# Patient Record
Sex: Male | Born: 1960 | Race: White | Hispanic: No | Marital: Single | Smoking: Former smoker
Health system: Southern US, Community
[De-identification: ages and names within clinical notes are randomized; demographics above are authoritative.]

## PROBLEM LIST (undated history)

## (undated) DIAGNOSIS — K635 Polyp of colon: Secondary | ICD-10-CM

## (undated) DIAGNOSIS — A630 Anogenital (venereal) warts: Secondary | ICD-10-CM

## (undated) DIAGNOSIS — R002 Palpitations: Secondary | ICD-10-CM

## (undated) DIAGNOSIS — J189 Pneumonia, unspecified organism: Secondary | ICD-10-CM

## (undated) DIAGNOSIS — M1990 Unspecified osteoarthritis, unspecified site: Secondary | ICD-10-CM

## (undated) DIAGNOSIS — F329 Major depressive disorder, single episode, unspecified: Secondary | ICD-10-CM

## (undated) DIAGNOSIS — I10 Essential (primary) hypertension: Secondary | ICD-10-CM

## (undated) DIAGNOSIS — G629 Polyneuropathy, unspecified: Secondary | ICD-10-CM

## (undated) DIAGNOSIS — E079 Disorder of thyroid, unspecified: Secondary | ICD-10-CM

## (undated) DIAGNOSIS — T7840XA Allergy, unspecified, initial encounter: Secondary | ICD-10-CM

## (undated) DIAGNOSIS — F32A Depression, unspecified: Secondary | ICD-10-CM

## (undated) DIAGNOSIS — M25569 Pain in unspecified knee: Secondary | ICD-10-CM

## (undated) DIAGNOSIS — F419 Anxiety disorder, unspecified: Secondary | ICD-10-CM

## (undated) DIAGNOSIS — S8990XA Unspecified injury of unspecified lower leg, initial encounter: Secondary | ICD-10-CM

## (undated) DIAGNOSIS — Z9289 Personal history of other medical treatment: Secondary | ICD-10-CM

## (undated) DIAGNOSIS — K219 Gastro-esophageal reflux disease without esophagitis: Secondary | ICD-10-CM

## (undated) HISTORY — PX: HERNIA REPAIR: SHX51

## (undated) HISTORY — DX: Disorder of thyroid, unspecified: E07.9

## (undated) HISTORY — PX: THYROIDECTOMY: SHX17

## (undated) HISTORY — PX: TOTAL HIP ARTHROPLASTY: SHX124

## (undated) HISTORY — PX: WRIST SURGERY: SHX841

## (undated) HISTORY — DX: Allergy, unspecified, initial encounter: T78.40XA

## (undated) HISTORY — PX: OTHER SURGICAL HISTORY: SHX169

## (undated) HISTORY — PX: KNEE ARTHROSCOPY: SHX127

## (undated) HISTORY — PX: COLONOSCOPY: SHX174

## (undated) HISTORY — PX: SHOULDER ARTHROSCOPY W/ ROTATOR CUFF REPAIR: SHX2400

## (undated) HISTORY — PX: ROTATOR CUFF REPAIR: SHX139

## (undated) HISTORY — DX: Essential (primary) hypertension: I10

---

## 2004-11-28 ENCOUNTER — Ambulatory Visit (HOSPITAL_COMMUNITY): Admission: RE | Admit: 2004-11-28 | Discharge: 2004-11-28 | Payer: Self-pay | Admitting: General Surgery

## 2011-08-28 ENCOUNTER — Other Ambulatory Visit: Payer: Self-pay | Admitting: Family Medicine

## 2011-08-28 DIAGNOSIS — Z801 Family history of malignant neoplasm of trachea, bronchus and lung: Secondary | ICD-10-CM

## 2011-08-28 DIAGNOSIS — J984 Other disorders of lung: Secondary | ICD-10-CM

## 2011-08-28 DIAGNOSIS — F17200 Nicotine dependence, unspecified, uncomplicated: Secondary | ICD-10-CM

## 2011-09-01 ENCOUNTER — Ambulatory Visit
Admission: RE | Admit: 2011-09-01 | Discharge: 2011-09-01 | Disposition: A | Discharge status: Home / Self Care | Payer: 59 | Source: Ambulatory Visit | Attending: Family Medicine | Admitting: Family Medicine

## 2011-09-01 DIAGNOSIS — J984 Other disorders of lung: Secondary | ICD-10-CM

## 2011-09-01 DIAGNOSIS — F17200 Nicotine dependence, unspecified, uncomplicated: Secondary | ICD-10-CM

## 2011-09-01 DIAGNOSIS — Z801 Family history of malignant neoplasm of trachea, bronchus and lung: Secondary | ICD-10-CM

## 2011-09-01 MED ORDER — IOHEXOL 300 MG/ML  SOLN
75.0000 mL | Freq: Once | INTRAMUSCULAR | Status: AC | PRN
  Administered 2011-09-01: 75 mL via INTRAVENOUS

## 2012-01-20 ENCOUNTER — Other Ambulatory Visit: Payer: Self-pay | Admitting: Physician Assistant

## 2012-02-21 ENCOUNTER — Other Ambulatory Visit: Payer: Self-pay | Admitting: Physician Assistant

## 2012-02-22 ENCOUNTER — Other Ambulatory Visit: Payer: Self-pay | Admitting: Family Medicine

## 2012-02-22 MED ORDER — LISINOPRIL 10 MG PO TABS: 10.0000 mg | ORAL_TABLET | Freq: Every day | ORAL | Status: DC

## 2012-03-31 ENCOUNTER — Other Ambulatory Visit: Payer: Self-pay | Admitting: Physician Assistant

## 2012-04-08 ENCOUNTER — Encounter: Payer: Self-pay | Admitting: Family Medicine

## 2012-04-08 ENCOUNTER — Ambulatory Visit (INDEPENDENT_AMBULATORY_CARE_PROVIDER_SITE_OTHER): Payer: 59 | Admitting: Family Medicine

## 2012-04-08 DIAGNOSIS — T148 Other injury of unspecified body region: Secondary | ICD-10-CM

## 2012-04-08 DIAGNOSIS — J301 Allergic rhinitis due to pollen: Secondary | ICD-10-CM

## 2012-04-08 DIAGNOSIS — E039 Hypothyroidism, unspecified: Secondary | ICD-10-CM

## 2012-04-08 DIAGNOSIS — W57XXXA Bitten or stung by nonvenomous insect and other nonvenomous arthropods, initial encounter: Secondary | ICD-10-CM

## 2012-04-08 DIAGNOSIS — I10 Essential (primary) hypertension: Secondary | ICD-10-CM

## 2012-04-08 MED ORDER — SYNTHROID 175 MCG PO TABS: 175.0000 ug | ORAL_TABLET | Freq: Every day | ORAL | Status: DC

## 2012-04-08 MED ORDER — LISINOPRIL 10 MG PO TABS: 10.0000 mg | ORAL_TABLET | Freq: Every day | ORAL | Status: DC

## 2012-04-08 MED ORDER — DOXYCYCLINE HYCLATE 100 MG PO TABS: 100.0000 mg | ORAL_TABLET | Freq: Two times a day (BID) | ORAL | Status: AC

## 2012-04-08 NOTE — Patient Instructions (Signed)
Wood Tick Bite Ticks are insects that attach themselves to the skin. Most tick bites are harmless, but sometimes ticks carry diseases that can make a person quite ill. The chance of getting ill depends on:  The kind of tick that bites you.   Time of year.   How long the tick is attached.   Geographic location.  Wood ticks are also called dog ticks. They are generally black. They can have white markings. They live in shrubs and grassy areas. They are larger than deer ticks. Wood ticks are about the size of a watermelon seed. They have a hard body. The most common places for ticks to attach themselves are the scalp, neck, armpits, waist, and groin. Wood ticks may stay attached for up to 2 weeks. TICKS MUST BE REMOVED AS SOON AS POSSIBLE TO HELP PREVENT DISEASES CAUSED BY TICK BITES.  TO REMOVE A TICK: 1. If available, put on latex gloves before trying to remove a tick.  2. Grasp the tick as close to the skin as possible, with curved forceps, fine tweezers or a special tick removal tool.  3. Pull gently with steady pressure until the tick lets go. Do not twist the tick or jerk it suddenly. This may break off the tick's head or mouth parts.  4. Do not crush the tick's body. This could force disease-carrying fluids from the tick into your body.  5. After the tick is removed, wash the bite area and your hands with soap and water or other disinfectant.  6. Apply a small amount of antiseptic cream or ointment to the bite site.  7. Wash and disinfect any instruments that were used.  8. Save the tick in a jar or plastic bag for later identification. Preserve the tick with a bit of alcohol or put it in the freezer.  9. Do not apply a hot match, petroleum jelly, or fingernail polish to the tick. This does not work and may increase the chances of disease from the tick bite.  YOU MAY NEED TO SEE YOUR CAREGIVER FOR A TETANUS SHOT NOW IF:  You have no idea when you had the last one.   You have never had a  tetanus shot before.  If you need a tetanus shot, and you decide not to get one, there is a rare chance of getting tetanus. Sickness from tetanus can be serious. If you get a tetanus shot, your arm may swell, get red and warm to the touch at the shot site. This is common and not a problem. PREVENTION  Wear protective clothing. Long sleeves and pants are best.   Wear white clothes to see ticks more easily   Tuck your pant legs into your socks.   If walking on trail, stay in the middle of the trail to avoid brushing against bushes.   Put insect repellent on all exposed skin and along boot tops, pant legs and sleeve cuffs   Check clothing, hair and skin repeatedly and before coming inside.   Brush off any ticks that are not attached.  SEEK MEDICAL CARE IF:   You cannot remove a tick or part of the tick that is left in the skin.   Unexplained fever.   Redness and swelling in the area of the tick bite.   Tender, swollen lymph glands.   Diarrhea.   Weight loss.   Cough.   Fatigue.   Muscle, joint or bone pain.   Belly pain.   Headache.   Rash.    SEEK IMMEDIATE MEDICAL CARE IF:   You develop an oral temperature above 102 F (38.9 C).   You are having trouble walking or moving your legs.   Numbness in the legs.   Shortness of breath.   Confusion.   Repeated vomiting.  Document Released: 12/04/2000 Document Revised: 11/26/2011 Document Reviewed: 11/12/2008 Exit[employer] Patient Information 2012 [employer]. 

## 2012-04-09 LAB — COMPREHENSIVE METABOLIC PANEL
Albumin: 4.3 g/dL (ref 3.5–5.2)
Alkaline Phosphatase: 49 U/L (ref 39–117)
BUN: 12 mg/dL (ref 6–23)
CO2: 27 mEq/L (ref 19–32)
Calcium: 9.3 mg/dL (ref 8.4–10.5)
Chloride: 104 mEq/L (ref 96–112)
Creat: 0.98 mg/dL (ref 0.50–1.35)
Glucose, Bld: 82 mg/dL (ref 70–99)
Potassium: 4 mEq/L (ref 3.5–5.3)
Sodium: 138 mEq/L (ref 135–145)
Total Bilirubin: 0.4 mg/dL (ref 0.3–1.2)

## 2012-04-09 LAB — TSH: TSH: 0.731 u[IU]/mL (ref 0.350–4.500)

## 2012-04-12 ENCOUNTER — Encounter: Payer: Self-pay | Admitting: Family Medicine

## 2012-04-12 DIAGNOSIS — E663 Overweight: Secondary | ICD-10-CM | POA: Insufficient documentation

## 2012-04-12 DIAGNOSIS — Z87891 Personal history of nicotine dependence: Secondary | ICD-10-CM | POA: Insufficient documentation

## 2012-04-12 DIAGNOSIS — Z8719 Personal history of other diseases of the digestive system: Secondary | ICD-10-CM | POA: Insufficient documentation

## 2012-04-12 DIAGNOSIS — E039 Hypothyroidism, unspecified: Secondary | ICD-10-CM | POA: Insufficient documentation

## 2012-04-12 DIAGNOSIS — I10 Essential (primary) hypertension: Secondary | ICD-10-CM | POA: Insufficient documentation

## 2012-04-12 NOTE — Progress Notes (Signed)
  Subjective:    Patient ID: Philip Humphrey, male    DOB: Nov 09, 1961, 51 y.o.   MRN: [id number]  HPI   This 51 y.o Cauc male returns for HTN f/u and medication RFs. He is compliant with meds without  side effects. He was last seen here in Sept 2012 for acute illness. BP at that time was 116/80, HR=101.  He has a hx of palpitations with multiple evaluations in the past. He is Hypothyroid since age 51 (underwent  an inadvertent Thyroidectomy at age 21). He is under some stress because his wife has Lupus and has  recently been taken off all medications due to elevated LFTs. He also c/o insect (spider) bite that occurred  while at work; he works for [employer] and is outdoors all the time. The lesion is in the left groin area  which stays moist with sweat and will not heal completely. He has some allergic symptoms related to his work outdoors.  Review of Systems  Constitutional: Negative.   HENT: Negative.   Eyes: Negative.   Respiratory: Negative for cough, chest tightness and shortness of breath.   Cardiovascular: Negative for chest pain, palpitations and leg swelling.  Musculoskeletal: Negative for myalgias, joint swelling and arthralgias.  Skin:       Insect bite in left groin  Hematological: Negative.        Objective:   Physical Exam  Nursing note and vitals reviewed. Constitutional: He is oriented to person, place, and time. He appears well-developed and well-nourished. No distress.  HENT:  Head: Normocephalic and atraumatic.  Right Ear: External ear normal.  Left Ear: External ear normal.  Mouth/Throat: Oropharynx is clear and moist.  Eyes: Conjunctivae and EOM are normal. No scleral icterus.  Neck: Neck supple. No thyromegaly present.  Cardiovascular: Normal rate, regular rhythm and normal heart sounds.  Exam reveals no gallop.   No murmur heard. Pulmonary/Chest: Effort normal and breath sounds normal. No respiratory distress.  Musculoskeletal: Normal range of motion.    Neurological: He is alert and oriented to person, place, and time. No cranial nerve deficit.  Skin: Rash noted.       Left groin: 1-2 mm red shallow ulcerated lesion with no swelling , drainage or significant surrounding erythema  Psychiatric: He has a normal mood and affect. His behavior is normal. Thought content normal.          Assessment & Plan:   1. Allergic rhinitis  Get OTC antihistamine and take it daily  2. HTN (hypertension)  Comprehensive metabolic panel RF: Lisinopril 10 mg; healthy nutrition, stress reduction  3. Unspecified hypothyroidism  TSH RF: Synthroid 175 mcg   4. Tick bites  RX: Doxycycline 100 mg  1 tab bid x 10 days; keep area dry

## 2012-04-12 NOTE — Progress Notes (Signed)
Quick Note:  Please notify pt that results are normal.   Provide pt with copy of labs. ______ 

## 2012-04-20 ENCOUNTER — Encounter: Payer: Self-pay | Admitting: Internal Medicine

## 2012-07-08 ENCOUNTER — Ambulatory Visit: Payer: 59 | Admitting: Family Medicine

## 2012-07-08 VITALS — BP 138/94 | HR 108 | Temp 98.6°F | Resp 18 | Ht 69.0 in | Wt 252.0 lb

## 2012-07-08 DIAGNOSIS — F341 Dysthymic disorder: Secondary | ICD-10-CM

## 2012-07-08 DIAGNOSIS — F329 Major depressive disorder, single episode, unspecified: Secondary | ICD-10-CM

## 2012-07-08 DIAGNOSIS — F419 Anxiety disorder, unspecified: Secondary | ICD-10-CM

## 2012-07-08 DIAGNOSIS — F32A Depression, unspecified: Secondary | ICD-10-CM

## 2012-07-08 MED ORDER — LORAZEPAM 1 MG PO TABS: ORAL_TABLET | ORAL | Status: DC

## 2012-07-08 MED ORDER — SERTRALINE HCL 50 MG PO TABS: ORAL_TABLET | ORAL | Status: DC

## 2012-07-08 NOTE — Patient Instructions (Signed)
Return if worse Recheck 4-6 weeks Exercise
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
## 2013-06-26 ENCOUNTER — Other Ambulatory Visit: Payer: Self-pay | Admitting: Family Medicine

## 2013-06-29 ENCOUNTER — Telehealth: Payer: Self-pay

## 2013-06-29 MED ORDER — LISINOPRIL 10 MG PO TABS: 10.0000 mg | ORAL_TABLET | Freq: Every day | ORAL | Status: DC

## 2013-06-29 NOTE — Telephone Encounter (Signed)
Sent in 15 d supply until he can come in.

## 2013-06-29 NOTE — Telephone Encounter (Signed)
Patient needs lisinopril refill. [facility] [address] [phone]

## 2013-07-13 ENCOUNTER — Ambulatory Visit: Payer: 59 | Admitting: Family Medicine

## 2013-07-13 VITALS — BP 120/82 | HR 98 | Temp 98.2°F | Resp 18 | Ht 70.0 in | Wt 229.0 lb

## 2013-07-13 DIAGNOSIS — M7712 Lateral epicondylitis, left elbow: Secondary | ICD-10-CM

## 2013-07-13 DIAGNOSIS — E039 Hypothyroidism, unspecified: Secondary | ICD-10-CM

## 2013-07-13 DIAGNOSIS — I10 Essential (primary) hypertension: Secondary | ICD-10-CM

## 2013-07-13 DIAGNOSIS — M7710 Lateral epicondylitis, unspecified elbow: Secondary | ICD-10-CM

## 2013-07-13 DIAGNOSIS — E538 Deficiency of other specified B group vitamins: Secondary | ICD-10-CM

## 2013-07-13 DIAGNOSIS — Z79899 Other long term (current) drug therapy: Secondary | ICD-10-CM

## 2013-07-13 DIAGNOSIS — N3943 Post-void dribbling: Secondary | ICD-10-CM

## 2013-07-13 LAB — COMPREHENSIVE METABOLIC PANEL
ALT: 28 U/L (ref 0–53)
AST: 29 U/L (ref 0–37)
Albumin: 4.7 g/dL (ref 3.5–5.2)
Alkaline Phosphatase: 44 U/L (ref 39–117)
BUN: 17 mg/dL (ref 6–23)
CO2: 26 mEq/L (ref 19–32)
Calcium: 9.6 mg/dL (ref 8.4–10.5)
Chloride: 103 mEq/L (ref 96–112)
Creat: 1.22 mg/dL (ref 0.50–1.35)
Glucose, Bld: 86 mg/dL (ref 70–99)
Potassium: 4.6 mEq/L (ref 3.5–5.3)
Sodium: 138 mEq/L (ref 135–145)
Total Bilirubin: 0.5 mg/dL (ref 0.3–1.2)
Total Protein: 7.3 g/dL (ref 6.0–8.3)

## 2013-07-13 LAB — PSA: PSA: 0.47 ng/mL (ref <=4.00)

## 2013-07-13 LAB — POCT CBC
Granulocyte percent: 69 %G (ref 37–80)
HCT, POC: 47.7 % (ref 43.5–53.7)
Hemoglobin: 15.4 g/dL (ref 14.1–18.1)
Lymph, poc: 1.8 (ref 0.6–3.4)
MCH, POC: 32.6 pg — AB (ref 27–31.2)
MCHC: 32.3 g/dL (ref 31.8–35.4)
MCV: 100.9 fL — AB (ref 80–97)
MID (cbc): 0.6 (ref 0–0.9)
MPV: 9 fL (ref 0–99.8)
POC Granulocyte: 5.4 (ref 2–6.9)
POC LYMPH PERCENT: 23.3 %L (ref 10–50)
POC MID %: 7.7 %M (ref 0–12)
Platelet Count, POC: 211 10*3/uL (ref 142–424)
RBC: 4.73 M/uL (ref 4.69–6.13)
RDW, POC: 13.2 %
WBC: 7.8 10*3/uL (ref 4.6–10.2)

## 2013-07-13 LAB — VITAMIN B12: Vitamin B-12: 271 pg/mL (ref 211–911)

## 2013-07-13 LAB — TSH: TSH: 7.429 u[IU]/mL — ABNORMAL HIGH (ref 0.350–4.500)

## 2013-07-13 MED ORDER — LISINOPRIL 10 MG PO TABS: 10.0000 mg | ORAL_TABLET | Freq: Every day | ORAL | Status: DC

## 2013-07-13 MED ORDER — DICLOFENAC SODIUM 1 % TD GEL: 4.0000 g | Freq: Four times a day (QID) | TRANSDERMAL | Status: DC

## 2013-07-13 MED ORDER — SYNTHROID 175 MCG PO TABS: 175.0000 ug | ORAL_TABLET | Freq: Every day | ORAL | Status: DC

## 2013-07-13 MED ORDER — OMEPRAZOLE 40 MG PO CPDR: 40.0000 mg | DELAYED_RELEASE_CAPSULE | Freq: Every day | ORAL | Status: DC

## 2013-07-13 NOTE — Progress Notes (Signed)
Subjective:    Patient ID: Philip Humphrey, male    DOB: 1961/10/23, 52 y.o.   MRN: [id number] Chief Complaint  Patient presents with  . rx refills   HPI  Prev saw Dr. Timothy Lasso and was getting B12 shots - didn't see any benefit from getting the shots, no changes in energy, joint pains, etc.  Now occ palpitations but not near as frequent as prev.  Often get really bad when his thyroid med dose needs adjusting.  Has left lateral epicondylitis for sev months. 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 Bought a great strap from pharmacy but simply absolutely cannot wear it at work under the rubber - esp if he is up working on the wires.  Wears it at home and if he on the ground working. Lateral 2 fingers keep going to sleep at night - sees Dr. Orlan Leavens for left wrist surgery in 2008 ulnar aspect and then last yr tore a tendon on ulner side of hand. Wife had voltarn gel which helped a lot but ran out.  Sees Dr. Kinnie Scales for colonoscopy but no other drs. Has never had prostate checked. Not fasting today.  Wife had breast cancer this yr - s/p double mastectomy.  Tried zoloft last yr but just made him feel sleepy. Doesn't feel like he needs any mood meds currently - doing well.   Past Medical History  Diagnosis Date  . Allergy   . Hypertension   . Thyroid disease     Thyroid removed unintentionally at age 60   No current outpatient prescriptions on file prior to visit.   No current facility-administered medications on file prior to visit.   Allergies  Allergen Reactions  . Oxycodone Itching  . Sulfa Antibiotics Swelling    MOUTH AND THROAT     Review of Systems  Constitutional: Negative for fever and chills.  Eyes: Negative for visual disturbance.  Respiratory: Negative for shortness of breath.   Cardiovascular: Positive for palpitations. Negative for chest pain and leg swelling.  Musculoskeletal: Positive for  myalgias and arthralgias.  Skin: Negative for rash.  Neurological: Positive for numbness. Negative for dizziness, syncope, facial asymmetry, weakness, light-headedness and headaches.  Psychiatric/Behavioral: Negative for sleep disturbance and dysphoric mood. The patient is not nervous/anxious.       BP 120/82  Pulse 98  Temp(Src) 98.2 F (36.8 C) (Oral)  Resp 18  Ht 5\' 10"  (1.778 m)  Wt 229 lb (103.874 kg)  BMI 32.86 kg/m2  SpO2 97% Objective:   Physical Exam  Constitutional: He is oriented to person, place, and time. He appears well-developed and well-nourished. No distress.  HENT:  Head: Normocephalic and atraumatic.  Eyes: Conjunctivae are normal. Pupils are equal, round, and reactive to light. No scleral icterus.  Neck: Normal range of motion. Neck supple. No thyromegaly present.  Cardiovascular: Normal rate, regular rhythm, normal heart sounds and intact distal pulses.   Pulmonary/Chest: Effort normal and breath sounds normal. No respiratory distress.  Musculoskeletal: He exhibits no edema.  Lymphadenopathy:    He has no cervical adenopathy.  Neurological: He is alert and oriented to person, place, and time.  Skin: Skin is warm and dry. He is not diaphoretic.  Psychiatric: He has a normal mood and affect. His behavior is normal.      Results for orders placed in visit on 07/13/13  POCT CBC      Result Value Range  WBC 7.8  4.6 - 10.2 K/uL   Lymph, poc 1.8  0.6 - 3.4   POC LYMPH PERCENT 23.3  10 - 50 %L   MID (cbc) 0.6  0 - 0.9   POC MID % 7.7  0 - 12 %M   POC Granulocyte 5.4  2 - 6.9   Granulocyte percent 69.0  37 - 80 %G   RBC 4.73  4.69 - 6.13 M/uL   Hemoglobin 15.4  14.1 - 18.1 g/dL   HCT, POC 11.9  14.7 - 53.7 %   MCV 100.9 (*) 80 - 97 fL   MCH, POC 32.6 (*) 27 - 31.2 pg   MCHC 32.3  31.8 - 35.4 g/dL   RDW, POC 82.9     Platelet Count, POC 211  142 - 424 K/uL   MPV 9.0  0 - 99.8 fL    Assessment & Plan:  Hypothyroidism - Plan: TSH  HTN (hypertension)  - Plan: Comprehensive metabolic panel, POCT CBC  Vitamin B12 deficiency - Plan: Vitamin B12  Tennis elbow syndrome, left - cont voltaren gel. Wear band as often as possible. If worsens, f/u w/ Dr. Orlan Leavens to cons injection or other.  Urinary dribbling - Plan: PSA  Encounter for long-term (current) use of other medications - Plan: POCT CBC  Rec FASTING at next visit for lipid panel. Would benefit from CPE at next OV.  Meds ordered this encounter  Medications  . Cetirizine HCl (ZYRTEC ALLERGY PO)    Sig: Take by mouth.  Marland Kitchen lisinopril (PRINIVIL,ZESTRIL) 10 MG tablet    Sig: Take 1 tablet (10 mg total) by mouth daily.    Dispense:  90 tablet    Refill:  3  . omeprazole (PRILOSEC) 40 MG capsule    Sig: Take 1 capsule (40 mg total) by mouth daily.    Dispense:  90 capsule    Refill:  3  . SYNTHROID 175 MCG tablet    Sig: Take 1 tablet (175 mcg total) by mouth daily.    Dispense:  90 tablet    Refill:  3  . diclofenac sodium (VOLTAREN) 1 % GEL    Sig: Apply 4 g topically 4 (four) times daily.    Dispense:  100 g    Refill:  5

## 2013-07-17 NOTE — Progress Notes (Signed)
PA approved for Voltaren Gel through 07/15/15. Notified pharm.

## 2013-08-03 ENCOUNTER — Ambulatory Visit: Payer: 59 | Admitting: Family Medicine

## 2013-08-03 VITALS — BP 122/70 | HR 93 | Temp 98.3°F | Resp 18 | Ht 70.0 in | Wt 231.0 lb

## 2013-08-03 DIAGNOSIS — J209 Acute bronchitis, unspecified: Secondary | ICD-10-CM

## 2013-08-03 DIAGNOSIS — J00 Acute nasopharyngitis [common cold]: Secondary | ICD-10-CM

## 2013-08-03 MED ORDER — BENZONATATE 100 MG PO CAPS: 100.0000 mg | ORAL_CAPSULE | Freq: Three times a day (TID) | ORAL | Status: DC | PRN

## 2013-08-03 MED ORDER — ALBUTEROL SULFATE HFA 108 (90 BASE) MCG/ACT IN AERS: 2.0000 | INHALATION_SPRAY | Freq: Four times a day (QID) | RESPIRATORY_TRACT | Status: DC | PRN

## 2013-08-03 MED ORDER — AZITHROMYCIN 250 MG PO TABS: ORAL_TABLET | ORAL | Status: DC

## 2013-08-03 NOTE — Patient Instructions (Addendum)
Let us know if you are not better in the next few days- Sooner if worse.   Keep an eye on your temperature as well- let me know if any fever.

## 2013-08-03 NOTE — Progress Notes (Signed)
[facility] [address], [place] [address] [phone]  Date:  08/03/2013   Name:  Philip Humphrey   DOB:  Apr 10, 1961   MRN:  [id number]  PCP:  Tonye Pearson, MD    Chief Complaint: cough, congestion, pressure in head   History of Present Illness:  Philip Humphrey is a 52 y.o. very pleasant male patient who presents with the following:  About 5 days ago he noted onset of sneezing, ST, sinus congestion, and productive cough.  Severel of his coworkers have been diagnosed with "walking pneumonia."  He has not measured a fever, but has felt hot and cold.  His cough can be productive of white colored phlegm.   He has coughed so much that his trunk hurts, he has had post- tussive emesis.   He had a "little bit of diarrhea" a few days ago.   He tried to RTW today but felt too bad to work.    He has a history of HTN and hypothyroidism.  Takes a low dose of lisinopril.   States he had a similar illness several months ago which responded well to "a z-pack"   Patient Active Problem List   Diagnosis Date Noted  . Hypothyroidism 04/12/2012  . HTN (hypertension) 04/12/2012  . History of gastritis 04/12/2012  . History of tobacco use 04/12/2012  . Overweight 04/12/2012    Past Medical History  Diagnosis Date  . Allergy   . Hypertension   . Thyroid disease     Thyroid removed unintentionally at age 62    History reviewed. No pertinent past surgical history.  History  Substance Use Topics  . Smoking status: Former Smoker    Types: Cigars    Quit date: 08/22/2011  . Smokeless tobacco: Not on file     Comment: RECREATIONAL  . Alcohol Use: Yes     Comment: OCCASIONALLY BEER    Family History  Problem Relation Age of Onset  . Cancer Mother     Lung  . Heart disease Mother   . Cancer Brother     Lung  . Lung disease Brother   . Stroke Father     Allergies  Allergen Reactions  . Oxycodone Itching  . Sulfa Antibiotics Swelling   MOUTH AND THROAT    Medication list has been reviewed and updated.  Current Outpatient Prescriptions on File Prior to Visit  Medication Sig Dispense Refill  . Cetirizine HCl (ZYRTEC ALLERGY PO) Take by mouth.      . diclofenac sodium (VOLTAREN) 1 % GEL Apply 4 g topically 4 (four) times daily.  100 g  5  . lisinopril (PRINIVIL,ZESTRIL) 10 MG tablet Take 1 tablet (10 mg total) by mouth daily.  90 tablet  3  . omeprazole (PRILOSEC) 40 MG capsule Take 1 capsule (40 mg total) by mouth daily.  90 capsule  3  . SYNTHROID 175 MCG tablet Take 1 tablet (175 mcg total) by mouth daily.  90 tablet  3   No current facility-administered medications on file prior to visit.    Review of Systems:  As per HPI- otherwise negative.   Physical Examination: Filed Vitals:   08/03/13 1558  BP: 122/70  Pulse: 93  Temp: 98.3 F (36.8 C)  Resp: 18   Filed Vitals:   08/03/13 1558  Height: 5\' 10"  (1.778 m)  Weight: 231 lb (104.781 kg)   Body mass index is 33.15 kg/(m^2). Ideal Body Weight: Weight in (lb) to  have BMI = 25: 173.9  GEN: WDWN, NAD, Non-toxic, A & O x 3, overweight HEENT: Atraumatic, Normocephalic. Neck supple. No masses, No LAD.  Bilateral TM wnl, oropharynx normal.  PEERL,EOMI.  Nasal cavity erythematous but not congested.  No sinus tenderness.   Ears and Nose: No external deformity. CV: RRR, No M/G/R. No JVD. No thrill. No extra heart sounds. PULM: mild wheezes and rhonchi bilaterally.  No retractions. No resp. distress. No accessory muscle use. ABD: S, NT, ND, +BS. No rebound. No HSM. EXTR: No c/c/e NEURO Normal gait.  PSYCH: Normally interactive. Conversant. Not depressed or anxious appearing.  Calm demeanor.   Assessment and Plan: Acute bronchitis - Plan: azithromycin (ZITHROMAX) 250 MG tablet, benzonatate (TESSALON) 100 MG capsule, albuterol (PROVENTIL HFA;VENTOLIN HFA) 108 (90 BASE) MCG/ACT inhaler  Treat for bronchitis as above.  Recent K level normal.  Let me know if not  better soon- Sooner if worse.     Signed Abbe Amsterdam, MD

## 2013-08-07 ENCOUNTER — Telehealth: Payer: Self-pay

## 2013-08-07 NOTE — Telephone Encounter (Signed)
PT STATES HE WAS SEEN FOR BRONCHITIS AND GIVEN A Z-PAK AND INHALER. HE ISN'T ANY BETTER AND WAS TOLD BY CO-WORKERS HE MAY HAVE PNEUMONIA NEED TO HAVE SOMETHING ELSE CALLED IN. PLEASE CALL [phone], STATES HE HAVE COUGHED SO MUCH UNTIL HIS RIBS HURT    [facility]

## 2013-08-08 ENCOUNTER — Encounter: Payer: Self-pay | Admitting: Family Medicine

## 2013-08-08 NOTE — Telephone Encounter (Signed)
RTC

## 2013-08-08 NOTE — Telephone Encounter (Signed)
Called him back- seen on 8/14 and treated with azithromycin, tessalon and albuterol.  He coughed up a bunch of material over the last couple of days and now does feel better.  He still feels some congestion in his head and chest, and wonders if he needs another antibiotic No fever that he has noted.  He still feels tired.  Asked him to please come in for a recheck.  He will do so if he feels it is necessary.

## 2013-12-20 ENCOUNTER — Ambulatory Visit (INDEPENDENT_AMBULATORY_CARE_PROVIDER_SITE_OTHER): Payer: 59 | Admitting: Physician Assistant

## 2013-12-20 VITALS — BP 128/74 | HR 83 | Temp 98.0°F | Resp 18 | Ht 69.0 in | Wt 241.0 lb

## 2013-12-20 DIAGNOSIS — J40 Bronchitis, not specified as acute or chronic: Secondary | ICD-10-CM

## 2013-12-20 DIAGNOSIS — R059 Cough, unspecified: Secondary | ICD-10-CM

## 2013-12-20 DIAGNOSIS — R05 Cough: Secondary | ICD-10-CM

## 2013-12-20 DIAGNOSIS — J9801 Acute bronchospasm: Secondary | ICD-10-CM

## 2013-12-20 MED ORDER — AZITHROMYCIN 250 MG PO TABS: ORAL_TABLET | ORAL | Status: DC

## 2013-12-20 MED ORDER — PREDNISONE 20 MG PO TABS: ORAL_TABLET | ORAL | Status: DC

## 2013-12-20 MED ORDER — HYDROCODONE-HOMATROPINE 5-1.5 MG/5ML PO SYRP: ORAL_SOLUTION | ORAL | Status: DC

## 2013-12-20 NOTE — Progress Notes (Signed)
Subjective:    Patient ID: Philip Humphrey, male    DOB: Oct 14, 1961, 52 y.o.   MRN: [id number]  HPI 52 year old male presents with 6 day history of cough, congestion, rhinorrhea, sinus pressure, post nasal drip, fever, chills, and fatigue. Subjective fever and chills. Cough is productive of green sputum and seems to be worse at night keeping him awake at nighttime. He has been sleeping in a recliner over the past week, but is now able to go back to sleeping in his own bed the past couple of nights in a supine position. Some SOB and wheezing. He feels like he is on the up swing currently but is cautious secondary to his wife's immunocompromised state. He did have some diarrhea earlier in the course but that seems to be clearing up as well.    PMH: Past Medical History  Diagnosis Date  . Allergy   . Hypertension   . Thyroid disease     Thyroid removed unintentionally at age 24   Allergies: Allergies  Allergen Reactions  . Oxycodone Itching  . Sulfa Antibiotics Swelling    MOUTH AND THROAT   History   Social History  . Marital Status: Married    Spouse Name: N/A    Number of Children: N/A  . Years of Education: N/A   Occupational History  . Not on file.   Social History Main Topics  . Smoking status: Former Smoker    Types: Cigars    Quit date: 08/22/2011  . Smokeless tobacco: Not on file     Comment: RECREATIONAL  . Alcohol Use: Yes     Comment: OCCASIONALLY BEER  . Drug Use: Not on file  . Sexual Activity: Not on file   Other Topics Concern  . Not on file   Social History Narrative  . No narrative on file   Family History  Problem Relation Age of Onset  . Cancer Mother     Lung  . Heart disease Mother   . Cancer Brother     Lung  . Lung disease Brother   . Stroke Father      Review of Systems  Constitutional: Positive for fever, chills and fatigue. Negative for appetite change.  HENT: Positive for congestion, postnasal drip, rhinorrhea, sinus  pressure, sore throat and tinnitus. Negative for ear pain, hearing loss, sneezing, trouble swallowing and voice change.   Respiratory: Positive for cough, shortness of breath and wheezing.   Gastrointestinal: Positive for diarrhea. Negative for nausea and vomiting.  Neurological: Positive for headaches.       Objective:   Physical Exam  Physical Exam: Blood pressure 128/74, pulse 83, temperature 98 F (36.7 C), temperature source Oral, resp. rate 18, height 5\' 9"  (1.753 m), weight 241 lb (109.317 kg), SpO2 96.00%., Body mass index is 35.57 kg/(m^2). General: Well developed, well nourished, in no acute distress. Head: Normocephalic, atraumatic, eyes without discharge, sclera non-icteric, nares are congested. Bilateral auditory canals clear, TM's are without perforation, pearly grey with reflective cone of light bilaterally. No sinus TTP. Oral cavity moist, dentition normal. Posterior pharynx with post nasal drip and mild erythema. No peritonsillar abscess or tonsillar exudate. Uvula midline.  Neck: Supple. No thyromegaly. Full ROM. Lymph nodes: less than 2 cm AC bilaterally. Lungs: Coarse breath sounds bilaterally without wheezes, rales, or rhonchi. Breathing is unlabored.  Heart: RRR with S1 S2. No murmurs, rubs, or gallops appreciated. Msk:  Strength and tone normal for age. Extremities: No clubbing or cyanosis. No  edema. Neuro: Alert and oriented X 3. Moves all extremities spontaneously. CNII-XII grossly in tact. Psych:  Responds to questions appropriately with a normal affect.    Labs: Declines influenza swab      Assessment & Plan:  52 year male with bronchitis, RAD, and cough -Azithromycin 250 MG #6 2 po first day then 1 po next 4 days no RF -Prednisone 20 mg #12 3x2, 2x2, 1x2 no RF -Hycodan #4oz 1 tsp po q 4-6 hours prn cough no RF SED, states he tolerates this. He just took some recently for his recent wrist surgery. Advised patient to start with 1/2 tsp instead of 1 tsp.    -Has Proventil at home to use -Mucinex -Rest/fluids -RTC precautions   Eula Listen, PA-C [facility] [address] [place], [place] [address] [phone] 12/20/2013 1:36 PM

## 2014-01-03 ENCOUNTER — Telehealth: Payer: Self-pay

## 2014-01-03 NOTE — Telephone Encounter (Signed)
Patient wants to know if he can have a prescription written for Aldara cream .25mg . States that it has been several years since he used it but the same problem has come back. [place]  [phone]

## 2014-01-04 NOTE — Telephone Encounter (Signed)
Pt states he has used the cream 20 years ago and would like to try it again. Advised pt he needs to come in to be seen. Pt understands.

## 2014-05-28 ENCOUNTER — Encounter (HOSPITAL_COMMUNITY): Payer: Self-pay | Admitting: Emergency Medicine

## 2014-05-28 ENCOUNTER — Ambulatory Visit: Payer: 59

## 2014-05-28 ENCOUNTER — Emergency Department (HOSPITAL_COMMUNITY)
Admission: EM | Admit: 2014-05-28 | Discharge: 2014-05-28 | Disposition: A | Discharge status: Home / Self Care | Payer: Worker's Compensation | Attending: Emergency Medicine | Admitting: Emergency Medicine

## 2014-05-28 DIAGNOSIS — R519 Headache, unspecified: Secondary | ICD-10-CM

## 2014-05-28 DIAGNOSIS — Z792 Long term (current) use of antibiotics: Secondary | ICD-10-CM | POA: Diagnosis not present

## 2014-05-28 DIAGNOSIS — IMO0002 Reserved for concepts with insufficient information to code with codable children: Secondary | ICD-10-CM | POA: Insufficient documentation

## 2014-05-28 DIAGNOSIS — N429 Disorder of prostate, unspecified: Secondary | ICD-10-CM | POA: Diagnosis not present

## 2014-05-28 DIAGNOSIS — E079 Disorder of thyroid, unspecified: Secondary | ICD-10-CM | POA: Insufficient documentation

## 2014-05-28 DIAGNOSIS — Z87891 Personal history of nicotine dependence: Secondary | ICD-10-CM | POA: Insufficient documentation

## 2014-05-28 DIAGNOSIS — R300 Dysuria: Secondary | ICD-10-CM | POA: Insufficient documentation

## 2014-05-28 DIAGNOSIS — Z79899 Other long term (current) drug therapy: Secondary | ICD-10-CM | POA: Diagnosis not present

## 2014-05-28 DIAGNOSIS — R51 Headache: Secondary | ICD-10-CM | POA: Diagnosis present

## 2014-05-28 DIAGNOSIS — I10 Essential (primary) hypertension: Secondary | ICD-10-CM | POA: Insufficient documentation

## 2014-05-28 NOTE — ED Provider Notes (Signed)
CSN: [id number]     Arrival date & time 05/28/14  1609 History   First MD Initiated Contact with Patient 05/28/14 1837     Chief Complaint  Patient presents with  . [place] spotted fever      (Consider location/radiation/quality/duration/timing/severity/associated sxs/prior Treatment) HPI 53 year old male presents with a lab test positive for [place] spotted fever. His paperwork shows IgM at 1.13, which is positive for this lab test. One month ago, on April 30, the patient noticed several ticks on his body. He estimates over 15 and they were all removed. This was shortly after he was working in a grassy area working on Investment banker, operational of a bridge. He went it checked out the next day or 2 and was tested for [facility] spotted fever. These tests are coming back negative. He took at least one week course of doxycycline for him. He is then had one other test and a week later was negative. They planned to have one more lab test done last week. It was drawn and this morning he was told that his IgM was positive. He was told to come to the ER for evaluation. The patient relates she's had headaches nearly every day since he found the tics. That is her not to worsening and are moderate in pain. Patient denies any vision changes or vomiting. No weakness. Denies any rash. He is also currently being treated for prostatitis by Dr. Jasmine December of urology. He noticed pain in between his testicles and anus last week. He is on antibiotic but does not know the name. He also endorses some intermittent tingling and paresthesias in his lower tremors over last couple weeks. Is not sure if this related to the [facility] [place] spotted fever.  Past Medical History  Diagnosis Date  . Allergy   . Hypertension   . Thyroid disease     Thyroid removed unintentionally at age 66   History reviewed. No pertinent past surgical history. Family History  Problem Relation Age of Onset  . Cancer Mother     Lung   . Heart disease Mother   . Cancer Brother     Lung  . Lung disease Brother   . Stroke Father    History  Substance Use Topics  . Smoking status: Former Smoker    Types: Cigars    Quit date: 08/22/2011  . Smokeless tobacco: Not on file     Comment: RECREATIONAL  . Alcohol Use: Yes     Comment: OCCASIONALLY BEER    Review of Systems  Constitutional: Negative for fever.  Gastrointestinal: Negative for vomiting and abdominal pain.  Genitourinary: Positive for dysuria.       "prostate pain"  Musculoskeletal: Negative for back pain.  Skin: Negative for rash.  Neurological: Positive for headaches.  All other systems reviewed and are negative.     Allergies  Oxycodone and Sulfa antibiotics  Home Medications   Prior to Admission medications   Medication Sig Start Date End Date Taking? Authorizing Provider  azithromycin (ZITHROMAX Z-PAK) 250 MG tablet 2 tabs po first day, then 1 tab po next 4 days 12/20/13   Areta Haber Dunn, PA-C  HYDROcodone-homatropine [facility]) 5-1.5 MG/5ML syrup 1 TSP PO Q 4-6 HOURS PRN COUGH 12/20/13   Areta Haber Dunn, PA-C  lisinopril (PRINIVIL,ZESTRIL) 10 MG tablet Take 1 tablet (10 mg total) by mouth daily. 07/13/13   Shawnee Knapp, MD  omeprazole (PRILOSEC) 40 MG capsule Take 1 capsule (40 mg  total) by mouth daily. 07/13/13   Shawnee Knapp, MD  predniSONE (DELTASONE) 20 MG tablet 3 PO FOR 2 DAYS, 2 PO FOR 2 DAYS, 1 PO FOR 2 DAYS 12/20/13   Areta Haber Dunn, PA-C  SYNTHROID 175 MCG tablet Take 1 tablet (175 mcg total) by mouth daily. 07/13/13   Shawnee Knapp, MD   BP 141/100  Pulse 100  Temp(Src) 98.2 F (36.8 C)  Resp 20  SpO2 98% Physical Exam  Nursing note and vitals reviewed. Constitutional: He is oriented to person, place, and time. He appears well-developed and well-nourished.  HENT:  Head: Normocephalic and atraumatic.  Right Ear: External ear normal.  Left Ear: External ear normal.  Nose: Nose normal.  Eyes: EOM are normal. Pupils are equal, round, and  reactive to light. Right eye exhibits no discharge. Left eye exhibits no discharge.  Neck: Neck supple.  Cardiovascular: Normal rate, regular rhythm, normal heart sounds and intact distal pulses.   Pulmonary/Chest: Effort normal and breath sounds normal.  Abdominal: Soft. There is no tenderness.  Genitourinary: Right testis shows no mass and no tenderness. Left testis shows no mass and no tenderness.  Musculoskeletal: He exhibits no edema.  Neurological: He is alert and oriented to person, place, and time.  CN 2-12 grossly intact. 5/5 strength in all 4 extremities  Skin: Skin is warm and dry.    ED Course  Procedures (including critical care time) Labs Review Labs Reviewed - No data to display  Imaging Review No results found.   EKG Interpretation None      MDM   Final diagnoses:  Headache    Patient has benign exam here. Normal neuro testing. His IGG is negative, IGM is positive. Discussed with Dr. Johnnye Sima (ID on call), patient's IGM being elevated is not unexpected, and can be elevated for up to 6 months. Per him, does not need testing for clearance given he has been treated with doxy. As for his "prostate issues" it sounds like he is being followed with a urologist and is currently on antibiotics, but he does not know which. I offered him lab testing for his paresthesias, but given these are bilateral I have low suspicion for acute neurologic emergency. He declines as he has somewhere to be and will follow up with his PCP as an outpatient.     Ephraim Hamburger, MD 05/29/14 [phone]

## 2014-05-28 NOTE — ED Notes (Signed)
Pt was bite by 15 ticks back in April, went to urgent care on June 1 gave meds, doxycycline, for prophylaxis of [place] spotted fever. Found out today that the test was positive. Pt c/o weakness, headache and prostate pain.

## 2014-05-28 NOTE — ED Notes (Signed)
Patient ambulated to the bathroom with no problems.  

## 2014-05-28 NOTE — ED Notes (Signed)
Pt does not want to stay, MD made aware and will come to talk to pt.

## 2014-07-07 ENCOUNTER — Other Ambulatory Visit: Payer: Self-pay | Admitting: Family Medicine

## 2014-07-14 ENCOUNTER — Other Ambulatory Visit: Payer: Self-pay | Admitting: Family Medicine

## 2014-11-14 ENCOUNTER — Other Ambulatory Visit: Payer: Self-pay

## 2014-11-14 MED ORDER — SYNTHROID 175 MCG PO TABS: 175.0000 ug | ORAL_TABLET | Freq: Every day | ORAL | Status: DC

## 2014-11-14 MED ORDER — LISINOPRIL 10 MG PO TABS: 10.0000 mg | ORAL_TABLET | Freq: Every day | ORAL | Status: DC

## 2014-11-19 ENCOUNTER — Ambulatory Visit: Payer: 59

## 2014-12-11 ENCOUNTER — Telehealth: Payer: Self-pay

## 2014-12-11 NOTE — Telephone Encounter (Signed)
Pt called and left a message on MR voicemail regarding records that were to be sent to [facility]. States they advised that they have not received records yet. Called pt back and left a message to inform that the request was processed and mailed out.

## 2015-02-04 NOTE — Progress Notes (Signed)
Please put orders in Epic surgery 02-15-15 pre op 02-12-15 Thanks

## 2015-02-11 NOTE — Patient Instructions (Addendum)
Philip Humphrey  02/11/2015   Your procedure is scheduled on: 02/15/15   Report to [facility] Main  Entrance and follow signs to               [name] at 7:30 AM.   Call this number if you have problems the morning of surgery [phone]   Remember:  Do not eat food or drink liquids :After Midnight.     Take these medicines the morning of surgery with A SIP OF WATER: GABAPENTIN / OMEPRAZOLE / SYNTHROID                              You may not have any metal on your body including hair pins and              piercings  Do not wear jewelry, make-up, lotions, powders or perfumes.             Do not wear nail polish.  Do not shave  48 hours prior to surgery.              Men may shave face and neck.   Do not bring valuables to the hospital. [place].  Contacts, dentures or bridgework may not be worn into surgery.  Leave suitcase in the car. After surgery it may be brought to your room.     Patients discharged the day of surgery will not be allowed to drive home.  Name and phone number of your driver:  Special Instructions: N/A              Please read over the following fact sheets you were given: _____________________________________________________________________                                                     [place]  Before surgery, you can play an important role.  Because skin is not sterile, your skin needs to be as free of germs as possible.  You can reduce the number of germs on your skin by washing with CHG (chlorahexidine gluconate) soap before surgery.  CHG is an antiseptic cleaner which kills germs and bonds with the skin to continue killing germs even after washing. Please DO NOT use if you have an allergy to CHG or antibacterial soaps.  If your skin becomes reddened/irritated stop using the CHG and inform your nurse when you arrive at Short Stay. Do  not shave (including legs and underarms) for at least 48 hours prior to the first CHG shower.  You may shave your face. Please follow these instructions carefully:   1.  Shower with CHG Soap the night before surgery and the  morning of Surgery.   2.  If you choose to wash your hair, wash your hair first as usual with your  normal  Shampoo.   3.  After you shampoo, rinse your hair and body thoroughly to remove the  shampoo.  4.  Use CHG as you would any other liquid soap.  You can apply chg directly  to the skin and wash . Gently wash with scrungie or clean wascloth    5.  Apply the CHG Soap to your body ONLY FROM THE NECK DOWN.   Do not use on open                           Wound or open sores. Avoid contact with eyes, ears mouth and genitals (private parts).                        Genitals (private parts) with your normal soap.              6.  Wash thoroughly, paying special attention to the area where your surgery  will be performed.   7.  Thoroughly rinse your body with warm water from the neck down.   8.  DO NOT shower/wash with your normal soap after using and rinsing off  the CHG Soap .                9.  Pat yourself dry with a clean towel.             10.  Wear clean pajamas.             11.  Place clean sheets on your bed the night of your first shower and do not  sleep with pets.  Day of Surgery : Do not apply any lotions/deodorants the morning of surgery.  Please wear clean clothes to the hospital/surgery center.  FAILURE TO FOLLOW THESE INSTRUCTIONS MAY RESULT IN THE CANCELLATION OF YOUR SURGERY    PATIENT SIGNATURE_________________________________  ______________________________________________________________________

## 2015-02-12 ENCOUNTER — Encounter (HOSPITAL_COMMUNITY)
Admission: RE | Admit: 2015-02-12 | Discharge: 2015-02-12 | Disposition: A | Discharge status: Home / Self Care | Payer: 59 | Source: Ambulatory Visit | Attending: Surgery | Admitting: Surgery

## 2015-02-12 ENCOUNTER — Encounter (HOSPITAL_COMMUNITY): Payer: Self-pay

## 2015-02-12 DIAGNOSIS — F419 Anxiety disorder, unspecified: Secondary | ICD-10-CM | POA: Diagnosis not present

## 2015-02-12 DIAGNOSIS — Z86010 Personal history of colonic polyps: Secondary | ICD-10-CM | POA: Diagnosis present

## 2015-02-12 DIAGNOSIS — E079 Disorder of thyroid, unspecified: Secondary | ICD-10-CM | POA: Diagnosis not present

## 2015-02-12 DIAGNOSIS — A630 Anogenital (venereal) warts: Secondary | ICD-10-CM | POA: Diagnosis not present

## 2015-02-12 DIAGNOSIS — I10 Essential (primary) hypertension: Secondary | ICD-10-CM | POA: Diagnosis not present

## 2015-02-12 DIAGNOSIS — Z882 Allergy status to sulfonamides status: Secondary | ICD-10-CM | POA: Diagnosis not present

## 2015-02-12 DIAGNOSIS — G629 Polyneuropathy, unspecified: Secondary | ICD-10-CM | POA: Diagnosis not present

## 2015-02-12 DIAGNOSIS — K6389 Other specified diseases of intestine: Secondary | ICD-10-CM | POA: Diagnosis not present

## 2015-02-12 DIAGNOSIS — Z87891 Personal history of nicotine dependence: Secondary | ICD-10-CM | POA: Diagnosis not present

## 2015-02-12 DIAGNOSIS — Z888 Allergy status to other drugs, medicaments and biological substances status: Secondary | ICD-10-CM | POA: Diagnosis not present

## 2015-02-12 DIAGNOSIS — K219 Gastro-esophageal reflux disease without esophagitis: Secondary | ICD-10-CM | POA: Diagnosis not present

## 2015-02-12 HISTORY — DX: Anxiety disorder, unspecified: F41.9

## 2015-02-12 HISTORY — DX: Personal history of other medical treatment: Z92.89

## 2015-02-12 HISTORY — DX: Gastro-esophageal reflux disease without esophagitis: K21.9

## 2015-02-12 HISTORY — DX: Anogenital (venereal) warts: A63.0

## 2015-02-12 HISTORY — DX: Pain in unspecified knee: M25.569

## 2015-02-12 HISTORY — DX: Polyp of colon: K63.5

## 2015-02-12 HISTORY — DX: Polyneuropathy, unspecified: G62.9

## 2015-02-12 HISTORY — DX: Palpitations: R00.2

## 2015-02-12 LAB — BASIC METABOLIC PANEL
ANION GAP: 7 (ref 5–15)
BUN: 12 mg/dL (ref 6–23)
CHLORIDE: 105 mmol/L (ref 96–112)
CO2: 27 mmol/L (ref 19–32)
CREATININE: 0.97 mg/dL (ref 0.50–1.35)
Calcium: 9.1 mg/dL (ref 8.4–10.5)
GFR calc Af Amer: >90 mL/min (ref >90)
GFR calc non Af Amer: >90 mL/min (ref >90)
Glucose, Bld: 89 mg/dL (ref 70–99)
Potassium: 3.8 mmol/L (ref 3.5–5.1)
Sodium: 139 mmol/L (ref 135–145)

## 2015-02-12 LAB — CBC
HCT: 40.8 % (ref 39.0–52.0)
HEMOGLOBIN: 14 g/dL (ref 13.0–17.0)
MCH: 32.7 pg (ref 26.0–34.0)
MCHC: 34.3 g/dL (ref 30.0–36.0)
MCV: 95.3 fL (ref 78.0–100.0)
PLATELETS: 219 10*3/uL (ref 150–400)
RBC: 4.28 MIL/uL (ref 4.22–5.81)
RDW: 12.5 % (ref 11.5–15.5)
WBC: 6.3 10*3/uL (ref 4.0–10.5)

## 2015-02-12 NOTE — Progress Notes (Signed)
   02/12/15 1436  OBSTRUCTIVE SLEEP APNEA  Have you ever been diagnosed with sleep apnea through a sleep study? No  Do you snore loudly (loud enough to be heard through closed doors)?  1  Do you often feel tired, fatigued, or sleepy during the daytime? 0  Has anyone observed you stop breathing during your sleep? 0  Do you have, or are you being treated for high blood pressure? 1  BMI more than 35 kg/m2? 0  Age over 55 years old? 1  Neck circumference greater than 40 cm/16 inches? 1  Gender: 1

## 2015-02-12 NOTE — Progress Notes (Signed)
Contacted Kristi at Dr. Carlye Grippe office concerning any bowel prep - instructed pt to pick up bowel prep instructions at office.

## 2015-02-13 NOTE — Progress Notes (Signed)
Need orders in EPIC.  Surgery on 02/15/2015.  Thank You.

## 2015-02-13 NOTE — Progress Notes (Signed)
Final EKG in EPIC done 02/12/2015.

## 2015-02-14 ENCOUNTER — Ambulatory Visit (INDEPENDENT_AMBULATORY_CARE_PROVIDER_SITE_OTHER): Payer: Self-pay | Admitting: Surgery

## 2015-02-15 ENCOUNTER — Ambulatory Visit (HOSPITAL_COMMUNITY): Payer: 59 | Admitting: Certified Registered Nurse Anesthetist

## 2015-02-15 ENCOUNTER — Ambulatory Visit (HOSPITAL_COMMUNITY)
Admission: RE | Admit: 2015-02-15 | Discharge: 2015-02-15 | Disposition: A | Discharge status: Home / Self Care | Payer: 59 | Source: Ambulatory Visit | Attending: Surgery | Admitting: Surgery

## 2015-02-15 ENCOUNTER — Encounter (HOSPITAL_COMMUNITY)
Admission: RE | Disposition: A | Discharge status: Home / Self Care | Payer: Self-pay | Source: Ambulatory Visit | Attending: Surgery

## 2015-02-15 ENCOUNTER — Encounter (HOSPITAL_COMMUNITY): Payer: Self-pay

## 2015-02-15 DIAGNOSIS — K6389 Other specified diseases of intestine: Secondary | ICD-10-CM | POA: Diagnosis not present

## 2015-02-15 DIAGNOSIS — Z882 Allergy status to sulfonamides status: Secondary | ICD-10-CM | POA: Insufficient documentation

## 2015-02-15 DIAGNOSIS — A630 Anogenital (venereal) warts: Secondary | ICD-10-CM | POA: Insufficient documentation

## 2015-02-15 DIAGNOSIS — K219 Gastro-esophageal reflux disease without esophagitis: Secondary | ICD-10-CM | POA: Insufficient documentation

## 2015-02-15 DIAGNOSIS — Z87891 Personal history of nicotine dependence: Secondary | ICD-10-CM | POA: Insufficient documentation

## 2015-02-15 DIAGNOSIS — Z86010 Personal history of colonic polyps: Secondary | ICD-10-CM | POA: Insufficient documentation

## 2015-02-15 DIAGNOSIS — I10 Essential (primary) hypertension: Secondary | ICD-10-CM | POA: Insufficient documentation

## 2015-02-15 DIAGNOSIS — F419 Anxiety disorder, unspecified: Secondary | ICD-10-CM | POA: Insufficient documentation

## 2015-02-15 DIAGNOSIS — E079 Disorder of thyroid, unspecified: Secondary | ICD-10-CM | POA: Insufficient documentation

## 2015-02-15 DIAGNOSIS — G629 Polyneuropathy, unspecified: Secondary | ICD-10-CM | POA: Insufficient documentation

## 2015-02-15 DIAGNOSIS — Z888 Allergy status to other drugs, medicaments and biological substances status: Secondary | ICD-10-CM | POA: Insufficient documentation

## 2015-02-15 HISTORY — PX: WART FULGURATION: SHX5245

## 2015-02-15 HISTORY — PX: EXAMINATION UNDER ANESTHESIA: SHX1540

## 2015-02-15 HISTORY — PX: COLONOSCOPY: SHX5424

## 2015-02-15 HISTORY — DX: Unspecified injury of unspecified lower leg, initial encounter: S89.90XA

## 2015-02-15 SURGERY — FULGURATION, WART, ANUS
Anesthesia: General | Site: Rectum

## 2015-02-15 MED ORDER — CEFAZOLIN SODIUM-DEXTROSE 2-3 GM-% IV SOLR
INTRAVENOUS | Status: AC
  Filled 2015-02-15: qty 50

## 2015-02-15 MED ORDER — MIDAZOLAM HCL 5 MG/5ML IJ SOLN
INTRAMUSCULAR | Status: DC | PRN
  Administered 2015-02-15: 2 mg via INTRAVENOUS

## 2015-02-15 MED ORDER — ACETAMINOPHEN 325 MG PO TABS: 650.0000 mg | ORAL_TABLET | ORAL | Status: DC | PRN

## 2015-02-15 MED ORDER — ONDANSETRON HCL 4 MG/2ML IJ SOLN
INTRAMUSCULAR | Status: DC | PRN
  Administered 2015-02-15: 4 mg via INTRAVENOUS

## 2015-02-15 MED ORDER — ONDANSETRON HCL 4 MG/2ML IJ SOLN
INTRAMUSCULAR | Status: AC
  Filled 2015-02-15: qty 2

## 2015-02-15 MED ORDER — SODIUM CHLORIDE 0.9 % IJ SOLN: 3.0000 mL | Freq: Two times a day (BID) | INTRAMUSCULAR | Status: DC

## 2015-02-15 MED ORDER — HEPARIN SODIUM (PORCINE) 5000 UNIT/ML IJ SOLN
5000.0000 [IU] | Freq: Once | INTRAMUSCULAR | Status: AC
  Administered 2015-02-15: 5000 [IU] via SUBCUTANEOUS
  Filled 2015-02-15: qty 1

## 2015-02-15 MED ORDER — FENTANYL CITRATE 0.05 MG/ML IJ SOLN
INTRAMUSCULAR | Status: DC | PRN
  Administered 2015-02-15 (×2): 50 ug via INTRAVENOUS
  Administered 2015-02-15 (×2): 25 ug via INTRAVENOUS

## 2015-02-15 MED ORDER — FENTANYL CITRATE 0.05 MG/ML IJ SOLN: 25.0000 ug | INTRAMUSCULAR | Status: DC | PRN

## 2015-02-15 MED ORDER — 0.9 % SODIUM CHLORIDE (POUR BTL) OPTIME
TOPICAL | Status: DC | PRN
  Administered 2015-02-15: 1000 mL

## 2015-02-15 MED ORDER — CEFAZOLIN SODIUM-DEXTROSE 2-3 GM-% IV SOLR
2.0000 g | INTRAVENOUS | Status: AC
Start: 2015-02-15 — End: 2015-02-15
  Administered 2015-02-15: 2 g via INTRAVENOUS

## 2015-02-15 MED ORDER — MEPERIDINE HCL 50 MG/ML IJ SOLN
6.2500 mg | INTRAMUSCULAR | Status: DC | PRN
  Administered 2015-02-15 (×2): 12.5 mg via INTRAVENOUS

## 2015-02-15 MED ORDER — LACTATED RINGERS IV SOLN: INTRAVENOUS | Status: DC

## 2015-02-15 MED ORDER — HYDROCODONE-ACETAMINOPHEN 5-325 MG PO TABS: 1.0000 | ORAL_TABLET | ORAL | Status: DC | PRN

## 2015-02-15 MED ORDER — BUPIVACAINE-EPINEPHRINE (PF) 0.5% -1:200000 IJ SOLN
INTRAMUSCULAR | Status: AC
  Filled 2015-02-15: qty 30

## 2015-02-15 MED ORDER — LIDOCAINE HCL (CARDIAC) 20 MG/ML IV SOLN
INTRAVENOUS | Status: AC
  Filled 2015-02-15: qty 5

## 2015-02-15 MED ORDER — LACTATED RINGERS IV SOLN
INTRAVENOUS | Status: DC
  Administered 2015-02-15: 11:00:00 via INTRAVENOUS

## 2015-02-15 MED ORDER — BUPIVACAINE LIPOSOME 1.3 % IJ SUSP
20.0000 mL | Freq: Once | INTRAMUSCULAR | Status: DC
  Filled 2015-02-15: qty 20

## 2015-02-15 MED ORDER — SODIUM CHLORIDE 0.9 % IV SOLN: 250.0000 mL | INTRAVENOUS | Status: DC | PRN

## 2015-02-15 MED ORDER — BUPIVACAINE-EPINEPHRINE 0.5% -1:200000 IJ SOLN
INTRAMUSCULAR | Status: DC | PRN
  Administered 2015-02-15: 10 mL

## 2015-02-15 MED ORDER — PROPOFOL 10 MG/ML IV BOLUS
INTRAVENOUS | Status: AC
  Filled 2015-02-15: qty 20

## 2015-02-15 MED ORDER — PROPOFOL 10 MG/ML IV BOLUS
INTRAVENOUS | Status: DC | PRN
  Administered 2015-02-15: 200 mg via INTRAVENOUS

## 2015-02-15 MED ORDER — ACETAMINOPHEN 650 MG RE SUPP
650.0000 mg | RECTAL | Status: DC | PRN
  Filled 2015-02-15: qty 1

## 2015-02-15 MED ORDER — BACITRACIN-NEOMYCIN-POLYMYXIN 400-5-5000 EX OINT
TOPICAL_OINTMENT | CUTANEOUS | Status: AC
  Filled 2015-02-15: qty 1

## 2015-02-15 MED ORDER — FENTANYL CITRATE 0.05 MG/ML IJ SOLN
INTRAMUSCULAR | Status: AC
  Filled 2015-02-15: qty 5

## 2015-02-15 MED ORDER — SODIUM CHLORIDE 0.9 % IJ SOLN: 3.0000 mL | INTRAMUSCULAR | Status: DC | PRN

## 2015-02-15 MED ORDER — MIDAZOLAM HCL 2 MG/2ML IJ SOLN
INTRAMUSCULAR | Status: AC
  Filled 2015-02-15: qty 2

## 2015-02-15 MED ORDER — LIDOCAINE HCL (CARDIAC) 20 MG/ML IV SOLN
INTRAVENOUS | Status: DC | PRN
Start: 2015-02-15 — End: 2015-02-15
  Administered 2015-02-15: 100 mg via INTRAVENOUS

## 2015-02-15 MED ORDER — MEPERIDINE HCL 50 MG/ML IJ SOLN
INTRAMUSCULAR | Status: AC
  Filled 2015-02-15: qty 1

## 2015-02-15 MED ORDER — LACTATED RINGERS IV SOLN
INTRAVENOUS | Status: DC | PRN
  Administered 2015-02-15: 09:00:00 via INTRAVENOUS

## 2015-02-15 SURGICAL SUPPLY — 34 items
BLADE HEX COATED 2.75 (ELECTRODE) ×3 IMPLANT
BLADE SURG 15 STRL LF DISP TIS (BLADE) ×2 IMPLANT
BLADE SURG 15 STRL SS (BLADE) ×1
BRIEF STRETCH FOR OB PAD LRG (UNDERPADS AND DIAPERS) ×3 IMPLANT
DECANTER SPIKE VIAL GLASS SM (MISCELLANEOUS) IMPLANT
DRAIN PENROSE 18X1/2 LTX STRL (DRAIN) IMPLANT
DRAPE SURG IRRIG POUCH 19X23 (DRAPES) ×3 IMPLANT
DRSG PAD ABDOMINAL 8X10 ST (GAUZE/BANDAGES/DRESSINGS) IMPLANT
ELECT REM PT RETURN 9FT ADLT (ELECTROSURGICAL) ×3
ELECTRODE REM PT RTRN 9FT ADLT (ELECTROSURGICAL) ×2 IMPLANT
GAUZE SPONGE 4X4 12PLY STRL (GAUZE/BANDAGES/DRESSINGS) ×3 IMPLANT
GAUZE SPONGE 4X4 16PLY XRAY LF (GAUZE/BANDAGES/DRESSINGS) ×3 IMPLANT
GLOVE BIOGEL M 8.0 STRL (GLOVE) ×3 IMPLANT
GLOVE BIOGEL PI IND STRL 7.0 (GLOVE) ×2 IMPLANT
GLOVE BIOGEL PI INDICATOR 7.0 (GLOVE) ×1
GOWN SPEC L4 XLG W/TWL (GOWN DISPOSABLE) IMPLANT
GOWN STRL REUS W/ TWL XL LVL3 (GOWN DISPOSABLE) ×8 IMPLANT
GOWN STRL REUS W/TWL LRG LVL3 (GOWN DISPOSABLE) ×12 IMPLANT
GOWN STRL REUS W/TWL XL LVL3 (GOWN DISPOSABLE) ×4 IMPLANT
KIT BASIN OR (CUSTOM PROCEDURE TRAY) ×3 IMPLANT
LUBRICANT JELLY K Y 4OZ (MISCELLANEOUS) ×3 IMPLANT
NDL SAFETY ECLIPSE 18X1.5 (NEEDLE) ×2 IMPLANT
NEEDLE HYPO 18GX1.5 SHARP (NEEDLE) ×1
NEEDLE HYPO 25X1 1.5 SAFETY (NEEDLE) ×3 IMPLANT
NS IRRIG 1000ML POUR BTL (IV SOLUTION) ×3 IMPLANT
PACK LITHOTOMY IV (CUSTOM PROCEDURE TRAY) ×3 IMPLANT
PAD ABD 8X10 STRL (GAUZE/BANDAGES/DRESSINGS) ×3 IMPLANT
PENCIL BUTTON HOLSTER BLD 10FT (ELECTRODE) ×3 IMPLANT
SPONGE SURGIFOAM ABS GEL 12-7 (HEMOSTASIS) IMPLANT
SUT CHROMIC 2 0 SH (SUTURE) IMPLANT
SUT CHROMIC 3 0 SH 27 (SUTURE) IMPLANT
SYR CONTROL 10ML LL (SYRINGE) ×3 IMPLANT
UNDERPAD 30X30 INCONTINENT (UNDERPADS AND DIAPERS) IMPLANT
YANKAUER SUCT BULB TIP 10FT TU (MISCELLANEOUS) ×3 IMPLANT

## 2015-02-15 NOTE — Op Note (Signed)
02/15/2015  10:11 AM  PATIENT:  Philip Humphrey, 54 y.o., male, MRN: [id number]  PREOP DIAGNOSIS:  HX OF COLON POLYPS AND CONDYLOMA ACCUMINATA  POSTOP DIAGNOSIS:   Normal colon, some hypertrophy of sigmoid colon wall  PROCEDURE:   Procedure(s):   COLONOSCOPY  SURGEON:   Alphonsa Overall, M.D.  ANESTHESIA:   general  Anesthesiologist: Peyton Najjar, MD CRNA: Montel Clock, CRNA  General  SPECIMEN:   none  INDICATIONS FOR PROCEDURE:  Philip Humphrey is a 54 y.o. (DOB: November 28, 1961) white  male whose primary care physician is Default, Provider, MD and comes for colonoscopy.   He has a history of a colonic polyp that was partially obstructing removed about 2001 by Dr. Lenna Sciara. Earlean Shawl.  He was getting colonoscopies about every 3 to 5 years, but his wife has had breast cancer.  This has interrupted his follow up.  He thinks that his last colonoscopy was about 6 to 7 years ago.  Dr. Hassell Done is putting him under general anesthesia for some anal disease and asked if I could do the colonoscopy at the same time.  He completed a Miralax bowel prep at home.   The indications and risks of colonoscopy were explained to the patient.  The risks include, but are not limited to, perforation of the bowel and bleeding.  OPERATIVE NOTE:  The patient was taken to room # 1 in the [facility] OR.  This procedure was done in conjunction with an anal case that Dr. Hassell Done is doing.  Anesthesia was managed by Dr. Landry Dyke.   A digital rectal exam was done at the beginning of the procedure..  The anus and rectum were unremarkable.     The flexible Pentax colonoscope was passed up the rectum without difficulty.  The scope was advanced to the cecum and the ileocecal valve was identified.  The colonic prep was good.  He has some stool stuck on the wall of the distal 15 cm/rectum, but this washed away easily.   The right colon, transverse colon, left colon, and sigmoid colon were unremarkable.  There was some hypertrophy of the  sigmoid colon wall, but nothing else remarkable.   The scope was withdrawn into the rectum and retroflexed.  The rectum was unremarkable.  The software for taking pictures was not working.  So I could not take photos.   Dr. Hassell Done is doing his procedure after I am finished.  He will dictate that portion of the operation.   The patient's next colonoscopy should be in 5 years.Alphonsa Overall, MD, [facility] Pager: [phone] Office phone:  [phone]

## 2015-02-15 NOTE — Op Note (Signed)
Surgeon: Kaylyn Lim, MD, FACS  Asst:  none  Anes:  general  Procedure: EUA and excision of large posterior skin tag (complex condylomata?)  Diagnosis: Skin tab  Complications: none  EBL:   minimal cc  Drains: none  Description of Procedure:  The patient was taken to OR 1 at [facility].  After anesthesia was administered and the patient was prepped a timeout was performed.  The patient had a colonscopy per Dr. Lucia Gaskins first.  With him in the dorsal lithotomy position I performed the exam.  There was a 1.5 cm excrescent mass that was elevated and excised with the Bovie.  There were two other areas on the right that were cauterized with the Bovie.  The bullet retractor was used to examine the anal canal.  No other masses or tags were noted.  0.5% marcaine was injected into the areas.  Neosporin was massaged into the perianal region.    The patient tolerated the procedure well and was taken to the PACU in stable condition.     Matt B. Hassell Done, [place], [facility], [place]

## 2015-02-15 NOTE — Interval H&P Note (Signed)
History and Physical Interval Note:  02/15/2015 9:31 AM  Philip Humphrey  has presented today for surgery, with the diagnosis of HX OF COLON POLYPS AND CONDYLOMA ACCUMINATA  The various methods of treatment have been discussed with the patient and family. After consideration of risks, benefits and other options for treatment, the patient has consented to  Procedure(s): REMOVAL OF ANAL TAGS,CONDYLOMA (N/A) EXAM UNDER ANESTHESIA (N/A) COLONOSCOPY (N/A) as a surgical intervention .  The patient's history has been reviewed, patient examined, no change in status, stable for surgery.  I have reviewed the patient's chart and labs.  Questions were answered to the patient's satisfaction.     Kambra Beachem B

## 2015-02-15 NOTE — Transfer of Care (Signed)
Immediate Anesthesia Transfer of Care Note  Patient: Philip Humphrey  Procedure(s) Performed: Procedure(s) (LRB): REMOVAL OF ANAL TAGS,CONDYLOMA (N/A) EXAM UNDER ANESTHESIA (N/A) COLONOSCOPY (N/A)  Patient Location: PACU  Anesthesia Type: General  Level of Consciousness: sedated, patient cooperative and responds to stimulation  Airway & Oxygen Therapy: Patient Spontanous Breathing and Patient connected to face mask oxgen  Post-op Assessment: Report given to PACU RN and Post -op Vital signs reviewed and stable  Post vital signs: Reviewed and stable  Complications: No apparent anesthesia complications

## 2015-02-15 NOTE — Anesthesia Postprocedure Evaluation (Signed)
  Anesthesia Post-op Note  Patient: Philip Humphrey  Procedure(s) Performed: Procedure(s) (LRB): REMOVAL OF ANAL TAGS,CONDYLOMA (N/A) EXAM UNDER ANESTHESIA (N/A) COLONOSCOPY (N/A)  Patient Location: PACU  Anesthesia Type: General  Level of Consciousness: awake and alert   Airway and Oxygen Therapy: Patient Spontanous Breathing  Post-op Pain: mild  Post-op Assessment: Post-op Vital signs reviewed, Patient's Cardiovascular Status Stable, Respiratory Function Stable, Patent Airway and No signs of Nausea or vomiting  Last Vitals:  Filed Vitals:   02/15/15 1144  BP: 138/89  Pulse: 95  Temp: 36.4 C  Resp: 12    Post-op Vital Signs: stable   Complications: No apparent anesthesia complications

## 2015-02-15 NOTE — Discharge Instructions (Signed)
Sitz baths after BM  Use gauze and stretch mesh briefs to dress surgical site.   General Anesthesia, Care After Refer to this sheet in the next few weeks. These instructions provide you with information on caring for yourself after your procedure. Your health care provider may also give you more specific instructions. Your treatment has been planned according to current medical practices, but problems sometimes occur. Call your health care provider if you have any problems or questions after your procedure. WHAT TO EXPECT AFTER THE PROCEDURE After the procedure, it is typical to experience:  Sleepiness.  Nausea and vomiting. HOME CARE INSTRUCTIONS  For the first 24 hours after general anesthesia:  Have a responsible person with you.  Do not drive a car. If you are alone, do not take public transportation.  Do not drink alcohol.  Do not take medicine that has not been prescribed by your health care provider.  Do not sign important papers or make important decisions.  You may resume a normal diet and activities as directed by your health care provider.  Change bandages (dressings) as directed.  If you have questions or problems that seem related to general anesthesia, call the hospital and ask for the anesthetist or anesthesiologist on call. SEEK MEDICAL CARE IF:  You have nausea and vomiting that continue the day after anesthesia.  You develop a rash. SEEK IMMEDIATE MEDICAL CARE IF:   You have difficulty breathing.  You have chest pain.  You have any allergic problems. Document Released: 03/15/2001 Document Revised: 12/12/2013 Document Reviewed: 06/22/2013 [facility] Patient Information 2015 [place], [place]. This information is not intended to replace advice given to you by your health care provider. Make sure you discuss any questions you have with your health care provider.

## 2015-02-15 NOTE — Anesthesia Procedure Notes (Signed)
Procedure Name: LMA Insertion Date/Time: 02/15/2015 9:46 AM Performed by: Montel Clock Pre-anesthesia Checklist: Patient identified, Emergency Drugs available, Suction available, Patient being monitored and Timeout performed Patient Re-evaluated:Patient Re-evaluated prior to inductionOxygen Delivery Method: Circle system utilized Preoxygenation: Pre-oxygenation with 100% oxygen Intubation Type: IV induction Ventilation: Mask ventilation without difficulty LMA: LMA with gastric port inserted LMA Size: 4.0 Number of attempts: 1 Dental Injury: Teeth and Oropharynx as per pre-operative assessment

## 2015-02-15 NOTE — Anesthesia Preprocedure Evaluation (Signed)
Anesthesia Evaluation  Patient identified by MRN, date of birth, ID band Patient awake    Reviewed: Allergy & Precautions, H&P , NPO status , Patient's Chart, lab work & pertinent test results  Airway Mallampati: II  TM Distance: >3 FB Neck ROM: full    Dental no notable dental hx.    Pulmonary neg pulmonary ROS, former smoker,  breath sounds clear to auscultation  Pulmonary exam normal       Cardiovascular Exercise Tolerance: Good hypertension, Pt. on medications negative cardio ROS  Rhythm:regular Rate:Normal  palpitations   Neuro/Psych Anxiety negative neurological ROS  negative psych ROS   GI/Hepatic negative GI ROS, Neg liver ROS,   Endo/Other  negative endocrine ROS  Renal/GU negative Renal ROS  negative genitourinary   Musculoskeletal   Abdominal   Peds  Hematology negative hematology ROS (+)   Anesthesia Other Findings   Reproductive/Obstetrics negative OB ROS                             Anesthesia Physical Anesthesia Plan  ASA: II  Anesthesia Plan: General   Post-op Pain Management:    Induction: Intravenous  Airway Management Planned: LMA  Additional Equipment:   Intra-op Plan:   Post-operative Plan:   Informed Consent: I have reviewed the patients History and Physical, chart, labs and discussed the procedure including the risks, benefits and alternatives for the proposed anesthesia with the patient or authorized representative who has indicated his/her understanding and acceptance.   Dental Advisory Given  Plan Discussed with: CRNA and Surgeon  Anesthesia Plan Comments:         Anesthesia Quick Evaluation

## 2015-02-15 NOTE — H&P (Signed)
Chief Complaint:  Anal condylomata and history of polyps  History of Present Illness:  Philip Humphrey is an 54 y.o. male who presents for colonoscopy during anesthesia for EUA and removal of anal condylomata.    Past Medical History  Diagnosis Date  . Allergy   . Hypertension   . Thyroid disease     Thyroid removed unintentionally at age 49  . Palpitations     normal stress test 10 yrs ago  . Knee pain   . Neuropathy   . GERD (gastroesophageal reflux disease)   . Condyloma     anal  . History of transfusion     as child  . Anxiety   . Colon polyp   . Knee injury     right knee cap w piece broken off- MRI done 02-06-15-pending surgery    Past Surgical History  Procedure Laterality Date  . Colonoscopy    . Hernia repair      10 yrs ago  . Wrist surgery      x2  . Rotator cuff repair    . Thyroidectomy  age 18   . Cyst removed      from neck    Current Facility-Administered Medications  Medication Dose Route Frequency Provider Last Rate Last Dose  . bupivacaine liposome (EXPAREL) 1.3 % injection 266 mg  20 mL Infiltration Once Pedro Earls, MD      . ceFAZolin (ANCEF) IVPB 2 g/50 mL premix  2 g Intravenous On Call to OR Pedro Earls, MD       Facility-Administered Medications Ordered in Other Encounters  Medication Dose Route Frequency Provider Last Rate Last Dose  . lactated ringers infusion    Continuous PRN Maxwell Caul, CRNA       Oxycodone and Sulfa antibiotics Family History  Problem Relation Age of Onset  . Cancer Mother     Lung  . Heart disease Mother   . Cancer Brother     Lung  . Lung disease Brother   . Stroke Father    Social History:   reports that he quit smoking about 3 years ago. His smoking use included Cigars. He does not have any smokeless tobacco history on file. He reports that he drinks alcohol. His drug history is not on file.   REVIEW OF SYSTEMS : Negative except for see HPI  Physical Exam:   Blood pressure 141/92,  pulse 91, temperature 97.9 F (36.6 C), temperature source Oral, resp. rate 16, height 5\' 10"  (1.778 m), weight 241 lb (109.317 kg), SpO2 100 %. Body mass index is 34.58 kg/(m^2).  Gen:  WDWN WM NAD  Neurological: Alert and oriented to person, place, and time. Motor and sensory function is grossly intact  Head: Normocephalic and atraumatic.  Eyes: Conjunctivae are normal. Pupils are equal, round, and reactive to light. No scleral icterus.  Neck: Normal range of motion. Neck supple. No tracheal deviation or thyromegaly present.  Cardiovascular:  SR without murmurs or gallops.  No carotid bruits Breast:  Not examined Respiratory: Effort normal.  No respiratory distress. No chest wall tenderness. Breath sounds normal.  No wheezes, rales or rhonchi.  Abdomen:  nontender GU:  Anal warts /skin excrescences Musculoskeletal: Normal range of motion. Extremities are nontender. No cyanosis, edema or clubbing noted Lymphadenopathy: No cervical, preauricular, postauricular or axillary adenopathy is present Skin: Skin is warm and dry. No rash noted. No diaphoresis. No erythema. No pallor. Pscyh: Normal mood and affect. Behavior is normal.  Judgment and thought content normal.   LABORATORY RESULTS: No results found for this or any previous visit (from the past 48 hour(s)).   RADIOLOGY RESULTS: No results found.  Problem List: Patient Active Problem List   Diagnosis Date Noted  . Hypothyroidism 04/12/2012  . HTN (hypertension) 04/12/2012  . History of gastritis 04/12/2012  . History of tobacco use 04/12/2012  . Overweight 04/12/2012    Assessment & Plan: Need for colonoscopy and EUA and removal of anal condylomata    Matt B. Hassell Done, MD, [facility] [phone] beeper [phone]  02/15/2015 9:29 AM

## 2015-02-18 ENCOUNTER — Encounter (HOSPITAL_COMMUNITY): Payer: Self-pay | Admitting: Surgery

## 2016-10-07 ENCOUNTER — Other Ambulatory Visit: Payer: Self-pay | Admitting: Orthopedic Surgery

## 2016-10-07 DIAGNOSIS — M25552 Pain in left hip: Secondary | ICD-10-CM

## 2016-10-08 ENCOUNTER — Ambulatory Visit
Admission: RE | Admit: 2016-10-08 | Discharge: 2016-10-08 | Disposition: A | Discharge status: Home / Self Care | Payer: 59 | Source: Ambulatory Visit | Attending: Orthopedic Surgery | Admitting: Orthopedic Surgery

## 2016-10-08 DIAGNOSIS — M25552 Pain in left hip: Secondary | ICD-10-CM

## 2016-11-06 NOTE — H&P (Signed)
TOTAL HIP ADMISSION H&P  Patient is admitted for left total hip arthroplasty, anterior approach.  Subjective:  Chief Complaint:    Left hip AVN / pain  HPI: Philip Humphrey, 55 y.o. male, has a history of pain and functional disability in the left hip(s) due to arthritis and patient has failed non-surgical conservative treatments for greater than 12 weeks to include NSAID's and/or analgesics, corticosteriod injections, use of assistive devices and activity modification.  Onset of symptoms was gradual starting  years ago with gradually worsening course since that time.The patient noted no past surgery on the left hip(s).  Patient currently rates pain in the left hip at 10 out of 10 with activity. Patient has night pain, worsening of pain with activity and weight bearing, trendelenberg gait, pain that interfers with activities of daily living and pain with passive range of motion. Patient has evidence of periarticular osteophytes and joint space narrowing by imaging studies. This condition presents safety issues increasing the risk of falls. There is no current active infection.   Risks, benefits and expectations were discussed with the patient.  Risks including but not limited to the risk of anesthesia, blood clots, nerve damage, blood vessel damage, failure of the prosthesis, infection and up to and including death.  Patient understand the risks, benefits and expectations and wishes to proceed with surgery.   PCP: Default, Provider, MD  D/C Plans:      Home  Post-op Meds:       No Rx given  Tranexamic Acid:      To be given - IV   Decadron:      Is to be given  FYI:     ASA  Norco    Patient Active Problem List   Diagnosis Date Noted  . Hypothyroidism 04/12/2012  . HTN (hypertension) 04/12/2012  . History of gastritis 04/12/2012  . History of tobacco use 04/12/2012  . Overweight(278.02) 04/12/2012   Past Medical History:  Diagnosis Date  . Allergy   . Anxiety   . Colon polyp    . Condyloma    anal  . GERD (gastroesophageal reflux disease)   . History of transfusion    as child  . Hypertension   . Knee injury    right knee cap w piece broken off- MRI done 02-06-15-pending surgery  . Knee pain   . Neuropathy   . Palpitations    normal stress test 10 yrs ago  . Thyroid disease    Thyroid removed unintentionally at age 46    Past Surgical History:  Procedure Laterality Date  . COLONOSCOPY    . COLONOSCOPY N/A 02/15/2015   Procedure: COLONOSCOPY;  Surgeon: Alphonsa Overall, MD;  Location: [facility];  Service: General;  Laterality: N/A;  . cyst removed     from neck  . EXAMINATION UNDER ANESTHESIA N/A 02/15/2015   Procedure: EXAM UNDER ANESTHESIA;  Surgeon: Pedro Earls, MD;  Location: [facility];  Service: General;  Laterality: N/A;  . HERNIA REPAIR     10 yrs ago  . ROTATOR CUFF REPAIR    . THYROIDECTOMY  age 6   . WART FULGURATION N/A 02/15/2015   Procedure: REMOVAL OF ANAL TAGS,CONDYLOMA;  Surgeon: Pedro Earls, MD;  Location: [facility];  Service: General;  Laterality: N/A;  . WRIST SURGERY     x2    No prescriptions prior to admission.   Allergies  Allergen Reactions  . Oxycodone Itching  . Sulfa Antibiotics Swelling    MOUTH  AND THROAT    Social History  Substance Use Topics  . Smoking status: Former Smoker    Types: Cigars    Quit date: 08/22/2011  . Smokeless tobacco: Not on file  . Alcohol use Yes     Comment: OCCASIONALLY BEER    Family History  Problem Relation Age of Onset  . Cancer Mother     Lung  . Heart disease Mother   . Cancer Brother     Lung  . Lung disease Brother   . Stroke Father      Review of Systems  Constitutional: Positive for malaise/fatigue.  HENT: Negative.   Eyes: Negative.   Respiratory: Negative.   Cardiovascular: Negative.   Gastrointestinal: Positive for heartburn.  Genitourinary: Negative.   Musculoskeletal: Positive for joint pain.  Skin: Negative.   Neurological: Negative.   Endo/Heme/Allergies:  Positive for environmental allergies.  Psychiatric/Behavioral: The patient is nervous/anxious.     Objective:  Physical Exam  Constitutional: He is oriented to person, place, and time. He appears well-developed.  HENT:  Head: Normocephalic.  Eyes: Pupils are equal, round, and reactive to light.  Neck: Neck supple. No JVD present. No tracheal deviation present. No thyromegaly present.  Cardiovascular: Normal rate, regular rhythm, normal heart sounds and intact distal pulses.   Respiratory: Effort normal and breath sounds normal. No respiratory distress. He has no wheezes.  GI: Soft. There is no tenderness. There is no guarding.  Musculoskeletal:       Left hip: He exhibits decreased range of motion, decreased strength, tenderness and bony tenderness. He exhibits no swelling, no deformity and no laceration.  Lymphadenopathy:    He has no cervical adenopathy.  Neurological: He is alert and oriented to person, place, and time. A sensory deficit (bilateral LE neuropathy) is present.  Skin: Skin is warm and dry.  Psychiatric: He has a normal mood and affect.      Labs:  Estimated body mass index is 33 kg/m as calculated from the following:   Height as of 02/15/15: 5\' 10"  (1.778 m).   Weight as of 10/08/16: 104.3 kg (230 lb).   Imaging Review Plain radiographs demonstrate severe degenerative joint disease of the left hip(s). The bone quality appears to be good for age and reported activity level.  Assessment/Plan:  End stage arthritis / AVN, left hip(s)  The patient history, physical examination, clinical judgement of the provider and imaging studies are consistent with end stage degenerative joint disease / AVN of the left hip(s) and total hip arthroplasty is deemed medically necessary. The treatment options including medical management, injection therapy, arthroscopy and arthroplasty were discussed at length. The risks and benefits of total hip arthroplasty were presented and  reviewed. The risks due to aseptic loosening, infection, stiffness, dislocation/subluxation,  thromboembolic complications and other imponderables were discussed.  The patient acknowledged the explanation, agreed to proceed with the plan and consent was signed. Patient is being admitted for inpatient treatment for surgery, pain control, PT, OT, prophylactic antibiotics, VTE prophylaxis, progressive ambulation and ADL's and discharge planning.The patient is planning to be discharged home.     West Pugh Monchel Pollitt   PA-C  11/06/2016, 9:45 PM

## 2016-11-18 ENCOUNTER — Encounter (INDEPENDENT_AMBULATORY_CARE_PROVIDER_SITE_OTHER): Payer: Self-pay

## 2016-11-18 ENCOUNTER — Encounter (HOSPITAL_COMMUNITY): Payer: Self-pay

## 2016-11-18 ENCOUNTER — Encounter (HOSPITAL_COMMUNITY)
Admission: RE | Admit: 2016-11-18 | Discharge: 2016-11-18 | Disposition: A | Discharge status: Home / Self Care | Payer: 59 | Source: Ambulatory Visit | Attending: Orthopedic Surgery | Admitting: Orthopedic Surgery

## 2016-11-18 DIAGNOSIS — Z01818 Encounter for other preprocedural examination: Secondary | ICD-10-CM | POA: Insufficient documentation

## 2016-11-18 DIAGNOSIS — M87059 Idiopathic aseptic necrosis of unspecified femur: Secondary | ICD-10-CM | POA: Diagnosis not present

## 2016-11-18 HISTORY — DX: Unspecified osteoarthritis, unspecified site: M19.90

## 2016-11-18 LAB — BASIC METABOLIC PANEL
ANION GAP: 6 (ref 5–15)
BUN: 10 mg/dL (ref 6–20)
CHLORIDE: 105 mmol/L (ref 101–111)
CO2: 27 mmol/L (ref 22–32)
Calcium: 9.2 mg/dL (ref 8.9–10.3)
Creatinine, Ser: 1.03 mg/dL (ref 0.61–1.24)
GFR calc Af Amer: >60 mL/min (ref >60)
GFR calc non Af Amer: >60 mL/min (ref >60)
GLUCOSE: 97 mg/dL (ref 65–99)
POTASSIUM: 4.3 mmol/L (ref 3.5–5.1)
Sodium: 138 mmol/L (ref 135–145)

## 2016-11-18 LAB — CBC
HEMATOCRIT: 44.5 % (ref 39.0–52.0)
HEMOGLOBIN: 14.9 g/dL (ref 13.0–17.0)
MCH: 33.1 pg (ref 26.0–34.0)
MCHC: 33.5 g/dL (ref 30.0–36.0)
MCV: 98.9 fL (ref 78.0–100.0)
Platelets: 200 10*3/uL (ref 150–400)
RBC: 4.5 MIL/uL (ref 4.22–5.81)
RDW: 12.6 % (ref 11.5–15.5)
WBC: 7.6 10*3/uL (ref 4.0–10.5)

## 2016-11-18 LAB — SURGICAL PCR SCREEN
MRSA, PCR: NEGATIVE
STAPHYLOCOCCUS AUREUS: NEGATIVE

## 2016-11-18 LAB — ABO/RH: ABO/RH(D): A POS

## 2016-11-18 NOTE — Pre-Procedure Instructions (Signed)
EKG 11'17 , Clearance note 10-22-16 Dr. Ronnald Ramp with chart.

## 2016-11-18 NOTE — Patient Instructions (Signed)
Philip Humphrey  11/18/2016   Your procedure is scheduled on: 11-24-16   Report to [facility] Main  Entrance take [facility]  elevators to 3rd floor to  [place] at  0600  AM.  Call this number if you have problems the morning of surgery [phone]   Remember: ONLY 1 PERSON MAY GO WITH YOU TO SHORT STAY TO GET  READY MORNING OF [place].  Do not eat food or drink liquids :After Midnight.     Take these medicines the morning of surgery with A SIP OF WATER: Omeprazole. Synthroid. Lipitor. Tylenol-if need. DO NOT TAKE ANY DIABETIC MEDICATIONS DAY OF YOUR SURGERY                               You may not have any metal on your body including hair pins and              piercings  Do not wear jewelry, make-up, lotions, powders or perfumes, deodorant             Do not wear nail polish.  Do not shave  48 hours prior to surgery.              Men may shave face and neck.   Do not bring valuables to the hospital. [place].  Contacts, dentures or bridgework may not be worn into surgery.  Leave suitcase in the car. After surgery it may be brought to your room.     Patients discharged the day of surgery will not be allowed to drive home.  Name and phone number of your driver: Philip Humphrey- spouse [phone] cell  Special Instructions: N/A              Please read over the following fact sheets you were given: _____________________________________________________________________             [facility] - Preparing for Surgery Before surgery, you can play an important role.  Because skin is not sterile, your skin needs to be as free of germs as possible.  You can reduce the number of germs on your skin by washing with CHG (chlorahexidine gluconate) soap before surgery.  CHG is an antiseptic cleaner which kills germs and bonds with the skin to continue killing germs even after washing. Please DO NOT use if you  have an allergy to CHG or antibacterial soaps.  If your skin becomes reddened/irritated stop using the CHG and inform your nurse when you arrive at Short Stay. Do not shave (including legs and underarms) for at least 48 hours prior to the first CHG shower.  You may shave your face/neck. Please follow these instructions carefully:  1.  Shower with CHG Soap the night before surgery and the  morning of Surgery.  2.  If you choose to wash your hair, wash your hair first as usual with your  normal  shampoo.  3.  After you shampoo, rinse your hair and body thoroughly to remove the  shampoo.                           4.  Use CHG as you would any other liquid soap.  You can  apply chg directly  to the skin and wash                       Gently with a scrungie or clean washcloth.  5.  Apply the CHG Soap to your body ONLY FROM THE NECK DOWN.   Do not use on face/ open                           Wound or open sores. Avoid contact with eyes, ears mouth and genitals (private parts).                       Wash face,  Genitals (private parts) with your normal soap.             6.  Wash thoroughly, paying special attention to the area where your surgery  will be performed.  7.  Thoroughly rinse your body with warm water from the neck down.  8.  DO NOT shower/wash with your normal soap after using and rinsing off  the CHG Soap.                9.  Pat yourself dry with a clean towel.            10.  Wear clean pajamas.            11.  Place clean sheets on your bed the night of your first shower and do not  sleep with pets. Day of Surgery : Do not apply any lotions/deodorants the morning of surgery.  Please wear clean clothes to the hospital/surgery center.  FAILURE TO FOLLOW THESE INSTRUCTIONS MAY RESULT IN THE CANCELLATION OF YOUR SURGERY PATIENT SIGNATURE_________________________________  NURSE  SIGNATURE__________________________________  ________________________________________________________________________   [name]  An incentive spirometer is a tool that can help keep your lungs clear and active. This tool measures how well you are filling your lungs with each breath. Taking long deep breaths may help reverse or decrease the chance of developing breathing (pulmonary) problems (especially infection) following:  A long period of time when you are unable to move or be active. BEFORE THE PROCEDURE   If the spirometer includes an indicator to show your best effort, your nurse or respiratory therapist will set it to a desired goal.  If possible, sit up straight or lean slightly forward. Try not to slouch.  Hold the incentive spirometer in an upright position. INSTRUCTIONS FOR USE  1. Sit on the edge of your bed if possible, or sit up as far as you can in bed or on a chair. 2. Hold the incentive spirometer in an upright position. 3. Breathe out normally. 4. Place the mouthpiece in your mouth and seal your lips tightly around it. 5. Breathe in slowly and as deeply as possible, raising the piston or the ball toward the top of the column. 6. Hold your breath for 3-5 seconds or for as long as possible. Allow the piston or ball to fall to the bottom of the column. 7. Remove the mouthpiece from your mouth and breathe out normally. 8. Rest for a few seconds and repeat Steps 1 through 7 at least 10 times every 1-2 hours when you are awake. Take your time and take a few normal breaths between deep breaths. 9. The spirometer may include an indicator to show your best effort. Use the indicator as a goal to work toward during each  repetition. 10. After each set of 10 deep breaths, practice coughing to be sure your lungs are clear. If you have an incision (the cut made at the time of surgery), support your incision when coughing by placing a pillow or rolled up towels firmly  against it. Once you are able to get out of bed, walk around indoors and cough well. You may stop using the incentive spirometer when instructed by your caregiver.  RISKS AND COMPLICATIONS  Take your time so you do not get dizzy or light-headed.  If you are in pain, you may need to take or ask for pain medication before doing incentive spirometry. It is harder to take a deep breath if you are having pain. AFTER USE  Rest and breathe slowly and easily.  It can be helpful to keep track of a log of your progress. Your caregiver can provide you with a simple table to help with this. If you are using the spirometer at home, follow these instructions: [place] IF:   You are having difficultly using the spirometer.  You have trouble using the spirometer as often as instructed.  Your pain medication is not giving enough relief while using the spirometer.  You develop fever of 100.5 F (38.1 C) or higher. SEEK IMMEDIATE MEDICAL CARE IF:   You cough up bloody sputum that had not been present before.  You develop fever of 102 F (38.9 C) or greater.  You develop worsening pain at or near the incision site. MAKE SURE YOU:   Understand these instructions.  Will watch your condition.  Will get help right away if you are not doing well or get worse. Document Released: 04/19/2007 Document Revised: 02/29/2012 Document Reviewed: 06/20/2007 ExitCare Patient Information 2014 [employer], [place].   ________________________________________________________________________  WHAT IS A BLOOD TRANSFUSION? Blood Transfusion Information  A transfusion is the replacement of blood or some of its parts. Blood is made up of multiple cells which provide different functions.  Red blood cells carry oxygen and are used for blood loss replacement.  White blood cells fight against infection.  Platelets control bleeding.  Plasma helps clot blood.  Other blood products are available for  specialized needs, such as hemophilia or other clotting disorders. BEFORE THE TRANSFUSION  Who gives blood for transfusions?   Healthy volunteers who are fully evaluated to make sure their blood is safe. This is blood bank blood. Transfusion therapy is the safest it has ever been in the practice of medicine. Before blood is taken from a donor, a complete history is taken to make sure that person has no history of diseases nor engages in risky social behavior (examples are intravenous drug use or sexual activity with multiple partners). The donor's travel history is screened to minimize risk of transmitting infections, such as malaria. The donated blood is tested for signs of infectious diseases, such as HIV and hepatitis. The blood is then tested to be sure it is compatible with you in order to minimize the chance of a transfusion reaction. If you or a relative donates blood, this is often done in anticipation of surgery and is not appropriate for emergency situations. It takes many days to process the donated blood. RISKS AND COMPLICATIONS Although transfusion therapy is very safe and saves many lives, the main dangers of transfusion include:   Getting an infectious disease.  Developing a transfusion reaction. This is an allergic reaction to something in the blood you were given. Every precaution is taken to prevent this.  The decision to have a blood transfusion has been considered carefully by your caregiver before blood is given. Blood is not given unless the benefits outweigh the risks. AFTER THE TRANSFUSION  Right after receiving a blood transfusion, you will usually feel much better and more energetic. This is especially true if your red blood cells have gotten low (anemic). The transfusion raises the level of the red blood cells which carry oxygen, and this usually causes an energy increase.  The nurse administering the transfusion will monitor you carefully for complications. HOME CARE  INSTRUCTIONS  No special instructions are needed after a transfusion. You may find your energy is better. Speak with your caregiver about any limitations on activity for underlying diseases you may have. SEEK MEDICAL CARE IF:   Your condition is not improving after your transfusion.  You develop redness or irritation at the intravenous (IV) site. SEEK IMMEDIATE MEDICAL CARE IF:  Any of the following symptoms occur over the next 12 hours:  Shaking chills.  You have a temperature by mouth above 102 F (38.9 C), not controlled by medicine.  Chest, back, or muscle pain.  People around you feel you are not acting correctly or are confused.  Shortness of breath or difficulty breathing.  Dizziness and fainting.  You get a rash or develop hives.  You have a decrease in urine output.  Your urine turns a dark color or changes to pink, red, or brown. Any of the following symptoms occur over the next 10 days:  You have a temperature by mouth above 102 F (38.9 C), not controlled by medicine.  Shortness of breath.  Weakness after normal activity.  The white part of the eye turns yellow (jaundice).  You have a decrease in the amount of urine or are urinating less often.  Your urine turns a dark color or changes to pink, red, or brown. Document Released: 12/04/2000 Document Revised: 02/29/2012 Document Reviewed: 07/23/2008 [facility] Patient Information 2014 [place], [place].  _______________________________________________________________________

## 2016-11-24 ENCOUNTER — Inpatient Hospital Stay (HOSPITAL_COMMUNITY): Payer: 59

## 2016-11-24 ENCOUNTER — Inpatient Hospital Stay (HOSPITAL_COMMUNITY)
Admission: RE | Admit: 2016-11-24 | Discharge: 2016-11-25 | DRG: 470 | Disposition: A | Discharge status: Home Health | Payer: 59 | Source: Ambulatory Visit | Attending: Orthopedic Surgery | Admitting: Orthopedic Surgery

## 2016-11-24 ENCOUNTER — Encounter (HOSPITAL_COMMUNITY): Payer: Self-pay | Admitting: *Deleted

## 2016-11-24 ENCOUNTER — Encounter (HOSPITAL_COMMUNITY)
Admission: RE | Disposition: A | Discharge status: Home Health | Payer: Self-pay | Source: Ambulatory Visit | Attending: Orthopedic Surgery

## 2016-11-24 ENCOUNTER — Inpatient Hospital Stay (HOSPITAL_COMMUNITY): Payer: 59 | Admitting: Registered Nurse

## 2016-11-24 DIAGNOSIS — E669 Obesity, unspecified: Secondary | ICD-10-CM | POA: Diagnosis present

## 2016-11-24 DIAGNOSIS — K219 Gastro-esophageal reflux disease without esophagitis: Secondary | ICD-10-CM | POA: Diagnosis present

## 2016-11-24 DIAGNOSIS — Z823 Family history of stroke: Secondary | ICD-10-CM | POA: Diagnosis not present

## 2016-11-24 DIAGNOSIS — M1612 Unilateral primary osteoarthritis, left hip: Principal | ICD-10-CM | POA: Diagnosis present

## 2016-11-24 DIAGNOSIS — Z8249 Family history of ischemic heart disease and other diseases of the circulatory system: Secondary | ICD-10-CM | POA: Diagnosis not present

## 2016-11-24 DIAGNOSIS — E039 Hypothyroidism, unspecified: Secondary | ICD-10-CM | POA: Diagnosis present

## 2016-11-24 DIAGNOSIS — E663 Overweight: Secondary | ICD-10-CM | POA: Diagnosis present

## 2016-11-24 DIAGNOSIS — Z801 Family history of malignant neoplasm of trachea, bronchus and lung: Secondary | ICD-10-CM | POA: Diagnosis not present

## 2016-11-24 DIAGNOSIS — Z87891 Personal history of nicotine dependence: Secondary | ICD-10-CM

## 2016-11-24 DIAGNOSIS — M25552 Pain in left hip: Secondary | ICD-10-CM | POA: Diagnosis present

## 2016-11-24 DIAGNOSIS — Z96649 Presence of unspecified artificial hip joint: Secondary | ICD-10-CM

## 2016-11-24 DIAGNOSIS — I10 Essential (primary) hypertension: Secondary | ICD-10-CM | POA: Diagnosis present

## 2016-11-24 HISTORY — PX: TOTAL HIP ARTHROPLASTY: SHX124

## 2016-11-24 LAB — TYPE AND SCREEN
ABO/RH(D): A POS
Antibody Screen: NEGATIVE

## 2016-11-24 SURGERY — ARTHROPLASTY, HIP, TOTAL, ANTERIOR APPROACH
Anesthesia: Spinal | Site: Hip | Laterality: Left

## 2016-11-24 MED ORDER — 0.9 % SODIUM CHLORIDE (POUR BTL) OPTIME
TOPICAL | Status: DC | PRN
  Administered 2016-11-24: 1000 mL

## 2016-11-24 MED ORDER — ONDANSETRON HCL 4 MG/2ML IJ SOLN
INTRAMUSCULAR | Status: DC | PRN
  Administered 2016-11-24: 4 mg via INTRAVENOUS

## 2016-11-24 MED ORDER — MAGNESIUM CITRATE PO SOLN: 1.0000 | Freq: Once | ORAL | Status: DC | PRN

## 2016-11-24 MED ORDER — MEPERIDINE HCL 50 MG/ML IJ SOLN: 6.2500 mg | INTRAMUSCULAR | Status: DC | PRN

## 2016-11-24 MED ORDER — DOCUSATE SODIUM 100 MG PO CAPS
100.0000 mg | ORAL_CAPSULE | Freq: Two times a day (BID) | ORAL | Status: DC
  Administered 2016-11-24 – 2016-11-25 (×2): 100 mg via ORAL
  Filled 2016-11-24 (×2): qty 1

## 2016-11-24 MED ORDER — BISACODYL 10 MG RE SUPP: 10.0000 mg | Freq: Every day | RECTAL | Status: DC | PRN

## 2016-11-24 MED ORDER — PROPOFOL 10 MG/ML IV BOLUS
INTRAVENOUS | Status: DC | PRN
  Administered 2016-11-24: 40 mg via INTRAVENOUS

## 2016-11-24 MED ORDER — ONDANSETRON HCL 4 MG/2ML IJ SOLN
INTRAMUSCULAR | Status: AC
  Filled 2016-11-24: qty 2

## 2016-11-24 MED ORDER — MENTHOL 3 MG MT LOZG: 1.0000 | LOZENGE | OROMUCOSAL | Status: DC | PRN

## 2016-11-24 MED ORDER — FENTANYL CITRATE (PF) 100 MCG/2ML IJ SOLN
INTRAMUSCULAR | Status: AC
  Filled 2016-11-24: qty 2

## 2016-11-24 MED ORDER — CEFAZOLIN SODIUM-DEXTROSE 2-4 GM/100ML-% IV SOLN
INTRAVENOUS | Status: AC
  Filled 2016-11-24: qty 100

## 2016-11-24 MED ORDER — FENTANYL CITRATE (PF) 100 MCG/2ML IJ SOLN
INTRAMUSCULAR | Status: DC | PRN
  Administered 2016-11-24: 100 ug via INTRAVENOUS

## 2016-11-24 MED ORDER — BUPIVACAINE IN DEXTROSE 0.75-8.25 % IT SOLN
INTRATHECAL | Status: DC | PRN
  Administered 2016-11-24: 2 mL via INTRATHECAL

## 2016-11-24 MED ORDER — CEFAZOLIN SODIUM-DEXTROSE 2-4 GM/100ML-% IV SOLN
2.0000 g | Freq: Four times a day (QID) | INTRAVENOUS | Status: AC
  Administered 2016-11-24 (×2): 2 g via INTRAVENOUS
  Filled 2016-11-24 (×2): qty 100

## 2016-11-24 MED ORDER — ASPIRIN 81 MG PO CHEW
81.0000 mg | CHEWABLE_TABLET | Freq: Two times a day (BID) | ORAL | Status: DC
  Administered 2016-11-24 – 2016-11-25 (×2): 81 mg via ORAL
  Filled 2016-11-24 (×2): qty 1

## 2016-11-24 MED ORDER — PROPOFOL 10 MG/ML IV BOLUS
INTRAVENOUS | Status: AC
  Filled 2016-11-24: qty 20

## 2016-11-24 MED ORDER — TRANEXAMIC ACID 1000 MG/10ML IV SOLN
1000.0000 mg | INTRAVENOUS | Status: AC
  Administered 2016-11-24: 1000 mg via INTRAVENOUS
  Filled 2016-11-24: qty 1100

## 2016-11-24 MED ORDER — PHENYLEPHRINE HCL 10 MG/ML IJ SOLN
INTRAMUSCULAR | Status: DC | PRN
  Administered 2016-11-24: 120 ug via INTRAVENOUS
  Administered 2016-11-24: 80 ug via INTRAVENOUS

## 2016-11-24 MED ORDER — SODIUM CHLORIDE 0.9 % IV SOLN
100.0000 mL/h | INTRAVENOUS | Status: DC
  Administered 2016-11-24: 100 mL/h via INTRAVENOUS
  Filled 2016-11-24 (×3): qty 1000

## 2016-11-24 MED ORDER — DEXAMETHASONE SODIUM PHOSPHATE 10 MG/ML IJ SOLN
10.0000 mg | Freq: Once | INTRAMUSCULAR | Status: AC
  Administered 2016-11-25: 10 mg via INTRAVENOUS
  Filled 2016-11-24: qty 1

## 2016-11-24 MED ORDER — DEXTROSE 5 % IV SOLN
INTRAVENOUS | Status: DC | PRN
  Administered 2016-11-24: 30 ug/min via INTRAVENOUS

## 2016-11-24 MED ORDER — CEFAZOLIN SODIUM-DEXTROSE 2-4 GM/100ML-% IV SOLN
2.0000 g | INTRAVENOUS | Status: AC
  Administered 2016-11-24: 2 g via INTRAVENOUS
  Filled 2016-11-24: qty 100

## 2016-11-24 MED ORDER — LACTATED RINGERS IV SOLN: INTRAVENOUS | Status: DC

## 2016-11-24 MED ORDER — METOCLOPRAMIDE HCL 5 MG/ML IJ SOLN: 5.0000 mg | Freq: Three times a day (TID) | INTRAMUSCULAR | Status: DC | PRN

## 2016-11-24 MED ORDER — PROPOFOL 500 MG/50ML IV EMUL
INTRAVENOUS | Status: DC | PRN
  Administered 2016-11-24: 125 ug/kg/min via INTRAVENOUS

## 2016-11-24 MED ORDER — LEVOTHYROXINE SODIUM 175 MCG PO TABS
175.0000 ug | ORAL_TABLET | Freq: Every day | ORAL | Status: DC
  Administered 2016-11-25: 175 ug via ORAL
  Filled 2016-11-24: qty 1

## 2016-11-24 MED ORDER — DEXAMETHASONE SODIUM PHOSPHATE 10 MG/ML IJ SOLN
INTRAMUSCULAR | Status: AC
  Filled 2016-11-24: qty 1

## 2016-11-24 MED ORDER — PHENYLEPHRINE HCL 10 MG/ML IJ SOLN
INTRAMUSCULAR | Status: AC
  Filled 2016-11-24: qty 1

## 2016-11-24 MED ORDER — OMEPRAZOLE 20 MG PO CPDR
40.0000 mg | DELAYED_RELEASE_CAPSULE | Freq: Every day | ORAL | Status: DC
  Administered 2016-11-25: 40 mg via ORAL
  Filled 2016-11-24 (×2): qty 2

## 2016-11-24 MED ORDER — ALUM & MAG HYDROXIDE-SIMETH 200-200-20 MG/5ML PO SUSP: 30.0000 mL | ORAL | Status: DC | PRN

## 2016-11-24 MED ORDER — METHOCARBAMOL 500 MG PO TABS
500.0000 mg | ORAL_TABLET | Freq: Four times a day (QID) | ORAL | Status: DC | PRN
  Administered 2016-11-24 – 2016-11-25 (×3): 500 mg via ORAL
  Filled 2016-11-24 (×3): qty 1

## 2016-11-24 MED ORDER — MIDAZOLAM HCL 2 MG/2ML IJ SOLN
INTRAMUSCULAR | Status: AC
  Filled 2016-11-24: qty 2

## 2016-11-24 MED ORDER — MIDAZOLAM HCL 5 MG/5ML IJ SOLN
INTRAMUSCULAR | Status: DC | PRN
  Administered 2016-11-24: 2 mg via INTRAVENOUS

## 2016-11-24 MED ORDER — LACTATED RINGERS IV SOLN
INTRAVENOUS | Status: DC | PRN
  Administered 2016-11-24 (×3): via INTRAVENOUS

## 2016-11-24 MED ORDER — ATORVASTATIN CALCIUM 80 MG PO TABS
80.0000 mg | ORAL_TABLET | Freq: Every day | ORAL | Status: DC
  Administered 2016-11-25: 80 mg via ORAL
  Filled 2016-11-24: qty 4
  Filled 2016-11-24: qty 1

## 2016-11-24 MED ORDER — CALCIUM CARBONATE ANTACID 500 MG PO CHEW: 2.0000 | CHEWABLE_TABLET | Freq: Every day | ORAL | Status: DC | PRN

## 2016-11-24 MED ORDER — METOCLOPRAMIDE HCL 5 MG PO TABS: 5.0000 mg | ORAL_TABLET | Freq: Three times a day (TID) | ORAL | Status: DC | PRN

## 2016-11-24 MED ORDER — HYDROCODONE-ACETAMINOPHEN 7.5-325 MG PO TABS
1.0000 | ORAL_TABLET | ORAL | Status: DC
  Administered 2016-11-24 (×2): 2 via ORAL
  Administered 2016-11-24: 1 via ORAL
  Administered 2016-11-24 – 2016-11-25 (×3): 2 via ORAL
  Filled 2016-11-24 (×4): qty 2
  Filled 2016-11-24: qty 1
  Filled 2016-11-24: qty 2

## 2016-11-24 MED ORDER — DIPHENHYDRAMINE HCL 25 MG PO CAPS
25.0000 mg | ORAL_CAPSULE | Freq: Four times a day (QID) | ORAL | Status: DC | PRN
  Administered 2016-11-24: 25 mg via ORAL
  Filled 2016-11-24: qty 1

## 2016-11-24 MED ORDER — STERILE WATER FOR IRRIGATION IR SOLN
Status: DC | PRN
  Administered 2016-11-24: 2000 mL

## 2016-11-24 MED ORDER — ONDANSETRON HCL 4 MG PO TABS: 4.0000 mg | ORAL_TABLET | Freq: Four times a day (QID) | ORAL | Status: DC | PRN

## 2016-11-24 MED ORDER — CHLORHEXIDINE GLUCONATE 4 % EX LIQD: 60.0000 mL | Freq: Once | CUTANEOUS | Status: DC

## 2016-11-24 MED ORDER — METOCLOPRAMIDE HCL 5 MG/ML IJ SOLN: 10.0000 mg | Freq: Once | INTRAMUSCULAR | Status: DC | PRN

## 2016-11-24 MED ORDER — DEXAMETHASONE SODIUM PHOSPHATE 10 MG/ML IJ SOLN
10.0000 mg | Freq: Once | INTRAMUSCULAR | Status: AC
  Administered 2016-11-24: 10 mg via INTRAVENOUS

## 2016-11-24 MED ORDER — PROPOFOL 500 MG/50ML IV EMUL: INTRAVENOUS | Status: DC | PRN

## 2016-11-24 MED ORDER — HYDROMORPHONE HCL 1 MG/ML IJ SOLN
0.5000 mg | INTRAMUSCULAR | Status: DC | PRN
  Administered 2016-11-24 (×3): 1 mg via INTRAVENOUS
  Filled 2016-11-24 (×3): qty 1

## 2016-11-24 MED ORDER — ONDANSETRON HCL 4 MG/2ML IJ SOLN: 4.0000 mg | Freq: Four times a day (QID) | INTRAMUSCULAR | Status: DC | PRN

## 2016-11-24 MED ORDER — PROPOFOL 10 MG/ML IV BOLUS
INTRAVENOUS | Status: AC
  Filled 2016-11-24: qty 60

## 2016-11-24 MED ORDER — FERROUS SULFATE 325 (65 FE) MG PO TABS
325.0000 mg | ORAL_TABLET | Freq: Three times a day (TID) | ORAL | Status: DC
Start: 2016-11-24 — End: 2016-11-25
  Administered 2016-11-24 – 2016-11-25 (×2): 325 mg via ORAL
  Filled 2016-11-24: qty 1

## 2016-11-24 MED ORDER — PHENOL 1.4 % MT LIQD: 1.0000 | OROMUCOSAL | Status: DC | PRN

## 2016-11-24 MED ORDER — FENTANYL CITRATE (PF) 100 MCG/2ML IJ SOLN: 25.0000 ug | INTRAMUSCULAR | Status: DC | PRN

## 2016-11-24 MED ORDER — DEXTROSE 5 % IV SOLN
500.0000 mg | Freq: Four times a day (QID) | INTRAVENOUS | Status: DC | PRN
  Filled 2016-11-24: qty 5

## 2016-11-24 MED ORDER — BISMUTH SUBSALICYLATE 262 MG/15ML PO SUSP: 30.0000 mL | Freq: Four times a day (QID) | ORAL | Status: DC | PRN

## 2016-11-24 MED ORDER — PHENYLEPHRINE 40 MCG/ML (10ML) SYRINGE FOR IV PUSH (FOR BLOOD PRESSURE SUPPORT)
PREFILLED_SYRINGE | INTRAVENOUS | Status: AC
  Filled 2016-11-24: qty 10

## 2016-11-24 MED ORDER — POLYETHYLENE GLYCOL 3350 17 G PO PACK
17.0000 g | PACK | Freq: Two times a day (BID) | ORAL | Status: DC
  Filled 2016-11-24: qty 1

## 2016-11-24 SURGICAL SUPPLY — 39 items
BAG ZIPLOCK 12X15 (MISCELLANEOUS) ×2 IMPLANT
BLADE SAW SGTL 18X1.27X75 (BLADE) ×2 IMPLANT
CAPT HIP TOTAL 2 ×2 IMPLANT
CLOTH BEACON ORANGE TIMEOUT ST (SAFETY) ×2 IMPLANT
COVER MAYO STAND STRL (DRAPES) ×2 IMPLANT
COVER PERINEAL POST (MISCELLANEOUS) ×2 IMPLANT
DERMABOND ADVANCED (GAUZE/BANDAGES/DRESSINGS) ×1
DERMABOND ADVANCED .7 DNX12 (GAUZE/BANDAGES/DRESSINGS) ×1 IMPLANT
DRAPE STERI IOBAN 125X83 (DRAPES) ×2 IMPLANT
DRAPE U-SHAPE 47X51 STRL (DRAPES) ×4 IMPLANT
DRESSING AQUACEL AG SP 3.5X10 (GAUZE/BANDAGES/DRESSINGS) ×1 IMPLANT
DRSG AQUACEL AG ADV 3.5X10 (GAUZE/BANDAGES/DRESSINGS) ×2 IMPLANT
DRSG AQUACEL AG SP 3.5X10 (GAUZE/BANDAGES/DRESSINGS) ×2
DURAPREP 26ML APPLICATOR (WOUND CARE) ×2 IMPLANT
ELECT REM PT RETURN 9FT ADLT (ELECTROSURGICAL) ×2
ELECTRODE REM PT RTRN 9FT ADLT (ELECTROSURGICAL) ×1 IMPLANT
GLOVE BIOGEL PI IND STRL 7.0 (GLOVE) ×2 IMPLANT
GLOVE BIOGEL PI IND STRL 7.5 (GLOVE) ×4 IMPLANT
GLOVE BIOGEL PI IND STRL 8.5 (GLOVE) ×1 IMPLANT
GLOVE BIOGEL PI INDICATOR 7.0 (GLOVE) ×2
GLOVE BIOGEL PI INDICATOR 7.5 (GLOVE) ×4
GLOVE BIOGEL PI INDICATOR 8.5 (GLOVE) ×1
GLOVE ECLIPSE 8.0 STRL XLNG CF (GLOVE) ×6 IMPLANT
GLOVE ORTHO TXT STRL SZ7.5 (GLOVE) ×2 IMPLANT
GLOVE SURG SS PI 7.0 STRL IVOR (GLOVE) ×4 IMPLANT
GLOVE SURG SS PI 7.5 STRL IVOR (GLOVE) ×4 IMPLANT
GOWN SPEC L3 XXLG W/TWL (GOWN DISPOSABLE) ×2 IMPLANT
GOWN STRL REUS W/ TWL XL LVL3 (GOWN DISPOSABLE) ×1 IMPLANT
GOWN STRL REUS W/TWL LRG LVL3 (GOWN DISPOSABLE) ×2 IMPLANT
GOWN STRL REUS W/TWL XL LVL3 (GOWN DISPOSABLE) ×3 IMPLANT
HOLDER FOLEY CATH W/STRAP (MISCELLANEOUS) ×2 IMPLANT
PACK ANTERIOR HIP CUSTOM (KITS) ×2 IMPLANT
SUT MNCRL AB 4-0 PS2 18 (SUTURE) ×2 IMPLANT
SUT VIC AB 1 CT1 36 (SUTURE) ×6 IMPLANT
SUT VIC AB 2-0 CT1 27 (SUTURE) ×2
SUT VIC AB 2-0 CT1 TAPERPNT 27 (SUTURE) ×2 IMPLANT
SUT VLOC 180 0 24IN GS25 (SUTURE) ×2 IMPLANT
TRAY FOLEY W/METER SILVER 16FR (SET/KITS/TRAYS/PACK) ×2 IMPLANT
YANKAUER SUCT BULB TIP 10FT TU (MISCELLANEOUS) ×2 IMPLANT

## 2016-11-24 NOTE — Interval H&P Note (Signed)
History and Physical Interval Note:  11/24/2016 7:03 AM  Philip Humphrey  has presented today for surgery, with the diagnosis of LEFT HIP AVN  The various methods of treatment have been discussed with the patient and family. After consideration of risks, benefits and other options for treatment, the patient has consented to  Procedure(s): LEFT TOTAL HIP ARTHROPLASTY ANTERIOR APPROACH (Left) as a surgical intervention .  The patient's history has been reviewed, patient examined, no change in status, stable for surgery.  I have reviewed the patient's chart and labs.  Questions were answered to the patient's satisfaction.     Mauri Pole

## 2016-11-24 NOTE — Anesthesia Procedure Notes (Addendum)
Spinal  Patient location during procedure: OR End time: 11/24/2016 8:58 AM Staffing Resident/CRNA: Enrigue Catena E Performed: anesthesiologist  Preanesthetic Checklist Completed: patient identified, site marked, surgical consent, pre-op evaluation, timeout performed, IV checked, risks and benefits discussed and monitors and equipment checked Spinal Block Patient position: sitting Prep: DuraPrep Patient monitoring: heart rate, continuous pulse ox and blood pressure Approach: right paramedian Location: L2-3 Injection technique: single-shot Needle Needle type: Pencan  Needle gauge: 25 G Needle length: 9 cm Assessment Sensory level: T4 Additional Notes Expiration date of kit checked and confirmed. Patient tolerated procedure well, without complications.

## 2016-11-24 NOTE — Anesthesia Postprocedure Evaluation (Signed)
Anesthesia Post Note  Patient: Raydan Laudermilch Sebo  Procedure(s) Performed: Procedure(s) (LRB): LEFT TOTAL HIP ARTHROPLASTY ANTERIOR APPROACH (Left)  Patient location during evaluation: PACU Anesthesia Type: Spinal Level of consciousness: awake and alert Pain management: pain level controlled Vital Signs Assessment: post-procedure vital signs reviewed and stable Respiratory status: spontaneous breathing and respiratory function stable Cardiovascular status: blood pressure returned to baseline and stable Postop Assessment: no headache, no backache and spinal receding Anesthetic complications: no    Last Vitals:  Vitals:   11/24/16 1200 11/24/16 1209  BP: 114/78 120/83  Pulse: 84 85  Resp: 14 15  Temp: 36.4 C     Last Pain:  Vitals:   11/24/16 1217  TempSrc:   PainSc: 2                  Montez Hageman

## 2016-11-24 NOTE — Evaluation (Signed)
Physical Therapy Evaluation Patient Details Name: Philip Humphrey MRN: [id number] DOB: 12/05/1961 Today's Date: 11/24/2016   History of Present Illness  s/p L DA THA  Clinical Impression  Pt is s/p THA resulting in the deficits listed below (see PT Problem List). * Pt will benefit from skilled PT to increase their independence and safety with mobility to allow discharge to the venue listed below.      Follow Up Recommendations Home health PT;No PT follow up (f/u per MD)    Equipment Recommendations  Rolling walker with 5" wheels    Recommendations for Other Services       Precautions / Restrictions Precautions Precautions: Fall Restrictions Weight Bearing Restrictions: No LLE Weight Bearing: Weight bearing as tolerated      Mobility  Bed Mobility Overal bed mobility: Needs Assistance Bed Mobility: Supine to Sit     Supine to sit: Min assist     General bed mobility comments: with LLE  Transfers Overall transfer level: Needs assistance Equipment used: Rolling walker (2 wheeled) Transfers: Sit to/from Stand Sit to Stand: Min assist         General transfer comment: cues for wt shift, hand placment and LLE position  Ambulation/Gait Ambulation/Gait assistance: Min assist Ambulation Distance (Feet): 120 Feet Assistive device: Rolling walker (2 wheeled) Gait Pattern/deviations: Step-to pattern;Step-through pattern;Decreased stride length;Decreased weight shift to left     General Gait Details: cues for sequence and RW safety  Stairs            Wheelchair Mobility    Modified Rankin (Stroke Patients Only)       Balance Overall balance assessment: History of Falls                                           Pertinent Vitals/Pain Pain Assessment: 0-10 Pain Score: 3  Pain Location: L hip Pain Descriptors / Indicators: Sore;Tightness Pain Intervention(s): Limited activity within patient's tolerance;Monitored during  session;Premedicated before session;Repositioned    Home Living Family/patient expects to be discharged to:: Private residence Living Arrangements: Spouse/significant other   Type of Home: House Home Access: Stairs to enter   Technical brewer of Steps: 4 Home Layout: One level Home Equipment: Environmental consultant - 2 wheels;Cane - single point;Bedside commode      Prior Function Level of Independence: Independent               Hand Dominance        Extremity/Trunk Assessment   Upper Extremity Assessment: Overall WFL for tasks assessed           Lower Extremity Assessment: LLE deficits/detail   LLE Deficits / Details: ankle WFL, hip grossly 3/5, limited by post op pain/weakness     Communication   Communication: No difficulties  Cognition Arousal/Alertness: Awake/alert Behavior During Therapy: WFL for tasks assessed/performed Overall Cognitive Status: Within Functional Limits for tasks assessed                      General Comments      Exercises Total Joint Exercises Ankle Circles/Pumps: AROM;Both;10 reps Quad Sets: AROM;Both;10 reps   Assessment/Plan    PT Assessment Patient needs continued PT services  PT Problem List Decreased strength;Decreased range of motion;Decreased activity tolerance;Decreased mobility;Decreased knowledge of use of DME;Pain          PT Treatment Interventions DME instruction;Gait training;Functional mobility training;Stair training;Therapeutic  activities;Therapeutic exercise;Patient/family education    PT Goals (Current goals can be found in the Care Plan section)  Acute Rehab PT Goals Patient Stated Goal: have less pain PT Goal Formulation: With patient Time For Goal Achievement: 11/27/16 Potential to Achieve Goals: Good    Frequency 7X/week   Barriers to discharge        Co-evaluation               End of Session Equipment Utilized During Treatment: Gait belt Activity Tolerance: Patient tolerated  treatment well Patient left: in chair;with call bell/phone within reach;with chair alarm set           Time: [id number] PT Time Calculation (min) (ACUTE ONLY): 17 min   Charges:   PT Evaluation $PT Eval Low Complexity: 1 Procedure     PT G CodesKenyon Ana 2016/12/19, 4:37 PM

## 2016-11-24 NOTE — Transfer of Care (Signed)
Immediate Anesthesia Transfer of Care Note  Patient: Philip Humphrey  Procedure(s) Performed: Procedure(s): LEFT TOTAL HIP ARTHROPLASTY ANTERIOR APPROACH (Left)  Patient Location: PACU  Anesthesia Type:Spinal  Level of Consciousness: awake, oriented and patient cooperative  Airway & Oxygen Therapy: Patient Spontanous Breathing and Patient connected to face mask oxygen  Post-op Assessment: Report given to RN and Post -op Vital signs reviewed and stable  Post vital signs: Reviewed and stable  Last Vitals:  Vitals:   11/24/16 0610  BP: 130/84  Pulse: 92  Resp: 18  Temp: 36.7 C    Last Pain:  Vitals:   11/24/16 0634  TempSrc:   PainSc: 1       Patients Stated Pain Goal: 3 ([id number])  Complications: No apparent anesthesia complications

## 2016-11-24 NOTE — Op Note (Signed)
NAME:  Philip Humphrey                ACCOUNT NO.: [id number]      MEDICAL RECORD NO.: [id number]      FACILITY:  [facility]      PHYSICIAN:  Paralee Cancel D  DATE OF BIRTH:  10-27-1961     DATE OF PROCEDURE:  11/24/2016                                 OPERATIVE REPORT         PREOPERATIVE DIAGNOSIS: Left  hip osteoarthritis.      POSTOPERATIVE DIAGNOSIS:  Left hip osteoarthritis.      PROCEDURE:  Left total hip replacement through an anterior approach   utilizing DePuy THR system, component size 72mm pinnacle cup, a size 36+4 neutral   Altrex liner, a size 6 hi Tri Lock stem with a 36+5 delta ceramic   ball.      SURGEON:  Pietro Cassis. Alvan Dame, M.D.      ASSISTANT:  Danae Orleans, PA-C     ANESTHESIA:  Spinal.      SPECIMENS:  None.      COMPLICATIONS:  None.      BLOOD LOSS:  450 cc     DRAINS:  None.      INDICATION OF THE PROCEDURE:  Philip Humphrey is a 55 y.o. male who had   presented to office for evaluation of left hip pain.  Radiographs revealed   progressive degenerative changes with bone-on-bone   articulation to the  hip joint.  The patient had painful limited range of   motion significantly affecting their overall quality of life.  The patient was failing to    respond to conservative measures, and at this point was ready   to proceed with more definitive measures.  The patient has noted progressive   degenerative changes in his hip, progressive problems and dysfunction   with regarding the hip prior to surgery.  Consent was obtained for   benefit of pain relief.  Specific risk of infection, DVT, component   failure, dislocation, need for revision surgery, as well discussion of   the anterior versus posterior approach were reviewed.  Consent was   obtained for benefit of anterior pain relief through an anterior   approach.      PROCEDURE IN DETAIL:  The patient was brought to operative theater.   Once adequate anesthesia,  preoperative antibiotics, 2gm of Ancef, 1 gm of Tranexamic Acis, and 10 mg of Decadron administered.   The patient was positioned supine on the OSI Hanna table.  Once adequate   padding of boney process was carried out, we had predraped out the hip, and  used fluoroscopy to confirm orientation of the pelvis and position.      The left hip was then prepped and draped from proximal iliac crest to   mid thigh with shower curtain technique.      Time-out was performed identifying the patient, planned procedure, and   extremity.     An incision was then made 2 cm distal and lateral to the   anterior superior iliac spine extending over the orientation of the   tensor fascia lata muscle and sharp dissection was carried down to the   fascia of the muscle and protractor placed in the soft tissues.      The fascia was  then incised.  The muscle belly was identified and swept   laterally and retractor placed along the superior neck.  Following   cauterization of the circumflex vessels and removing some pericapsular   fat, a second cobra retractor was placed on the inferior neck.  A third   retractor was placed on the anterior acetabulum after elevating the   anterior rectus.  A L-capsulotomy was along the line of the   superior neck to the trochanteric fossa, then extended proximally and   distally.  Tag sutures were placed and the retractors were then placed   intracapsular.  We then identified the trochanteric fossa and   orientation of my neck cut, confirmed this radiographically   and then made a neck osteotomy with the femur on traction.  The femoral   head was removed without difficulty or complication.  Traction was let   off and retractors were placed posterior and anterior around the   acetabulum.      The labrum and foveal tissue were debrided.  I began reaming with a 22mm   reamer and reamed up to 37mm reamer with good bony bed preparation and a 30mm   cup was chosen.  The final 41mm  Pinnacle cup was then impacted under fluoroscopy  to confirm the depth of penetration and orientation with respect to   abduction.  A screw was placed followed by the hole eliminator.  The final   36+4 neutral Altrex liner was impacted with good visualized rim fit.  The cup was positioned anatomically within the acetabular portion of the pelvis.      At this point, the femur was rolled at 80 degrees.  Further capsule was   released off the inferior aspect of the femoral neck.  I then   released the superior capsule proximally.  The hook was placed laterally   along the femur and elevated manually and held in position with the bed   hook.  The leg was then extended and adducted with the leg rolled to 100   degrees of external rotation.  Once the proximal femur was fully   exposed, I used a box osteotome to set orientation.  I then began   broaching with the starting chili pepper broach and passed this by hand and then broached up to 6.  With the 6 broach in place I chose a high offset neck and did several trial reductions.  The offset was appropriate, leg lengths   appeared to be equal, confirmed radiographically.   Given these findings, I went ahead and dislocated the hip, repositioned all   retractors and positioned the right hip in the extended and abducted position.  The final 6 Hi Tri Lock stem was   chosen and it was impacted down to the level of neck cut.  Based on this   and the trial reduction, a 36+5 delta ceramic ball was chosen and   impacted onto a clean and dry trunnion, and the hip was reduced.  The   hip had been irrigated throughout the case again at this point.  I did   reapproximate the superior capsular leaflet to the anterior leaflet   using #1 Vicryl.  The fascia of the   tensor fascia lata muscle was then reapproximated using #1 Vicryl and #0 V-lock sutures.  The   remaining wound was closed with 2-0 Vicryl and running 4-0 Monocryl.   The hip was cleaned, dried, and  dressed sterilely using Dermabond and  Aquacel dressing.  He was then brought   to recovery room in stable condition tolerating the procedure well.    Danae Orleans, PA-C was present for the entirety of the case involved from   preoperative positioning, perioperative retractor management, general   facilitation of the case, as well as primary wound closure as assistant.            Pietro Cassis Alvan Dame, M.D.        11/24/2016 10:32 AM

## 2016-11-24 NOTE — Anesthesia Preprocedure Evaluation (Signed)
Anesthesia Evaluation  Patient identified by MRN, date of birth, ID band Patient awake    Reviewed: Allergy & Precautions, NPO status , Patient's Chart, lab work & pertinent test results  Airway Mallampati: II  TM Distance: >3 FB Neck ROM: Full    Dental no notable dental hx. (+) Caps   Pulmonary neg pulmonary ROS, former smoker,    Pulmonary exam normal breath sounds clear to auscultation       Cardiovascular hypertension, Pt. on medications negative cardio ROS Normal cardiovascular exam Rhythm:Regular Rate:Normal     Neuro/Psych negative neurological ROS  negative psych ROS   GI/Hepatic Neg liver ROS, GERD  Medicated and Controlled,  Endo/Other  Hypothyroidism   Renal/GU negative Renal ROS  negative genitourinary   Musculoskeletal negative musculoskeletal ROS (+)   Abdominal   Peds negative pediatric ROS (+)  Hematology negative hematology ROS (+)   Anesthesia Other Findings   Reproductive/Obstetrics negative OB ROS                             Anesthesia Physical Anesthesia Plan  ASA: II  Anesthesia Plan: Spinal   Post-op Pain Management:    Induction:   Airway Management Planned: Simple Face Mask  Additional Equipment:   Intra-op Plan:   Post-operative Plan:   Informed Consent: I have reviewed the patients History and Physical, chart, labs and discussed the procedure including the risks, benefits and alternatives for the proposed anesthesia with the patient or authorized representative who has indicated his/her understanding and acceptance.   Dental advisory given  Plan Discussed with: CRNA  Anesthesia Plan Comments:         Anesthesia Quick Evaluation

## 2016-11-25 DIAGNOSIS — E669 Obesity, unspecified: Secondary | ICD-10-CM | POA: Diagnosis present

## 2016-11-25 LAB — BASIC METABOLIC PANEL
ANION GAP: 6 (ref 5–15)
BUN: 14 mg/dL (ref 6–20)
CALCIUM: 8.6 mg/dL — AB (ref 8.9–10.3)
CHLORIDE: 106 mmol/L (ref 101–111)
CO2: 26 mmol/L (ref 22–32)
Creatinine, Ser: 0.89 mg/dL (ref 0.61–1.24)
GFR calc Af Amer: >60 mL/min (ref >60)
GFR calc non Af Amer: >60 mL/min (ref >60)
Glucose, Bld: 188 mg/dL — ABNORMAL HIGH (ref 65–99)
POTASSIUM: 4.4 mmol/L (ref 3.5–5.1)
Sodium: 138 mmol/L (ref 135–145)

## 2016-11-25 LAB — CBC
HEMATOCRIT: 37.3 % — AB (ref 39.0–52.0)
HEMOGLOBIN: 12.7 g/dL — AB (ref 13.0–17.0)
MCH: 33.6 pg (ref 26.0–34.0)
MCHC: 34 g/dL (ref 30.0–36.0)
MCV: 98.7 fL (ref 78.0–100.0)
Platelets: 188 10*3/uL (ref 150–400)
RBC: 3.78 MIL/uL — AB (ref 4.22–5.81)
RDW: 12.6 % (ref 11.5–15.5)
WBC: 18.8 10*3/uL — AB (ref 4.0–10.5)

## 2016-11-25 MED ORDER — ASPIRIN 81 MG PO CHEW: 81.0000 mg | CHEWABLE_TABLET | Freq: Two times a day (BID) | ORAL | 0 refills | Status: DC

## 2016-11-25 MED ORDER — POLYETHYLENE GLYCOL 3350 17 G PO PACK: 17.0000 g | PACK | Freq: Two times a day (BID) | ORAL | 0 refills | Status: DC

## 2016-11-25 MED ORDER — DOCUSATE SODIUM 100 MG PO CAPS: 100.0000 mg | ORAL_CAPSULE | Freq: Two times a day (BID) | ORAL | 0 refills | Status: DC

## 2016-11-25 MED ORDER — METHOCARBAMOL 500 MG PO TABS: 500.0000 mg | ORAL_TABLET | Freq: Four times a day (QID) | ORAL | 0 refills | Status: DC | PRN

## 2016-11-25 MED ORDER — FERROUS SULFATE 325 (65 FE) MG PO TABS: 325.0000 mg | ORAL_TABLET | Freq: Three times a day (TID) | ORAL | 3 refills | Status: DC

## 2016-11-25 MED ORDER — HYDROCODONE-ACETAMINOPHEN 7.5-325 MG PO TABS: 1.0000 | ORAL_TABLET | ORAL | 0 refills | Status: DC | PRN

## 2016-11-25 NOTE — Care Management Note (Signed)
Case Management Note  Patient Details  Name: Philip Humphrey MRN: [id number] Date of Birth: [birth year]  Subjective/Objective:                  LEFT TOTAL HIP ARTHROPLASTY ANTERIOR APPROACH (Left) Action/Plan: Discharge planning Expected Discharge Date:  11/25/16               Expected Discharge Plan:  [place]  In-House Referral:     Discharge planning Services  CM Consult  Post Acute Care Choice:  Home Health Choice offered to:  Patient  DME Arranged:  Walker rolling DME Agency:  [place]:  PT [place] Agency:  [facility]  Status of Service:  Completed, signed off  If discussed at [employer] of Stay Meetings, dates discussed:    Additional Comments: CMmet with pt in room to o ffer choice of home health agency.  Pt chooses [facility] to render HHPT.  Referral given to [facility] rep, Tim.  CM notified [facility] rep, Reggie to please deliver the rolling walker to room prior to discharge.  No other CM needs were communicated. Dellie Catholic, RN 11/25/2016, 10:11 AM

## 2016-11-25 NOTE — Progress Notes (Signed)
Rolling walker delivered, PT Hunter seeing patient for 2nd visit today. Patient reports hospice being called in for patients father. Reviewed discharge information with patient and wife and both state understanding. No questions at this time.

## 2016-11-25 NOTE — Progress Notes (Signed)
Physical Therapy Treatment Patient Details Name: Philip Humphrey MRN: [id number] DOB: 1961/07/20 Today's Date: 11/25/2016    History of Present Illness s/p L DA THA    PT Comments    Pt progressing well with mobility and eager for dc home.  Follow Up Recommendations  Home health PT     Equipment Recommendations  Rolling walker with 5" wheels    Recommendations for Other Services       Precautions / Restrictions Precautions Precautions: Fall Restrictions Weight Bearing Restrictions: No LLE Weight Bearing: Weight bearing as tolerated    Mobility  Bed Mobility Overal bed mobility: Needs Assistance Bed Mobility: Supine to Sit;Sit to Supine     Supine to sit: Min guard Sit to supine: Supervision   General bed mobility comments: pt self assisting with R LE  Transfers Overall transfer level: Needs assistance Equipment used: Rolling walker (2 wheeled) Transfers: Sit to/from Stand Sit to Stand: Min guard;Supervision         General transfer comment: for safety  Ambulation/Gait Ambulation/Gait assistance: Min guard;Supervision Ambulation Distance (Feet): 222 Feet Assistive device: Rolling walker (2 wheeled) Gait Pattern/deviations: Step-to pattern;Decreased step length - right;Decreased step length - left;Shuffle;Trunk flexed Gait velocity: decr Gait velocity interpretation: Below normal speed for age/gender General Gait Details: cues for posture, position from RW and initial sequence   Stairs Stairs: Yes Stairs assistance: Min guard Stair Management: Two rails;Step to pattern;Forwards Number of Stairs: 2 General stair comments: Pt self cued for sequence and foot placement  Wheelchair Mobility    Modified Rankin (Stroke Patients Only)       Balance                                    Cognition Arousal/Alertness: Awake/alert Behavior During Therapy: WFL for tasks assessed/performed Overall Cognitive Status: Within Functional  Limits for tasks assessed                      Exercises Total Joint Exercises Ankle Circles/Pumps: AROM;Both;20 reps;Supine Quad Sets: AROM;Both;10 reps Heel Slides: AAROM;20 reps;Supine;Left Hip ABduction/ADduction: AAROM;Left;15 reps;Supine    General Comments        Pertinent Vitals/Pain Pain Assessment: 0-10 Pain Score: 3  Pain Location: L hip/thigh Pain Descriptors / Indicators: Aching;Sore Pain Intervention(s): Limited activity within patient's tolerance;Monitored during session;Premedicated before session;Ice applied    Home Living Family/patient expects to be discharged to:: Private residence Living Arrangements: Spouse/significant other Available Help at Discharge: Family         Home Equipment: Gilford Rile - 2 wheels;Cane - single point;Bedside commode;Tub bench Additional Comments: wife has back trouble, but pt feels she can assist with adls as needed    Prior Function Level of Independence: Independent          PT Goals (current goals can now be found in the care plan section) Acute Rehab PT Goals Patient Stated Goal: have less pain PT Goal Formulation: With patient Time For Goal Achievement: 11/27/16 Potential to Achieve Goals: Good Progress towards PT goals: Progressing toward goals    Frequency    7X/week      PT Plan Current plan remains appropriate    Co-evaluation             End of Session Equipment Utilized During Treatment: Gait belt Activity Tolerance: Patient tolerated treatment well Patient left: in chair;with call bell/phone within reach;with chair alarm set     Time:  [id number] PT Time Calculation (min) (ACUTE ONLY): 32 min  Charges:  $Gait Training: 8-22 mins $Therapeutic Exercise: 8-22 mins                    G Codes:      Rowyn Spilde 11/27/2016, 12:22 PM

## 2016-11-25 NOTE — Progress Notes (Signed)
     Subjective: 1 Day Post-Op Procedure(s) (LRB): LEFT TOTAL HIP ARTHROPLASTY ANTERIOR APPROACH (Left)   Patient reports pain as mild, pain controlled.  Feels better than prior to surgery.  No events throughout the night.   Objective:   VITALS:   Vitals:   11/25/16 0135 11/25/16 0518  BP: 130/75 138/82  Pulse: (!) 108 (!) 102  Resp: 18 16  Temp: 98.2 F (36.8 C) 98.4 F (36.9 C)    Dorsiflexion/Plantar flexion intact Incision: dressing C/D/I No cellulitis present Compartment soft  LABS  Recent Labs  11/25/16 0409  HGB 12.7*  HCT 37.3*  WBC 18.8*  PLT 188     Recent Labs  11/25/16 0409  NA 138  K 4.4  BUN 14  CREATININE 0.89  GLUCOSE 188*     Assessment/Plan: 1 Day Post-Op Procedure(s) (LRB): LEFT TOTAL HIP ARTHROPLASTY ANTERIOR APPROACH (Left) Foley cath d/c'ed Advance diet Up with therapy D/C IV fluids Discharge home with home health  Follow up in 2 weeks at [facility]. Follow up with OLIN,Torrez Renfroe D in 2 weeks.  Contact information:  [facility] [address] [place] [id number]    Obese (BMI 30-39.9) Estimated body mass index is 34.56 kg/m as calculated from the following:   Height as of this encounter: 5\' 9"  (1.753 m).   Weight as of this encounter: 106.1 kg (234 lb). Patient also counseled that weight may inhibit the healing process Patient counseled that losing weight will help with future health issues        Philip Humphrey. Philip Humphrey   PAC  11/25/2016, 9:28 AM

## 2016-11-25 NOTE — Evaluation (Signed)
Occupational Therapy Evaluation Patient Details Name: TRAJEN SOWLE MRN: [id number] DOB: 05/26/1961 Today's Date: 11/25/2016    History of Present Illness s/p L DA THA   Clinical Impression   This 55 year old man was admitted for the above sx. All education was completed. No further OT is needed at this time    Follow Up Recommendations  No OT follow up    Equipment Recommendations  None recommended by OT    Recommendations for Other Services       Precautions / Restrictions Precautions Precautions: Fall Restrictions Weight Bearing Restrictions: No LLE Weight Bearing: Weight bearing as tolerated      Mobility Bed Mobility               General bed mobility comments: oob  Transfers   Equipment used: Rolling walker (2 wheeled)   Sit to Stand: Min guard         General transfer comment: for safety    Balance                                            ADL Overall ADL's : Needs assistance/impaired     Grooming: Supervision/safety;Standing       Lower Body Bathing: Minimal assistance;Sit to/from stand       Lower Body Dressing: Sit to/from stand;Moderate assistance;With adaptive equipment   Toilet Transfer: Min guard;Ambulation;BSC;RW   Toileting- Water quality scientist and Hygiene: Min guard;Sit to/from stand         General ADL Comments: pt can perform UB adls with set up.  Used reacher for pants and underwear.  Pt does not have this.  He has difficulty lifting LLE and has pain in R knee, so crossing leg underneath, is not ideal.  Pt feels wife will be able to help him.  He doesn't like tub bench and will probably sponge bathe initially     Vision     Perception     Praxis      Pertinent Vitals/Pain Pain Score: 2  Pain Location: L hip  Pain Descriptors / Indicators: Sore Pain Intervention(s): Limited activity within patient's tolerance;Monitored during session;Premedicated before session;Repositioned;Ice  applied     Hand Dominance     Extremity/Trunk Assessment Upper Extremity Assessment Upper Extremity Assessment: Overall WFL for tasks assessed           Communication Communication Communication: No difficulties   Cognition Arousal/Alertness: Awake/alert Behavior During Therapy: WFL for tasks assessed/performed Overall Cognitive Status: Within Functional Limits for tasks assessed                     General Comments       Exercises       Shoulder Instructions      Home Living Family/patient expects to be discharged to:: Private residence Living Arrangements: Spouse/significant other Available Help at Discharge: Family               Bathroom Shower/Tub: Tub/shower unit Shower/tub characteristics: Curtain Biochemist, clinical: Standard     Home Equipment: Environmental consultant - 2 wheels;Cane - single point;Bedside commode;Tub bench   Additional Comments: wife has back trouble, but pt feels she can assist with adls as needed      Prior Functioning/Environment Level of Independence: Independent                 OT Problem List:  OT Treatment/Interventions:      OT Goals(Current goals can be found in the care plan section) Acute Rehab OT Goals Patient Stated Goal: have less pain OT Goal Formulation: All assessment and education complete, DC therapy  OT Frequency:     Barriers to D/C:            Co-evaluation              End of Session    Activity Tolerance: Patient tolerated treatment well Patient left: in chair;with call bell/phone within reach;with chair alarm set   Time: [id number] OT Time Calculation (min): 13 min Charges:  OT General Charges $OT Visit: 1 Procedure OT Evaluation $OT Eval Low Complexity: 1 Procedure G-Codes:    Florentina Marquart 12-18-16, 9:37 AM  Lesle Chris, OTR/L [phone] 2016-12-18

## 2016-11-25 NOTE — Progress Notes (Signed)
Physical Therapy Treatment Patient Details Name: Philip Humphrey MRN: [id number] DOB: 21-May-1961 Today's Date: 11/25/2016    History of Present Illness s/p L DA THA    PT Comments    Pt eager for dc home.  Reviewed therex and car transfers.  Follow Up Recommendations  Home health PT     Equipment Recommendations  Rolling walker with 5" wheels    Recommendations for Other Services       Precautions / Restrictions Precautions Precautions: Fall Restrictions Weight Bearing Restrictions: No LLE Weight Bearing: Weight bearing as tolerated    Mobility  Bed Mobility Overal bed mobility: Needs Assistance Bed Mobility: Supine to Sit;Sit to Supine     Supine to sit: Min guard Sit to supine: Supervision   General bed mobility comments: NT  Transfers Overall transfer level: Needs assistance Equipment used: Rolling walker (2 wheeled) Transfers: Sit to/from Stand Sit to Stand: Supervision         General transfer comment: for safety  Ambulation/Gait Ambulation/Gait assistance: Supervision Ambulation Distance (Feet): 40 Feet Assistive device: Rolling walker (2 wheeled) Gait Pattern/deviations: Step-through pattern Gait velocity: decr Gait velocity interpretation: Below normal speed for age/gender General Gait Details: min cues for position from RW   Stairs Stairs: Yes Stairs assistance: Min guard Stair Management: Two rails;Step to pattern;Forwards Number of Stairs: 2 General stair comments: Pt self cued for sequence and foot placement  Wheelchair Mobility    Modified Rankin (Stroke Patients Only)       Balance                                    Cognition Arousal/Alertness: Awake/alert Behavior During Therapy: WFL for tasks assessed/performed Overall Cognitive Status: Within Functional Limits for tasks assessed                      Exercises Total Joint Exercises Ankle Circles/Pumps: AROM;Both;20 reps;Supine Quad Sets:  AROM;Both;10 reps Heel Slides: AAROM;20 reps;Supine;Left Hip ABduction/ADduction: AAROM;Left;15 reps;Supine    General Comments        Pertinent Vitals/Pain Pain Assessment: 0-10 Pain Score: 3  Pain Location: L hi/thigh Pain Descriptors / Indicators: Aching;Sore Pain Intervention(s): Limited activity within patient's tolerance;Monitored during session;Premedicated before session;Ice applied    Home Living Family/patient expects to be discharged to:: Private residence Living Arrangements: Spouse/significant other Available Help at Discharge: Family         Home Equipment: Gilford Rile - 2 wheels;Cane - single point;Bedside commode;Tub bench Additional Comments: wife has back trouble, but pt feels she can assist with adls as needed    Prior Function Level of Independence: Independent          PT Goals (current goals can now be found in the care plan section) Acute Rehab PT Goals Patient Stated Goal: have less pain PT Goal Formulation: With patient Time For Goal Achievement: 11/27/16 Potential to Achieve Goals: Good Progress towards PT goals: Progressing toward goals    Frequency    7X/week      PT Plan Current plan remains appropriate    Co-evaluation             End of Session Equipment Utilized During Treatment: Gait belt Activity Tolerance: Patient tolerated treatment well Patient left: in chair;with call bell/phone within reach;with chair alarm set     Time: [id number] PT Time Calculation (min) (ACUTE ONLY): 10 min  Charges:  $Gait Training: 8-22 mins $Therapeutic Exercise:  8-22 mins $Therapeutic Activity: 8-22 mins                    G Codes:      Aryanne Gilleland 2016/12/17, 12:28 PM

## 2016-11-30 NOTE — Discharge Summary (Signed)
Physician Discharge Summary  Patient ID: Philip Humphrey MRN: [id number] DOB/AGE: 24-Jan-1961 55 y.o.  Admit date: 11/24/2016 Discharge date: 11/25/2016   Procedures:  Procedure(s) (LRB): LEFT TOTAL HIP ARTHROPLASTY ANTERIOR APPROACH (Left)  Attending Physician:  Dr. Paralee Cancel   Admission Diagnoses:   Left hip AVN / pain  Discharge Diagnoses:  Principal Problem:   S/P left THA, AA Active Problems:   Obese  Past Medical History:  Diagnosis Date  . Allergy    occ. seasonal  . Anxiety   . Arthritis    Avascular necrosis left  hip, most joints painful  . Colon polyp   . Condyloma    anal  . GERD (gastroesophageal reflux disease)   . History of transfusion    as child  . Hypertension   . Knee injury    right knee cap w piece broken off- MRI done 02-06-15-pending surgery. 11-18-16 right knee is still painful, both shoulders(limited ROM right).  . Knee pain   . 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 Palpitations    normal stress test 10 yrs ago  . 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 male, has a history of pain and functional disability in the left hip(s) due to arthritis and patient has failed non-surgical conservative treatments for greater than 12 weeks to include NSAID's and/or analgesics, corticosteriod injections, use of assistive devices and activity modification.  Onset of symptoms was gradual starting  years ago with gradually worsening course since that time.The patient noted no past surgery on the left hip(s).  Patient currently rates pain in the left hip at 10 out of 10 with activity. Patient has night pain, worsening of pain with activity and weight bearing, trendelenberg gait, pain that interfers with activities of daily living and pain with passive range of motion. Patient has evidence of periarticular osteophytes and joint space  narrowing by imaging studies. This condition presents safety issues increasing the risk of falls. There is no current active infection.   Risks, benefits and expectations were discussed with the patient.  Risks including but not limited to the risk of anesthesia, blood clots, nerve damage, blood vessel damage, failure of the prosthesis, infection and up to and including death.  Patient understand the risks, benefits and expectations and wishes to proceed with surgery.   PCP: Andria Frames, MD   Discharged Condition: good  Hospital Course:  Patient underwent the above stated procedure on 11/24/2016. Patient tolerated the procedure well and brought to the recovery room in good condition and subsequently to the floor.  POD #1 BP: 138/82 ; Pulse: 102 ; Temp: 98.4 F (36.9 C) ; Resp: 16 Patient reports pain as mild, pain controlled.  Feels better than prior to surgery.  No events throughout the night.  Ready to be discharged home. Dorsiflexion/plantar flexion intact, incision: dressing C/D/I, no cellulitis present and compartment soft.   LABS  Basename    HGB     12.7  HCT     37.3    Discharge Exam: General appearance: alert, cooperative and no distress Extremities: Homans sign is negative, no sign of DVT, no edema, redness or tenderness in the calves or thighs and no ulcers, gangrene or trophic changes  Disposition: Home with follow up in 2 weeks   Follow-up Information    Mauri Pole, MD. Schedule an appointment as  soon as possible for a visit in 2 week(s).   Specialty:  Orthopedic Surgery Contact information: [address] [place] [address] [phone]        [facility] Follow up.   Why:  rolling walker Contact information: [place] [address] [phone]        [facility] Follow up.   Specialty:  Home Health Services Why:  home health agency Contact information: [place] [address] [phone]           Discharge Instructions    Call MD / Call 911    Complete by:  As directed    If you experience chest pain or shortness of breath, CALL 911 and be transported to the hospital emergency room.  If you develope a fever above 101 F, pus (white drainage) or increased drainage or redness at the wound, or calf pain, call your surgeon's office.   Change dressing    Complete by:  As directed    Maintain surgical dressing until follow up in the clinic. If the edges start to pull up, may reinforce with tape. If the dressing is no longer working, may remove and cover with gauze and tape, but must keep the area dry and clean.  Call with any questions or concerns.   Constipation Prevention    Complete by:  As directed    Drink plenty of fluids.  Prune juice may be helpful.  You may use a stool softener, such as Colace (over the counter) 100 mg twice a day.  Use MiraLax (over the counter) for constipation as needed.   Diet - low sodium heart healthy    Complete by:  As directed    Discharge instructions    Complete by:  As directed    Maintain surgical dressing until follow up in the clinic. If the edges start to pull up, may reinforce with tape. If the dressing is no longer working, may remove and cover with gauze and tape, but must keep the area dry and clean.  Follow up in 2 weeks at [facility] Call with any questions or concerns.   Increase activity slowly as tolerated    Complete by:  As directed    Weight bearing as tolerated with assist device (walker, cane, etc) as directed, use it as long as suggested by your surgeon or therapist, typically at least 4-6 weeks.   TED hose    Complete by:  As directed    Use stockings (TED hose) for 2 weeks on both leg(s).  You may remove them at night for sleeping.        Medication List    STOP taking these medications   acetaminophen 500 MG tablet Commonly known as:  TYLENOL    diphenhydramine-acetaminophen 25-500 MG Tabs tablet Commonly known as:  TYLENOL PM   HYDROcodone-acetaminophen 5-325 MG tablet Commonly known as:  NORCO Replaced by:  HYDROcodone-acetaminophen 7.5-325 MG tablet   traMADol 50 MG tablet Commonly known as:  ULTRAM     TAKE these medications   aspirin 81 MG chewable tablet Chew 1 tablet (81 mg total) by mouth 2 (two) times daily. Take for 4 weeks.   atorvastatin 80 MG tablet Commonly known as:  LIPITOR Take 80 mg by mouth daily.   bismuth subsalicylate [id number] suspension Commonly known as:  PEPTO BISMOL Take 30 mLs by mouth every 6 (six) hours as needed  for indigestion or diarrhea or loose stools.   calcium carbonate 500 MG chewable tablet Commonly known as:  TUMS - dosed in mg elemental calcium Chew 2 tablets by mouth daily as needed for indigestion or heartburn.   docusate sodium 100 MG capsule Commonly known as:  COLACE Take 1 capsule (100 mg total) by mouth 2 (two) times daily.   ferrous sulfate 325 (65 FE) MG tablet Take 1 tablet (325 mg total) by mouth 3 (three) times daily after meals.   HYDROcodone-acetaminophen 7.5-325 MG tablet Commonly known as:  NORCO Take 1-2 tablets by mouth every 4 (four) hours as needed for moderate pain. Replaces:  HYDROcodone-acetaminophen 5-325 MG tablet   lisinopril 20 MG tablet Commonly known as:  PRINIVIL,ZESTRIL Take 20 mg by mouth daily. What changed:  Another medication with the same name was removed. Continue taking this medication, and follow the directions you see here.   methocarbamol 500 MG tablet Commonly known as:  ROBAXIN Take 1 tablet (500 mg total) by mouth every 6 (six) hours as needed for muscle spasms.   omeprazole 40 MG capsule Commonly known as:  PRILOSEC Take 1 capsule (40 mg total) by mouth daily. PATIENT NEEDS OFFICE VISIT FOR ADDITIONAL REFILLS   polyethylene glycol packet Commonly known as:  MIRALAX / GLYCOLAX Take 17 g by mouth 2 (two) times  daily.   SYNTHROID 175 MCG tablet Generic drug:  levothyroxine Take 1 tablet (175 mcg total) by mouth daily. NO MORE REFILLS WITHOUT OFFICE VISIT - 2ND NOTICE        Signed: [place]. Tammy Ericsson   PA-C  11/30/2016, 10:21 AM

## 2017-07-19 NOTE — Progress Notes (Signed)
Please place orders in EPIC as patient is being scheduled for a pre-op appointment! Thank you! 

## 2017-07-19 NOTE — Progress Notes (Signed)
Need orders in epic for 8-14 surgery

## 2017-07-23 NOTE — Patient Instructions (Addendum)
Philip Humphrey  07/23/2017   Your procedure is scheduled on:  08/03/17  Report to [facility] Main  Entrance               Follow signs to Short Stay on first floor at     0515 AM   Call this number if you have problems the morning of surgery  [phone]   Remember: ONLY 1 PERSON MAY GO WITH YOU TO SHORT STAY TO GET  READY MORNING OF [place].  Do not eat food or drink liquids :After Midnight.     Take these medicines the morning of surgery with A SIP OF WATER: synthroid, zoloft if needed, gabapentin                                You may not have any metal on your body including hair pins and              piercings  Do not wear jewelry,, lotions, powders or perfumes, deodorant                   Men may shave face and neck.   Do not bring valuables to the hospital. [facility].  Contacts, dentures or bridgework may not be worn into surgery.  Leave suitcase in the car. After surgery it may be brought to your room.                 Please read over the following fact sheets you were given: _____________________________________________________________________           [facility] - Preparing for Surgery Before surgery, you can play an important role.  Because skin is not sterile, your skin needs to be as free of germs as possible.  You can reduce the number of germs on your skin by washing with CHG (chlorahexidine gluconate) soap before surgery.  CHG is an antiseptic cleaner which kills germs and bonds with the skin to continue killing germs even after washing. Please DO NOT use if you have an allergy to CHG or antibacterial soaps.  If your skin becomes reddened/irritated stop using the CHG and inform your nurse when you arrive at Short Stay. Do not shave (including legs and underarms) for at least 48 hours prior to the first CHG shower.  You may shave your face/neck. Please follow these instructions  carefully:  1.  Shower with CHG Soap the night before surgery and the  morning of Surgery.  2.  If you choose to wash your hair, wash your hair first as usual with your  normal  shampoo.  3.  After you shampoo, rinse your hair and body thoroughly to remove the  shampoo.                           4.  Use CHG as you would any other liquid soap.  You can apply chg directly  to the skin and wash                       Gently with a scrungie or clean washcloth.  5.  Apply the CHG Soap to your body ONLY  FROM THE NECK DOWN.   Do not use on face/ open                           Wound or open sores. Avoid contact with eyes, ears mouth and genitals (private parts).                       Wash face,  Genitals (private parts) with your normal soap.             6.  Wash thoroughly, paying special attention to the area where your surgery  will be performed.  7.  Thoroughly rinse your body with warm water from the neck down.  8.  DO NOT shower/wash with your normal soap after using and rinsing off  the CHG Soap.                9.  Pat yourself dry with a clean towel.            10.  Wear clean pajamas.            11.  Place clean sheets on your bed the night of your first shower and do not  sleep with pets. Day of Surgery : Do not apply any lotions/deodorants the morning of surgery.  Please wear clean clothes to the hospital/surgery center.  FAILURE TO FOLLOW THESE INSTRUCTIONS MAY RESULT IN THE CANCELLATION OF YOUR SURGERY PATIENT SIGNATURE_________________________________  NURSE SIGNATURE__________________________________  ________________________________________________________________________  WHAT IS A BLOOD TRANSFUSION? Blood Transfusion Information  A transfusion is the replacement of blood or some of its parts. Blood is made up of multiple cells which provide different functions.  Red blood cells carry oxygen and are used for blood loss replacement.  White blood cells fight against  infection.  Platelets control bleeding.  Plasma helps clot blood.  Other blood products are available for specialized needs, such as hemophilia or other clotting disorders. BEFORE THE TRANSFUSION  Who gives blood for transfusions?   Healthy volunteers who are fully evaluated to make sure their blood is safe. This is blood bank blood. Transfusion therapy is the safest it has ever been in the practice of medicine. Before blood is taken from a donor, a complete history is taken to make sure that person has no history of diseases nor engages in risky social behavior (examples are intravenous drug use or sexual activity with multiple partners). The donor's travel history is screened to minimize risk of transmitting infections, such as malaria. The donated blood is tested for signs of infectious diseases, such as HIV and hepatitis. The blood is then tested to be sure it is compatible with you in order to minimize the chance of a transfusion reaction. If you or a relative donates blood, this is often done in anticipation of surgery and is not appropriate for emergency situations. It takes many days to process the donated blood. RISKS AND COMPLICATIONS Although transfusion therapy is very safe and saves many lives, the main dangers of transfusion include:   Getting an infectious disease.  Developing a transfusion reaction. This is an allergic reaction to something in the blood you were given. Every precaution is taken to prevent this. The decision to have a blood transfusion has been considered carefully by your caregiver before blood is given. Blood is not given unless the benefits outweigh the risks. AFTER THE TRANSFUSION  Right after receiving a blood transfusion, you will usually feel much better  and more energetic. This is especially true if your red blood cells have gotten low (anemic). The transfusion raises the level of the red blood cells which carry oxygen, and this usually causes an energy  increase.  The nurse administering the transfusion will monitor you carefully for complications. HOME CARE INSTRUCTIONS  No special instructions are needed after a transfusion. You may find your energy is better. Speak with your caregiver about any limitations on activity for underlying diseases you may have. SEEK MEDICAL CARE IF:   Your condition is not improving after your transfusion.  You develop redness or irritation at the intravenous (IV) site. SEEK IMMEDIATE MEDICAL CARE IF:  Any of the following symptoms occur over the next 12 hours:  Shaking chills.  You have a temperature by mouth above 102 F (38.9 C), not controlled by medicine.  Chest, back, or muscle pain.  People around you feel you are not acting correctly or are confused.  Shortness of breath or difficulty breathing.  Dizziness and fainting.  You get a rash or develop hives.  You have a decrease in urine output.  Your urine turns a dark color or changes to pink, red, or brown. Any of the following symptoms occur over the next 10 days:  You have a temperature by mouth above 102 F (38.9 C), not controlled by medicine.  Shortness of breath.  Weakness after normal activity.  The white part of the eye turns yellow (jaundice).  You have a decrease in the amount of urine or are urinating less often.  Your urine turns a dark color or changes to pink, red, or brown. Document Released: 12/04/2000 Document Revised: 02/29/2012 Document Reviewed: 07/23/2008 ExitCare Patient Information 2014 [place].  _______________________________________________________________________  Incentive Spirometer  An incentive spirometer is a tool that can help keep your lungs clear and active. This tool measures how well you are filling your lungs with each breath. Taking long deep breaths may help reverse or decrease the chance of developing breathing (pulmonary) problems (especially infection) following:  A long  period of time when you are unable to move or be active. BEFORE THE PROCEDURE   If the spirometer includes an indicator to show your best effort, your nurse or respiratory therapist will set it to a desired goal.  If possible, sit up straight or lean slightly forward. Try not to slouch.  Hold the incentive spirometer in an upright position. INSTRUCTIONS FOR USE  1. Sit on the edge of your bed if possible, or sit up as far as you can in bed or on a chair. 2. Hold the incentive spirometer in an upright position. 3. Breathe out normally. 4. Place the mouthpiece in your mouth and seal your lips tightly around it. 5. Breathe in slowly and as deeply as possible, raising the piston or the ball toward the top of the column. 6. Hold your breath for 3-5 seconds or for as long as possible. Allow the piston or ball to fall to the bottom of the column. 7. Remove the mouthpiece from your mouth and breathe out normally. 8. Rest for a few seconds and repeat Steps 1 through 7 at least 10 times every 1-2 hours when you are awake. Take your time and take a few normal breaths between deep breaths. 9. The spirometer may include an indicator to show your best effort. Use the indicator as a goal to work toward during each repetition. 10. After each set of 10 deep breaths, practice coughing to be sure your  lungs are clear. If you have an incision (the cut made at the time of surgery), support your incision when coughing by placing a pillow or rolled up towels firmly against it. Once you are able to get out of bed, walk around indoors and cough well. You may stop using the incentive spirometer when instructed by your caregiver.  RISKS AND COMPLICATIONS  Take your time so you do not get dizzy or light-headed.  If you are in pain, you may need to take or ask for pain medication before doing incentive spirometry. It is harder to take a deep breath if you are having pain. AFTER USE  Rest and breathe slowly and  easily.  It can be helpful to keep track of a log of your progress. Your caregiver can provide you with a simple table to help with this. If you are using the spirometer at home, follow these instructions: [facility] IF:   You are having difficultly using the spirometer.  You have trouble using the spirometer as often as instructed.  Your pain medication is not giving enough relief while using the spirometer.  You develop fever of 100.5 F (38.1 C) or higher. SEEK IMMEDIATE MEDICAL CARE IF:   You cough up bloody sputum that had not been present before.  You develop fever of 102 F (38.9 C) or greater.  You develop worsening pain at or near the incision site. MAKE SURE YOU:   Understand these instructions.  Will watch your condition.  Will get help right away if you are not doing well or get worse. Document Released: 04/19/2007 Document Revised: 02/29/2012 Document Reviewed: 06/20/2007 [facility] Patient Information 2014 [place], [place].   ________________________________________________________________________

## 2017-07-23 NOTE — Progress Notes (Signed)
07/21/17 ovn on chart  07/21/17 ekg on chart cmp 07/21/17 chart

## 2017-07-24 NOTE — H&P (Signed)
TOTAL HIP ADMISSION H&P  Patient is admitted for right total hip arthroplasty, anterior approach.  Subjective:  Chief Complaint:    Right hip primary OA / pain  HPI: Philip Humphrey, 56 y.o. male, has a history of pain and functional disability in the right hip(s) due to arthritis and patient has failed non-surgical conservative treatments for greater than 12 weeks to include NSAID's and/or analgesics, corticosteriod injections, use of assistive devices and activity modification.  Onset of symptoms was gradual starting months ago with gradually worsening course since that time.The patient noted prior procedures of the hip to include arthroplasty on the left hip(s).  Patient currently rates pain in the right hip at 9 out of 10 with activity. Patient has night pain, worsening of pain with activity and weight bearing, trendelenberg gait, pain that interfers with activities of daily living and pain with passive range of motion. Patient has evidence of periarticular osteophytes and joint space narrowing by imaging studies. This condition presents safety issues increasing the risk of falls.   There is no current active infection. Risks, benefits and expectations were discussed with the patient.  Risks including but not limited to the risk of anesthesia, blood clots, nerve damage, blood vessel damage, failure of the prosthesis, infection and up to and including death.  Patient understand the risks, benefits and expectations and wishes to proceed with surgery.   PCP: Kristie Cowman, MD  D/C Plans:       Home   Post-op Meds:       No Rx given   Tranexamic Acid:      To be given - IV   Decadron:      Is to be given  FYI:     ASA  Norco  DME:   Pt already has equipment   PT:   No PT    Patient Active Problem List   Diagnosis Date Noted  . Obese 11/25/2016  . 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 Hypothyroidism 04/12/2012  . HTN (hypertension) 04/12/2012  . History of gastritis 04/12/2012  .  History of tobacco use 04/12/2012  . Overweight(278.02) 04/12/2012   Past Medical History:  Diagnosis Date  . Allergy    occ. seasonal  . Anxiety   . Arthritis    Avascular necrosis left  hip, most joints painful  . Colon polyp   . Condyloma    anal  . GERD (gastroesophageal reflux disease)   . History of transfusion    as child  . Hypertension   . Knee injury    right knee cap w piece broken off- MRI done 02-06-15-pending surgery. 11-18-16 right knee is still painful, both shoulders(limited ROM right).  . Knee pain   . 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 Palpitations    normal stress test 10 yrs ago  . Thyroid disease    Thyroid removed unintentionally at age 56- no further thyroid tissue remains-uses daily supplement    Past Surgical History:  Procedure Laterality Date  . COLONOSCOPY    . COLONOSCOPY N/A 02/15/2015   Procedure: COLONOSCOPY;  Surgeon: Alphonsa Overall, MD;  Location: [facility];  Service: General;  Laterality: N/A;  . cyst removed     from neck  . EXAMINATION UNDER ANESTHESIA N/A 02/15/2015   Procedure: EXAM UNDER ANESTHESIA;  Surgeon: Pedro Earls, MD;  Location: [facility];  Service: General;  Laterality: N/A;  . HERNIA REPAIR Left    10 yrs ago  . KNEE ARTHROSCOPY  Left    x1  . ROTATOR CUFF REPAIR Right    x1  . SHOULDER ARTHROSCOPY W/ ROTATOR CUFF REPAIR Left   . THYROIDECTOMY  age 29   . TOTAL HIP ARTHROPLASTY Left 11/24/2016   Procedure: LEFT TOTAL HIP ARTHROPLASTY ANTERIOR APPROACH;  Surgeon: Paralee Cancel, MD;  Location: [facility];  Service: Orthopedics;  Laterality: Left;  . WART FULGURATION N/A 02/15/2015   Procedure: REMOVAL OF ANAL TAGS,CONDYLOMA;  Surgeon: Pedro Earls, MD;  Location: [facility];  Service: General;  Laterality: N/A;  . WRIST SURGERY     x2- left (repair tendon/ ligament)    No prescriptions prior to admission.   Allergies  Allergen Reactions  . Sulfa Antibiotics Anaphylaxis and Swelling    MOUTH AND THROAT  . Oxycodone  Itching    Social History  Substance Use Topics  . Smoking status: Former Smoker    Types: Cigars    Quit date: 08/22/2011  . Smokeless tobacco: Never Used  . Alcohol use Yes     Comment: OCCASIONALLY BEER    Family History  Problem Relation Age of Onset  . Cancer Mother        Lung  . Heart disease Mother   . Cancer Brother        Lung  . Lung disease Brother   . Stroke Father      Review of Systems  Constitutional: Positive for malaise/fatigue.  HENT: Negative.   Eyes: Negative.   Respiratory: Negative.   Cardiovascular: Negative.   Gastrointestinal: Positive for heartburn.  Genitourinary: Negative.   Musculoskeletal: Positive for joint pain.  Skin: Negative.   Neurological: Negative.   Endo/Heme/Allergies: Positive for environmental allergies.  Psychiatric/Behavioral: The patient is nervous/anxious.     Objective:  Physical Exam  Constitutional: He is oriented to person, place, and time. He appears well-developed.  HENT:  Head: Normocephalic.  Eyes: Pupils are equal, round, and reactive to light.  Neck: Neck supple. No JVD present. No tracheal deviation present. No thyromegaly present.  Cardiovascular: Normal rate, regular rhythm and intact distal pulses.   Respiratory: Effort normal and breath sounds normal. No respiratory distress. He has no wheezes.  GI: Soft. There is no tenderness. There is no guarding.  Musculoskeletal:       Right hip: He exhibits decreased range of motion, decreased strength, tenderness and bony tenderness. He exhibits no swelling, no deformity and no laceration.  Lymphadenopathy:    He has no cervical adenopathy.  Neurological: He is alert and oriented to person, place, and time. A sensory deficit (bilateral LE neuropathy) is present.  Skin: Skin is warm and dry.  Psychiatric: He has a normal mood and affect.     Labs:   Estimated body mass index is 34.56 kg/m as calculated from the following:   Height as of 11/24/16: 5\' 9"   (1.753 m).   Weight as of 11/24/16: 106.1 kg (234 lb).   Imaging Review Plain radiographs demonstrate moderate degenerative joint disease of the right hip(s). The bone quality appears to be good for age and reported activity level.  Assessment/Plan:  End stage arthritis, right hip(s)  The patient history, physical examination, clinical judgement of the provider and imaging studies are consistent with end stage degenerative joint disease of the right hip(s) and total hip arthroplasty is deemed medically necessary. The treatment options including medical management, injection therapy, arthroscopy and arthroplasty were discussed at length. The risks and benefits of total hip arthroplasty were presented and reviewed. The risks due  to aseptic loosening, infection, stiffness, dislocation/subluxation,  thromboembolic complications and other imponderables were discussed.  The patient acknowledged the explanation, agreed to proceed with the plan and consent was signed. Patient is being admitted for inpatient treatment for surgery, pain control, PT, OT, prophylactic antibiotics, VTE prophylaxis, progressive ambulation and ADL's and discharge planning.The patient is planning to be discharged home.     West Pugh Zackory Pudlo   PA-C  07/24/2017, 8:40 PM

## 2017-07-26 ENCOUNTER — Encounter (INDEPENDENT_AMBULATORY_CARE_PROVIDER_SITE_OTHER): Payer: Self-pay

## 2017-07-26 ENCOUNTER — Encounter (HOSPITAL_COMMUNITY): Payer: Self-pay

## 2017-07-26 ENCOUNTER — Encounter (HOSPITAL_COMMUNITY)
Admission: RE | Admit: 2017-07-26 | Discharge: 2017-07-26 | Disposition: A | Discharge status: Home / Self Care | Payer: 59 | Source: Ambulatory Visit | Attending: Orthopedic Surgery | Admitting: Orthopedic Surgery

## 2017-07-26 DIAGNOSIS — Z01812 Encounter for preprocedural laboratory examination: Secondary | ICD-10-CM | POA: Insufficient documentation

## 2017-07-26 DIAGNOSIS — Z0183 Encounter for blood typing: Secondary | ICD-10-CM | POA: Insufficient documentation

## 2017-07-26 DIAGNOSIS — M1611 Unilateral primary osteoarthritis, right hip: Secondary | ICD-10-CM | POA: Diagnosis not present

## 2017-07-26 HISTORY — DX: Major depressive disorder, single episode, unspecified: F32.9

## 2017-07-26 HISTORY — DX: Pneumonia, unspecified organism: J18.9

## 2017-07-26 HISTORY — DX: Depression, unspecified: F32.A

## 2017-07-26 LAB — CBC
HEMATOCRIT: 42.3 % (ref 39.0–52.0)
Hemoglobin: 15.1 g/dL (ref 13.0–17.0)
MCH: 34.2 pg — AB (ref 26.0–34.0)
MCHC: 35.7 g/dL (ref 30.0–36.0)
MCV: 95.7 fL (ref 78.0–100.0)
PLATELETS: 174 10*3/uL (ref 150–400)
RBC: 4.42 MIL/uL (ref 4.22–5.81)
RDW: 12.2 % (ref 11.5–15.5)
WBC: 9.5 10*3/uL (ref 4.0–10.5)

## 2017-07-26 LAB — SURGICAL PCR SCREEN
MRSA, PCR: NEGATIVE
Staphylococcus aureus: NEGATIVE

## 2017-08-02 NOTE — Anesthesia Preprocedure Evaluation (Addendum)
Anesthesia Evaluation  Patient identified by MRN, date of birth, ID band Patient awake    Reviewed: Allergy & Precautions, NPO status , Patient's Chart, lab work & pertinent test results  Airway Mallampati: III  TM Distance: >3 FB Neck ROM: Full    Dental no notable dental hx. (+) Caps   Pulmonary former smoker,    Pulmonary exam normal breath sounds clear to auscultation       Cardiovascular hypertension, Pt. on medications Normal cardiovascular exam Rhythm:Regular Rate:Normal  ECG: NSR, rate 86   Neuro/Psych PSYCHIATRIC DISORDERS Anxiety Depression negative neurological ROS     GI/Hepatic Neg liver ROS, GERD  Medicated and Controlled,  Endo/Other  Hypothyroidism   Renal/GU negative Renal ROS     Musculoskeletal  (+) Arthritis , Osteoarthritis,    Abdominal (+) + obese,   Peds  Hematology negative hematology ROS (+)   Anesthesia Other Findings Hyperlipidemia   Reproductive/Obstetrics                            Anesthesia Physical  Anesthesia Plan  ASA: III  Anesthesia Plan: Spinal   Post-op Pain Management:    Induction:   PONV Risk Score and Plan: 1 and Ondansetron, Dexamethasone and Propofol infusion  Airway Management Planned: Natural Airway  Additional Equipment:   Intra-op Plan: Utilization Of Total Body Hypothermia per surgeon request  Post-operative Plan:   Informed Consent: I have reviewed the patients History and Physical, chart, labs and discussed the procedure including the risks, benefits and alternatives for the proposed anesthesia with the patient or authorized representative who has indicated his/her understanding and acceptance.   Dental advisory given  Plan Discussed with: CRNA  Anesthesia Plan Comments:        Anesthesia Quick Evaluation

## 2017-08-03 ENCOUNTER — Encounter (HOSPITAL_COMMUNITY)
Admission: RE | Disposition: A | Discharge status: Home / Self Care | Payer: Self-pay | Source: Ambulatory Visit | Attending: Orthopedic Surgery

## 2017-08-03 ENCOUNTER — Inpatient Hospital Stay (HOSPITAL_COMMUNITY)
Admission: RE | Admit: 2017-08-03 | Discharge: 2017-08-04 | DRG: 470 | Disposition: A | Discharge status: Home / Self Care | Payer: 59 | Source: Ambulatory Visit | Attending: Orthopedic Surgery | Admitting: Orthopedic Surgery

## 2017-08-03 ENCOUNTER — Encounter (HOSPITAL_COMMUNITY): Payer: Self-pay

## 2017-08-03 ENCOUNTER — Inpatient Hospital Stay (HOSPITAL_COMMUNITY): Payer: 59

## 2017-08-03 ENCOUNTER — Inpatient Hospital Stay (HOSPITAL_COMMUNITY): Payer: 59 | Admitting: Certified Registered Nurse Anesthetist

## 2017-08-03 DIAGNOSIS — Z96641 Presence of right artificial hip joint: Secondary | ICD-10-CM

## 2017-08-03 DIAGNOSIS — K219 Gastro-esophageal reflux disease without esophagitis: Secondary | ICD-10-CM | POA: Diagnosis present

## 2017-08-03 DIAGNOSIS — Z8249 Family history of ischemic heart disease and other diseases of the circulatory system: Secondary | ICD-10-CM | POA: Diagnosis not present

## 2017-08-03 DIAGNOSIS — Z885 Allergy status to narcotic agent status: Secondary | ICD-10-CM

## 2017-08-03 DIAGNOSIS — Z86010 Personal history of colonic polyps: Secondary | ICD-10-CM

## 2017-08-03 DIAGNOSIS — Z882 Allergy status to sulfonamides status: Secondary | ICD-10-CM

## 2017-08-03 DIAGNOSIS — I10 Essential (primary) hypertension: Secondary | ICD-10-CM | POA: Diagnosis present

## 2017-08-03 DIAGNOSIS — Z823 Family history of stroke: Secondary | ICD-10-CM

## 2017-08-03 DIAGNOSIS — F418 Other specified anxiety disorders: Secondary | ICD-10-CM | POA: Diagnosis present

## 2017-08-03 DIAGNOSIS — Z87891 Personal history of nicotine dependence: Secondary | ICD-10-CM | POA: Diagnosis not present

## 2017-08-03 DIAGNOSIS — M1611 Unilateral primary osteoarthritis, right hip: Principal | ICD-10-CM | POA: Diagnosis present

## 2017-08-03 DIAGNOSIS — Z6834 Body mass index (BMI) 34.0-34.9, adult: Secondary | ICD-10-CM

## 2017-08-03 DIAGNOSIS — E039 Hypothyroidism, unspecified: Secondary | ICD-10-CM | POA: Diagnosis present

## 2017-08-03 DIAGNOSIS — Z9889 Other specified postprocedural states: Secondary | ICD-10-CM

## 2017-08-03 DIAGNOSIS — F419 Anxiety disorder, unspecified: Secondary | ICD-10-CM | POA: Diagnosis present

## 2017-08-03 DIAGNOSIS — Z801 Family history of malignant neoplasm of trachea, bronchus and lung: Secondary | ICD-10-CM | POA: Diagnosis not present

## 2017-08-03 DIAGNOSIS — E669 Obesity, unspecified: Secondary | ICD-10-CM | POA: Diagnosis present

## 2017-08-03 DIAGNOSIS — Z96642 Presence of left artificial hip joint: Secondary | ICD-10-CM | POA: Diagnosis present

## 2017-08-03 HISTORY — PX: TOTAL HIP ARTHROPLASTY: SHX124

## 2017-08-03 LAB — TYPE AND SCREEN
ABO/RH(D): A POS
Antibody Screen: NEGATIVE

## 2017-08-03 SURGERY — ARTHROPLASTY, HIP, TOTAL, ANTERIOR APPROACH
Anesthesia: Spinal | Site: Hip | Laterality: Right

## 2017-08-03 MED ORDER — ONDANSETRON HCL 4 MG/2ML IJ SOLN
INTRAMUSCULAR | Status: DC | PRN
  Administered 2017-08-03: 4 mg via INTRAVENOUS

## 2017-08-03 MED ORDER — DIPHENHYDRAMINE-APAP (SLEEP) 25-500 MG PO TABS: 2.0000 | ORAL_TABLET | Freq: Every evening | ORAL | Status: DC | PRN

## 2017-08-03 MED ORDER — ASPIRIN 81 MG PO CHEW
81.0000 mg | CHEWABLE_TABLET | Freq: Two times a day (BID) | ORAL | Status: DC
  Administered 2017-08-03 – 2017-08-04 (×2): 81 mg via ORAL
  Filled 2017-08-03 (×2): qty 1

## 2017-08-03 MED ORDER — LIDOCAINE 2% (20 MG/ML) 5 ML SYRINGE
INTRAMUSCULAR | Status: AC
  Filled 2017-08-03: qty 5

## 2017-08-03 MED ORDER — CEFAZOLIN SODIUM-DEXTROSE 1-4 GM/50ML-% IV SOLN
1.0000 g | Freq: Four times a day (QID) | INTRAVENOUS | Status: AC
  Administered 2017-08-03 (×2): 1 g via INTRAVENOUS
  Filled 2017-08-03 (×3): qty 50

## 2017-08-03 MED ORDER — SODIUM CHLORIDE 0.9 % IR SOLN
Status: DC | PRN
  Administered 2017-08-03: 1000 mL

## 2017-08-03 MED ORDER — FENTANYL CITRATE (PF) 100 MCG/2ML IJ SOLN: 25.0000 ug | INTRAMUSCULAR | Status: DC | PRN

## 2017-08-03 MED ORDER — DEXAMETHASONE SODIUM PHOSPHATE 10 MG/ML IJ SOLN
INTRAMUSCULAR | Status: AC
  Filled 2017-08-03: qty 1

## 2017-08-03 MED ORDER — HYDROMORPHONE HCL-NACL 0.5-0.9 MG/ML-% IV SOSY
0.5000 mg | PREFILLED_SYRINGE | INTRAVENOUS | Status: DC | PRN
  Administered 2017-08-03 (×2): 1 mg via INTRAVENOUS
  Filled 2017-08-03 (×2): qty 2

## 2017-08-03 MED ORDER — TRANEXAMIC ACID 1000 MG/10ML IV SOLN
1000.0000 mg | INTRAVENOUS | Status: AC
  Administered 2017-08-03: 1000 mg via INTRAVENOUS
  Filled 2017-08-03 (×2): qty 10

## 2017-08-03 MED ORDER — FENTANYL CITRATE (PF) 100 MCG/2ML IJ SOLN
INTRAMUSCULAR | Status: AC
  Filled 2017-08-03: qty 2

## 2017-08-03 MED ORDER — ONDANSETRON HCL 4 MG PO TABS: 4.0000 mg | ORAL_TABLET | Freq: Four times a day (QID) | ORAL | Status: DC | PRN

## 2017-08-03 MED ORDER — PHENYLEPHRINE HCL 10 MG/ML IJ SOLN
INTRAMUSCULAR | Status: AC
  Filled 2017-08-03: qty 1

## 2017-08-03 MED ORDER — DEXAMETHASONE SODIUM PHOSPHATE 10 MG/ML IJ SOLN
10.0000 mg | Freq: Once | INTRAMUSCULAR | Status: AC
  Administered 2017-08-04: 10 mg via INTRAVENOUS
  Filled 2017-08-03: qty 1

## 2017-08-03 MED ORDER — METHOCARBAMOL 1000 MG/10ML IJ SOLN
500.0000 mg | Freq: Four times a day (QID) | INTRAVENOUS | Status: DC | PRN
  Administered 2017-08-03: 500 mg via INTRAVENOUS
  Filled 2017-08-03: qty 550

## 2017-08-03 MED ORDER — PHENYLEPHRINE HCL 10 MG/ML IJ SOLN
INTRAMUSCULAR | Status: DC | PRN
  Administered 2017-08-03 (×3): 80 ug via INTRAVENOUS

## 2017-08-03 MED ORDER — SERTRALINE HCL 25 MG PO TABS: 25.0000 mg | ORAL_TABLET | Freq: Every day | ORAL | Status: DC | PRN

## 2017-08-03 MED ORDER — HYDROCODONE-ACETAMINOPHEN 7.5-325 MG PO TABS
1.0000 | ORAL_TABLET | ORAL | Status: DC | PRN
  Administered 2017-08-03 – 2017-08-04 (×6): 2 via ORAL
  Filled 2017-08-03 (×6): qty 2

## 2017-08-03 MED ORDER — LIDOCAINE HCL (CARDIAC) 20 MG/ML IV SOLN
INTRAVENOUS | Status: DC | PRN
  Administered 2017-08-03: 50 mg via INTRAVENOUS

## 2017-08-03 MED ORDER — SODIUM CHLORIDE 0.9 % IV SOLN
INTRAVENOUS | Status: DC
  Administered 2017-08-03: 12:00:00 via INTRAVENOUS

## 2017-08-03 MED ORDER — METOCLOPRAMIDE HCL 5 MG PO TABS: 5.0000 mg | ORAL_TABLET | Freq: Three times a day (TID) | ORAL | Status: DC | PRN

## 2017-08-03 MED ORDER — CEFAZOLIN SODIUM-DEXTROSE 2-4 GM/100ML-% IV SOLN
INTRAVENOUS | Status: AC
  Filled 2017-08-03: qty 100

## 2017-08-03 MED ORDER — PROPOFOL 10 MG/ML IV BOLUS
INTRAVENOUS | Status: AC
  Filled 2017-08-03: qty 20

## 2017-08-03 MED ORDER — PANTOPRAZOLE SODIUM 40 MG PO TBEC
80.0000 mg | DELAYED_RELEASE_TABLET | Freq: Every day | ORAL | Status: DC
  Administered 2017-08-04: 80 mg via ORAL
  Filled 2017-08-03: qty 2

## 2017-08-03 MED ORDER — LEVOTHYROXINE SODIUM 50 MCG PO TABS
175.0000 ug | ORAL_TABLET | Freq: Every day | ORAL | Status: DC
  Administered 2017-08-04: 175 ug via ORAL
  Filled 2017-08-03: qty 1

## 2017-08-03 MED ORDER — FENTANYL CITRATE (PF) 100 MCG/2ML IJ SOLN
INTRAMUSCULAR | Status: DC | PRN
  Administered 2017-08-03 (×2): 50 ug via INTRAVENOUS

## 2017-08-03 MED ORDER — PHENYLEPHRINE 40 MCG/ML (10ML) SYRINGE FOR IV PUSH (FOR BLOOD PRESSURE SUPPORT)
PREFILLED_SYRINGE | INTRAVENOUS | Status: AC
  Filled 2017-08-03: qty 10

## 2017-08-03 MED ORDER — METHOCARBAMOL 500 MG PO TABS
500.0000 mg | ORAL_TABLET | Freq: Four times a day (QID) | ORAL | Status: DC | PRN
  Administered 2017-08-03 – 2017-08-04 (×2): 500 mg via ORAL
  Filled 2017-08-03 (×2): qty 1

## 2017-08-03 MED ORDER — CEFAZOLIN SODIUM-DEXTROSE 2-4 GM/100ML-% IV SOLN
2.0000 g | INTRAVENOUS | Status: AC
  Administered 2017-08-03: 2 g via INTRAVENOUS

## 2017-08-03 MED ORDER — BUPIVACAINE HCL (PF) 0.75 % IJ SOLN
INTRAMUSCULAR | Status: DC | PRN
  Administered 2017-08-03: 2 mL via INTRATHECAL

## 2017-08-03 MED ORDER — ALUM & MAG HYDROXIDE-SIMETH 200-200-20 MG/5ML PO SUSP: 30.0000 mL | ORAL | Status: DC | PRN

## 2017-08-03 MED ORDER — METOCLOPRAMIDE HCL 5 MG/ML IJ SOLN: 5.0000 mg | Freq: Three times a day (TID) | INTRAMUSCULAR | Status: DC | PRN

## 2017-08-03 MED ORDER — MIDAZOLAM HCL 5 MG/5ML IJ SOLN
INTRAMUSCULAR | Status: DC | PRN
  Administered 2017-08-03 (×2): 1 mg via INTRAVENOUS

## 2017-08-03 MED ORDER — MENTHOL 3 MG MT LOZG: 1.0000 | LOZENGE | OROMUCOSAL | Status: DC | PRN

## 2017-08-03 MED ORDER — DIPHENHYDRAMINE HCL 25 MG PO CAPS: 50.0000 mg | ORAL_CAPSULE | Freq: Every evening | ORAL | Status: DC | PRN

## 2017-08-03 MED ORDER — PROPOFOL 500 MG/50ML IV EMUL
INTRAVENOUS | Status: DC | PRN
  Administered 2017-08-03: 100 ug/kg/min via INTRAVENOUS

## 2017-08-03 MED ORDER — ACETAMINOPHEN 500 MG PO TABS: 1000.0000 mg | ORAL_TABLET | Freq: Every evening | ORAL | Status: DC | PRN

## 2017-08-03 MED ORDER — DOCUSATE SODIUM 100 MG PO CAPS
100.0000 mg | ORAL_CAPSULE | Freq: Two times a day (BID) | ORAL | Status: DC
  Administered 2017-08-03 – 2017-08-04 (×2): 100 mg via ORAL
  Filled 2017-08-03 (×2): qty 1

## 2017-08-03 MED ORDER — GABAPENTIN 300 MG PO CAPS
300.0000 mg | ORAL_CAPSULE | Freq: Three times a day (TID) | ORAL | Status: DC
  Administered 2017-08-03 – 2017-08-04 (×3): 300 mg via ORAL
  Filled 2017-08-03 (×3): qty 1

## 2017-08-03 MED ORDER — ONDANSETRON HCL 4 MG/2ML IJ SOLN: 4.0000 mg | Freq: Four times a day (QID) | INTRAMUSCULAR | Status: DC | PRN

## 2017-08-03 MED ORDER — ONDANSETRON HCL 4 MG/2ML IJ SOLN
INTRAMUSCULAR | Status: AC
  Filled 2017-08-03: qty 2

## 2017-08-03 MED ORDER — CALCIUM CARBONATE ANTACID 500 MG PO CHEW: 2.0000 | CHEWABLE_TABLET | Freq: Every day | ORAL | Status: DC | PRN

## 2017-08-03 MED ORDER — PROPOFOL 10 MG/ML IV BOLUS
INTRAVENOUS | Status: AC
  Filled 2017-08-03: qty 40

## 2017-08-03 MED ORDER — SODIUM CHLORIDE 0.9 % IV SOLN
1000.0000 mg | Freq: Once | INTRAVENOUS | Status: AC
  Administered 2017-08-03: 1000 mg via INTRAVENOUS
  Filled 2017-08-03: qty 10
  Filled 2017-08-03: qty 1100

## 2017-08-03 MED ORDER — DEXAMETHASONE SODIUM PHOSPHATE 10 MG/ML IJ SOLN
10.0000 mg | Freq: Once | INTRAMUSCULAR | Status: AC
  Administered 2017-08-03: 10 mg via INTRAVENOUS

## 2017-08-03 MED ORDER — ATORVASTATIN CALCIUM 40 MG PO TABS
80.0000 mg | ORAL_TABLET | Freq: Every day | ORAL | Status: DC
  Administered 2017-08-04: 80 mg via ORAL
  Filled 2017-08-03: qty 2

## 2017-08-03 MED ORDER — PHENYLEPHRINE HCL 10 MG/ML IJ SOLN
INTRAVENOUS | Status: DC | PRN
  Administered 2017-08-03: 50 ug/min via INTRAVENOUS

## 2017-08-03 MED ORDER — FERROUS SULFATE 325 (65 FE) MG PO TABS
325.0000 mg | ORAL_TABLET | Freq: Two times a day (BID) | ORAL | Status: DC
  Administered 2017-08-04: 325 mg via ORAL
  Filled 2017-08-03: qty 1

## 2017-08-03 MED ORDER — STERILE WATER FOR IRRIGATION IR SOLN
Status: DC | PRN
  Administered 2017-08-03: 2000 mL

## 2017-08-03 MED ORDER — PHENOL 1.4 % MT LIQD
1.0000 | OROMUCOSAL | Status: DC | PRN
  Filled 2017-08-03: qty 177

## 2017-08-03 MED ORDER — ACETAMINOPHEN 650 MG RE SUPP: 650.0000 mg | Freq: Four times a day (QID) | RECTAL | Status: DC | PRN

## 2017-08-03 MED ORDER — ACETAMINOPHEN 325 MG PO TABS: 650.0000 mg | ORAL_TABLET | Freq: Four times a day (QID) | ORAL | Status: DC | PRN

## 2017-08-03 MED ORDER — CHLORHEXIDINE GLUCONATE 4 % EX LIQD: 60.0000 mL | Freq: Once | CUTANEOUS | Status: DC

## 2017-08-03 MED ORDER — LACTATED RINGERS IV SOLN
INTRAVENOUS | Status: DC
  Administered 2017-08-03: 1000 mL via INTRAVENOUS
  Administered 2017-08-03 (×2): via INTRAVENOUS

## 2017-08-03 MED ORDER — MIDAZOLAM HCL 2 MG/2ML IJ SOLN
INTRAMUSCULAR | Status: AC
  Filled 2017-08-03: qty 2

## 2017-08-03 MED ORDER — POLYETHYLENE GLYCOL 3350 17 G PO PACK: 17.0000 g | PACK | Freq: Every day | ORAL | Status: DC | PRN

## 2017-08-03 MED ORDER — ONDANSETRON HCL 4 MG/2ML IJ SOLN: 4.0000 mg | Freq: Once | INTRAMUSCULAR | Status: DC | PRN

## 2017-08-03 SURGICAL SUPPLY — 40 items
BAG ZIPLOCK 12X15 (MISCELLANEOUS) ×2 IMPLANT
BLADE SAG 18X100X1.27 (BLADE) ×2 IMPLANT
CAPT HIP TOTAL 2 ×2 IMPLANT
CLOTH BEACON ORANGE TIMEOUT ST (SAFETY) ×2 IMPLANT
COVER PERINEAL POST (MISCELLANEOUS) ×2 IMPLANT
COVER SURGICAL LIGHT HANDLE (MISCELLANEOUS) ×2 IMPLANT
DERMABOND ADVANCED (GAUZE/BANDAGES/DRESSINGS) ×1
DERMABOND ADVANCED .7 DNX12 (GAUZE/BANDAGES/DRESSINGS) ×1 IMPLANT
DRAPE STERI IOBAN 125X83 (DRAPES) ×2 IMPLANT
DRAPE U-SHAPE 47X51 STRL (DRAPES) ×4 IMPLANT
DRESSING AQUACEL AG SP 3.5X10 (GAUZE/BANDAGES/DRESSINGS) ×1 IMPLANT
DRSG AQUACEL AG SP 3.5X10 (GAUZE/BANDAGES/DRESSINGS) ×2
DURAPREP 26ML APPLICATOR (WOUND CARE) ×2 IMPLANT
ELECT REM PT RETURN 15FT ADLT (MISCELLANEOUS) ×2 IMPLANT
GLOVE BIO SURGEON STRL SZ 6.5 (GLOVE) ×2 IMPLANT
GLOVE BIOGEL M STRL SZ7.5 (GLOVE) ×2 IMPLANT
GLOVE BIOGEL PI IND STRL 6.5 (GLOVE) ×1 IMPLANT
GLOVE BIOGEL PI IND STRL 7.5 (GLOVE) ×2 IMPLANT
GLOVE BIOGEL PI IND STRL 8 (GLOVE) ×1 IMPLANT
GLOVE BIOGEL PI IND STRL 8.5 (GLOVE) ×1 IMPLANT
GLOVE BIOGEL PI INDICATOR 6.5 (GLOVE) ×1
GLOVE BIOGEL PI INDICATOR 7.5 (GLOVE) ×2
GLOVE BIOGEL PI INDICATOR 8 (GLOVE) ×1
GLOVE BIOGEL PI INDICATOR 8.5 (GLOVE) ×1
GLOVE ECLIPSE 8.0 STRL XLNG CF (GLOVE) ×4 IMPLANT
GLOVE ORTHO TXT STRL SZ7.5 (GLOVE) ×2 IMPLANT
GLOVE SURG SS PI 7.5 STRL IVOR (GLOVE) ×2 IMPLANT
GOWN STRL REUS W/ TWL XL LVL3 (GOWN DISPOSABLE) ×1 IMPLANT
GOWN STRL REUS W/TWL LRG LVL3 (GOWN DISPOSABLE) ×4 IMPLANT
GOWN STRL REUS W/TWL XL LVL3 (GOWN DISPOSABLE) ×3 IMPLANT
HOLDER FOLEY CATH W/STRAP (MISCELLANEOUS) ×2 IMPLANT
PACK ANTERIOR HIP CUSTOM (KITS) ×2 IMPLANT
SUT MNCRL AB 4-0 PS2 18 (SUTURE) ×2 IMPLANT
SUT STRATAFIX 0 PDS 27 VIOLET (SUTURE) ×2
SUT VIC AB 1 CT1 36 (SUTURE) ×6 IMPLANT
SUT VIC AB 2-0 CT1 27 (SUTURE) ×3
SUT VIC AB 2-0 CT1 TAPERPNT 27 (SUTURE) ×3 IMPLANT
SUTURE STRATFX 0 PDS 27 VIOLET (SUTURE) ×1 IMPLANT
TRAY FOLEY W/METER SILVER 16FR (SET/KITS/TRAYS/PACK) ×2 IMPLANT
YANKAUER SUCT BULB TIP 10FT TU (MISCELLANEOUS) ×2 IMPLANT

## 2017-08-03 NOTE — Anesthesia Procedure Notes (Signed)
Spinal  Patient location during procedure: OR Start time: 08/03/2017 7:16 AM End time: 08/03/2017 7:19 AM Staffing Anesthesiologist: Adele Barthel P Resident/CRNA: Claudia Desanctis Performed: resident/CRNA  Preanesthetic Checklist Completed: patient identified, site marked, surgical consent, pre-op evaluation, timeout performed, IV checked, risks and benefits discussed and monitors and equipment checked Spinal Block Patient position: sitting Prep: DuraPrep Patient monitoring: heart rate, continuous pulse ox and blood pressure Approach: midline Location: L3-4 Injection technique: single-shot Needle Needle type: Pencan  Needle gauge: 24 G Needle length: 9 cm Needle insertion depth: 8 cm Assessment Sensory level: T6

## 2017-08-03 NOTE — Transfer of Care (Signed)
Immediate Anesthesia Transfer of Care Note  Patient: Philip Humphrey  Procedure(s) Performed: Procedure(s) with comments: RIGHT TOTAL HIP ARTHROPLASTY ANTERIOR APPROACH (Right) - 70 mins  Patient Location: PACU  Anesthesia Type:Spinal  Level of Consciousness:  sedated, patient cooperative and responds to stimulation  Airway & Oxygen Therapy:Patient Spontanous Breathing and Patient connected to face mask oxgen  Post-op Assessment:  Report given to PACU RN and Post -op Vital signs reviewed and stable  Post vital signs:  Reviewed and stable  Last Vitals:  Vitals:   08/03/17 0513  BP: (!) 130/91  Pulse: 88  Resp: 18  Temp: 36.7 C  SpO2: 53%    Complications: No apparent anesthesia complications

## 2017-08-03 NOTE — Anesthesia Postprocedure Evaluation (Signed)
Anesthesia Post Note  Patient: Philip Humphrey  Procedure(s) Performed: Procedure(s) (LRB): RIGHT TOTAL HIP ARTHROPLASTY ANTERIOR APPROACH (Right)     Patient location during evaluation: PACU Anesthesia Type: Spinal Level of consciousness: oriented and awake and alert Pain management: pain level controlled Vital Signs Assessment: post-procedure vital signs reviewed and stable Respiratory status: spontaneous breathing, respiratory function stable and patient connected to nasal cannula oxygen Cardiovascular status: blood pressure returned to baseline and stable Postop Assessment: no headache and no backache Anesthetic complications: no    Last Vitals:  Vitals:   08/03/17 1248 08/03/17 1348  BP: (!) 143/84 (!) 141/79  Pulse: (!) 109 (!) 105  Resp: 16 16  Temp: 36.7 C 36.7 C  SpO2: 100% 100%    Last Pain:  Vitals:   08/03/17 1348  TempSrc: Axillary  PainSc:                  Karyl Kinnier Ellender

## 2017-08-03 NOTE — Evaluation (Signed)
Physical Therapy Evaluation Patient Details Name: Philip Humphrey MRN: [id number] DOB: 1961-11-18 Today's Date: 08/03/2017   History of Present Illness  s/p R DA THA; PMHx: L THA 2017  Clinical Impression  Pt is s/p THA resulting in the deficits listed below (see PT Problem List). * Pt will benefit from skilled PT to increase their independence and safety with mobility to allow discharge to the venue listed below.      Follow Up Recommendations DC plan and follow up therapy as arranged by surgeon    Equipment Recommendations  None recommended by PT    Recommendations for Other Services       Precautions / Restrictions Precautions Precautions: Fall Restrictions Weight Bearing Restrictions: No      Mobility  Bed Mobility Overal bed mobility: Needs Assistance Bed Mobility: Supine to Sit     Supine to sit: Min assist     General bed mobility comments: assist with RLE  Transfers Overall transfer level: Needs assistance Equipment used: Rolling walker (2 wheeled) Transfers: Sit to/from Stand Sit to Stand: Min assist;Min guard         General transfer comment: cues for hand placement and RLE position  Ambulation/Gait Ambulation/Gait assistance: Min guard;Supervision Ambulation Distance (Feet): 140 Feet Assistive device: Rolling walker (2 wheeled) Gait Pattern/deviations: Step-to pattern;Step-through pattern;Decreased stride length;Antalgic     General Gait Details: cues for sequence  Stairs            Wheelchair Mobility    Modified Rankin (Stroke Patients Only)       Balance                                             Pertinent Vitals/Pain Pain Assessment: 0-10 Pain Location: right hip Pain Descriptors / Indicators: Sore Pain Intervention(s): Limited activity within patient's tolerance;Monitored during session;Premedicated before session    Home Living Family/patient expects to be discharged to:: Private  residence Living Arrangements: Spouse/significant other Available Help at Discharge: Family Type of Home: House Home Access: Stairs to enter Entrance Stairs-Rails: Right Entrance Stairs-Number of Steps: 4 Home Layout: One level Home Equipment: Environmental consultant - 2 wheels;Cane - single point;Bedside commode;Tub bench Additional Comments: wife able to assist iwth ADLs prn    Prior Function Level of Independence: Independent               Hand Dominance        Extremity/Trunk Assessment   Upper Extremity Assessment Upper Extremity Assessment: Overall WFL for tasks assessed    Lower Extremity Assessment Lower Extremity Assessment: RLE deficits/detail RLE Deficits / Details: ankle WFL, knee and hip 2+/5, limited by post op pain and weakness       Communication   Communication: No difficulties  Cognition Arousal/Alertness: Awake/alert Behavior During Therapy: WFL for tasks assessed/performed Overall Cognitive Status: Within Functional Limits for tasks assessed                                        General Comments      Exercises Total Joint Exercises Ankle Circles/Pumps: AROM;Both;10 reps   Assessment/Plan    PT Assessment Patient needs continued PT services  PT Problem List Decreased strength;Decreased mobility;Pain       PT Treatment Interventions DME instruction;Gait training;Functional mobility training;Therapeutic activities;Therapeutic exercise;Patient/family education;Stair training  PT Goals (Current goals can be found in the Care Plan section)  Acute Rehab PT Goals Patient Stated Goal: back to independence and have less pain PT Goal Formulation: With patient Time For Goal Achievement: 08/06/17 Potential to Achieve Goals: Good    Frequency 7X/week   Barriers to discharge        Co-evaluation               AM-PAC PT "6 Clicks" Daily Activity  Outcome Measure Difficulty turning over in bed (including adjusting bedclothes,  sheets and blankets)?: Total Difficulty moving from lying on back to sitting on the side of the bed? : Total Difficulty sitting down on and standing up from a chair with arms (e.g., wheelchair, bedside commode, etc,.)?: A Little Help needed moving to and from a bed to chair (including a wheelchair)?: A Little Help needed walking in hospital room?: A Little Help needed climbing 3-5 steps with a railing? : A Little 6 Click Score: 14    End of Session Equipment Utilized During Treatment: Gait belt Activity Tolerance: Patient tolerated treatment well Patient left: in bed;with bed alarm set;with call bell/phone within reach   PT Visit Diagnosis: Difficulty in walking, not elsewhere classified (R26.2);Pain Pain - Right/Left: Right Pain - part of body: Hip    Time: 1400-1419 PT Time Calculation (min) (ACUTE ONLY): 19 min   Charges:   PT Evaluation $PT Eval Low Complexity: 1 Low     PT G Codes:          Citlally Captain 08-08-17, 6:43 PM

## 2017-08-03 NOTE — Anesthesia Procedure Notes (Signed)
Date/Time: 08/03/2017 7:22 AM Performed by: Claudia Desanctis Oxygen Delivery Method: Simple face mask

## 2017-08-03 NOTE — Op Note (Signed)
NAME:  Philip Humphrey                ACCOUNT NO.: [id number]      MEDICAL RECORD NO.: [id number]      FACILITY:  [facility]      PHYSICIAN:  Paralee Cancel D  DATE OF BIRTH:  Oct 02, 1961     DATE OF PROCEDURE:  08/03/2017                                 OPERATIVE REPORT         PREOPERATIVE DIAGNOSIS: Right  hip osteoarthritis.      POSTOPERATIVE DIAGNOSIS:  Right hip osteoarthritis.      PROCEDURE:  Right total hip replacement through an anterior approach   utilizing DePuy THR system, component size 10mm pinnacle cup, a size 36+4 neutral   Altrex liner, a size 7Hi Tri Lock stem with a 36+5 delta ceramic   ball.      SURGEON:  Pietro Cassis. Alvan Dame, M.D.      ASSISTANT:  Molli Barrows, PA-C     ANESTHESIA:  Spinal.      SPECIMENS:  None.      COMPLICATIONS:  None.      BLOOD LOSS:  200 cc     DRAINS:  None.      INDICATION OF THE PROCEDURE:  CUTBERTO WINFREE is a 56 y.o. male who had   presented to office for evaluation of right hip pain.  Radiographs revealed   progressive degenerative changes with bone-on-bone   articulation to the  hip joint.  The patient had painful limited range of   motion significantly affecting their overall quality of life.  The patient was failing to    respond to conservative measures, and at this point was ready   to proceed with more definitive measures.  The patient has noted progressive   degenerative changes in his hip, progressive problems and dysfunction   with regarding the hip prior to surgery.  Consent was obtained for   benefit of pain relief.  Specific risk of infection, DVT, component   failure, dislocation, need for revision surgery, as well discussion of   the anterior versus posterior approach were reviewed.  Consent was   obtained for benefit of anterior pain relief through an anterior   approach.      PROCEDURE IN DETAIL:  The patient was brought to operative theater.   Once adequate anesthesia,  preoperative antibiotics, 2 gm of Ancef, 1 gm of Tranexamic Acid, and 10 mg of Decadron administered.   The patient was positioned supine on the OSI Hanna table.  Once adequate   padding of boney process was carried out, we had predraped out the hip, and  used fluoroscopy to confirm orientation of the pelvis and position.      The right hip was then prepped and draped from proximal iliac crest to   mid thigh with shower curtain technique.      Time-out was performed identifying the patient, planned procedure, and   extremity.     An incision was then made 2 cm distal and lateral to the   anterior superior iliac spine extending over the orientation of the   tensor fascia lata muscle and sharp dissection was carried down to the   fascia of the muscle and protractor placed in the soft tissues.      The fascia was  then incised.  The muscle belly was identified and swept   laterally and retractor placed along the superior neck.  Following   cauterization of the circumflex vessels and removing some pericapsular   fat, a second cobra retractor was placed on the inferior neck.  A third   retractor was placed on the anterior acetabulum after elevating the   anterior rectus.  A L-capsulotomy was along the line of the   superior neck to the trochanteric fossa, then extended proximally and   distally.  Tag sutures were placed and the retractors were then placed   intracapsular.  We then identified the trochanteric fossa and   orientation of my neck cut, confirmed this radiographically   and then made a neck osteotomy with the femur on traction.  The femoral   head was removed without difficulty or complication.  Traction was let   off and retractors were placed posterior and anterior around the   acetabulum.      The labrum and foveal tissue were debrided.  I began reaming with a 86mm   reamer and reamed up to 74mm reamer with good bony bed preparation and a 22mm   cup was chosen.  The final  6mm Pinnacle cup was then impacted under fluoroscopy  to confirm the depth of penetration and orientation with respect to   abduction.  A screw was placed followed by the hole eliminator.  The final   36+4 neutral Altrex liner was impacted with good visualized rim fit.  The cup was positioned anatomically within the acetabular portion of the pelvis.      At this point, the femur was rolled at 80 degrees.  Further capsule was   released off the inferior aspect of the femoral neck.  I then   released the superior capsule proximally.  The hook was placed laterally   along the femur and elevated manually and held in position with the bed   hook.  The leg was then extended and adducted with the leg rolled to 100   degrees of external rotation.  Once the proximal femur was fully   exposed, I used a box osteotome to set orientation.  I then began   broaching with the starting chili pepper broach and passed this by hand and then broached up to 7.  With the 7 broach in place I chose a high offset neck and did several trial reductions.  The offset was appropriate, leg lengths   appeared to be equal best matched to the other side with the +5 head ball confirmed radiographically.   Given these findings, I went ahead and dislocated the hip, repositioned all   retractors and positioned the right hip in the extended and abducted position.  The final 7 Hi Tri Lock stem was   chosen and it was impacted down to the level of neck cut.  Based on this   and the trial reduction, a 36+5 delta ceramic ball was chosen and   impacted onto a clean and dry trunnion, and the hip was reduced.  The   hip had been irrigated throughout the case again at this point.  I did   reapproximate the superior capsular leaflet to the anterior leaflet   using #1 Vicryl.  The fascia of the   tensor fascia lata muscle was then reapproximated using #1 Vicryl and #0 Stratafix sutures.  The   remaining wound was closed with 2-0 Vicryl and  running 4-0 Monocryl.   The hip  was cleaned, dried, and dressed sterilely using Dermabond and   Aquacel dressing.  He was then brought   to recovery room in stable condition tolerating the procedure well.    Molli Barrows, PA-C was present for the entirety of the case involved from   preoperative positioning, perioperative retractor management, general   facilitation of the case, as well as primary wound closure as assistant.            Pietro Cassis Alvan Dame, M.D.        08/03/2017 8:34 AM

## 2017-08-04 ENCOUNTER — Encounter (HOSPITAL_COMMUNITY): Payer: Self-pay | Admitting: *Deleted

## 2017-08-04 LAB — CBC
HCT: 36.6 % — ABNORMAL LOW (ref 39.0–52.0)
Hemoglobin: 12.8 g/dL — ABNORMAL LOW (ref 13.0–17.0)
MCH: 33.2 pg (ref 26.0–34.0)
MCHC: 35 g/dL (ref 30.0–36.0)
MCV: 94.8 fL (ref 78.0–100.0)
PLATELETS: 173 10*3/uL (ref 150–400)
RBC: 3.86 MIL/uL — ABNORMAL LOW (ref 4.22–5.81)
RDW: 12 % (ref 11.5–15.5)
WBC: 15.3 10*3/uL — ABNORMAL HIGH (ref 4.0–10.5)

## 2017-08-04 LAB — BASIC METABOLIC PANEL
Anion gap: 5 (ref 5–15)
BUN: 10 mg/dL (ref 6–20)
CALCIUM: 8.5 mg/dL — AB (ref 8.9–10.3)
CO2: 29 mmol/L (ref 22–32)
CREATININE: 0.93 mg/dL (ref 0.61–1.24)
Chloride: 104 mmol/L (ref 101–111)
GFR calc Af Amer: >60 mL/min (ref >60)
GLUCOSE: 129 mg/dL — AB (ref 65–99)
Potassium: 4.2 mmol/L (ref 3.5–5.1)
SODIUM: 138 mmol/L (ref 135–145)

## 2017-08-04 MED ORDER — METHOCARBAMOL 500 MG PO TABS: 500.0000 mg | ORAL_TABLET | Freq: Four times a day (QID) | ORAL | 0 refills | Status: DC | PRN

## 2017-08-04 MED ORDER — FERROUS SULFATE 325 (65 FE) MG PO TABS: 325.0000 mg | ORAL_TABLET | Freq: Two times a day (BID) | ORAL | 0 refills | Status: DC

## 2017-08-04 MED ORDER — ASPIRIN 81 MG PO CHEW: 81.0000 mg | CHEWABLE_TABLET | Freq: Two times a day (BID) | ORAL | 0 refills | Status: DC

## 2017-08-04 MED ORDER — HYDROCODONE-ACETAMINOPHEN 7.5-325 MG PO TABS: 1.0000 | ORAL_TABLET | ORAL | 0 refills | Status: DC | PRN

## 2017-08-04 NOTE — Discharge Instructions (Signed)

## 2017-08-04 NOTE — Progress Notes (Signed)
   Subjective: 1 Day Post-Op Procedure(s) (LRB): RIGHT TOTAL HIP ARTHROPLASTY ANTERIOR APPROACH (Right) Patient reports pain as mild.   Patient seen in rounds with Dr. Alvan Dame. Patient is well, and has had no acute complaints or problems other than some pain in the operative leg. No issues overnight. No SOB or chest pain.    Objective: Vital signs in last 24 hours: Temp:  [97.5 F (36.4 C)-98.8 F (37.1 C)] 97.6 F (36.4 C) (08/15 0614) Pulse Rate:  [56-109] 72 (08/15 0614) Resp:  [10-21] 18 (08/15 0614) BP: (107-143)/(50-99) 126/90 (08/15 0614) SpO2:  [94 %-100 %] 99 % (08/15 0614)  Intake/Output from previous day:  Intake/Output Summary (Last 24 hours) at 08/04/17 0813 Last data filed at 08/04/17 0615  Gross per 24 hour  Intake          4352.17 ml  Output             3600 ml  Net           752.17 ml     Labs:  Recent Labs  08/04/17 0539  HGB 12.8*    Recent Labs  08/04/17 0539  WBC 15.3*  RBC 3.86*  HCT 36.6*  PLT 173    Recent Labs  08/04/17 0539  NA 138  K 4.2  CL 104  CO2 29  BUN 10  CREATININE 0.93  GLUCOSE 129*  CALCIUM 8.5*    EXAM General - Patient is Alert and Oriented Extremity - Neurologically intact Intact pulses distally Dorsiflexion/Plantar flexion intact No cellulitis present Compartment soft Dressing/Incision - clean, dry, no drainage Motor Function - intact, moving foot and toes well on exam.   Past Medical History:  Diagnosis Date  . Allergy    occ. seasonal  . Anxiety   . Arthritis   . Colon polyp   . Condyloma    anal  . Depression   . GERD (gastroesophageal reflux disease)   . History of transfusion    as child  . Hypertension   . Knee injury    right knee cap w piece broken off- MRI done 02-06-15-pending surgery. 11-18-16 right knee is still painful, both shoulders(limited ROM right).  . Knee pain   . Neuropathy    bilateral- greater left  . Palpitations    normal stress test 10 yrs ago  . Pneumonia   .  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   Weight as of this encounter: 108.9 kg (240 lb). Advance diet Up with therapy D/C IV fluids when tolerating POs well  DVT Prophylaxis - Aspirin Weight-Bearing as tolerated  He will get up with therapy today. Plan for DC home this afternoon after meeting PT goals.   Ardeen Jourdain, PA-C Orthopaedic Surgery 08/04/2017, 8:13 AM

## 2017-08-04 NOTE — Discharge Summary (Signed)
Physician Discharge Summary   Patient ID: Philip Humphrey MRN: [id number] DOB/AGE: [birth year] 56 y.o.  Admit date: 08/03/2017 Discharge date: 08/04/2017  Primary Diagnosis: Primary osteoarthritis right hip   Admission Diagnoses:  Past Medical History:  Diagnosis Date  . Allergy    occ. seasonal  . Anxiety   . Arthritis   . Colon polyp   . Condyloma    anal  . Depression   . GERD (gastroesophageal reflux disease)   . History of transfusion    as child  . Hypertension   . Knee injury    right knee cap w piece broken off- MRI done 02-06-15-pending surgery. 11-18-16 right knee is still painful, both shoulders(limited ROM right).  . Knee pain   . Neuropathy    bilateral- greater left  . Palpitations    normal stress test 10 yrs ago  . Pneumonia   . Thyroid disease    Thyroid removed unintentionally at age 57- no further thyroid tissue remains-uses daily supplement   Discharge Diagnoses:   Active Problems:   Status post total hip replacement, right  Estimated body mass index is 34.44 kg/m as calculated from the following:   Height as of this encounter: '5\' 10"'$  (1.778 m).   Weight as of this encounter: 108.9 kg (240 lb).  Procedure(s) (LRB): RIGHT TOTAL HIP ARTHROPLASTY ANTERIOR APPROACH (Right)   Consults: None  HPI: Philip Humphrey, 56 y.o. male, has a history of pain and functional disability in the right hip(s) due to arthritis and patient has failed non-surgical conservative treatments for greater than 12 weeks to include NSAID's and/or analgesics, corticosteriod injections, use of assistive devices and activity modification.  Onset of symptoms was gradual starting months ago with gradually worsening course since that time.The patient noted prior procedures of the hip to include arthroplasty on the left hip(s).  Patient currently rates pain in the right hip at 9 out of 10 with activity. Patient has night pain, worsening of pain with activity and weight bearing,  trendelenberg gait, pain that interfers with activities of daily living and pain with passive range of motion. Patient has evidence of periarticular osteophytes and joint space narrowing by imaging studies. This condition presents safety issues increasing the risk of falls.  There is no current active infection. Risks, benefits and expectations were discussed with the patient.  Risks including but not limited to the risk of anesthesia, blood clots, nerve damage, blood vessel damage, failure of the prosthesis, infection and up to and including death.  Patient understand the risks, benefits and expectations and wishes to proceed with surgery.   Laboratory Data: Admission on 08/03/2017, Discharged on 08/04/2017  Component Date Value Ref Range Status  . WBC 08/04/2017 15.3* 4.0 - 10.5 K/uL Final  . RBC 08/04/2017 3.86* 4.22 - 5.81 MIL/uL Final  . Hemoglobin 08/04/2017 12.8* 13.0 - 17.0 g/dL Final  . HCT 08/04/2017 36.6* 39.0 - 52.0 % Final  . MCV 08/04/2017 94.8  78.0 - 100.0 fL Final  . MCH 08/04/2017 33.2  26.0 - 34.0 pg Final  . MCHC 08/04/2017 35.0  30.0 - 36.0 g/dL Final  . RDW 08/04/2017 12.0  11.5 - 15.5 % Final  . Platelets 08/04/2017 173  150 - 400 K/uL Final  . Sodium 08/04/2017 138  135 - 145 mmol/L Final  . Potassium 08/04/2017 4.2  3.5 - 5.1 mmol/L Final  . Chloride 08/04/2017 104  101 - 111 mmol/L Final  . CO2 08/04/2017 29  22 - 32 mmol/L Final  .  Glucose, Bld 08/04/2017 129* 65 - 99 mg/dL Final  . BUN 08/04/2017 10  6 - 20 mg/dL Final  . Creatinine, Ser 08/04/2017 0.93  0.61 - 1.24 mg/dL Final  . Calcium 08/04/2017 8.5* 8.9 - 10.3 mg/dL Final  . GFR calc non Af Amer 08/04/2017 >60  >60 mL/min Final  . GFR calc Af Amer 08/04/2017 >60  >60 mL/min Final   Comment: (NOTE) The eGFR has been calculated using the CKD EPI equation. This calculation has not been validated in all clinical situations. eGFR's persistently <60 mL/min signify possible Chronic Kidney Disease.   Philip Humphrey  gap 08/04/2017 5  5 - 15 Final  Hospital Outpatient Visit on 07/26/2017  Component Date Value Ref Range Status  . ABO/RH(D) 07/26/2017 A POS   Final  . Antibody Screen 07/26/2017 NEG   Final  . Sample Expiration 07/26/2017 08/06/2017   Final  . WBC 07/26/2017 9.5  4.0 - 10.5 K/uL Final  . RBC 07/26/2017 4.42  4.22 - 5.81 MIL/uL Final  . Hemoglobin 07/26/2017 15.1  13.0 - 17.0 g/dL Final  . HCT 07/26/2017 42.3  39.0 - 52.0 % Final  . MCV 07/26/2017 95.7  78.0 - 100.0 fL Final  . MCH 07/26/2017 34.2* 26.0 - 34.0 pg Final  . MCHC 07/26/2017 35.7  30.0 - 36.0 g/dL Final  . RDW 07/26/2017 12.2  11.5 - 15.5 % Final  . Platelets 07/26/2017 174  150 - 400 K/uL Final  . MRSA, PCR 07/26/2017 NEGATIVE  NEGATIVE Final  . Staphylococcus aureus 07/26/2017 NEGATIVE  NEGATIVE Final   Comment:        The Xpert SA Assay (FDA approved for NASAL specimens in patients over 87 years of age), is one component of a comprehensive surveillance program.  Test performance has been validated by [facility] for patients greater than or equal to 8 year old. It is not intended to diagnose infection nor to guide or monitor treatment.      X-Rays:Dg C-arm 1-60 Min-no Report  Result Date: 08/03/2017 Fluoroscopy was utilized by the requesting physician.  No radiographic interpretation.   Dg Hip Port Unilat With Pelvis 1v Right  Result Date: 08/03/2017 CLINICAL DATA:  Right hip replacement EXAM: DG HIP (WITH OR WITHOUT PELVIS) 1V PORT RIGHT COMPARISON:  None. FINDINGS: Interval right total hip arthroplasty without failure complication. No fracture or dislocation. Postsurgical changes around the right hip. Prior left hip arthroplasty without failure complication. IMPRESSION: Interval right total hip arthroplasty. Electronically Signed   By: Kathreen Devoid   On: 08/03/2017 09:59    EKG: Orders placed or performed during the hospital encounter of 02/12/15  . EKG 12-Lead  . EKG 12-Lead     Hospital Course:  Patient was admitted to [facility] and taken to the OR and underwent the above state procedure without complications.  Patient tolerated the procedure well and was later transferred to the recovery room and then to the orthopaedic floor for postoperative care.  They were given PO and IV analgesics for pain control following their surgery.  They were given 24 hours of postoperative antibiotics of  Anti-infectives    Start     Dose/Rate Route Frequency Ordered Stop   08/03/17 1400  ceFAZolin (ANCEF) IVPB 1 g/50 mL premix     1 g 100 mL/hr over 30 Minutes Intravenous Every 6 hours 08/03/17 1051 08/03/17 2020   08/03/17 0520  ceFAZolin (ANCEF) 2-4 GM/100ML-% IVPB    Comments:  Waldron Session   :  cabinet override      08/03/17 0520 08/03/17 0725   08/03/17 0513  ceFAZolin (ANCEF) IVPB 2g/100 mL premix     2 g 200 mL/hr over 30 Minutes Intravenous On call to O.R. 08/03/17 1324 08/03/17 0735     and started on DVT prophylaxis in the form of Aspirin.   PT and OT were ordered for total hip protocol.  The patient was allowed to be WBAT with therapy. Discharge planning was consulted to help with postop disposition and equipment needs.  Patient had a fair night on the evening of surgery.  He was volume depleted, showing some hypotension. He was given a fluid bolus and IV fluid rate increased. They started to get up OOB with therapy on day one.  The patient had progressed with therapy and meeting their goals. Patient was seen in rounds and was ready to go home.   Diet: Cardiac diet Activity:WBAT No bending hip over 90 degrees- A "L" Angle Do not cross legs Do not let foot roll inward When turning these patients a pillow should be placed between the patient's legs to prevent crossing. Patients should have the affected knee fully extended when trying to sit or stand from all surfaces to prevent excessive hip flexion. When ambulating and turning toward the affected side the affected leg should have  the toes turned out prior to moving the walker and the rest of patient's body as to prevent internal rotation/ turning in of the leg. Abduction pillows are the most effective way to prevent a patient from not crossing legs or turning toes in at rest. If an abduction pillow is not ordered placing a regular pillow length wise between the patient's legs is also an effective reminder. It is imperative that these precautions be maintained so that the surgical hip does not dislocate. Follow-up:in 2 weeks Disposition - Home Discharged Condition: stable   Discharge Instructions    Call MD / Call 911    Complete by:  As directed    If you experience chest pain or shortness of breath, CALL 911 and be transported to the hospital emergency room.  If you develope a fever above 101 F, pus (white drainage) or increased drainage or redness at the wound, or calf pain, call your surgeon's office.   Constipation Prevention    Complete by:  As directed    Drink plenty of fluids.  Prune juice may be helpful.  You may use a stool softener, such as Colace (over the counter) 100 mg twice a day.  Use MiraLax (over the counter) for constipation as needed.   Diet - low sodium heart healthy    Complete by:  As directed    Discharge instructions    Complete by:  As directed    INSTRUCTIONS AFTER JOINT REPLACEMENT   Remove items at home which could result in a fall. This includes throw rugs or furniture in walking pathways ICE to the affected joint every three hours while awake for 30 minutes at a time, for at least the first 3-5 days, and then as needed for pain and swelling.  Continue to use ice for pain and swelling. You may notice swelling that will progress down to the foot and ankle.  This is normal after surgery.  Elevate your leg when you are not up walking on it.   Continue to use the breathing machine you got in the hospital (incentive spirometer) which will help keep your temperature down.  It is common for your  temperature to cycle up and down following surgery, especially at night when you are not up moving around and exerting yourself.  The breathing machine keeps your lungs expanded and your temperature down.   DIET:  As you were doing prior to hospitalization, we recommend a well-balanced diet.  DRESSING / WOUND CARE / SHOWERING  Keep the surgical dressing until follow up.  The dressing is water proof, so you can shower without any extra covering.  IF THE DRESSING FALLS OFF or the wound gets wet inside, change the dressing with sterile gauze.  Please use good hand washing techniques before changing the dressing.  Do not use any lotions or creams on the incision until instructed by your surgeon.    ACTIVITY  Increase activity slowly as tolerated, but follow the weight bearing instructions below.   No driving for 6 weeks or until further direction given by your physician.  You cannot drive while taking narcotics.  No lifting or carrying greater than 10 lbs. until further directed by your surgeon. Avoid periods of inactivity such as sitting longer than an hour when not asleep. This helps prevent blood clots.  You may return to work once you are authorized by your doctor.     WEIGHT BEARING   Weight bearing as tolerated with assist device (walker, cane, etc) as directed, use it as long as suggested by your surgeon or therapist, typically at least 4-6 weeks.   EXERCISES  Results after joint replacement surgery are often greatly improved when you follow the exercise, range of motion and muscle strengthening exercises prescribed by your doctor. Safety measures are also important to protect the joint from further injury. Any time any of these exercises cause you to have increased pain or swelling, decrease what you are doing until you are comfortable again and then slowly increase them. If you have problems or questions, call your caregiver or physical therapist for advice.   Rehabilitation is  important following a joint replacement. After just a few days of immobilization, the muscles of the leg can become weakened and shrink (atrophy).  These exercises are designed to build up the tone and strength of the thigh and leg muscles and to improve motion. Often times heat used for twenty to thirty minutes before working out will loosen up your tissues and help with improving the range of motion but do not use heat for the first two weeks following surgery (sometimes heat can increase post-operative swelling).   These exercises can be done on a training (exercise) mat, on the floor, on a table or on a bed. Use whatever works the best and is most comfortable for you.    Use music or television while you are exercising so that the exercises are a pleasant break in your day. This will make your life better with the exercises acting as a break in your routine that you can look forward to.   Perform all exercises about fifteen times, three times per day or as directed.  You should exercise both the operative leg and the other leg as well.  Exercises include:   Quad Sets - Tighten up the muscle on the front of the thigh (Quad) and hold for 5-10 seconds.   Straight Leg Raises - With your knee straight (if you were given a brace, keep it on), lift the leg to 60 degrees, hold for 3 seconds, and slowly lower the leg.  Perform this exercise against resistance later as your leg gets stronger.  Leg  Slides: Lying on your back, slowly slide your foot toward your buttocks, bending your knee up off the floor (only go as far as is comfortable). Then slowly slide your foot back down until your leg is flat on the floor again.  Angel Wings: Lying on your back spread your legs to the side as far apart as you can without causing discomfort.  Hamstring Strength:  Lying on your back, push your heel against the floor with your leg straight by tightening up the muscles of your buttocks.  Repeat, but this time bend your knee to  a comfortable angle, and push your heel against the floor.  You may put a pillow under the heel to make it more comfortable if necessary.   A rehabilitation program following joint replacement surgery can speed recovery and prevent re-injury in the future due to weakened muscles. Contact your doctor or a physical therapist for more information on knee rehabilitation.    CONSTIPATION  Constipation is defined medically as fewer than three stools per week and severe constipation as less than one stool per week.  Even if you have a regular bowel pattern at home, your normal regimen is likely to be disrupted due to multiple reasons following surgery.  Combination of anesthesia, postoperative narcotics, change in appetite and fluid intake all can affect your bowels.   YOU MUST use at least one of the following options; they are listed in order of increasing strength to get the job done.  They are all available over the counter, and you may need to use some, POSSIBLY even all of these options:    Drink plenty of fluids (prune juice may be helpful) and high fiber foods Colace 100 mg by mouth twice a day  Senokot for constipation as directed and as needed Dulcolax (bisacodyl), take with full glass of water  Miralax (polyethylene glycol) once or twice a day as needed.  If you have tried all these things and are unable to have a bowel movement in the first 3-4 days after surgery call either your surgeon or your primary doctor.    If you experience loose stools or diarrhea, hold the medications until you stool forms back up.  If your symptoms do not get better within 1 week or if they get worse, check with your doctor.  If you experience "the worst abdominal pain ever" or develop nausea or vomiting, please contact the office immediately for further recommendations for treatment.   ITCHING:  If you experience itching with your medications, try taking only a single pain pill, or even half a pain pill at a  time.  You can also use Benadryl over the counter for itching or also to help with sleep.   TED HOSE STOCKINGS:  Use stockings on both legs until for at least 2 weeks or as directed by physician office. They may be removed at night for sleeping.  MEDICATIONS:  See your medication summary on the "After Visit Summary" that nursing will review with you.  You may have some home medications which will be placed on hold until you complete the course of blood thinner medication.  It is important for you to complete the blood thinner medication as prescribed.  PRECAUTIONS:  If you experience chest pain or shortness of breath - call 911 immediately for transfer to the hospital emergency department.   If you develop a fever greater that 101 F, purulent drainage from wound, increased redness or drainage from wound, foul odor from the wound/dressing,  or calf pain - CONTACT YOUR SURGEON.                                                   FOLLOW-UP APPOINTMENTS:  If you do not already have a post-op appointment, please call the office for an appointment to be seen by your surgeon.  Guidelines for how soon to be seen are listed in your "After Visit Summary", but are typically between 1-4 weeks after surgery.   MAKE SURE YOU:  Understand these instructions.  Get help right away if you are not doing well or get worse.    Thank you for letting us be a part of your medical care team.  It is a privilege we respect greatly.  We hope these instructions will help you stay on track for a fast and full recovery!   Increase activity slowly as tolerated    Complete by:  As directed      Allergies as of 08/04/2017      Reactions   Sulfa Antibiotics Anaphylaxis, Swelling   MOUTH AND THROAT   Oxycodone Itching      Medication List    STOP taking these medications   acetaminophen 500 MG tablet Commonly known as:  TYLENOL     TAKE these medications   aspirin 81 MG chewable tablet Chew 1 tablet (81 mg total) by  mouth 2 (two) times daily. What changed:  additional instructions   atorvastatin 80 MG tablet Commonly known as:  LIPITOR Take 80 mg by mouth daily.   calcium carbonate 500 MG chewable tablet Commonly known as:  TUMS - dosed in mg elemental calcium Chew 2 tablets by mouth daily as needed for indigestion or heartburn.   diphenhydramine-acetaminophen 25-500 MG Tabs tablet Commonly known as:  TYLENOL PM Take 2 tablets by mouth at bedtime as needed (SLEEP).   docusate sodium 100 MG capsule Commonly known as:  COLACE Take 1 capsule (100 mg total) by mouth 2 (two) times daily. What changed:  when to take this  reasons to take this   ferrous sulfate 325 (65 FE) MG tablet Take 1 tablet (325 mg total) by mouth 2 (two) times daily with a meal. What changed:  when to take this   gabapentin 300 MG capsule Commonly known as:  NEURONTIN Take 300 mg by mouth 3 (three) times daily.   HYDROcodone-acetaminophen 7.5-325 MG tablet Commonly known as:  NORCO Take 1-2 tablets by mouth every 4 (four) hours as needed (breakthrough pain). What changed:  reasons to take this   lisinopril 20 MG tablet Commonly known as:  PRINIVIL,ZESTRIL Take 20 mg by mouth daily.   methocarbamol 500 MG tablet Commonly known as:  ROBAXIN Take 1 tablet (500 mg total) by mouth every 6 (six) hours as needed for muscle spasms.   naproxen sodium 220 MG tablet Commonly known as:  ANAPROX Take 220 mg by mouth 2 (two) times daily as needed (PAIN).   omeprazole 40 MG capsule Commonly known as:  PRILOSEC Take 1 capsule (40 mg total) by mouth daily. PATIENT NEEDS OFFICE VISIT FOR ADDITIONAL REFILLS   polyethylene glycol packet Commonly known as:  MIRALAX / GLYCOLAX Take 17 g by mouth 2 (two) times daily.   sertraline 25 MG tablet Commonly known as:  ZOLOFT Take 25 mg by mouth daily as needed (ANXIETY).   SYNTHROID  175 MCG tablet Generic drug:  levothyroxine Take 1 tablet (175 mcg total) by mouth daily. NO  MORE REFILLS WITHOUT OFFICE VISIT - 2ND NOTICE What changed:  when to take this  additional instructions      Follow-up Information    Paralee Cancel, MD. Schedule an appointment as soon as possible for a visit in 2 week(s).   Specialty:  Orthopedic Surgery Contact information: [address] [place] [address] [phone]           Signed: Ardeen Jourdain, PA-C Orthopaedic Surgery 08/04/2017, 12:16 PM

## 2017-08-04 NOTE — Progress Notes (Signed)
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 Ready for Dc. All education completed.    Follow Up Recommendations  DC plan and follow up therapy as arranged by surgeon     Equipment Recommendations  None recommended by PT    Recommendations for Other Services       Precautions / Restrictions Precautions Precautions: Fall    Mobility  Bed Mobility Overal bed mobility: Needs Assistance Bed Mobility: Supine to Sit     Supine to sit: Min assist     General bed mobility comments: assist with trunk  Transfers Overall transfer level: Needs assistance Equipment used: Rolling walker (2 wheeled) Transfers: Sit to/from Stand Sit to Stand: Supervision         General transfer comment: use of rail for low toilet  Ambulation/Gait Ambulation/Gait assistance: Supervision Ambulation Distance (Feet): 300 Feet Assistive device: Rolling walker (2 wheeled) Gait Pattern/deviations: Step-to pattern;Step-through pattern;Decreased stride length;Antalgic     General Gait Details: cues for sequence   Stairs Stairs: Yes   Stair Management: One rail Left;Step to pattern;Forwards Number of Stairs: 2 General stair comments: cues for sequence  Wheelchair Mobility    Modified Rankin (Stroke Patients Only)       Balance                                            Cognition Arousal/Alertness: Awake/alert                                            Exercises Total Joint Exercises Ankle Circles/Pumps: AROM;Both;10 reps Short Arc Quad: AROM;Right;10 reps Heel Slides: AAROM;Right;10 reps Hip ABduction/ADduction: AROM;Right;Standing;10 reps Marching in Standing: AROM;Right;10 reps;Standing Standing Hip Extension: AROM;Right;10 reps;Standing     General Comments        Pertinent Vitals/Pain Pain Score: 4  Pain Location: right hip Pain Descriptors / Indicators: Sore Pain Intervention(s): Premedicated before session;Monitored during session;Ice applied    Home Living                      Prior Function            PT Goals (current goals can now be found in the care plan section) Progress towards PT goals: Progressing toward goals    Frequency    7X/week      PT Plan Current plan remains appropriate    Co-evaluation              AM-PAC PT "6 Clicks" Daily Activity  Outcome Measure  Difficulty turning over in bed (including adjusting bedclothes, sheets and blankets)?: Total Difficulty moving from lying on back to sitting on the side of the bed? : Total Difficulty sitting down on and standing up from a chair with arms (e.g., wheelchair, bedside commode, etc,.)?: A Little Help needed moving to and from a bed to chair (including a wheelchair)?: A Little Help needed walking in hospital room?: A Little Help needed climbing 3-5 steps with a railing? : A Little 6 Click Score: 14    End of Session   Activity  Tolerance: Patient tolerated treatment well Patient left: in chair;with call bell/phone within reach Nurse Communication: Mobility status PT Visit Diagnosis: Difficulty in walking, not elsewhere classified (R26.2);Pain Pain - Right/Left: Right Pain - part of body: Hip     Time: 0825-0900 PT Time Calculation (min) (ACUTE ONLY): 35 min  Charges:  $Gait Training: 8-22 mins $Therapeutic Exercise: 8-22 mins                    G Codes:  Functional Assessment Tool Used: AM-PAC 6 Clicks Basic Mobility   [facility] [phone]  Philip Humphrey 08/04/2017, 9:15 AM

## 2017-08-04 NOTE — Progress Notes (Signed)
Discharge planning, no HH needs identified. [phone] 

## 2017-08-19 ENCOUNTER — Other Ambulatory Visit (HOSPITAL_COMMUNITY): Payer: Self-pay

## 2017-10-12 NOTE — Progress Notes (Signed)
Please place orders in EPIC as patient is being scheduled for a pre-op appointment! Thank you! 

## 2017-10-14 ENCOUNTER — Encounter: Payer: Self-pay | Admitting: Surgery

## 2017-10-14 ENCOUNTER — Ambulatory Visit: Payer: Self-pay | Admitting: Surgery

## 2017-10-14 NOTE — Progress Notes (Signed)
Please place orders in EPIC as patient has a pre-op appointment on 10/15/2017! Thank you!

## 2017-10-14 NOTE — Progress Notes (Signed)
Need orders in epic  Surgery on 10/18/17.  preop on 10/26.  Thank You,.

## 2017-10-14 NOTE — H&P (Signed)
Philip Humphrey 09/29/2017 3:06 PM Location: [place] [facility] Patient #: [id number] DOB: May 20, 1961 Married / Language: Philip Humphrey / Race: White Male   History of Present Illness Philip Humphrey B. 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 He has several trigger points that are causing a lot of pain and he can't rest well at night. These are firm lipomata in his anterior chest and upper abdomen. In addition he has a small supra umbilical hernia that he can feel and I can feel from time to time. The lipomata are about up to 1-1/2 cm in greatest dimension. They all are tender trigger points when they're pressed.  We discussed removal of these proximally 6 in number and also going in with the laparoscope repairing his ventral hernia. I think I'll like putting to sleep and do the ventral hernia exploration first and then with a marked other lipomata and cut down and remove those individually.  He has had both shoulders operated on, bilateral hips and bilateral knees. 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  The patient is a 56 year old male.   Allergies Philip Humphrey, CMA; 09/29/2017 3:06 PM) Sulfa Drugs  Oxycodone-Aspirin *ANALGESICS - OPIOID*   Medication History Philip Humphrey, CMA; 09/29/2017 3:07 PM) Lisinopril (20MG  Tablet, Oral) Active. TraMADol HCl (50MG  Tablet, Oral) Active. Atorvastatin Calcium (80MG  Tablet, Oral) Active. Sertraline HCl (25MG  Tablet, Oral) Active. Omeprazole (40MG  Capsule DR, Oral) Active. Medications Reconciled  Vitals (Philip Humphrey CMA; 09/29/2017 3:06 PM) 09/29/2017 3:06 PM Weight: 240.8 lb Height: 69in Body Surface Area: 2.24 m Body Mass Index: 35.56 kg/m  Pulse: 76 (Regular)  BP: 130/78 (Sitting, Left Arm, Standard)       Physical Exam (Philip Klare B. Hassell Done MD;  09/29/2017 4:19 PM) General Note: WMNAD HEENT head shaved Chest clear -multiple lipomata of the anterior right and left chest Heart SR Abdomen with tender area above the umbilicus consistent with a herniated fat mass     Assessment & Plan Philip Humphrey B. Hassell Done MD; 09/29/2017 4:21 PM) Philip Humphrey (E88.2) Impression: Will schedule at [facility] under general anesthesia: removal of multiple lipomata and repair umbilical hernia

## 2017-10-15 ENCOUNTER — Encounter (HOSPITAL_COMMUNITY): Payer: Self-pay

## 2017-10-15 ENCOUNTER — Encounter (HOSPITAL_COMMUNITY)
Admission: RE | Admit: 2017-10-15 | Discharge: 2017-10-15 | Disposition: A | Discharge status: Home / Self Care | Payer: 59 | Source: Ambulatory Visit | Attending: Surgery | Admitting: Surgery

## 2017-10-15 ENCOUNTER — Encounter (INDEPENDENT_AMBULATORY_CARE_PROVIDER_SITE_OTHER): Payer: Self-pay

## 2017-10-15 DIAGNOSIS — K429 Umbilical hernia without obstruction or gangrene: Secondary | ICD-10-CM | POA: Diagnosis not present

## 2017-10-15 DIAGNOSIS — D1779 Benign lipomatous neoplasm of other sites: Secondary | ICD-10-CM | POA: Diagnosis not present

## 2017-10-15 DIAGNOSIS — Z87891 Personal history of nicotine dependence: Secondary | ICD-10-CM | POA: Diagnosis not present

## 2017-10-15 DIAGNOSIS — Z7982 Long term (current) use of aspirin: Secondary | ICD-10-CM | POA: Diagnosis not present

## 2017-10-15 DIAGNOSIS — Z885 Allergy status to narcotic agent status: Secondary | ICD-10-CM | POA: Diagnosis not present

## 2017-10-15 DIAGNOSIS — F419 Anxiety disorder, unspecified: Secondary | ICD-10-CM | POA: Diagnosis not present

## 2017-10-15 DIAGNOSIS — I10 Essential (primary) hypertension: Secondary | ICD-10-CM | POA: Diagnosis not present

## 2017-10-15 DIAGNOSIS — E039 Hypothyroidism, unspecified: Secondary | ICD-10-CM | POA: Diagnosis not present

## 2017-10-15 DIAGNOSIS — Z79899 Other long term (current) drug therapy: Secondary | ICD-10-CM | POA: Diagnosis not present

## 2017-10-15 DIAGNOSIS — M1990 Unspecified osteoarthritis, unspecified site: Secondary | ICD-10-CM | POA: Diagnosis not present

## 2017-10-15 DIAGNOSIS — Z6835 Body mass index (BMI) 35.0-35.9, adult: Secondary | ICD-10-CM | POA: Diagnosis not present

## 2017-10-15 DIAGNOSIS — E785 Hyperlipidemia, unspecified: Secondary | ICD-10-CM | POA: Diagnosis not present

## 2017-10-15 DIAGNOSIS — Z882 Allergy status to sulfonamides status: Secondary | ICD-10-CM | POA: Diagnosis not present

## 2017-10-15 DIAGNOSIS — K219 Gastro-esophageal reflux disease without esophagitis: Secondary | ICD-10-CM | POA: Diagnosis not present

## 2017-10-15 DIAGNOSIS — E669 Obesity, unspecified: Secondary | ICD-10-CM | POA: Diagnosis not present

## 2017-10-15 DIAGNOSIS — F329 Major depressive disorder, single episode, unspecified: Secondary | ICD-10-CM | POA: Diagnosis not present

## 2017-10-15 LAB — BASIC METABOLIC PANEL
Anion gap: 8 (ref 5–15)
BUN: 11 mg/dL (ref 6–20)
CALCIUM: 8.9 mg/dL (ref 8.9–10.3)
CHLORIDE: 106 mmol/L (ref 101–111)
CO2: 25 mmol/L (ref 22–32)
CREATININE: 0.93 mg/dL (ref 0.61–1.24)
GFR calc non Af Amer: >60 mL/min (ref >60)
GLUCOSE: 135 mg/dL — AB (ref 65–99)
Potassium: 3.7 mmol/L (ref 3.5–5.1)
Sodium: 139 mmol/L (ref 135–145)

## 2017-10-15 LAB — CBC
HEMATOCRIT: 41.9 % (ref 39.0–52.0)
Hemoglobin: 14.4 g/dL (ref 13.0–17.0)
MCH: 33 pg (ref 26.0–34.0)
MCHC: 34.4 g/dL (ref 30.0–36.0)
MCV: 95.9 fL (ref 78.0–100.0)
Platelets: 188 10*3/uL (ref 150–400)
RBC: 4.37 MIL/uL (ref 4.22–5.81)
RDW: 12.9 % (ref 11.5–15.5)
WBC: 7.3 10*3/uL (ref 4.0–10.5)

## 2017-10-15 NOTE — Patient Instructions (Addendum)
Vaishnav Demartin Mojica  10/15/2017   Your procedure is scheduled on: 10/18/17    Report to [facility] Main  Entrance Take [place]  elevators to 3rd floor to  [place] at    1245pm.    Call this number if you have problems the morning of surgery [phone]    Remember: ONLY 1 PERSON MAY GO WITH YOU TO SHORT STAY TO GET  READY MORNING OF YOUR SURGERY.  Do not eat food or drink liquids :After Midnight.     Take these medicines the morning of surgery with A SIP OF WATER:                                 You may not have any metal on your body including hair pins and              piercings  Do not wear jewelry, , lotions, powders or perfumes, deodorant                      Men may shave face and neck.   Do not bring valuables to the hospital. [place].  Contacts, dentures or bridgework may not be worn into surgery. .     Patients discharged the day of surgery will not be allowed to drive home.  Name and phone number of your driver:  Special Instructions: N/A              Please read over the following fact sheets you were given: _____________________________________________________________________             [facility] - Preparing for Surgery Before surgery, you can play an important role.  Because skin is not sterile, your skin needs to be as free of germs as possible.  You can reduce the number of germs on your skin by washing with CHG (chlorahexidine gluconate) soap before surgery.  CHG is an antiseptic cleaner which kills germs and bonds with the skin to continue killing germs even after washing. Please DO NOT use if you have an allergy to CHG or antibacterial soaps.  If your skin becomes reddened/irritated stop using the CHG and inform your nurse when you arrive at Short Stay. Do not shave (including legs and underarms) for at least 48 hours prior to the first CHG shower.  You may shave your  face/neck. Please follow these instructions carefully:  1.  Shower with CHG Soap the night before surgery and the  morning of Surgery.  2.  If you choose to wash your hair, wash your hair first as usual with your  normal  shampoo.  3.  After you shampoo, rinse your hair and body thoroughly to remove the  shampoo.                           4.  Use CHG as you would any other liquid soap.  You can apply chg directly  to the skin and wash                       Gently with a scrungie or clean washcloth.  5.  Apply the CHG Soap to  your body ONLY FROM THE NECK DOWN.   Do not use on face/ open                           Wound or open sores. Avoid contact with eyes, ears mouth and genitals (private parts).                       Wash face,  Genitals (private parts) with your normal soap.             6.  Wash thoroughly, paying special attention to the area where your surgery  will be performed.  7.  Thoroughly rinse your body with warm water from the neck down.  8.  DO NOT shower/wash with your normal soap after using and rinsing off  the CHG Soap.                9.  Pat yourself dry with a clean towel.            10.  Wear clean pajamas.            11.  Place clean sheets on your bed the night of your first shower and do not  sleep with pets. Day of Surgery : Do not apply any lotions/deodorants the morning of surgery.  Please wear clean clothes to the hospital/surgery center.  FAILURE TO FOLLOW THESE INSTRUCTIONS MAY RESULT IN THE CANCELLATION OF YOUR SURGERY PATIENT SIGNATURE_________________________________  NURSE SIGNATURE__________________________________  ________________________________________________________________________    CLEAR LIQUID DIET   Foods Allowed                                                                     Foods Excluded  Coffee and tea, regular and decaf                             liquids that you cannot  Plain Jell-O in any flavor                                              see through such as: Fruit ices (not with fruit pulp)                                     milk, soups, orange juice  Iced Popsicles                                    All solid food Carbonated beverages, regular and diet                                    Cranberry, grape and apple juices Sports drinks like Gatorade Lightly seasoned clear broth or consume(fat free) Sugar, honey syrup  Sample Menu Breakfast  Lunch                                     Supper Cranberry juice                    Beef broth                            Chicken broth Jell-O                                     Grape juice                           Apple juice Coffee or tea                        Jell-O                                      Popsicle                                                Coffee or tea                        Coffee or tea  _____________________________________________________________________

## 2017-10-15 NOTE — Progress Notes (Signed)
EKG-07/21/17-on chart  09/22/17-LOV-Dr Kristie Cowman and 07/21/17 office visit on chart

## 2017-10-18 ENCOUNTER — Ambulatory Visit (HOSPITAL_COMMUNITY)
Admission: RE | Admit: 2017-10-18 | Discharge: 2017-10-18 | Disposition: A | Discharge status: Home / Self Care | Payer: 59 | Source: Ambulatory Visit | Attending: Surgery | Admitting: Surgery

## 2017-10-18 ENCOUNTER — Encounter (HOSPITAL_COMMUNITY): Payer: Self-pay | Admitting: *Deleted

## 2017-10-18 ENCOUNTER — Ambulatory Visit (HOSPITAL_COMMUNITY): Payer: 59 | Admitting: Certified Registered Nurse Anesthetist

## 2017-10-18 ENCOUNTER — Encounter (HOSPITAL_COMMUNITY)
Admission: RE | Disposition: A | Discharge status: Home / Self Care | Payer: Self-pay | Source: Ambulatory Visit | Attending: Surgery

## 2017-10-18 DIAGNOSIS — E039 Hypothyroidism, unspecified: Secondary | ICD-10-CM | POA: Insufficient documentation

## 2017-10-18 DIAGNOSIS — D1779 Benign lipomatous neoplasm of other sites: Secondary | ICD-10-CM | POA: Insufficient documentation

## 2017-10-18 DIAGNOSIS — Z6835 Body mass index (BMI) 35.0-35.9, adult: Secondary | ICD-10-CM | POA: Insufficient documentation

## 2017-10-18 DIAGNOSIS — I10 Essential (primary) hypertension: Secondary | ICD-10-CM | POA: Insufficient documentation

## 2017-10-18 DIAGNOSIS — F329 Major depressive disorder, single episode, unspecified: Secondary | ICD-10-CM | POA: Insufficient documentation

## 2017-10-18 DIAGNOSIS — K429 Umbilical hernia without obstruction or gangrene: Secondary | ICD-10-CM | POA: Diagnosis not present

## 2017-10-18 DIAGNOSIS — M1990 Unspecified osteoarthritis, unspecified site: Secondary | ICD-10-CM | POA: Insufficient documentation

## 2017-10-18 DIAGNOSIS — K219 Gastro-esophageal reflux disease without esophagitis: Secondary | ICD-10-CM | POA: Insufficient documentation

## 2017-10-18 DIAGNOSIS — Z79899 Other long term (current) drug therapy: Secondary | ICD-10-CM | POA: Insufficient documentation

## 2017-10-18 DIAGNOSIS — E785 Hyperlipidemia, unspecified: Secondary | ICD-10-CM | POA: Insufficient documentation

## 2017-10-18 DIAGNOSIS — F419 Anxiety disorder, unspecified: Secondary | ICD-10-CM | POA: Insufficient documentation

## 2017-10-18 DIAGNOSIS — Z882 Allergy status to sulfonamides status: Secondary | ICD-10-CM | POA: Insufficient documentation

## 2017-10-18 DIAGNOSIS — Z87891 Personal history of nicotine dependence: Secondary | ICD-10-CM | POA: Insufficient documentation

## 2017-10-18 DIAGNOSIS — Z885 Allergy status to narcotic agent status: Secondary | ICD-10-CM | POA: Insufficient documentation

## 2017-10-18 DIAGNOSIS — E669 Obesity, unspecified: Secondary | ICD-10-CM | POA: Insufficient documentation

## 2017-10-18 DIAGNOSIS — Z7982 Long term (current) use of aspirin: Secondary | ICD-10-CM | POA: Insufficient documentation

## 2017-10-18 HISTORY — PX: UMBILICAL HERNIA REPAIR: SHX196

## 2017-10-18 SURGERY — REPAIR, HERNIA, UMBILICAL, ADULT
Anesthesia: General | Site: Abdomen

## 2017-10-18 MED ORDER — MIDAZOLAM HCL 2 MG/2ML IJ SOLN
INTRAMUSCULAR | Status: AC
  Filled 2017-10-18: qty 2

## 2017-10-18 MED ORDER — ONDANSETRON HCL 4 MG/2ML IJ SOLN
INTRAMUSCULAR | Status: AC
  Filled 2017-10-18: qty 2

## 2017-10-18 MED ORDER — LACTATED RINGERS IV SOLN
INTRAVENOUS | Status: DC
  Administered 2017-10-18: 13:00:00 via INTRAVENOUS

## 2017-10-18 MED ORDER — ONDANSETRON HCL 4 MG/2ML IJ SOLN
INTRAMUSCULAR | Status: DC | PRN
  Administered 2017-10-18: 4 mg via INTRAVENOUS

## 2017-10-18 MED ORDER — CHLORHEXIDINE GLUCONATE CLOTH 2 % EX PADS: 6.0000 | MEDICATED_PAD | Freq: Once | CUTANEOUS | Status: DC

## 2017-10-18 MED ORDER — FENTANYL CITRATE (PF) 100 MCG/2ML IJ SOLN
INTRAMUSCULAR | Status: DC | PRN
  Administered 2017-10-18 (×2): 100 ug via INTRAVENOUS
  Administered 2017-10-18: 5 ug via INTRAVENOUS

## 2017-10-18 MED ORDER — ROCURONIUM BROMIDE 50 MG/5ML IV SOSY
PREFILLED_SYRINGE | INTRAVENOUS | Status: AC
  Filled 2017-10-18: qty 5

## 2017-10-18 MED ORDER — PROPOFOL 10 MG/ML IV BOLUS
INTRAVENOUS | Status: DC | PRN
  Administered 2017-10-18: 200 mg via INTRAVENOUS

## 2017-10-18 MED ORDER — PROPOFOL 10 MG/ML IV BOLUS
INTRAVENOUS | Status: AC
  Filled 2017-10-18: qty 20

## 2017-10-18 MED ORDER — BUPIVACAINE LIPOSOME 1.3 % IJ SUSP
20.0000 mL | Freq: Once | INTRAMUSCULAR | Status: AC
  Administered 2017-10-18: 20 mL
  Filled 2017-10-18: qty 20

## 2017-10-18 MED ORDER — FENTANYL CITRATE (PF) 250 MCG/5ML IJ SOLN
INTRAMUSCULAR | Status: AC
  Filled 2017-10-18: qty 5

## 2017-10-18 MED ORDER — DEXAMETHASONE SODIUM PHOSPHATE 10 MG/ML IJ SOLN
INTRAMUSCULAR | Status: AC
  Filled 2017-10-18: qty 1

## 2017-10-18 MED ORDER — HYDROMORPHONE HCL 1 MG/ML IJ SOLN
0.2500 mg | INTRAMUSCULAR | Status: DC | PRN
  Administered 2017-10-18 (×2): 0.5 mg via INTRAVENOUS

## 2017-10-18 MED ORDER — DEXAMETHASONE SODIUM PHOSPHATE 10 MG/ML IJ SOLN
INTRAMUSCULAR | Status: DC | PRN
  Administered 2017-10-18: 10 mg via INTRAVENOUS

## 2017-10-18 MED ORDER — SODIUM CHLORIDE 0.9 % IJ SOLN
INTRAMUSCULAR | Status: AC
  Filled 2017-10-18: qty 10

## 2017-10-18 MED ORDER — SODIUM CHLORIDE 0.9% FLUSH: 3.0000 mL | INTRAVENOUS | Status: DC | PRN

## 2017-10-18 MED ORDER — HEPARIN SODIUM (PORCINE) 5000 UNIT/ML IJ SOLN
5000.0000 [IU] | Freq: Once | INTRAMUSCULAR | Status: AC
  Administered 2017-10-18: 5000 [IU] via SUBCUTANEOUS
  Filled 2017-10-18: qty 1

## 2017-10-18 MED ORDER — MIDAZOLAM HCL 5 MG/5ML IJ SOLN
INTRAMUSCULAR | Status: DC | PRN
  Administered 2017-10-18: 2 mg via INTRAVENOUS

## 2017-10-18 MED ORDER — 0.9 % SODIUM CHLORIDE (POUR BTL) OPTIME
TOPICAL | Status: DC | PRN
  Administered 2017-10-18: 1000 mL

## 2017-10-18 MED ORDER — ROCURONIUM BROMIDE 100 MG/10ML IV SOLN
INTRAVENOUS | Status: DC | PRN
  Administered 2017-10-18: 50 mg via INTRAVENOUS

## 2017-10-18 MED ORDER — HYDROMORPHONE HCL 1 MG/ML IJ SOLN
INTRAMUSCULAR | Status: AC
  Filled 2017-10-18: qty 1

## 2017-10-18 MED ORDER — HYDROCODONE-ACETAMINOPHEN 5-325 MG PO TABS
1.0000 | ORAL_TABLET | Freq: Once | ORAL | Status: AC
  Administered 2017-10-18: 1 via ORAL
  Filled 2017-10-18: qty 1

## 2017-10-18 MED ORDER — LIDOCAINE 2% (20 MG/ML) 5 ML SYRINGE
INTRAMUSCULAR | Status: AC
  Filled 2017-10-18: qty 5

## 2017-10-18 MED ORDER — SODIUM CHLORIDE 0.9% FLUSH: 3.0000 mL | Freq: Two times a day (BID) | INTRAVENOUS | Status: DC

## 2017-10-18 MED ORDER — ONDANSETRON HCL 4 MG/2ML IJ SOLN: 4.0000 mg | Freq: Once | INTRAMUSCULAR | Status: DC | PRN

## 2017-10-18 MED ORDER — SUGAMMADEX SODIUM 200 MG/2ML IV SOLN
INTRAVENOUS | Status: DC | PRN
  Administered 2017-10-18: 200 mg via INTRAVENOUS

## 2017-10-18 MED ORDER — HYDROCODONE-ACETAMINOPHEN 5-325 MG PO TABS: 1.0000 | ORAL_TABLET | ORAL | 0 refills | Status: DC | PRN

## 2017-10-18 MED ORDER — SODIUM CHLORIDE 0.9 % IV SOLN: 250.0000 mL | INTRAVENOUS | Status: DC | PRN

## 2017-10-18 MED ORDER — CEFAZOLIN SODIUM-DEXTROSE 2-4 GM/100ML-% IV SOLN
2.0000 g | INTRAVENOUS | Status: AC
  Administered 2017-10-18: 2 g via INTRAVENOUS
  Filled 2017-10-18: qty 100

## 2017-10-18 MED ORDER — SUGAMMADEX SODIUM 200 MG/2ML IV SOLN
INTRAVENOUS | Status: AC
  Filled 2017-10-18: qty 2

## 2017-10-18 MED ORDER — LIDOCAINE HCL (CARDIAC) 20 MG/ML IV SOLN
INTRAVENOUS | Status: DC | PRN
  Administered 2017-10-18: 100 mg via INTRAVENOUS

## 2017-10-18 SURGICAL SUPPLY — 33 items
BENZOIN TINCTURE PRP APPL 2/3 (GAUZE/BANDAGES/DRESSINGS) IMPLANT
BLADE HEX COATED 2.75 (ELECTRODE) ×2 IMPLANT
BLADE SURG 15 STRL LF DISP TIS (BLADE) ×1 IMPLANT
BLADE SURG 15 STRL SS (BLADE) ×1
BLADE SURG SZ10 CARB STEEL (BLADE) ×2 IMPLANT
COVER SURGICAL LIGHT HANDLE (MISCELLANEOUS) ×2 IMPLANT
DECANTER SPIKE VIAL GLASS SM (MISCELLANEOUS) IMPLANT
DRAPE LAPAROTOMY T 102X78X121 (DRAPES) ×2 IMPLANT
ELECT PENCIL ROCKER SW 15FT (MISCELLANEOUS) ×2 IMPLANT
ELECT REM PT RETURN 15FT ADLT (MISCELLANEOUS) ×2 IMPLANT
GAUZE SPONGE 4X4 12PLY STRL (GAUZE/BANDAGES/DRESSINGS) ×2 IMPLANT
GLOVE BIOGEL M 8.0 STRL (GLOVE) ×2 IMPLANT
GLOVE BIOGEL PI IND STRL 7.0 (GLOVE) ×1 IMPLANT
GLOVE BIOGEL PI INDICATOR 7.0 (GLOVE) ×1
GOWN SPEC L4 XLG W/TWL (GOWN DISPOSABLE) ×2 IMPLANT
GOWN STRL REUS W/TWL LRG LVL3 (GOWN DISPOSABLE) ×2 IMPLANT
GOWN STRL REUS W/TWL XL LVL3 (GOWN DISPOSABLE) ×6 IMPLANT
KIT BASIN OR (CUSTOM PROCEDURE TRAY) ×2 IMPLANT
MARKER SKIN DUAL TIP RULER LAB (MISCELLANEOUS) ×2 IMPLANT
NEEDLE HYPO 22GX1.5 SAFETY (NEEDLE) ×2 IMPLANT
NEEDLE HYPO 25X1 1.5 SAFETY (NEEDLE) ×2 IMPLANT
NS IRRIG 1000ML POUR BTL (IV SOLUTION) ×2 IMPLANT
PACK BASIC VI WITH GOWN DISP (CUSTOM PROCEDURE TRAY) ×2 IMPLANT
SCISSORS LAP 5X45 EPIX DISP (ENDOMECHANICALS) ×2 IMPLANT
SPONGE LAP 4X18 X RAY DECT (DISPOSABLE) ×4 IMPLANT
STAPLER VISISTAT 35W (STAPLE) ×2 IMPLANT
STRIP CLOSURE SKIN 1/2X4 (GAUZE/BANDAGES/DRESSINGS) IMPLANT
SUT MNCRL AB 4-0 PS2 18 (SUTURE) ×4 IMPLANT
SUT NOVA NAB DX-16 0-1 5-0 T12 (SUTURE) ×2 IMPLANT
SUT PROLENE 0 CT 1 CR/8 (SUTURE) IMPLANT
SUT PROLENE 0 CT 2 (SUTURE) IMPLANT
SUT VIC AB 4-0 SH 18 (SUTURE) ×2 IMPLANT
SYR CONTROL 10ML LL (SYRINGE) ×2 IMPLANT

## 2017-10-18 NOTE — Anesthesia Procedure Notes (Signed)
Procedure Name: Intubation Date/Time: 10/18/2017 2:25 PM Performed by: Glory Buff Pre-anesthesia Checklist: Patient identified, Emergency Drugs available, Suction available and Patient being monitored Patient Re-evaluated:Patient Re-evaluated prior to induction Oxygen Delivery Method: Circle system utilized Preoxygenation: Pre-oxygenation with 100% oxygen Induction Type: IV induction Ventilation: Mask ventilation without difficulty Laryngoscope Size: Miller and 3 Grade View: Grade I Tube type: Oral Tube size: 7.5 mm Number of attempts: 1 Airway Equipment and Method: Stylet and Oral airway Placement Confirmation: ETT inserted through vocal cords under direct vision,  positive ETCO2 and breath sounds checked- equal and bilateral Secured at: 22 cm Tube secured with: Tape Dental Injury: Teeth and Oropharynx as per pre-operative assessment

## 2017-10-18 NOTE — Interval H&P Note (Signed)
History and Physical Interval Note:  10/18/2017 1:23 PM  Philip Humphrey  has presented today for surgery, with the diagnosis of multiple lipomas and umbilical hernia  The various methods of treatment have been discussed with the patient and family. After consideration of risks, benefits and other options for treatment, the patient has consented to  Procedure(s): Umbilical hernia repair possible mesh and excision of lipomas (N/A) as a surgical intervention .  The patient's history has been reviewed, patient examined, no change in status, stable for surgery.  I have reviewed the patient's chart and labs.  Questions were answered to the patient's satisfaction.     Mai Longnecker B

## 2017-10-18 NOTE — Transfer of Care (Signed)
Immediate Anesthesia Transfer of Care Note  Patient: Philip Humphrey  Procedure(s) Performed: Umbilical hernia repair  mesh and excision of lipomas (N/A Abdomen)  Patient Location: PACU  Anesthesia Type:General  Level of Consciousness: awake, alert  and oriented  Airway & Oxygen Therapy: Patient Spontanous Breathing and Patient connected to face mask oxygen  Post-op Assessment: Report given to RN and Post -op Vital signs reviewed and stable  Post vital signs: Reviewed and stable  Last Vitals:  Vitals:   10/18/17 1240 10/18/17 1537  BP: 123/88   Pulse: 82   Resp: 18   Temp: 37 C (P) 37.1 C  SpO2: 100%     Last Pain:  Vitals:   10/18/17 1257  TempSrc:   PainSc: 0-No pain      Patients Stated Pain Goal: 4 (39/03/00 9233)  Complications: No apparent anesthesia complications

## 2017-10-18 NOTE — Op Note (Signed)
Surgeon: Kaylyn Lim, MD, FACS  Asst:  none  Anes:  general  Preop Dx: Supraumbilical hernia and four symptomatic lipomata Postop Dx: same  Procedure: Laparoscopic assisted umbilical hernia repair and excision of 4 lipomata Location Surgery: OR 2 [facility] Complications: None noted  EBL:   Minimal cc  Drains: None  Description of Procedure:  The patient was taken to OR 2.  After anesthesia was administered and the patient was prepped a timeout was performed.  The patient has a supraumbilical hernia that felt like it was likely fat herniating through a small defect.  This and the lipomata were marked preoperatively with an indelible marker.  The first lipomata was in the left upper quadrant I cut down on it and excised it and then used the defect in certain 5 mm port into the abdomen without difficulty.  I was able to visualize the defect above the umbilicus and it was about the size of an eraser tip and appeared to contain fat.  I went over the patient's right side to another lipoma and cut down on it and placed a trocar through that area.  This enabled me to to evaluate and manipulate the supraumbilical hernia.  At that point I felt it was best to make a transverse incision above the umbilicus and cut down on this and I excised the herniated fat.  This caused some bleeding which I controlled with electrocautery.  The defect was about 5 mm in diameter and I felt was too small for a piece of mesh and so I closed with an inverted piece of #1 Novafil suture tied and this completely closed the defect.  There were 2 other lipomata laterally on the right side we tried again through a transverse incision was excised.  I did send the separately to path although they have all appeared to be benign.  I injected the port sites and the sides with Exparel and closed with 4-0 Monocryl and Dermabond.  Hernia repair looks fine from the inside prior to decompressing the abdomen.  The patient tolerated  the procedure well and was taken to the PACU in stable condition.     Matt B. Hassell Done, [place], [facility] Surgery, [place]

## 2017-10-18 NOTE — Anesthesia Preprocedure Evaluation (Addendum)
Anesthesia Evaluation  Patient identified by MRN, date of birth, ID band Patient awake    Reviewed: Allergy & Precautions, NPO status , Patient's Chart, lab work & pertinent test results  Airway Mallampati: III  TM Distance: >3 FB Neck ROM: Full    Dental no notable dental hx. (+) Caps   Pulmonary former smoker,    Pulmonary exam normal breath sounds clear to auscultation       Cardiovascular hypertension, Pt. on medications Normal cardiovascular exam Rhythm:Regular Rate:Normal  ECG: NSR, rate 86   Neuro/Psych PSYCHIATRIC DISORDERS Anxiety Depression negative neurological ROS     GI/Hepatic Neg liver ROS, GERD  Medicated and Controlled,  Endo/Other  Hypothyroidism   Renal/GU negative Renal ROS     Musculoskeletal  (+) Arthritis , Osteoarthritis,    Abdominal (+) + obese,   Peds  Hematology negative hematology ROS (+)   Anesthesia Other Findings Hyperlipidemia   Reproductive/Obstetrics                             Lab Results  Component Value Date   WBC 7.3 10/15/2017   HGB 14.4 10/15/2017   HCT 41.9 10/15/2017   MCV 95.9 10/15/2017   PLT 188 10/15/2017   Lab Results  Component Value Date   CREATININE 0.93 10/15/2017   BUN 11 10/15/2017   NA 139 10/15/2017   K 3.7 10/15/2017   CL 106 10/15/2017   CO2 25 10/15/2017    Anesthesia Physical  Anesthesia Plan  ASA: III  Anesthesia Plan: General   Post-op Pain Management:    Induction: Intravenous  PONV Risk Score and Plan: 3 and Ondansetron, Dexamethasone, Midazolam and Treatment may vary due to age or medical condition  Airway Management Planned: Oral ETT and LMA  Additional Equipment:   Intra-op Plan:   Post-operative Plan: Extubation in OR  Informed Consent: I have reviewed the patients History and Physical, chart, labs and discussed the procedure including the risks, benefits and alternatives for the proposed  anesthesia with the patient or authorized representative who has indicated his/her understanding and acceptance.   Dental advisory given  Plan Discussed with: CRNA  Anesthesia Plan Comments:         Anesthesia Quick Evaluation

## 2017-10-18 NOTE — Anesthesia Postprocedure Evaluation (Signed)
Anesthesia Post Note  Patient: Philip Humphrey  Procedure(s) Performed: Umbilical hernia repair  and excision of lipomas (N/A Abdomen)     Patient location during evaluation: PACU Anesthesia Type: General Level of consciousness: awake and alert Pain management: pain level controlled Vital Signs Assessment: post-procedure vital signs reviewed and stable Respiratory status: spontaneous breathing, nonlabored ventilation, respiratory function stable and patient connected to nasal cannula oxygen Cardiovascular status: blood pressure returned to baseline and stable Postop Assessment: no apparent nausea or vomiting Anesthetic complications: no    Last Vitals:  Vitals:   10/18/17 1600 10/18/17 1620  BP: 130/74 140/90  Pulse:  100  Resp:  15  Temp: 37.1 C 36.9 C  SpO2:  100%    Last Pain:  Vitals:   10/18/17 1620  TempSrc:   PainSc: 2                  Tiajuana Amass

## 2017-10-18 NOTE — Discharge Instructions (Addendum)
Laparoscopic Umbilical Hernia Repair, Care After This sheet gives you information about how to care for yourself after your procedure. Your health care provider may also give you more specific instructions. If you have problems or questions, contact your health care provider. What can I expect after the procedure? After the procedure, it is common to have:  Pain, discomfort, or soreness.  Follow these instructions at home: Incision care  Follow instructions from your health care provider about how to take care of your incision. Make sure you: ? Wash your hands with soap and water before you change your bandage (dressing) or before you touch your abdomen. If soap and water are not available, use hand sanitizer. ? Change your dressing as told by your health care provider. ? Leave stitches (sutures), skin glue, or adhesive strips in place. These skin closures may need to stay in place for 2 weeks or longer. If adhesive strip edges start to loosen and curl up, you may trim the loose edges. Do not remove adhesive strips completely unless your health care provider tells you to do that.  Check your incision area every day for signs of infection. Check for: ? Redness, swelling, or pain. ? Fluid or blood. ? Warmth. ? Pus or a bad smell. Bathing  Do not take baths, swim, or use a hot tub until your health care provider approves. Ask your health care provider if you can take showers. You may only be allowed to take sponge baths for bathing.  Keep your bandage (dressing) dry until your health care provider says it can be removed. Activity  Do not lift anything that is heavier than 10 lb (4.5 kg) until your health care provider approves.  Do not drive or use heavy machinery while taking prescription pain medicine. Ask your health care provider when it is safe for you to drive or use heavy machinery.  Do not drive for 24 hours if you were given a medicine to help you relax (sedative) during your  procedure.  Rest as told by your health care provider. You may return to your normal activities when your health care provider approves. General instructions  Take over-the-counter and prescription medicines only as told by your health care provider.  To prevent or treat constipation while you are taking prescription pain medicine, your health care provider may recommend that you: ? Take over-the-counter or prescription medicines. ? Eat foods that are high in fiber, such as fresh fruits and vegetables, whole grains, and beans. ? Limit foods that are high in fat and processed sugars, such as fried and sweet foods.  Drink enough fluid to keep your urine clear or pale yellow.  Hold a pillow over your abdomen when you cough or sneeze. This helps with pain.  Keep all follow-up visits as told by your health care provider. This is important. Contact a health care provider if:  You have: ? A fever or chills. ? Redness, swelling, or pain around your incision. ? Fluid or blood coming from your incision. ? Pus or a bad smell coming from your incision. ? Pain that gets worse or does not get better with medicine. ? Nausea or vomiting. ? A cough. ? Shortness of breath.  Your incision feels warm to the touch.  You have not had a bowel movement in three days.  You are not able to urinate. Get help right away if:  You have severe pain in your abdomen.  You have persistent nausea and vomiting.  You have redness,  warmth, or pain in your leg.  You have chest pain.  You have trouble breathing. Summary  After this procedure, it is common to have pain, discomfort, or soreness.  Follow instructions from your health care provider about how to take care of your incision.  Check your incision area every day for signs of infection. Report any signs of infection to your health care provider.  Keep all follow-up visits as told by your health care provider. This is important. This information  is not intended to replace advice given to you by your health care provider. Make sure you discuss any questions you have with your health care provider. Document Released: 11/23/2012 Document Revised: 07/29/2016 Document Reviewed: 07/29/2016 Elsevier Interactive Patient Education  2018 [employer].

## 2017-10-19 ENCOUNTER — Encounter (HOSPITAL_COMMUNITY): Payer: Self-pay | Admitting: Surgery

## 2017-12-02 ENCOUNTER — Encounter: Payer: Self-pay | Admitting: Physical Medicine & Rehabilitation

## 2017-12-28 ENCOUNTER — Other Ambulatory Visit: Payer: Self-pay

## 2017-12-28 ENCOUNTER — Ambulatory Visit (HOSPITAL_BASED_OUTPATIENT_CLINIC_OR_DEPARTMENT_OTHER): Payer: 59 | Admitting: Physical Medicine & Rehabilitation

## 2017-12-28 ENCOUNTER — Encounter: Payer: Self-pay | Admitting: Physical Medicine & Rehabilitation

## 2017-12-28 ENCOUNTER — Encounter: Payer: 59 | Attending: Physical Medicine & Rehabilitation

## 2017-12-28 VITALS — BP 122/87 | HR 102

## 2017-12-28 DIAGNOSIS — G8929 Other chronic pain: Secondary | ICD-10-CM | POA: Diagnosis not present

## 2017-12-28 DIAGNOSIS — Z9889 Other specified postprocedural states: Secondary | ICD-10-CM | POA: Diagnosis not present

## 2017-12-28 DIAGNOSIS — M25552 Pain in left hip: Secondary | ICD-10-CM | POA: Insufficient documentation

## 2017-12-28 DIAGNOSIS — Z79899 Other long term (current) drug therapy: Secondary | ICD-10-CM | POA: Diagnosis not present

## 2017-12-28 DIAGNOSIS — M25551 Pain in right hip: Secondary | ICD-10-CM | POA: Insufficient documentation

## 2017-12-28 DIAGNOSIS — Z96643 Presence of artificial hip joint, bilateral: Secondary | ICD-10-CM | POA: Insufficient documentation

## 2017-12-28 DIAGNOSIS — Z87891 Personal history of nicotine dependence: Secondary | ICD-10-CM | POA: Diagnosis not present

## 2017-12-28 DIAGNOSIS — Z801 Family history of malignant neoplasm of trachea, bronchus and lung: Secondary | ICD-10-CM | POA: Insufficient documentation

## 2017-12-28 DIAGNOSIS — M25512 Pain in left shoulder: Secondary | ICD-10-CM | POA: Diagnosis present

## 2017-12-28 DIAGNOSIS — M25561 Pain in right knee: Secondary | ICD-10-CM

## 2017-12-28 DIAGNOSIS — Z8249 Family history of ischemic heart disease and other diseases of the circulatory system: Secondary | ICD-10-CM | POA: Insufficient documentation

## 2017-12-28 DIAGNOSIS — M1990 Unspecified osteoarthritis, unspecified site: Secondary | ICD-10-CM | POA: Insufficient documentation

## 2017-12-28 DIAGNOSIS — M25562 Pain in left knee: Secondary | ICD-10-CM

## 2017-12-28 DIAGNOSIS — Z79890 Hormone replacement therapy (postmenopausal): Secondary | ICD-10-CM | POA: Diagnosis not present

## 2017-12-28 DIAGNOSIS — Z823 Family history of stroke: Secondary | ICD-10-CM | POA: Diagnosis not present

## 2017-12-28 DIAGNOSIS — M25511 Pain in right shoulder: Secondary | ICD-10-CM | POA: Diagnosis not present

## 2017-12-28 DIAGNOSIS — Z836 Family history of other diseases of the respiratory system: Secondary | ICD-10-CM | POA: Insufficient documentation

## 2017-12-28 DIAGNOSIS — E890 Postprocedural hypothyroidism: Secondary | ICD-10-CM | POA: Insufficient documentation

## 2017-12-28 DIAGNOSIS — G629 Polyneuropathy, unspecified: Secondary | ICD-10-CM | POA: Insufficient documentation

## 2017-12-28 NOTE — Patient Instructions (Signed)
We can do genicular nerve blocks and if helpful but short lived can do radiofrequency neurotomy  We can also do suprascapular nerve block for shoulder pain and freeze the nerve if only a short term relief  PT will be the mainstay of treatment need to strengthen knee

## 2017-12-28 NOTE — Progress Notes (Signed)
Subjective:    Patient ID: Philip Humphrey, male    DOB: 07-Jul-1961, 57 y.o.   MRN: [id number]  HPI   Chief complaint: Pain at multiple areas including both shoulders both knees left wrist right hand.  57 year old male with a several year history of pain that has affected several joints.  He had previously worked as a Clinical cytogeneticist for [employer] and has been on disability for greater than 2 years. His current level functioning is independent for all self-care and mobility also drives.  He occasionally uses a cane.  He can no longer do chores around the house like he used to.  He does assist in the care of his wife who has lupus and currently has pneumonia. The patient states he cannot tell which of his complaints is the worst.  They seem to alternate day today.  He has been evaluated by his orthopedic surgeons Dr. Alma Friendly, Apolonio Schneiders and [place] and has been told that his shoulder and knee and hip pain is soft tissue related. 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, pain with reaching behind back Left shoulder surgery 2007 Dr Veverly Fells , doing ok compared to the right side Hx Left wrist fracure ORIF Left wrist Dr Apolonio Schneiders 2008, repeat fracture    Takes Gabapentin for neuropathy not a diabetic Left knee MRI 06/09/2017-Tibiofemoral chondral softening, patellar chondral softening no significant meniscal problems or ligamentous tears  Hydrocodone 5mg  takes ~2 tabs per month Takes tylenol pm  No longer takes Ibuprofen due to ulcers Tramadol 50mg  1 per day Uses Voltaren cream    Past medical history 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 General activity 10 Relation with others 10 Enjoyment of life 10 What TIME of day is your pain at its worst? all Sleep (in general) Poor  Pain is worse with: walking, bending, sitting, inactivity and standing Pain improves with: rest, heat/ice, therapy/exercise, pacing activities, medication and injections Relief from Meds: 3  Mobility walk without assistance walk with assistance use a cane how many minutes can you walk? 15 ability to climb steps?  no do you drive?  yes Do you have any goals in this area?  yes  Function employed # of hrs/week 0 disabled: date disabled . I need assistance with the following:  dressing, bathing, meal prep, household duties and shopping Do you have any goals in this area?  yes  Neuro/Psych bowel control problems weakness numbness tremor tingling trouble walking spasms depression anxiety  Prior Studies Any changes since last visit?  no  Physicians involved in your care Any changes since last visit?  no   Family History  Problem Relation Age of Onset  . Cancer Mother        Lung  . Heart disease Mother   . Cancer Brother        Lung  . Lung disease Brother   . Stroke Father    Social History   Socioeconomic History  . Marital status: Married    Spouse name: None  . Number of children:  None  . Years of education: None  . Highest education level: None  Social Needs  . Financial resource strain: None  . Food insecurity - worry: None  . Food insecurity - inability: None  . Transportation needs - medical: None  . Transportation needs - non-medical: None  Occupational History  . None  Tobacco Use  . Smoking status: Former Smoker    Types: Cigars  . Smokeless tobacco: Never Used  . Tobacco comment: occasional quit 2012  Substance and Sexual Activity  . Alcohol use: Yes    Comment: OCCASIONALLY BEER  . Drug use: No  . Sexual activity: Yes  Other Topics  Concern  . None  Social History Narrative  . None   Past Surgical History:  Procedure Laterality Date  . COLONOSCOPY    . COLONOSCOPY N/A 02/15/2015   Procedure: COLONOSCOPY;  Surgeon: Alphonsa Overall, MD;  Location: [facility];  Service: General;  Laterality: N/A;  . cyst removed     from neck / abdomed  . EXAMINATION UNDER ANESTHESIA N/A 02/15/2015   Procedure: EXAM UNDER ANESTHESIA;  Surgeon: Pedro Earls, MD;  Location: [facility];  Service: General;  Laterality: N/A;  . HERNIA REPAIR Left    10 yrs ago  . KNEE ARTHROSCOPY Left    x1 and meniscal tear   . ROTATOR CUFF REPAIR Right    x1  . SHOULDER ARTHROSCOPY W/ ROTATOR CUFF REPAIR Left   . THYROIDECTOMY  age 85   . TOTAL HIP ARTHROPLASTY Left 11/24/2016   Procedure: LEFT TOTAL HIP ARTHROPLASTY ANTERIOR APPROACH;  Surgeon: Paralee Cancel, MD;  Location: [facility];  Service: Orthopedics;  Laterality: Left;  . TOTAL HIP ARTHROPLASTY     Right hip  Dr. Alvan Dame 08/03/17  . TOTAL HIP ARTHROPLASTY Right 08/03/2017   Procedure: RIGHT TOTAL HIP ARTHROPLASTY ANTERIOR APPROACH;  Surgeon: Paralee Cancel, MD;  Location: [facility];  Service: Orthopedics;  Laterality: Right;  70 mins  . UMBILICAL HERNIA REPAIR N/A 10/18/2017   Procedure: Umbilical hernia repair  and excision of lipomas;  Surgeon: Johnathan Hausen, MD;  Location: [facility];  Service: General;  Laterality: N/A;  . WART FULGURATION N/A 02/15/2015   Procedure: REMOVAL OF ANAL TAGS,CONDYLOMA;  Surgeon: Pedro Earls, MD;  Location: [facility];  Service: General;  Laterality: N/A;  . WRIST SURGERY     x2- left (repair tendon/ ligament)   Past Medical History:  Diagnosis Date  . Allergy    occ. seasonal  . Anxiety   . Arthritis   . Colon polyp   . Condyloma    anal  . Depression   . GERD (gastroesophageal reflux disease)   . History of transfusion    as child  . Hypertension   . Knee injury    right knee cap w piece broken off- MRI done 02-06-15-pending surgery. 11-18-16 right knee is still  painful, both shoulders(limited ROM right).  . Knee pain   . Neuropathy    bilateral- greater left  . Palpitations    normal stress test 10 yrs ago  . Pneumonia   . Thyroid disease    Thyroid removed unintentionally at age 51- no further thyroid tissue remains-uses daily supplement   There were no vitals taken for this visit.  Opioid Risk Score:  1 Fall Risk Score:  `1  Depression screen PHQ 2/9  Depression screen [facility] 2/9 12/28/2017 12/28/2017  Decreased Interest 3 3  Down, Depressed, Hopeless 3 3  PHQ - 2 Score 6 6  Altered sleeping 3 -  Tired, decreased energy 3 -  Change in appetite 2 -  Feeling bad or failure about yourself  3 -  Trouble concentrating 3 -  Moving slowly or fidgety/restless 2 -  Suicidal thoughts 0 -  PHQ-9 Score 22 -  Difficult doing work/chores Extremely dIfficult -      Review of Systems  Constitutional: Negative.   HENT: Negative.   Eyes: Negative.   Respiratory: Negative.   Cardiovascular: Negative.   Gastrointestinal: Negative.   Endocrine: Negative.   Genitourinary: Negative.   Musculoskeletal: Negative.   Skin: Negative.   Allergic/Immunologic: Negative.   Neurological: Negative.   Hematological: Negative.   Psychiatric/Behavioral: Negative.        Objective:   Physical Exam  Constitutional: He is oriented to person, place, and time. He appears well-developed and well-nourished. No distress.  HENT:  Head: Normocephalic and atraumatic.  Eyes: Conjunctivae and EOM are normal. Pupils are equal, round, and reactive to light. No scleral icterus.  Neck: Normal range of motion. Neck supple.  Cardiovascular: Normal rate, regular rhythm and normal heart sounds.  No murmur heard. Pulmonary/Chest: Effort normal and breath sounds normal. No respiratory distress. He has no wheezes.  Abdominal: Soft. Bowel sounds are normal. He exhibits no distension. There is no tenderness.  Musculoskeletal: He exhibits tenderness. He exhibits no edema.    Arthritic changes right hand DIP PIP, decreased MCH P flexion index finger Right shoulder has decreased internal and external rotation, full range of motion with abduction.  Has full elbow flexion and extension, full wrist flexion extension, left shoulder has limited abduction and forward flexion to approximately 100 degrees, he does have internal and external rotation intact, there is limited wrist flexion and extension, left elbow range of motion is full finger range of motion is full Patient has mildly diminished left hip internal/external rotation as well as right hip internal/external rotation.  Knee flexion and extension are full bilaterally.  Ankle flexion extension are normal bilaterally Motor strength testing 3- bilateral deltoid limited by pain bicep triceps are 5/5 finger flexion extension are 5/5 Lower extremity muscle strength testing difficult to perform secondary to give way weakness.  He does have at least 4- strength bilateral hip flexor knee extensor ankle dorsiflexor.  His gait is without evidence of toe drag or knee instability.  He does not use an assistive device. Neck has full range of motion negative Spurling's Lumbar he does have reduced lumbar range of motion  Neurological: He is alert and oriented to person, place, and time.  Skin: He is not diaphoretic.  Psychiatric: He has a normal mood and affect. His behavior is normal. Judgment and thought content normal.  Nursing note and vitals reviewed.   Right shoulder decreased ext rotation      Assessment & Plan:  #1.  Chronic multifactorial pain he has postoperative pain left shoulder as well as both hips. His knee pain may be related to chondral degeneration but his knee examination is otherwise negative.   We discussed that the primary goal is to increase mobility and functional activities.  We discussed that only physical therapy and strengthening can accomplish that and there is no pillar injection that can do  that. We discussed interventional pain techniques that may help at least improve therapy tolerance given that he gives a history that he could not tolerate therapy very well in the past. We discussed genicular nerve blocks of the knees as well as suprascapular nerve block of the shoulder.  At this point he states that he would like to try the therapy first and he would like to do that at the orthopedic office.  We discussed that I cannot order PT at the orthopedic office that 1 of the orthopedist would have to do that for him. He may actually do better with a more chronic pain approach such as at integrative therapies.  Patient prefers to go back to orthopedic office for this however. We discussed that we can prescribe tramadol however would need to do urine drug screen and pain contract.  He states that he does not want to pursue this. Follow-up physical medicine and rehabilitation 4 weeks.

## 2020-12-16 ENCOUNTER — Telehealth: Payer: Self-pay | Admitting: Hematology and Oncology

## 2020-12-16 NOTE — Telephone Encounter (Signed)
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 Philip Humphrey has been cld and scheduled to see Dr. Chryl Heck on 1/14 at 11am. Pt aware to arrive 30 minutes early.

## 2021-01-03 ENCOUNTER — Inpatient Hospital Stay: Payer: 59

## 2021-01-03 ENCOUNTER — Inpatient Hospital Stay: Payer: 59 | Attending: Hematology and Oncology | Admitting: Hematology and Oncology

## 2021-01-03 NOTE — Progress Notes (Incomplete)
[facility] NOTE  Patient Care Team: Kristie Cowman, MD as PCP - General (Family Medicine)  CHIEF COMPLAINTS/PURPOSE OF CONSULTATION:  ***  ASSESSMENT & PLAN:  No problem-specific Assessment & Plan notes found for this encounter.  No orders of the defined types were placed in this encounter.    HISTORY OF PRESENTING ILLNESS:   Philip Humphrey 60 y.o. 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  REVIEW OF SYSTEMS:   Constitutional: Denies fevers, chills or abnormal night sweats Eyes: Denies blurriness of vision, double vision or watery eyes Ears, nose, mouth, throat, and face: Denies mucositis or sore throat Respiratory: Denies cough, dyspnea or wheezes Cardiovascular: Denies palpitation, chest discomfort or lower extremity swelling Gastrointestinal:  Denies nausea, heartburn or change in bowel habits Skin: Denies abnormal skin rashes Lymphatics: Denies new lymphadenopathy or easy bruising Neurological:Denies numbness, tingling or new weaknesses Behavioral/Psych: Mood is stable, no new changes  All other systems were reviewed with the patient and are negative.  MEDICAL HISTORY:  Past Medical History:  Diagnosis Date  . Allergy    occ. seasonal  . Anxiety   . Arthritis   . Colon polyp   . Condyloma    anal  . Depression   . GERD (gastroesophageal reflux disease)   . History of transfusion    as child  . Hypertension   . Knee injury    right knee cap w piece broken off- MRI done 02-06-15-pending surgery. 11-18-16 right knee is still painful, both shoulders(limited ROM right).  . Knee pain   . Neuropathy    bilateral- greater left  . Palpitations    normal stress test 10 yrs ago  . Pneumonia   . Thyroid disease    Thyroid removed unintentionally at age 50- no further thyroid tissue remains-uses daily supplement    SURGICAL HISTORY: Past Surgical History:  Procedure Laterality Date  . COLONOSCOPY    . COLONOSCOPY N/A 02/15/2015    Procedure: COLONOSCOPY;  Surgeon: Alphonsa Overall, MD;  Location: [facility];  Service: General;  Laterality: N/A;  . cyst removed     from neck / abdomed  . EXAMINATION UNDER ANESTHESIA N/A 02/15/2015   Procedure: EXAM UNDER ANESTHESIA;  Surgeon: Pedro Earls, MD;  Location: [facility];  Service: General;  Laterality: N/A;  . HERNIA REPAIR Left    10 yrs ago  . KNEE ARTHROSCOPY Left    x1 and meniscal tear   . ROTATOR CUFF REPAIR Right    x1  . SHOULDER ARTHROSCOPY W/ ROTATOR CUFF REPAIR Left   . THYROIDECTOMY  age 18   . TOTAL HIP ARTHROPLASTY Left 11/24/2016   Procedure: LEFT TOTAL HIP ARTHROPLASTY ANTERIOR APPROACH;  Surgeon: Paralee Cancel, MD;  Location: [facility];  Service: Orthopedics;  Laterality: Left;  . TOTAL HIP ARTHROPLASTY     Right hip  Dr. Alvan Dame 08/03/17  . TOTAL HIP ARTHROPLASTY Right 08/03/2017   Procedure: RIGHT TOTAL HIP ARTHROPLASTY ANTERIOR APPROACH;  Surgeon: Paralee Cancel, MD;  Location: [facility];  Service: Orthopedics;  Laterality: Right;  70 mins  . UMBILICAL HERNIA REPAIR N/A 10/18/2017   Procedure: Umbilical hernia repair  and excision of lipomas;  Surgeon: Johnathan Hausen, MD;  Location: [facility];  Service: General;  Laterality: N/A;  . WART FULGURATION N/A 02/15/2015   Procedure: REMOVAL OF ANAL TAGS,CONDYLOMA;  Surgeon: Pedro Earls, MD;  Location: [facility];  Service: General;  Laterality: N/A;  . WRIST SURGERY     x2- left (repair tendon/ ligament)  SOCIAL HISTORY: Social History   Socioeconomic History  . Marital status: Married    Spouse name: Not on file  . Number of children: Not on file  . Years of education: Not on file  . Highest education level: Not on file  Occupational History  . Not on file  Tobacco Use  . Smoking status: Former Smoker    Types: Cigars  . Smokeless tobacco: Never Used  . Tobacco comment: occasional quit 2012  Vaping Use  . Vaping Use: Never used  Substance and Sexual Activity  . Alcohol use: Yes    Comment: OCCASIONALLY BEER  .  Drug use: No  . Sexual activity: Yes  Other Topics Concern  . Not on file  Social History Narrative  . Not on file   Social Determinants of Health   Financial Resource Strain: Not on file  Food Insecurity: Not on file  Transportation Needs: Not on file  Physical Activity: Not on file  Stress: Not on file  Social Connections: Not on file  Intimate Partner Violence: Not on file    FAMILY HISTORY: Family History  Problem Relation Age of Onset  . Cancer Mother        Lung  . Heart disease Mother   . Cancer Brother        Lung  . Lung disease Brother   . Stroke Father     ALLERGIES:  is allergic to sulfa antibiotics and oxycodone.  MEDICATIONS:  Current Outpatient Medications  Medication Sig Dispense Refill  . aspirin 81 MG chewable tablet Chew 1 tablet (81 mg total) by mouth 2 (two) times daily. (Patient not taking: Reported on 10/12/2017) 60 tablet 0  . atorvastatin (LIPITOR) 80 MG tablet Take 80 mg by mouth daily.    . calcium carbonate (TUMS - DOSED IN MG ELEMENTAL CALCIUM) 500 MG chewable tablet Chew 2 tablets by mouth 2 (two) times daily as needed for indigestion or heartburn.     . diphenhydramine-acetaminophen (TYLENOL PM) 25-500 MG TABS tablet Take 2 tablets by mouth at bedtime.     . docusate sodium (COLACE) 100 MG capsule Take 1 capsule (100 mg total) by mouth 2 (two) times daily. (Patient not taking: Reported on 10/12/2017) 10 capsule 0  . ferrous sulfate 325 (65 FE) MG tablet Take 1 tablet (325 mg total) by mouth 2 (two) times daily with a meal. (Patient not taking: Reported on 10/12/2017) 30 tablet 0  . gabapentin (NEURONTIN) 300 MG capsule Take 300-600 mg by mouth 2 (two) times daily. Take 1 capsule (300 mg) in the morning & 2 capsules (600 mg) at night.    Marland Kitchen HYDROcodone-acetaminophen (NORCO) 5-325 MG tablet Take 1 tablet by mouth every 4 (four) hours as needed for moderate pain. 30 tablet 0  . HYDROcodone-acetaminophen (NORCO) 7.5-325 MG tablet Take 1-2 tablets  by mouth every 4 (four) hours as needed (breakthrough pain). (Patient not taking: Reported on 12/28/2017) 60 tablet 0  . lisinopril (PRINIVIL,ZESTRIL) 20 MG tablet Take 20 mg by mouth daily.    . methocarbamol (ROBAXIN) 500 MG tablet Take 1 tablet (500 mg total) by mouth every 6 (six) hours as needed for muscle spasms. (Patient not taking: Reported on 10/12/2017) 60 tablet 0  . omeprazole (PRILOSEC) 40 MG capsule Take 1 capsule (40 mg total) by mouth daily. PATIENT NEEDS OFFICE VISIT FOR ADDITIONAL REFILLS 30 capsule 0  . sertraline (ZOLOFT) 25 MG tablet Take 25 mg by mouth daily as needed (ANXIETY).     Marland Kitchen  SYNTHROID 175 MCG tablet Take 1 tablet (175 mcg total) by mouth daily. NO MORE REFILLS WITHOUT OFFICE VISIT - 2ND NOTICE (Patient taking differently: Take 175 mcg by mouth daily before breakfast. NO MORE REFILLS WITHOUT OFFICE VISIT - 2ND NOTICE) 15 tablet 0  . traMADol (ULTRAM) 50 MG tablet Take 50 mg by mouth 3 (three) times daily. for pain  0   No current facility-administered medications for this visit.     PHYSICAL EXAMINATION: ECOG PERFORMANCE STATUS: {CHL ONC ECOG PS:[phone]}  There were no vitals filed for this visit. There were no vitals filed for this visit.  GENERAL:alert, no distress and comfortable SKIN: skin color, texture, turgor are normal, no rashes or significant lesions EYES: normal, conjunctiva are pink and non-injected, sclera clear OROPHARYNX:no exudate, no erythema and lips, buccal mucosa, and tongue normal  NECK: supple, thyroid normal size, non-tender, without nodularity LYMPH:  no palpable lymphadenopathy in the cervical, axillary or inguinal LUNGS: clear to auscultation and percussion with normal breathing effort HEART: regular rate & rhythm and no murmurs and no lower extremity edema ABDOMEN:abdomen soft, non-tender and normal bowel sounds Musculoskeletal:no cyanosis of digits and no clubbing  PSYCH: alert & oriented x 3 with fluent speech NEURO: no focal  motor/sensory deficits  LABORATORY DATA:  I have reviewed the data as listed Lab Results  Component Value Date   WBC 7.3 10/15/2017   HGB 14.4 10/15/2017   HCT 41.9 10/15/2017   MCV 95.9 10/15/2017   PLT 188 10/15/2017     Chemistry      Component Value Date/Time   NA 139 10/15/2017 1415   K 3.7 10/15/2017 1415   CL 106 10/15/2017 1415   CO2 25 10/15/2017 1415   BUN 11 10/15/2017 1415   CREATININE 0.93 10/15/2017 1415   CREATININE 1.22 07/13/2013 1253      Component Value Date/Time   CALCIUM 8.9 10/15/2017 1415   ALKPHOS 44 07/13/2013 1253   AST 29 07/13/2013 1253   ALT 28 07/13/2013 1253   BILITOT 0.5 07/13/2013 1253       RADIOGRAPHIC STUDIES: I have personally reviewed the radiological images as listed and agreed with the findings in the report. No results found.  All questions were answered. The patient knows to call the clinic with any problems, questions or concerns. I spent *** minutes in the care of this patient including H and P, review of records, counseling and coordination of care.     Benay Pike, MD 01/03/2021 10:29 AM

## 2021-01-15 ENCOUNTER — Telehealth: Payer: Self-pay | Admitting: Hematology and Oncology

## 2021-01-15 NOTE — Telephone Encounter (Signed)
Philip Humphrey cld to reschedule his missed appt w/Dr. Chryl Heck to 2/10 at 1120am. Pt awar to arrive 20 minutes early.

## 2021-01-29 ENCOUNTER — Inpatient Hospital Stay: Payer: 59 | Attending: Hematology and Oncology | Admitting: Hematology and Oncology

## 2021-01-29 ENCOUNTER — Other Ambulatory Visit: Payer: Self-pay

## 2021-01-29 ENCOUNTER — Encounter: Payer: Self-pay | Admitting: Hematology and Oncology

## 2021-01-29 ENCOUNTER — Inpatient Hospital Stay: Payer: 59

## 2021-01-29 DIAGNOSIS — Z87891 Personal history of nicotine dependence: Secondary | ICD-10-CM | POA: Insufficient documentation

## 2021-01-29 DIAGNOSIS — D72829 Elevated white blood cell count, unspecified: Secondary | ICD-10-CM | POA: Insufficient documentation

## 2021-01-29 DIAGNOSIS — K760 Fatty (change of) liver, not elsewhere classified: Secondary | ICD-10-CM | POA: Insufficient documentation

## 2021-01-29 DIAGNOSIS — G629 Polyneuropathy, unspecified: Secondary | ICD-10-CM | POA: Diagnosis not present

## 2021-01-29 DIAGNOSIS — R7989 Other specified abnormal findings of blood chemistry: Secondary | ICD-10-CM | POA: Insufficient documentation

## 2021-01-29 DIAGNOSIS — D7589 Other specified diseases of blood and blood-forming organs: Secondary | ICD-10-CM | POA: Insufficient documentation

## 2021-01-29 DIAGNOSIS — I10 Essential (primary) hypertension: Secondary | ICD-10-CM | POA: Insufficient documentation

## 2021-01-29 DIAGNOSIS — R296 Repeated falls: Secondary | ICD-10-CM | POA: Diagnosis not present

## 2021-01-29 DIAGNOSIS — E079 Disorder of thyroid, unspecified: Secondary | ICD-10-CM | POA: Insufficient documentation

## 2021-01-29 DIAGNOSIS — E538 Deficiency of other specified B group vitamins: Secondary | ICD-10-CM | POA: Insufficient documentation

## 2021-01-29 LAB — FERRITIN: Ferritin: 84 ng/mL (ref 24–336)

## 2021-01-29 LAB — CBC WITH DIFFERENTIAL/PLATELET
Abs Immature Granulocytes: 0.15 10*3/uL — ABNORMAL HIGH (ref 0.00–0.07)
Basophils Absolute: 0 10*3/uL (ref 0.0–0.1)
Basophils Relative: 0 %
Eosinophils Absolute: 0 10*3/uL (ref 0.0–0.5)
Eosinophils Relative: 0 %
HCT: 42 % (ref 39.0–52.0)
Hemoglobin: 14.5 g/dL (ref 13.0–17.0)
Immature Granulocytes: 1 %
Lymphocytes Relative: 6 %
Lymphs Abs: 1 10*3/uL (ref 0.7–4.0)
MCH: 40.2 pg — ABNORMAL HIGH (ref 26.0–34.0)
MCHC: 34.5 g/dL (ref 30.0–36.0)
MCV: 116.3 fL — ABNORMAL HIGH (ref 80.0–100.0)
Monocytes Absolute: 1.2 10*3/uL — ABNORMAL HIGH (ref 0.1–1.0)
Monocytes Relative: 7 %
Neutro Abs: 14.7 10*3/uL — ABNORMAL HIGH (ref 1.7–7.7)
Neutrophils Relative %: 86 %
Platelets: 175 10*3/uL (ref 150–400)
RBC: 3.61 MIL/uL — ABNORMAL LOW (ref 4.22–5.81)
RDW: 15 % (ref 11.5–15.5)
WBC: 17.1 10*3/uL — ABNORMAL HIGH (ref 4.0–10.5)
nRBC: 0 % (ref 0.0–0.2)

## 2021-01-29 LAB — IRON AND TIBC
Iron: 103 ug/dL (ref 42–163)
Saturation Ratios: 28 % (ref 20–55)
TIBC: 371 ug/dL (ref 202–409)
UIBC: 268 ug/dL (ref 117–376)

## 2021-01-29 LAB — CMP (CANCER CENTER ONLY)
ALT: 39 U/L (ref 0–44)
AST: 21 U/L (ref 15–41)
Albumin: 3.7 g/dL (ref 3.5–5.0)
Alkaline Phosphatase: 54 U/L (ref 38–126)
Anion gap: 6 (ref 5–15)
BUN: 15 mg/dL (ref 6–20)
CO2: 30 mmol/L (ref 22–32)
Calcium: 9.3 mg/dL (ref 8.9–10.3)
Chloride: 101 mmol/L (ref 98–111)
Creatinine: 0.86 mg/dL (ref 0.61–1.24)
GFR, Estimated: >60 mL/min (ref >60)
Glucose, Bld: 96 mg/dL (ref 70–99)
Potassium: 4.7 mmol/L (ref 3.5–5.1)
Sodium: 137 mmol/L (ref 135–145)
Total Bilirubin: 0.4 mg/dL (ref 0.3–1.2)
Total Protein: 6.7 g/dL (ref 6.5–8.1)

## 2021-01-29 LAB — VITAMIN B12: Vitamin B-12: 86 pg/mL — ABNORMAL LOW (ref 180–914)

## 2021-01-29 LAB — FOLATE: Folate: 4.1 ng/mL — ABNORMAL LOW (ref >5.9)

## 2021-01-29 NOTE — Progress Notes (Signed)
[facility] NOTE  Patient Care Team: Kristie Cowman, MD as PCP - General (Family Medicine)  CHIEF COMPLAINTS/PURPOSE OF CONSULTATION:  Abnormal labs  ASSESSMENT & PLAN:  No problem-specific Assessment & Plan notes found for this encounter.  No orders of the defined types were placed in this encounter.  This is a pleasant 60 year old male patient with past medical history significant for hypertension, musculoskeletal issues, thyroid disease referred to hematology for evaluation of possible iron overload disorder  Patient denies any new complaints except for recurrent falls which she attributes it to his neuropathy.  He is not certain of his neuropathy etiology but he has had a longstanding neuropathy for multiple years.  During his evaluation, he was found to have some abnormal liver function tests and ferritin ranging from 200-300 I am not entirely sure why hemochromatosis was suspected in the first place He does have established diagnosis of hepatic steatosis which could very well cause elevated LFT's. He is agreeable to repeat labs and hemochromatosis evaluation. Labs today so far not suspicious of hemochromatosis. Leukocytosis likely from steroid use, predominantly neutrophilia. Will review on repeat labs.  Severe macrocytosis noted, looks chronic, low folic acid levels noted on labs collected from 08/21/2020. Also notes suggest Vit B12 deficiency. Will inquire if he is taking this, called him, left a voicemail to call us back.  He was encouraged to FU in 6 weeks approximately. He expressed understanding.  HISTORY OF PRESENTING ILLNESS:   Philip Humphrey 60 y.o. male is here because of ? Hemochromatosis.  This is a very pleasant 60 year old male patient with past medical history significant for hypertension, musculoskeletal issues, peripheral neuropathy who was referred to hematology for evaluation of possible iron overload disorder.  Patient tells me that he  was donating blood up regularly until a couple years ago but he does not go off unknown iron overload disorder that runs in the family.  To see his doctor who ran some labs and wondered if he has hemochromatosis.  He is a never smoker, rarely drinks alcohol does not have any European ancestry and does not have any family members who have required phlebotomies on a regular basis.  He denies any changes in breathing, bowel habits or urinary habits.  He tends to fall a lot and he attributes it mostly because of his neuropathy and occasionally has musculoskeletal issues in his legs give away.  He otherwise has had multiple musculoskeletalissues thyroid disease, had multiple surgeries. He was taking iron supplementation in the past but he has quit taking this.  Rest of the pertinent 10 point ROS reviewed and unremarkable.  REVIEW OF SYSTEMS:    Constitutional: Denies fevers, chills or abnormal night sweats Eyes: Denies blurriness of vision, double vision or watery eyes Ears, nose, mouth, throat, and face: Denies mucositis or sore throat Respiratory: Denies cough, dyspnea or wheezes Cardiovascular: Denies palpitation, chest discomfort or lower extremity swelling Gastrointestinal:  Denies nausea, heartburn or change in bowel habits Skin: Denies abnormal skin rashes Lymphatics: Denies new lymphadenopathy or easy bruising Neurological:Denies numbness, tingling or new weaknesses Behavioral/Psych: Mood is stable, no new changes  All other systems were reviewed with the patient and are negative.  MEDICAL HISTORY:  Past Medical History:  Diagnosis Date  . Allergy    occ. seasonal  . Anxiety   . Arthritis   . Colon polyp   . Condyloma    anal  . Depression   . GERD (gastroesophageal reflux disease)   . History of  transfusion    as child  . Hypertension   . Knee injury    right knee cap w piece broken off- MRI done 02-06-15-pending surgery. 11-18-16 right knee is still painful, both  shoulders(limited ROM right).  . Knee pain   . Neuropathy    bilateral- greater left  . Palpitations    normal stress test 10 yrs ago  . Pneumonia   . Thyroid disease    Thyroid removed unintentionally at age 77- no further thyroid tissue remains-uses daily supplement    SURGICAL HISTORY: Past Surgical History:  Procedure Laterality Date  . COLONOSCOPY    . COLONOSCOPY N/A 02/15/2015   Procedure: COLONOSCOPY;  Surgeon: Alphonsa Overall, MD;  Location: [facility];  Service: General;  Laterality: N/A;  . cyst removed     from neck / abdomed  . EXAMINATION UNDER ANESTHESIA N/A 02/15/2015   Procedure: EXAM UNDER ANESTHESIA;  Surgeon: Pedro Earls, MD;  Location: [facility];  Service: General;  Laterality: N/A;  . HERNIA REPAIR Left    10 yrs ago  . KNEE ARTHROSCOPY Left    x1 and meniscal tear   . ROTATOR CUFF REPAIR Right    x1  . SHOULDER ARTHROSCOPY W/ ROTATOR CUFF REPAIR Left   . THYROIDECTOMY  age 54   . TOTAL HIP ARTHROPLASTY Left 11/24/2016   Procedure: LEFT TOTAL HIP ARTHROPLASTY ANTERIOR APPROACH;  Surgeon: Paralee Cancel, MD;  Location: [facility];  Service: Orthopedics;  Laterality: Left;  . TOTAL HIP ARTHROPLASTY     Right hip  Dr. Alvan Dame 08/03/17  . TOTAL HIP ARTHROPLASTY Right 08/03/2017   Procedure: RIGHT TOTAL HIP ARTHROPLASTY ANTERIOR APPROACH;  Surgeon: Paralee Cancel, MD;  Location: [facility];  Service: Orthopedics;  Laterality: Right;  70 mins  . UMBILICAL HERNIA REPAIR N/A 10/18/2017   Procedure: Umbilical hernia repair  and excision of lipomas;  Surgeon: Johnathan Hausen, MD;  Location: [facility];  Service: General;  Laterality: N/A;  . WART FULGURATION N/A 02/15/2015   Procedure: REMOVAL OF ANAL TAGS,CONDYLOMA;  Surgeon: Pedro Earls, MD;  Location: [facility];  Service: General;  Laterality: N/A;  . WRIST SURGERY     x2- left (repair tendon/ ligament)    SOCIAL HISTORY: Social History   Socioeconomic History  . Marital status: Married    Spouse name: Not on file  . Number of  children: Not on file  . Years of education: Not on file  . Highest education level: Not on file  Occupational History  . Not on file  Tobacco Use  . Smoking status: Former Smoker    Types: Cigars  . Smokeless tobacco: Never Used  . Tobacco comment: occasional quit 2012  Vaping Use  . Vaping Use: Never used  Substance and Sexual Activity  . Alcohol use: Yes    Comment: OCCASIONALLY BEER  . Drug use: No  . Sexual activity: Yes  Other Topics Concern  . Not on file  Social History Narrative  . Not on file   Social Determinants of Health   Financial Resource Strain: Not on file  Food Insecurity: Not on file  Transportation Needs: Not on file  Physical Activity: Not on file  Stress: Not on file  Social Connections: Not on file  Intimate Partner Violence: Not on file    FAMILY HISTORY: Family History  Problem Relation Age of Onset  . Heart disease Mother   .        Marland Kitchen Lung disease Brother   . Stroke  Father     ALLERGIES:  is allergic to sulfa antibiotics and oxycodone.  MEDICATIONS:  Current Outpatient Medications  Medication Sig Dispense Refill  . aspirin 325 MG tablet Take 325 mg by mouth daily.    Marland Kitchen atorvastatin (LIPITOR) 80 MG tablet Take 80 mg by mouth daily.    . calcium carbonate (TUMS - DOSED IN MG ELEMENTAL CALCIUM) 500 MG chewable tablet Chew 2 tablets by mouth 2 (two) times daily as needed for indigestion or heartburn.     . diphenhydramine-acetaminophen (TYLENOL PM) 25-500 MG TABS tablet Take 2 tablets by mouth at bedtime.    . ferrous sulfate 325 (65 FE) MG tablet Take 1 tablet (325 mg total) by mouth 2 (two) times daily with a meal. 30 tablet 0  . HYDROcodone-acetaminophen (NORCO) 5-325 MG tablet Take 1 tablet by mouth every 4 (four) hours as needed for moderate pain. 30 tablet 0  . lisinopril (PRINIVIL,ZESTRIL) 20 MG tablet Take 20 mg by mouth daily.    . sertraline (ZOLOFT) 25 MG tablet Take 50 mg by mouth daily as needed (ANXIETY).    . SYNTHROID 175  MCG tablet Take 1 tablet (175 mcg total) by mouth daily. NO MORE REFILLS WITHOUT OFFICE VISIT - 2ND NOTICE (Patient taking differently: Take 175 mcg by mouth daily before breakfast. NO MORE REFILLS WITHOUT OFFICE VISIT - 2ND NOTICE) 15 tablet 0  . albuterol (VENTOLIN HFA) 108 (90 Base) MCG/ACT inhaler Inhale 2 puffs into the lungs as needed.    . desloratadine (CLARINEX) 5 MG tablet Take 5 mg by mouth daily.    Marland Kitchen docusate sodium (COLACE) 100 MG capsule Take 1 capsule (100 mg total) by mouth 2 (two) times daily. (Patient not taking: No sig reported) 10 capsule 0  . famotidine (PEPCID) 40 MG tablet Take 40 mg by mouth daily.    Marland Kitchen gabapentin (NEURONTIN) 300 MG capsule Take 300-600 mg by mouth 2 (two) times daily. Take 1 capsule (300 mg) in the morning & 2 capsules (600 mg) at night. (Patient not taking: Reported on 01/29/2021)    . Magnesium 200 MG TABS Take 1 tablet by mouth daily.    . methocarbamol (ROBAXIN) 500 MG tablet Take 1 tablet (500 mg total) by mouth every 6 (six) hours as needed for muscle spasms. (Patient not taking: Reported on 01/29/2021) 60 tablet 0  . nitroGLYCERIN (NITROSTAT) 0.4 MG SL tablet Place 1 tablet under the tongue as needed.    . predniSONE (STERAPRED UNI-PAK 48 TAB) 10 MG (48) TBPK tablet Take 1 tablet by mouth as directed.     No current facility-administered medications for this visit.    PHYSICAL EXAMINATION: ECOG PERFORMANCE STATUS: 0 - Asymptomatic  Vitals:   01/29/21 1116  BP: (!) 142/90  Pulse: 93  Resp: 17  Temp: (!) 96.4 F (35.8 C)  SpO2: 100%   Filed Weights   01/29/21 1116  Weight: 218 lb 1.6 oz (98.9 kg)    GENERAL:alert, no distress and comfortable SKIN: skin color, texture, turgor are normal, no rashes or significant lesions EYES: normal, conjunctiva are pink and non-injected, sclera clear OROPHARYNX:no exudate, no erythema and lips, buccal mucosa, and tongue normal  NECK: supple, thyroid normal size, non-tender, without nodularity LYMPH:  no  palpable lymphadenopathy in the cervical, axillary or inguinal LUNGS: clear to auscultation and percussion with normal breathing effort HEART: regular rate & rhythm and no murmurs and no lower extremity edema ABDOMEN:abdomen soft, non-tender and normal bowel sounds Musculoskeletal:no cyanosis of digits and no  clubbing  PSYCH: alert & oriented x 3 with fluent speech NEURO: no focal motor/sensory deficits  LABORATORY DATA:  I have reviewed the data as listed Lab Results  Component Value Date   WBC 7.3 10/15/2017   HGB 14.4 10/15/2017   HCT 41.9 10/15/2017   MCV 95.9 10/15/2017   PLT 188 10/15/2017     Chemistry      Component Value Date/Time   NA 139 10/15/2017 1415   K 3.7 10/15/2017 1415   CL 106 10/15/2017 1415   CO2 25 10/15/2017 1415   BUN 11 10/15/2017 1415   CREATININE 0.93 10/15/2017 1415   CREATININE 1.22 07/13/2013 1253      Component Value Date/Time   CALCIUM 8.9 10/15/2017 1415   ALKPHOS 44 07/13/2013 1253   AST 29 07/13/2013 1253   ALT 28 07/13/2013 1253   BILITOT 0.5 07/13/2013 1253       RADIOGRAPHIC STUDIES: I have personally reviewed the radiological images as listed and agreed with the findings in the report. No results found.  All questions were answered. The patient knows to call the clinic with any problems, questions or concerns. I spent 45 minutes in the care of this patient including H and P, review of records, counseling and coordination of care.     Benay Pike, MD 01/29/2021 11:53 AM

## 2021-01-30 ENCOUNTER — Other Ambulatory Visit: Payer: Self-pay

## 2021-01-30 ENCOUNTER — Other Ambulatory Visit: Payer: Self-pay | Admitting: Hematology and Oncology

## 2021-01-30 MED ORDER — CYANOCOBALAMIN 1000 MCG/ML IJ SOLN: 1000.0000 ug | INTRAMUSCULAR | 1 refills | Status: DC

## 2021-01-30 MED ORDER — "SYRINGE 23G X 1"" 3 ML MISC": 1.0000 mL | 0 refills | Status: AC

## 2021-01-30 MED ORDER — FOLIC ACID 1 MG PO TABS: 1.0000 mg | ORAL_TABLET | Freq: Every day | ORAL | 3 refills | Status: DC

## 2021-02-03 LAB — HEMOCHROMATOSIS DNA-PCR(C282Y,H63D)

## 2021-02-26 ENCOUNTER — Encounter: Payer: Self-pay | Admitting: Hematology and Oncology

## 2021-02-26 ENCOUNTER — Telehealth: Payer: Self-pay | Admitting: Hematology and Oncology

## 2021-02-26 ENCOUNTER — Inpatient Hospital Stay: Payer: 59 | Attending: Hematology and Oncology | Admitting: Hematology and Oncology

## 2021-02-26 ENCOUNTER — Other Ambulatory Visit: Payer: Self-pay

## 2021-02-26 ENCOUNTER — Inpatient Hospital Stay: Payer: 59

## 2021-02-26 ENCOUNTER — Telehealth: Payer: Self-pay

## 2021-02-26 VITALS — BP 137/97 | HR 119 | Temp 97.0°F | Resp 18 | Ht 69.5 in | Wt 217.5 lb

## 2021-02-26 DIAGNOSIS — D72825 Bandemia: Secondary | ICD-10-CM

## 2021-02-26 DIAGNOSIS — R21 Rash and other nonspecific skin eruption: Secondary | ICD-10-CM | POA: Insufficient documentation

## 2021-02-26 DIAGNOSIS — D7589 Other specified diseases of blood and blood-forming organs: Secondary | ICD-10-CM | POA: Insufficient documentation

## 2021-02-26 DIAGNOSIS — G620 Drug-induced polyneuropathy: Secondary | ICD-10-CM | POA: Diagnosis not present

## 2021-02-26 DIAGNOSIS — E538 Deficiency of other specified B group vitamins: Secondary | ICD-10-CM | POA: Diagnosis not present

## 2021-02-26 DIAGNOSIS — D72829 Elevated white blood cell count, unspecified: Secondary | ICD-10-CM | POA: Diagnosis present

## 2021-02-26 LAB — CBC WITH DIFFERENTIAL/PLATELET
Abs Immature Granulocytes: 0.08 10*3/uL — ABNORMAL HIGH (ref 0.00–0.07)
Basophils Absolute: 0.1 10*3/uL (ref 0.0–0.1)
Basophils Relative: 0 %
Eosinophils Absolute: 0.6 10*3/uL — ABNORMAL HIGH (ref 0.0–0.5)
Eosinophils Relative: 5 %
HCT: 45.6 % (ref 39.0–52.0)
Hemoglobin: 16.1 g/dL (ref 13.0–17.0)
Immature Granulocytes: 1 %
Lymphocytes Relative: 10 %
Lymphs Abs: 1.4 10*3/uL (ref 0.7–4.0)
MCH: 38.8 pg — ABNORMAL HIGH (ref 26.0–34.0)
MCHC: 35.3 g/dL (ref 30.0–36.0)
MCV: 109.9 fL — ABNORMAL HIGH (ref 80.0–100.0)
Monocytes Absolute: 0.9 10*3/uL (ref 0.1–1.0)
Monocytes Relative: 7 %
Neutro Abs: 10.8 10*3/uL — ABNORMAL HIGH (ref 1.7–7.7)
Neutrophils Relative %: 77 %
Platelets: 203 10*3/uL (ref 150–400)
RBC: 4.15 MIL/uL — ABNORMAL LOW (ref 4.22–5.81)
RDW: 13.5 % (ref 11.5–15.5)
WBC: 13.9 10*3/uL — ABNORMAL HIGH (ref 4.0–10.5)
nRBC: 0 % (ref 0.0–0.2)

## 2021-02-26 NOTE — Telephone Encounter (Signed)
Scheduled appointments per 3/9 los. Spoke to patient who is aware of appointment date and time.

## 2021-02-26 NOTE — Telephone Encounter (Signed)
-----   Message from Benay Pike, MD sent at 02/26/2021  1:04 PM EST ----- Please let him know that his white blood cell count continues to downtrend which we hoped for and his red blood cell size is also improving with B12 supplementation.  We will monitor these labs when he comes back to see me for follow-up.

## 2021-02-26 NOTE — Telephone Encounter (Signed)
Called patient and made him aware of Dr. Rob Hickman message below. Patient verbalized understanding.

## 2021-02-26 NOTE — Progress Notes (Signed)
[facility] NOTE  Patient Care Team: Kristie Cowman, MD as PCP - General (Family Medicine)  CHIEF COMPLAINTS/PURPOSE OF CONSULTATION:  Abnormal labs  ASSESSMENT & PLAN:  No problem-specific Assessment & Plan notes found for this encounter.  No orders of the defined types were placed in this encounter.  This is a pleasant 60 year old male patient with past medical history significant for hypertension, musculoskeletal issues, thyroid disease referred to hematology for evaluation of possible iron overload disorder.  Philip Humphrey is here for follow-up to discuss lab results and additional recommendations.  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  His labs however do not indicate presence of iron overload, no transaminitis noted.  Hence I have encouraged him to continue blood donation couple times a year and monitor labs every 6 months for hemochromatosis. 2.  Leukocytosis, predominantly neutrophilia, I have ordered a repeat CBC.  He does have an ongoing skin rash periodically which may be contributing to neutrophilia.  No other concerns for myeloproliferative disorders, no concerning review of systems. 3.  Macrocytosis likely from C58 and folic acid deficiency.  He has history of B12 deficiency.  He has started B12 supplementation, has received about 2 doses so far, has been taking folic acid daily.  Will repeat labs in 6 to 8 weeks to monitor this.  4 skin rash, recommended dermatology follow-up, he has an appointment tomorrow.  I have also advised him to refrain from alcohol intake, avoiding other hepatotoxins, preventing use of cast iron pans or supplementing iron.  He expressed understanding.  HISTORY OF PRESENTING ILLNESS:   Philip Humphrey 60 y.o. male is here because of ? Hemochromatosis.  This is a very pleasant 60 year old male patient with past medical history significant for hypertension, musculoskeletal  issues, peripheral neuropathy who was referred to hematology for evaluation of possible iron overload disorder.  Patient tells me that he was donating blood up regularly until a couple years ago but he does not know off unknown iron overload disorder that runs in the family.  He is a never smoker, rarely drinks alcohol does not have any European ancestry and does not have any family members who have required phlebotomies on a regular basis.  He denies any changes in breathing, bowel habits or urinary habits.  He tends to fall a lot and he attributes it mostly because of his neuropathy and occasionally has musculoskeletal issues in his legs give away.  He otherwise has had multiple musculoskeletalissues thyroid disease, had multiple surgeries. He was taking iron supplementation in the past but he has quit taking this.  Rest of the pertinent 10 point ROS reviewed and unremarkable.  Interval History  Philip Humphrey is here for follow-up to review labs and to discuss additional recommendations.  Since his last visit, he has noticed a flareup of his skin rash again.  He has made some changes at his home but continues to have this rash cleaner.  He has been applying hydrocortisone cream and also has a dermatology follow-up scheduled tomorrow.  Besides the skin rash, he denies any other B symptoms.  He feels well overall, rarely drinks alcohol. He denies any changes in breathing, bowel habits or urinary habits.  He has ongoing peripheral neuropathy likely from B12 deficiency.  No interim infections, hospitalizations or new medications Rest of the pertinent 10 point ROS reviewed and negative.  MEDICAL HISTORY:  Past Medical History:  Diagnosis Date  . Allergy  occ. seasonal  . Anxiety   . Arthritis   . Colon polyp   . Condyloma    anal  . Depression   . GERD (gastroesophageal reflux disease)   . History of transfusion    as child  . Hypertension   . Knee injury    right knee cap w piece broken  off- MRI done 02-06-15-pending surgery. 11-18-16 right knee is still painful, both shoulders(limited ROM right).  . Knee pain   . Neuropathy    bilateral- greater left  . Palpitations    normal stress test 10 yrs ago  . Pneumonia   . Thyroid disease    Thyroid removed unintentionally at age 15- no further thyroid tissue remains-uses daily supplement    SURGICAL HISTORY: Past Surgical History:  Procedure Laterality Date  . COLONOSCOPY    . COLONOSCOPY N/A 02/15/2015   Procedure: COLONOSCOPY;  Surgeon: Alphonsa Overall, MD;  Location: [facility];  Service: General;  Laterality: N/A;  . cyst removed     from neck / abdomed  . EXAMINATION UNDER ANESTHESIA N/A 02/15/2015   Procedure: EXAM UNDER ANESTHESIA;  Surgeon: Pedro Earls, MD;  Location: [facility];  Service: General;  Laterality: N/A;  . HERNIA REPAIR Left    10 yrs ago  . KNEE ARTHROSCOPY Left    x1 and meniscal tear   . ROTATOR CUFF REPAIR Right    x1  . SHOULDER ARTHROSCOPY W/ ROTATOR CUFF REPAIR Left   . THYROIDECTOMY  age 52   . TOTAL HIP ARTHROPLASTY Left 11/24/2016   Procedure: LEFT TOTAL HIP ARTHROPLASTY ANTERIOR APPROACH;  Surgeon: Paralee Cancel, MD;  Location: [facility];  Service: Orthopedics;  Laterality: Left;  . TOTAL HIP ARTHROPLASTY     Right hip  Dr. Alvan Dame 08/03/17  . TOTAL HIP ARTHROPLASTY Right 08/03/2017   Procedure: RIGHT TOTAL HIP ARTHROPLASTY ANTERIOR APPROACH;  Surgeon: Paralee Cancel, MD;  Location: [facility];  Service: Orthopedics;  Laterality: Right;  70 mins  . UMBILICAL HERNIA REPAIR N/A 10/18/2017   Procedure: Umbilical hernia repair  and excision of lipomas;  Surgeon: Johnathan Hausen, MD;  Location: [facility];  Service: General;  Laterality: N/A;  . WART FULGURATION N/A 02/15/2015   Procedure: REMOVAL OF ANAL TAGS,CONDYLOMA;  Surgeon: Pedro Earls, MD;  Location: [facility];  Service: General;  Laterality: N/A;  . WRIST SURGERY     x2- left (repair tendon/ ligament)    SOCIAL HISTORY: Social History   Socioeconomic  History  . Marital status: Married    Spouse name: Not on file  . Number of children: Not on file  . Years of education: Not on file  . Highest education level: Not on file  Occupational History  . Not on file  Tobacco Use  . Smoking status: Former Smoker    Types: Cigars  . Smokeless tobacco: Never Used  . Tobacco comment: occasional quit 2012  Vaping Use  . Vaping Use: Never used  Substance and Sexual Activity  . Alcohol use: Yes    Comment: OCCASIONALLY BEER  . Drug use: No  . Sexual activity: Yes  Other Topics Concern  . Not on file  Social History Narrative  . Not on file   Social Determinants of Health   Financial Resource Strain: Not on file  Food Insecurity: Not on file  Transportation Needs: Not on file  Physical Activity: Not on file  Stress: Not on file  Social Connections: Not on file  Intimate Partner Violence: Not on  file    FAMILY HISTORY: Family History  Problem Relation Age of Onset  . Heart disease Mother   .        Marland Kitchen Lung disease Brother   . Stroke Father     ALLERGIES:  is allergic to sulfa antibiotics and oxycodone.  MEDICATIONS:  Current Outpatient Medications  Medication Sig Dispense Refill  . albuterol (VENTOLIN HFA) 108 (90 Base) MCG/ACT inhaler Inhale 2 puffs into the lungs as needed.    Marland Kitchen aspirin 325 MG tablet Take 325 mg by mouth daily.    Marland Kitchen atorvastatin (LIPITOR) 80 MG tablet Take 80 mg by mouth daily.    . calcium carbonate (TUMS - DOSED IN MG ELEMENTAL CALCIUM) 500 MG chewable tablet Chew 2 tablets by mouth 2 (two) times daily as needed for indigestion or heartburn.     . desloratadine (CLARINEX) 5 MG tablet Take 5 mg by mouth daily.    . diphenhydramine-acetaminophen (TYLENOL PM) 25-500 MG TABS tablet Take 2 tablets by mouth at bedtime.    . docusate sodium (COLACE) 100 MG capsule Take 1 capsule (100 mg total) by mouth 2 (two) times daily. (Patient not taking: No sig reported) 10 capsule 0  . famotidine (PEPCID) 40 MG tablet  Take 40 mg by mouth daily.    . ferrous sulfate 325 (65 FE) MG tablet Take 1 tablet (325 mg total) by mouth 2 (two) times daily with a meal. 30 tablet 0  . folic acid (FOLVITE) 1 MG tablet Take 1 tablet (1 mg total) by mouth daily. 30 tablet 3  . gabapentin (NEURONTIN) 300 MG capsule Take 300-600 mg by mouth 2 (two) times daily. Take 1 capsule (300 mg) in the morning & 2 capsules (600 mg) at night. (Patient not taking: Reported on 01/29/2021)    . HYDROcodone-acetaminophen (NORCO) 5-325 MG tablet Take 1 tablet by mouth every 4 (four) hours as needed for moderate pain. 30 tablet 0  . lisinopril (PRINIVIL,ZESTRIL) 20 MG tablet Take 20 mg by mouth daily.    . Magnesium 200 MG TABS Take 1 tablet by mouth daily.    . methocarbamol (ROBAXIN) 500 MG tablet Take 1 tablet (500 mg total) by mouth every 6 (six) hours as needed for muscle spasms. (Patient not taking: Reported on 01/29/2021) 60 tablet 0  . nitroGLYCERIN (NITROSTAT) 0.4 MG SL tablet Place 1 tablet under the tongue as needed.    . predniSONE (STERAPRED UNI-PAK 48 TAB) 10 MG (48) TBPK tablet Take 1 tablet by mouth as directed.    . sertraline (ZOLOFT) 25 MG tablet Take 50 mg by mouth daily as needed (ANXIETY).    . SYNTHROID 175 MCG tablet Take 1 tablet (175 mcg total) by mouth daily. NO MORE REFILLS WITHOUT OFFICE VISIT - 2ND NOTICE (Patient taking differently: Take 175 mcg by mouth daily before breakfast. NO MORE REFILLS WITHOUT OFFICE VISIT - 2ND NOTICE) 15 tablet 0  . Syringe/Needle, Disp, (SYRINGE 3CC/23GX1") 23G X 1" 3 ML MISC Inject 1 mL into the muscle once a week for 8 doses. 10 each 0   No current facility-administered medications for this visit.    PHYSICAL EXAMINATION: ECOG PERFORMANCE STATUS: 0 - Asymptomatic  Vitals:   02/26/21 0944  BP: (!) 137/97  Pulse: (!) 119  Resp: 18  Temp: (!) 97 F (36.1 C)  SpO2: 99%   Filed Weights   02/26/21 0944  Weight: 217 lb 8 oz (98.7 kg)    Physical Exam Constitutional:  Appearance: Normal appearance.  HENT:     Head: Normocephalic and atraumatic.  Cardiovascular:     Rate and Rhythm: Regular rhythm. Tachycardia present.     Pulses: Normal pulses.     Heart sounds: Normal heart sounds.  Pulmonary:     Effort: Pulmonary effort is normal.     Breath sounds: Normal breath sounds.  Abdominal:     General: Abdomen is flat. Bowel sounds are normal.     Palpations: Abdomen is soft.  Musculoskeletal:        General: No swelling or tenderness. Normal range of motion.     Cervical back: Normal range of motion and neck supple. No rigidity or tenderness.  Lymphadenopathy:     Cervical: No cervical adenopathy.  Skin:    Findings: Rash present.  Neurological:     General: No focal deficit present.     Mental Status: He is alert.  Psychiatric:        Mood and Affect: Mood normal.        Thought Content: Thought content normal.    Sinus tachycardia likely from dizziness reported by the patient since he did not get relief his breakfast today.   LABORATORY DATA:  I have reviewed the data as listed Lab Results  Component Value Date   WBC 17.1 (H) 01/29/2021   HGB 14.5 01/29/2021   HCT 42.0 01/29/2021   MCV 116.3 (H) 01/29/2021   PLT 175 01/29/2021     Chemistry      Component Value Date/Time   NA 137 01/29/2021 1202   K 4.7 01/29/2021 1202   CL 101 01/29/2021 1202   CO2 30 01/29/2021 1202   BUN 15 01/29/2021 1202   CREATININE 0.86 01/29/2021 1202   CREATININE 1.22 07/13/2013 1253      Component Value Date/Time   CALCIUM 9.3 01/29/2021 1202   ALKPHOS 54 01/29/2021 1202   AST 21 01/29/2021 1202   ALT 39 01/29/2021 1202   BILITOT 0.4 01/29/2021 1202      Reviewed all available labs with the patient RADIOGRAPHIC STUDIES: I have personally reviewed the radiological images as listed and agreed with the findings in the report. No results found.  All questions were answered. The patient knows to call the clinic with any problems, questions or  concerns. I spent 30 minutes in the care of this patient including H and P, review of records, counseling and coordination of care.     Benay Pike, MD 02/26/2021 9:58 AM

## 2021-02-27 ENCOUNTER — Other Ambulatory Visit: Payer: Self-pay | Admitting: Hematology and Oncology

## 2021-02-27 NOTE — Telephone Encounter (Signed)
Dr. Chryl Heck, before I send the medication I just wanted to check with you to see if you would still like for him to continue this specific regimen of B12.

## 2021-03-22 ENCOUNTER — Other Ambulatory Visit: Payer: Self-pay | Admitting: Hematology and Oncology

## 2021-04-23 ENCOUNTER — Other Ambulatory Visit: Payer: Self-pay | Admitting: Hematology and Oncology

## 2021-04-23 ENCOUNTER — Inpatient Hospital Stay: Payer: 59 | Attending: Hematology and Oncology | Admitting: Hematology and Oncology

## 2021-04-23 ENCOUNTER — Encounter: Payer: Self-pay | Admitting: Hematology and Oncology

## 2021-04-23 ENCOUNTER — Other Ambulatory Visit: Payer: Self-pay

## 2021-04-23 ENCOUNTER — Telehealth: Payer: Self-pay | Admitting: Hematology and Oncology

## 2021-04-23 ENCOUNTER — Inpatient Hospital Stay: Payer: 59

## 2021-04-23 VITALS — BP 112/71 | HR 99 | Temp 96.2°F | Resp 19 | Ht 69.5 in | Wt 231.5 lb

## 2021-04-23 DIAGNOSIS — E538 Deficiency of other specified B group vitamins: Secondary | ICD-10-CM | POA: Diagnosis not present

## 2021-04-23 DIAGNOSIS — R112 Nausea with vomiting, unspecified: Secondary | ICD-10-CM | POA: Diagnosis not present

## 2021-04-23 DIAGNOSIS — M25552 Pain in left hip: Secondary | ICD-10-CM | POA: Insufficient documentation

## 2021-04-23 DIAGNOSIS — F419 Anxiety disorder, unspecified: Secondary | ICD-10-CM | POA: Diagnosis not present

## 2021-04-23 DIAGNOSIS — A630 Anogenital (venereal) warts: Secondary | ICD-10-CM | POA: Insufficient documentation

## 2021-04-23 DIAGNOSIS — L659 Nonscarring hair loss, unspecified: Secondary | ICD-10-CM | POA: Insufficient documentation

## 2021-04-23 DIAGNOSIS — G8929 Other chronic pain: Secondary | ICD-10-CM | POA: Diagnosis not present

## 2021-04-23 DIAGNOSIS — D721 Eosinophilia, unspecified: Secondary | ICD-10-CM | POA: Insufficient documentation

## 2021-04-23 DIAGNOSIS — Z87891 Personal history of nicotine dependence: Secondary | ICD-10-CM | POA: Diagnosis not present

## 2021-04-23 DIAGNOSIS — E079 Disorder of thyroid, unspecified: Secondary | ICD-10-CM | POA: Insufficient documentation

## 2021-04-23 DIAGNOSIS — K219 Gastro-esophageal reflux disease without esophagitis: Secondary | ICD-10-CM | POA: Insufficient documentation

## 2021-04-23 DIAGNOSIS — I10 Essential (primary) hypertension: Secondary | ICD-10-CM | POA: Insufficient documentation

## 2021-04-23 DIAGNOSIS — D7219 Other eosinophilia: Secondary | ICD-10-CM

## 2021-04-23 DIAGNOSIS — M25551 Pain in right hip: Secondary | ICD-10-CM | POA: Insufficient documentation

## 2021-04-23 DIAGNOSIS — Z9181 History of falling: Secondary | ICD-10-CM | POA: Diagnosis not present

## 2021-04-23 DIAGNOSIS — F32A Depression, unspecified: Secondary | ICD-10-CM | POA: Insufficient documentation

## 2021-04-23 DIAGNOSIS — M1990 Unspecified osteoarthritis, unspecified site: Secondary | ICD-10-CM | POA: Diagnosis not present

## 2021-04-23 DIAGNOSIS — G629 Polyneuropathy, unspecified: Secondary | ICD-10-CM | POA: Diagnosis not present

## 2021-04-23 DIAGNOSIS — Z79899 Other long term (current) drug therapy: Secondary | ICD-10-CM | POA: Insufficient documentation

## 2021-04-23 DIAGNOSIS — Z7982 Long term (current) use of aspirin: Secondary | ICD-10-CM | POA: Insufficient documentation

## 2021-04-23 DIAGNOSIS — R21 Rash and other nonspecific skin eruption: Secondary | ICD-10-CM | POA: Diagnosis not present

## 2021-04-23 LAB — FOLATE: Folate: 4.9 ng/mL — ABNORMAL LOW (ref >5.9)

## 2021-04-23 LAB — CBC WITH DIFFERENTIAL/PLATELET
Abs Immature Granulocytes: 0.02 10*3/uL (ref 0.00–0.07)
Basophils Absolute: 0.1 10*3/uL (ref 0.0–0.1)
Basophils Relative: 1 %
Eosinophils Absolute: 0.3 10*3/uL (ref 0.0–0.5)
Eosinophils Relative: 5 %
HCT: 38.8 % — ABNORMAL LOW (ref 39.0–52.0)
Hemoglobin: 13.5 g/dL (ref 13.0–17.0)
Immature Granulocytes: 0 %
Lymphocytes Relative: 15 %
Lymphs Abs: 0.9 10*3/uL (ref 0.7–4.0)
MCH: 38.1 pg — ABNORMAL HIGH (ref 26.0–34.0)
MCHC: 34.8 g/dL (ref 30.0–36.0)
MCV: 109.6 fL — ABNORMAL HIGH (ref 80.0–100.0)
Monocytes Absolute: 0.5 10*3/uL (ref 0.1–1.0)
Monocytes Relative: 8 %
Neutro Abs: 4.5 10*3/uL (ref 1.7–7.7)
Neutrophils Relative %: 71 %
Platelets: 150 10*3/uL (ref 150–400)
RBC: 3.54 MIL/uL — ABNORMAL LOW (ref 4.22–5.81)
RDW: 13.6 % (ref 11.5–15.5)
WBC: 6.2 10*3/uL (ref 4.0–10.5)
nRBC: 0 % (ref 0.0–0.2)

## 2021-04-23 LAB — FERRITIN: Ferritin: 39 ng/mL (ref 24–336)

## 2021-04-23 LAB — VITAMIN B12: Vitamin B-12: 153 pg/mL — ABNORMAL LOW (ref 180–914)

## 2021-04-23 LAB — CMP (CANCER CENTER ONLY)
ALT: 29 U/L (ref 0–44)
AST: 39 U/L (ref 15–41)
Albumin: 3.7 g/dL (ref 3.5–5.0)
Alkaline Phosphatase: 68 U/L (ref 38–126)
Anion gap: 9 (ref 5–15)
BUN: 8 mg/dL (ref 6–20)
CO2: 28 mmol/L (ref 22–32)
Calcium: 9.2 mg/dL (ref 8.9–10.3)
Chloride: 106 mmol/L (ref 98–111)
Creatinine: 0.97 mg/dL (ref 0.61–1.24)
GFR, Estimated: >60 mL/min (ref >60)
Glucose, Bld: 119 mg/dL — ABNORMAL HIGH (ref 70–99)
Potassium: 3.9 mmol/L (ref 3.5–5.1)
Sodium: 143 mmol/L (ref 135–145)
Total Bilirubin: 0.4 mg/dL (ref 0.3–1.2)
Total Protein: 6.4 g/dL — ABNORMAL LOW (ref 6.5–8.1)

## 2021-04-23 LAB — IRON AND TIBC
Iron: 97 ug/dL (ref 45–182)
Saturation Ratios: 25 % (ref 17.9–39.5)
TIBC: 386 ug/dL (ref 250–450)
UIBC: 289 ug/dL

## 2021-04-23 NOTE — Progress Notes (Signed)
[facility] NOTE  Patient Care Team: Kristie Cowman, MD as PCP - General (Family Medicine)  CHIEF COMPLAINTS/PURPOSE OF CONSULTATION:  Abnormal labs  ASSESSMENT & PLAN:  No problem-specific Assessment & Plan notes found for this encounter.  Orders Placed This Encounter  Procedures  . Vitamin B12    Standing Status:   Future    Standing Expiration Date:   04/23/2022  . Folate, Serum    Standing Status:   Future    Standing Expiration Date:   04/23/2022  . Flow Cytometry, Peripheral Blood (Oncology)    Standing Status:   Future    Standing Expiration Date:   04/23/2022  . 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 Tryptase    Standing Status:   Future    Standing Expiration Date:   04/23/2022  . JAK2 (INCLUDING V617F AND EXON 12), MPL,& CALR W/RFL MPN PANEL (NGS)   This is a pleasant 60 year old male patient with past medical history significant for hypertension, musculoskeletal issues, thyroid disease referred to hematology for evaluation of possible iron overload disorder. 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 We will repeat iron labs today, and evaluate for ongoing iron overload  2. Skin rash, eosinophilia, leukocytosis peripheral neuropathy and Gastrointestinal issues such as nausea and vomiting. According to the patient Philip Humphrey has seen a dermatologist, thought to have allergies to dogs, Philip Humphrey is getting a weekly injection, his symptoms have improved. Given this constellation of symptoms, I have asked for FISH testing for peripheral blood for PDGFRA fusion, JAK 2 testing and flow to look for HES.  3.  Falls, chronic pain in both hips radiating down the leg. According to the patient this is a chronic complaint. Philip Humphrey doesn't think this has changed significantly. Philip Humphrey was encouraged to FU with his other specialists.  4. Hair loss on BLE. Likely vascular insufficiency Encouraged to touch base with Vascular surgery.  HISTORY OF PRESENTING ILLNESS:   Philip Humphrey 60 y.o. 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  This is a very pleasant 60 year old male patient with past medical history significant for hypertension, musculoskeletal issues, peripheral neuropathy who was referred to hematology for evaluation of possible iron overload disorder.   Interval History  Philip Humphrey is here for FU by himself. Philip Humphrey has multiple issues going on. Philip Humphrey complains of hip pain radiating to the leg which is a chronic issue,no sig change. Philip Humphrey has intermittent falls mostly because his legs give away. Skin rash continues, taking some injections every week, this helps but Philip Humphrey continues to have the rash. Philip Humphrey complains of some nausea and vomiting which has improved a bit, Philip Humphrey lost lot of weight because of this, this has improved. Philip Humphrey complains of hair loss over BLE. Peripheral neuropathy is bothersome. No change in breathing, bowel habits or urinary habits.  MEDICAL HISTORY:  Past Medical History:  Diagnosis Date  . Allergy    occ. seasonal  . Anxiety   . Arthritis   . Colon polyp   . Condyloma    anal  . Depression   . GERD (gastroesophageal reflux  disease)   . History of transfusion    as child  . Hypertension   . Knee injury    right knee cap w piece broken off- MRI done 02-06-15-pending surgery. 11-18-16 right knee is still painful, both shoulders(limited ROM right).  . Knee pain   . Neuropathy    bilateral- greater left  . Palpitations    normal stress test 10 yrs ago  . Pneumonia   . Thyroid disease    Thyroid removed unintentionally at age 58- no further thyroid tissue remains-uses daily supplement     SURGICAL HISTORY: Past Surgical History:  Procedure Laterality Date  . COLONOSCOPY    . COLONOSCOPY N/A 02/15/2015   Procedure: COLONOSCOPY;  Surgeon: Alphonsa Overall, MD;  Location: [facility];  Service: General;  Laterality: N/A;  . cyst removed     from neck / abdomed  . EXAMINATION UNDER ANESTHESIA N/A 02/15/2015   Procedure: EXAM UNDER ANESTHESIA;  Surgeon: Pedro Earls, MD;  Location: [facility];  Service: General;  Laterality: N/A;  . HERNIA REPAIR Left    10 yrs ago  . KNEE ARTHROSCOPY Left    x1 and meniscal tear   . ROTATOR CUFF REPAIR Right    x1  . SHOULDER ARTHROSCOPY W/ ROTATOR CUFF REPAIR Left   . THYROIDECTOMY  age 76   . TOTAL HIP ARTHROPLASTY Left 11/24/2016   Procedure: LEFT TOTAL HIP ARTHROPLASTY ANTERIOR APPROACH;  Surgeon: Paralee Cancel, MD;  Location: [facility];  Service: Orthopedics;  Laterality: Left;  . TOTAL HIP ARTHROPLASTY     Right hip  Dr. Alvan Dame 08/03/17  . TOTAL HIP ARTHROPLASTY Right 08/03/2017   Procedure: RIGHT TOTAL HIP ARTHROPLASTY ANTERIOR APPROACH;  Surgeon: Paralee Cancel, MD;  Location: [facility];  Service: Orthopedics;  Laterality: Right;  70 mins  . UMBILICAL HERNIA REPAIR N/A 10/18/2017   Procedure: Umbilical hernia repair  and excision of lipomas;  Surgeon: Johnathan Hausen, MD;  Location: [facility];  Service: General;  Laterality: N/A;  . WART FULGURATION N/A 02/15/2015   Procedure: REMOVAL OF ANAL TAGS,CONDYLOMA;  Surgeon: Pedro Earls, MD;  Location: [facility];  Service: General;  Laterality: N/A;  . WRIST SURGERY     x2- left (repair tendon/ ligament)    SOCIAL HISTORY: Social History   Socioeconomic History  . Marital status: Married    Spouse name: Not on file  . Number of children: Not on file  . Years of education: Not on file  . Highest education level: Not on file  Occupational History  . Not on file  Tobacco Use  . Smoking status: Former Smoker    Types: Cigars  . Smokeless tobacco: Never Used  . Tobacco comment: occasional quit 2012   Vaping Use  . Vaping Use: Never used  Substance and Sexual Activity  . Alcohol use: Yes    Comment: OCCASIONALLY BEER  . Drug use: No  . Sexual activity: Yes  Other Topics Concern  . Not on file  Social History Narrative  . Not on file   Social Determinants of Health   Financial Resource Strain: Not on file  Food Insecurity: Not on file  Transportation Needs: Not on file  Physical Activity: Not on file  Stress: Not on file  Social Connections: Not on file  Intimate Partner Violence: Not on file    FAMILY HISTORY: Family History  Problem Relation Age of Onset  . Heart disease Mother   .        Marland Kitchen Lung  disease Brother   . Stroke Father     ALLERGIES:  is allergic to sulfa antibiotics and oxycodone.  MEDICATIONS:  Current Outpatient Medications  Medication Sig Dispense Refill  . albuterol (VENTOLIN HFA) 108 (90 Base) MCG/ACT inhaler Inhale 2 puffs into the lungs as needed.    Marland Kitchen aspirin 325 MG tablet Take 325 mg by mouth daily.    Marland Kitchen atorvastatin (LIPITOR) 80 MG tablet Take 80 mg by mouth daily.    . calcium carbonate (TUMS - DOSED IN MG ELEMENTAL CALCIUM) 500 MG chewable tablet Chew 2 tablets by mouth 2 (two) times daily as needed for indigestion or heartburn.     . desloratadine (CLARINEX) 5 MG tablet Take 5 mg by mouth daily.    . diphenhydramine-acetaminophen (TYLENOL PM) 25-500 MG TABS tablet Take 2 tablets by mouth at bedtime.    . docusate sodium (COLACE) 100 MG capsule Take 1 capsule (100 mg total) by mouth 2 (two) times daily. (Patient not taking: No sig reported) 10 capsule 0  . famotidine (PEPCID) 40 MG tablet Take 40 mg by mouth daily.    . folic acid (FOLVITE) 1 MG tablet Take 1 tablet (1 mg total) by mouth daily. 30 tablet 3  . gabapentin (NEURONTIN) 300 MG capsule Take 300-600 mg by mouth 2 (two) times daily. Take 1 capsule (300 mg) in the morning & 2 capsules (600 mg) at night. (Patient not taking: Reported on 01/29/2021)    . HYDROcodone-acetaminophen  (NORCO) 5-325 MG tablet Take 1 tablet by mouth every 4 (four) hours as needed for moderate pain. 30 tablet 0  . lisinopril (PRINIVIL,ZESTRIL) 20 MG tablet Take 20 mg by mouth daily.    . Magnesium 200 MG TABS Take 1 tablet by mouth daily.    . methocarbamol (ROBAXIN) 500 MG tablet Take 1 tablet (500 mg total) by mouth every 6 (six) hours as needed for muscle spasms. (Patient not taking: Reported on 01/29/2021) 60 tablet 0  . nitroGLYCERIN (NITROSTAT) 0.4 MG SL tablet Place 1 tablet under the tongue as needed.    . predniSONE (STERAPRED UNI-PAK 48 TAB) 10 MG (48) TBPK tablet Take 1 tablet by mouth as directed.    . sertraline (ZOLOFT) 25 MG tablet Take 50 mg by mouth daily as needed (ANXIETY).    . SYNTHROID 175 MCG tablet Take 1 tablet (175 mcg total) by mouth daily. NO MORE REFILLS WITHOUT OFFICE VISIT - 2ND NOTICE (Patient taking differently: Take 175 mcg by mouth daily before breakfast. NO MORE REFILLS WITHOUT OFFICE VISIT - 2ND NOTICE) 15 tablet 0   No current facility-administered medications for this visit.    PHYSICAL EXAMINATION: ECOG PERFORMANCE STATUS: 0 - Asymptomatic  Vitals:   04/23/21 1000  BP: 112/71  Pulse: 99  Resp: 19  Temp: (!) 96.2 F (35.7 C)  SpO2: 100%   Filed Weights   04/23/21 1000  Weight: 231 lb 8 oz (105 kg)    Physical Exam Constitutional:      Appearance: Normal appearance.  HENT:     Head: Normocephalic and atraumatic.  Cardiovascular:     Rate and Rhythm: Regular rhythm. Tachycardia present.     Pulses: Normal pulses.     Heart sounds: Normal heart sounds.  Pulmonary:     Effort: Pulmonary effort is normal.     Breath sounds: Normal breath sounds.  Abdominal:     General: Abdomen is flat. Bowel sounds are normal.     Palpations: Abdomen is soft.  Musculoskeletal:  General: No swelling or tenderness. Normal range of motion.     Cervical back: Normal range of motion and neck supple. No rigidity or tenderness.  Lymphadenopathy:      Cervical: No cervical adenopathy.  Skin:    Findings: Rash (Rash appears lacy) present.  Neurological:     General: No focal deficit present.     Mental Status: Philip Humphrey is alert.  Psychiatric:        Mood and Affect: Mood normal.        Thought Content: Thought content normal.     LABORATORY DATA:  I have reviewed the data as listed Lab Results  Component Value Date   WBC 13.9 (H) 02/26/2021   HGB 16.1 02/26/2021   HCT 45.6 02/26/2021   MCV 109.9 (H) 02/26/2021   PLT 203 02/26/2021     Chemistry      Component Value Date/Time   NA 137 01/29/2021 1202   K 4.7 01/29/2021 1202   CL 101 01/29/2021 1202   CO2 30 01/29/2021 1202   BUN 15 01/29/2021 1202   CREATININE 0.86 01/29/2021 1202   CREATININE 1.22 07/13/2013 1253      Component Value Date/Time   CALCIUM 9.3 01/29/2021 1202   ALKPHOS 54 01/29/2021 1202   AST 21 01/29/2021 1202   ALT 39 01/29/2021 1202   BILITOT 0.4 01/29/2021 1202       RADIOGRAPHIC STUDIES: I have personally reviewed the radiological images as listed and agreed with the findings in the report. No results found.  All questions were answered. The patient knows to call the clinic with any problems, questions or concerns. I spent 30 minutes in the care of this patient including H and P, review of records, counseling and coordination of care.     Benay Pike, MD 04/23/2021 10:53 AM

## 2021-04-23 NOTE — Telephone Encounter (Signed)
Scheduled follow-up appointment per 5/4 los. Patient is aware. ?

## 2021-04-24 LAB — SURGICAL PATHOLOGY

## 2021-04-25 LAB — TRYPTASE: Tryptase: 10.9 ug/L (ref 2.2–13.2)

## 2021-04-27 ENCOUNTER — Other Ambulatory Visit: Payer: Self-pay | Admitting: Hematology and Oncology

## 2021-04-29 LAB — FLOW CYTOMETRY

## 2021-04-30 LAB — MPN W/HYPEREOSINOPHILIA FISH
MPN Cells Analyzed: 100
MPN Cells Counted: 100

## 2021-05-11 ENCOUNTER — Other Ambulatory Visit: Payer: Self-pay | Admitting: Hematology and Oncology

## 2021-06-03 NOTE — Progress Notes (Signed)
[facility] NOTE  Patient Care Team: Kristie Cowman, MD as PCP - General (Family Medicine)  CHIEF COMPLAINTS/PURPOSE OF CONSULTATION:  Follow-up about iron overload disorder  ASSESSMENT & PLAN:   This is a pleasant 60 year old male patient with past medical history significant for hypertension, musculoskeletal issues, thyroid disease referred to hematology for evaluation of possible iron overload disorder.  #1.  Iron overload disorder his last labs in May showed a ferritin less than 50.  He periodically donates blood and was planning to donate next week.  We will repeat labs today and reevaluate the need for phlebotomy versus blood donation.  2. Skin rash, eosinophilia, leukocytosis peripheral neuropathy and Gastrointestinal issues such as nausea and vomiting. HES work up negative. His skin rashes thought to be secondary to allergy to his dogs.  I recommended he also see a dermatologist and consider a skin biopsy.  At this time I do not believe he has any clear evidence of hypereosinophilia syndrome.  3. Falls, chronic pain in both hips radiating down the leg. No change, continue follow-up with the other doctors.  4.  B12 deficiency, we have started him on B12 injections.  He apparently had labs recently with his PCP and he was told to stop the B12 injections.  We will check on his B12 levels today and if he does not have any further evidence of B12 deficiency, I agree with his PCP recommendations to just consider a multivitamin supplement.  He did not notice any improvement with the B12 supplementation with regards to his neuropathy.  If labs from today do not show any major abnormalities, he can return to clinic with [place] in 6 months.  Age-appropriate cancer screening recommended. HISTORY OF PRESENTING ILLNESS:   Philip Humphrey 60 y.o. 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  This is a very pleasant 59 year old male patient with past medical history significant  for hypertension, musculoskeletal issues, peripheral neuropathy who was referred to hematology for evaluation of possible iron overload disorder.   Interval History  He is here for FU by himself.  He is doing really well except for his chronic hip pain, some weight gain from prednisone use which was given to him for allergies.  He continues to deal with the skin rash and this was attributed to allergy and he is taking prednisone prescribed by his allergist.  He was also recommended to go see a dermatologist soon.  Besides the chronic hip pain, peripheral neuropathy and ongoing skin rash no new complaints.  He had some labs with his primary care doctor recently and he was told to stop the B12 supplementation.  Rest of the pertinent 10 point ROS reviewed and negative.  MEDICAL HISTORY:  Past Medical History:  Diagnosis Date   Allergy    occ. seasonal   Anxiety    Arthritis    Colon polyp    Condyloma    anal   Depression    GERD (gastroesophageal reflux disease)    History of transfusion    as child   Hypertension    Knee injury    right knee cap w piece broken off- MRI done 02-06-15-pending surgery. 11-18-16 right knee is still painful, both shoulders(limited ROM right).   Knee pain    Neuropathy    bilateral- greater left   Palpitations    normal stress test 10 yrs ago   Pneumonia    Thyroid disease    Thyroid removed unintentionally at age 56- no further thyroid tissue  remains-uses daily supplement    SURGICAL HISTORY: Past Surgical History:  Procedure Laterality Date   COLONOSCOPY     COLONOSCOPY N/A 02/15/2015   Procedure: COLONOSCOPY;  Surgeon: Alphonsa Overall, MD;  Location: [facility];  Service: General;  Laterality: N/A;   cyst removed     from neck / abdomed   EXAMINATION UNDER ANESTHESIA N/A 02/15/2015   Procedure: EXAM UNDER ANESTHESIA;  Surgeon: Pedro Earls, MD;  Location: [facility];  Service: General;  Laterality: N/A;   HERNIA REPAIR Left    10 yrs ago   KNEE  ARTHROSCOPY Left    x1 and meniscal tear    ROTATOR CUFF REPAIR Right    x1   SHOULDER ARTHROSCOPY W/ ROTATOR CUFF REPAIR Left    THYROIDECTOMY  age 44    TOTAL HIP ARTHROPLASTY Left 11/24/2016   Procedure: LEFT TOTAL HIP ARTHROPLASTY ANTERIOR APPROACH;  Surgeon: Paralee Cancel, MD;  Location: [facility];  Service: Orthopedics;  Laterality: Left;   TOTAL HIP ARTHROPLASTY     Right hip  Dr. Alvan Dame 08/03/17   TOTAL HIP ARTHROPLASTY Right 08/03/2017   Procedure: RIGHT TOTAL HIP ARTHROPLASTY ANTERIOR APPROACH;  Surgeon: Paralee Cancel, MD;  Location: [facility];  Service: Orthopedics;  Laterality: Right;  70 mins   UMBILICAL HERNIA REPAIR N/A 10/18/2017   Procedure: Umbilical hernia repair  and excision of lipomas;  Surgeon: Johnathan Hausen, MD;  Location: [facility];  Service: General;  Laterality: N/A;   WART FULGURATION N/A 02/15/2015   Procedure: REMOVAL OF ANAL TAGS,CONDYLOMA;  Surgeon: Pedro Earls, MD;  Location: [facility];  Service: General;  Laterality: N/A;   WRIST SURGERY     x2- left (repair tendon/ ligament)    SOCIAL HISTORY: Social History   Socioeconomic History   Marital status: Married    Spouse name: Not on file   Number of children: Not on file   Years of education: Not on file   Highest education level: Not on file  Occupational History   Not on file  Tobacco Use   Smoking status: Former    Pack years: 0.00    Types: Cigars   Smokeless tobacco: Never   Tobacco comments:    occasional quit 2012  Vaping Use   Vaping Use: Never used  Substance and Sexual Activity   Alcohol use: Yes    Comment: OCCASIONALLY BEER   Drug use: No   Sexual activity: Yes  Other Topics Concern   Not on file  Social History Narrative   Not on file   Social Determinants of Health   Financial Resource Strain: Not on file  Food Insecurity: Not on file  Transportation Needs: Not on file  Physical Activity: Not on file  Stress: Not on file  Social Connections: Not on file  Intimate Partner  Violence: Not on file    FAMILY HISTORY: Family History  Problem Relation Age of Onset   Heart disease Mother            Lung disease Brother    Stroke Father     ALLERGIES:  is allergic to sulfa antibiotics and oxycodone.  MEDICATIONS:  Current Outpatient Medications  Medication Sig Dispense Refill   albuterol (VENTOLIN HFA) 108 (90 Base) MCG/ACT inhaler Inhale 2 puffs into the lungs as needed.     aspirin 325 MG tablet Take 325 mg by mouth daily.     atorvastatin (LIPITOR) 80 MG tablet Take 80 mg by mouth daily.     calcium carbonate (  TUMS - DOSED IN MG ELEMENTAL CALCIUM) 500 MG chewable tablet Chew 2 tablets by mouth 2 (two) times daily as needed for indigestion or heartburn.      desloratadine (CLARINEX) 5 MG tablet Take 5 mg by mouth daily.     diphenhydramine-acetaminophen (TYLENOL PM) 25-500 MG TABS tablet Take 2 tablets by mouth at bedtime.     famotidine (PEPCID) 40 MG tablet Take 40 mg by mouth daily.     folic acid (FOLVITE) 1 MG tablet TAKE 1 TABLET BY MOUTH EVERY DAY 90 tablet 1   HYDROcodone-acetaminophen (NORCO) 5-325 MG tablet Take 1 tablet by mouth every 4 (four) hours as needed for moderate pain. 30 tablet 0   lisinopril (PRINIVIL,ZESTRIL) 20 MG tablet Take 20 mg by mouth daily.     Magnesium 200 MG TABS Take 1 tablet by mouth daily.     nitroGLYCERIN (NITROSTAT) 0.4 MG SL tablet Place 1 tablet under the tongue as needed.     predniSONE (STERAPRED UNI-PAK 48 TAB) 10 MG (48) TBPK tablet Take 1 tablet by mouth as directed.     sertraline (ZOLOFT) 25 MG tablet Take 50 mg by mouth daily as needed (ANXIETY).     SYNTHROID 175 MCG tablet Take 1 tablet (175 mcg total) by mouth daily. NO MORE REFILLS WITHOUT OFFICE VISIT - 2ND NOTICE (Patient taking differently: Take 175 mcg by mouth daily before breakfast. NO MORE REFILLS WITHOUT OFFICE VISIT - 2ND NOTICE) 15 tablet 0   docusate sodium (COLACE) 100 MG capsule Take 1 capsule (100 mg total) by mouth 2 (two) times daily.  (Patient not taking: No sig reported) 10 capsule 0   gabapentin (NEURONTIN) 300 MG capsule Take 300-600 mg by mouth 2 (two) times daily. Take 1 capsule (300 mg) in the morning & 2 capsules (600 mg) at night. (Patient not taking: Reported on 01/29/2021)     methocarbamol (ROBAXIN) 500 MG tablet Take 1 tablet (500 mg total) by mouth every 6 (six) hours as needed for muscle spasms. (Patient not taking: Reported on 01/29/2021) 60 tablet 0   No current facility-administered medications for this visit.    PHYSICAL EXAMINATION: ECOG PERFORMANCE STATUS: 0 - Asymptomatic  Vitals:   06/04/21 0944  BP: (!) 123/94  Pulse: 95  Resp: 18  SpO2: 100%   Filed Weights   06/04/21 0944  Weight: 229 lb 11.2 oz (104.2 kg)    Physical Exam Constitutional:      Appearance: Normal appearance.  HENT:     Head: Normocephalic and atraumatic.  Cardiovascular:     Rate and Rhythm: Regular rhythm.     Pulses: Normal pulses.     Heart sounds: Normal heart sounds.  Pulmonary:     Effort: Pulmonary effort is normal.     Breath sounds: Normal breath sounds.  Abdominal:     General: Abdomen is flat. Bowel sounds are normal.     Palpations: Abdomen is soft.  Musculoskeletal:        General: No swelling or tenderness. Normal range of motion.     Cervical back: Normal range of motion and neck supple. No rigidity or tenderness.  Lymphadenopathy:     Cervical: No cervical adenopathy.  Skin:    Findings: Rash (Maculopapular rash on the upper trunk noted on his back, associated with intense itching) present.  Neurological:     General: No focal deficit present.     Mental Status: He is alert.  Psychiatric:        Mood and  Affect: Mood normal.        Thought Content: Thought content normal.    LABORATORY DATA:  I have reviewed the data as listed Lab Results  Component Value Date   WBC 9.6 06/04/2021   HGB 12.1 (L) 06/04/2021   HCT 35.4 (L) 06/04/2021   MCV 106.9 (H) 06/04/2021   PLT 143 (L) 06/04/2021      Chemistry      Component Value Date/Time   NA 143 04/23/2021 1057   K 3.9 04/23/2021 1057   CL 106 04/23/2021 1057   CO2 28 04/23/2021 1057   BUN 8 04/23/2021 1057   CREATININE 0.97 04/23/2021 1057   CREATININE 1.22 07/13/2013 1253      Component Value Date/Time   CALCIUM 9.2 04/23/2021 1057   ALKPHOS 68 04/23/2021 1057   AST 39 04/23/2021 1057   ALT 29 04/23/2021 1057   BILITOT 0.4 04/23/2021 1057     Labs from today are pending.  RADIOGRAPHIC STUDIES: I have personally reviewed the radiological images as listed and agreed with the findings in the report. No results found.  All questions were answered. The patient knows to call the clinic with any problems, questions or concerns. I spent 20 minutes in the care of this patient including H and P, review of records, counseling and coordination of care. Thank you for consulting [place] in the care of this patient.  Please do not hesitate to contact us with any questions or concerns.    Benay Pike, MD 06/04/2021 10:45 AM

## 2021-06-04 ENCOUNTER — Other Ambulatory Visit: Payer: Self-pay

## 2021-06-04 ENCOUNTER — Inpatient Hospital Stay: Payer: 59

## 2021-06-04 ENCOUNTER — Encounter: Payer: Self-pay | Admitting: Hematology and Oncology

## 2021-06-04 ENCOUNTER — Telehealth: Payer: Self-pay | Admitting: Hematology and Oncology

## 2021-06-04 ENCOUNTER — Inpatient Hospital Stay: Payer: 59 | Attending: Hematology and Oncology | Admitting: Hematology and Oncology

## 2021-06-04 VITALS — BP 123/94 | HR 95 | Resp 18 | Wt 229.7 lb

## 2021-06-04 DIAGNOSIS — Z79899 Other long term (current) drug therapy: Secondary | ICD-10-CM | POA: Diagnosis not present

## 2021-06-04 DIAGNOSIS — E538 Deficiency of other specified B group vitamins: Secondary | ICD-10-CM | POA: Diagnosis not present

## 2021-06-04 DIAGNOSIS — D7219 Other eosinophilia: Secondary | ICD-10-CM

## 2021-06-04 DIAGNOSIS — D721 Eosinophilia, unspecified: Secondary | ICD-10-CM | POA: Insufficient documentation

## 2021-06-04 LAB — CBC WITH DIFFERENTIAL/PLATELET
Abs Immature Granulocytes: 0.1 10*3/uL — ABNORMAL HIGH (ref 0.00–0.07)
Basophils Absolute: 0 10*3/uL (ref 0.0–0.1)
Basophils Relative: 0 %
Eosinophils Absolute: 0.1 10*3/uL (ref 0.0–0.5)
Eosinophils Relative: 2 %
HCT: 35.4 % — ABNORMAL LOW (ref 39.0–52.0)
Hemoglobin: 12.1 g/dL — ABNORMAL LOW (ref 13.0–17.0)
Immature Granulocytes: 1 %
Lymphocytes Relative: 25 %
Lymphs Abs: 2.4 10*3/uL (ref 0.7–4.0)
MCH: 36.6 pg — ABNORMAL HIGH (ref 26.0–34.0)
MCHC: 34.2 g/dL (ref 30.0–36.0)
MCV: 106.9 fL — ABNORMAL HIGH (ref 80.0–100.0)
Monocytes Absolute: 1.3 10*3/uL — ABNORMAL HIGH (ref 0.1–1.0)
Monocytes Relative: 13 %
Neutro Abs: 5.7 10*3/uL (ref 1.7–7.7)
Neutrophils Relative %: 59 %
Platelets: 143 10*3/uL — ABNORMAL LOW (ref 150–400)
RBC: 3.31 MIL/uL — ABNORMAL LOW (ref 4.22–5.81)
RDW: 13.5 % (ref 11.5–15.5)
WBC: 9.6 10*3/uL (ref 4.0–10.5)
nRBC: 0 % (ref 0.0–0.2)

## 2021-06-04 LAB — IRON AND TIBC
Iron: 87 ug/dL (ref 42–163)
Saturation Ratios: 30 % (ref 20–55)
TIBC: 293 ug/dL (ref 202–409)
UIBC: 205 ug/dL (ref 117–376)

## 2021-06-04 LAB — FERRITIN: Ferritin: 177 ng/mL (ref 24–336)

## 2021-06-04 LAB — VITAMIN B12: Vitamin B-12: 211 pg/mL (ref 180–914)

## 2021-06-04 NOTE — Telephone Encounter (Signed)
Scheduled per los. Gave avs and calendar  

## 2021-06-05 ENCOUNTER — Telehealth: Payer: Self-pay

## 2021-06-05 NOTE — Telephone Encounter (Signed)
Spoke with patient regarding recent lab results and recommendations from Dr. Chryl Heck. Patient agreeable to continuing with B12 injections for additional 6 weeks. Patient aware of future follow-up visit in 6 weeks time to re-evaluate lab values. Patient is aware that our schedulers will be in touch with additional appointment.

## 2021-06-05 NOTE — Telephone Encounter (Signed)
-----   Message from Benay Pike, MD sent at 06/05/2021 11:18 AM EDT ----- After reviewing his blood work from yesterday, I think its reasonable to continue B 12 supplementation 1000 mcg injections once a week for another 6 weeks. Also he should come back for FU in 6 weeks to repeat labs,

## 2021-06-10 ENCOUNTER — Other Ambulatory Visit: Payer: Self-pay | Admitting: Hematology and Oncology

## 2021-06-10 ENCOUNTER — Telehealth: Payer: Self-pay | Admitting: Hematology and Oncology

## 2021-06-10 NOTE — Telephone Encounter (Signed)
Sch per 6/16 sch msg, left message

## 2021-07-01 ENCOUNTER — Other Ambulatory Visit: Payer: Self-pay | Admitting: Hematology and Oncology

## 2021-07-07 ENCOUNTER — Inpatient Hospital Stay (HOSPITAL_COMMUNITY)
Admission: EM | Admit: 2021-07-07 | Discharge: 2021-07-09 | DRG: 439 | Disposition: A | Discharge status: Home / Self Care | Payer: 59 | Attending: Student | Admitting: Student

## 2021-07-07 ENCOUNTER — Emergency Department (HOSPITAL_COMMUNITY): Payer: 59

## 2021-07-07 ENCOUNTER — Encounter (HOSPITAL_COMMUNITY): Payer: Self-pay | Admitting: Obstetrics and Gynecology

## 2021-07-07 ENCOUNTER — Other Ambulatory Visit: Payer: Self-pay

## 2021-07-07 DIAGNOSIS — Z7982 Long term (current) use of aspirin: Secondary | ICD-10-CM | POA: Diagnosis not present

## 2021-07-07 DIAGNOSIS — K219 Gastro-esophageal reflux disease without esophagitis: Secondary | ICD-10-CM | POA: Diagnosis present

## 2021-07-07 DIAGNOSIS — K859 Acute pancreatitis without necrosis or infection, unspecified: Secondary | ICD-10-CM

## 2021-07-07 DIAGNOSIS — Z20822 Contact with and (suspected) exposure to covid-19: Secondary | ICD-10-CM | POA: Diagnosis present

## 2021-07-07 DIAGNOSIS — E876 Hypokalemia: Secondary | ICD-10-CM | POA: Diagnosis present

## 2021-07-07 DIAGNOSIS — M1990 Unspecified osteoarthritis, unspecified site: Secondary | ICD-10-CM | POA: Diagnosis present

## 2021-07-07 DIAGNOSIS — D7589 Other specified diseases of blood and blood-forming organs: Secondary | ICD-10-CM | POA: Diagnosis present

## 2021-07-07 DIAGNOSIS — J302 Other seasonal allergic rhinitis: Secondary | ICD-10-CM | POA: Diagnosis present

## 2021-07-07 DIAGNOSIS — F419 Anxiety disorder, unspecified: Secondary | ICD-10-CM | POA: Diagnosis present

## 2021-07-07 DIAGNOSIS — Z87891 Personal history of nicotine dependence: Secondary | ICD-10-CM

## 2021-07-07 DIAGNOSIS — R21 Rash and other nonspecific skin eruption: Secondary | ICD-10-CM | POA: Diagnosis present

## 2021-07-07 DIAGNOSIS — F1010 Alcohol abuse, uncomplicated: Secondary | ICD-10-CM | POA: Diagnosis present

## 2021-07-07 DIAGNOSIS — E860 Dehydration: Secondary | ICD-10-CM | POA: Diagnosis present

## 2021-07-07 DIAGNOSIS — Z79899 Other long term (current) drug therapy: Secondary | ICD-10-CM

## 2021-07-07 DIAGNOSIS — E890 Postprocedural hypothyroidism: Secondary | ICD-10-CM | POA: Diagnosis present

## 2021-07-07 DIAGNOSIS — I10 Essential (primary) hypertension: Secondary | ICD-10-CM | POA: Diagnosis present

## 2021-07-07 DIAGNOSIS — Z882 Allergy status to sulfonamides status: Secondary | ICD-10-CM

## 2021-07-07 DIAGNOSIS — D6959 Other secondary thrombocytopenia: Secondary | ICD-10-CM | POA: Diagnosis present

## 2021-07-07 DIAGNOSIS — E039 Hypothyroidism, unspecified: Secondary | ICD-10-CM | POA: Diagnosis not present

## 2021-07-07 DIAGNOSIS — N179 Acute kidney failure, unspecified: Secondary | ICD-10-CM

## 2021-07-07 DIAGNOSIS — I959 Hypotension, unspecified: Secondary | ICD-10-CM | POA: Diagnosis present

## 2021-07-07 DIAGNOSIS — Z6833 Body mass index (BMI) 33.0-33.9, adult: Secondary | ICD-10-CM

## 2021-07-07 DIAGNOSIS — E669 Obesity, unspecified: Secondary | ICD-10-CM | POA: Diagnosis present

## 2021-07-07 DIAGNOSIS — Z885 Allergy status to narcotic agent status: Secondary | ICD-10-CM

## 2021-07-07 DIAGNOSIS — F329 Major depressive disorder, single episode, unspecified: Secondary | ICD-10-CM | POA: Diagnosis present

## 2021-07-07 DIAGNOSIS — Z9119 Patient's noncompliance with other medical treatment and regimen: Secondary | ICD-10-CM

## 2021-07-07 DIAGNOSIS — Z96643 Presence of artificial hip joint, bilateral: Secondary | ICD-10-CM | POA: Diagnosis present

## 2021-07-07 DIAGNOSIS — K852 Alcohol induced acute pancreatitis without necrosis or infection: Principal | ICD-10-CM | POA: Diagnosis present

## 2021-07-07 DIAGNOSIS — Z7952 Long term (current) use of systemic steroids: Secondary | ICD-10-CM

## 2021-07-07 DIAGNOSIS — Z79890 Hormone replacement therapy (postmenopausal): Secondary | ICD-10-CM

## 2021-07-07 DIAGNOSIS — Z7141 Alcohol abuse counseling and surveillance of alcoholic: Secondary | ICD-10-CM

## 2021-07-07 LAB — LACTIC ACID, PLASMA
Lactic Acid, Venous: 1.9 mmol/L (ref 0.5–1.9)
Lactic Acid, Venous: 1.4 mmol/L (ref 0.5–1.9)

## 2021-07-07 LAB — COMPREHENSIVE METABOLIC PANEL
ALT: 23 U/L (ref 0–44)
AST: 31 U/L (ref 15–41)
Albumin: 4.5 g/dL (ref 3.5–5.0)
Alkaline Phosphatase: 55 U/L (ref 38–126)
Anion gap: 12 (ref 5–15)
BUN: 22 mg/dL — ABNORMAL HIGH (ref 6–20)
CO2: 23 mmol/L (ref 22–32)
Calcium: 10.9 mg/dL — ABNORMAL HIGH (ref 8.9–10.3)
Chloride: 102 mmol/L (ref 98–111)
Creatinine, Ser: 6.07 mg/dL — ABNORMAL HIGH (ref 0.61–1.24)
GFR, Estimated: 10 mL/min — ABNORMAL LOW (ref >60)
Glucose, Bld: 105 mg/dL — ABNORMAL HIGH (ref 70–99)
Potassium: 3.6 mmol/L (ref 3.5–5.1)
Sodium: 137 mmol/L (ref 135–145)
Total Bilirubin: 0.4 mg/dL (ref 0.3–1.2)
Total Protein: 7.3 g/dL (ref 6.5–8.1)

## 2021-07-07 LAB — RESP PANEL BY RT-PCR (FLU A&B, COVID) ARPGX2
Influenza A by PCR: NEGATIVE
Influenza B by PCR: NEGATIVE
SARS Coronavirus 2 by RT PCR: NEGATIVE

## 2021-07-07 LAB — LIPASE, BLOOD: Lipase: 48 U/L (ref 11–51)

## 2021-07-07 LAB — CBC WITH DIFFERENTIAL/PLATELET
Abs Immature Granulocytes: 0.06 10*3/uL (ref 0.00–0.07)
Basophils Absolute: 0.1 10*3/uL (ref 0.0–0.1)
Basophils Relative: 0 %
Eosinophils Absolute: 0.3 10*3/uL (ref 0.0–0.5)
Eosinophils Relative: 2 %
HCT: 41.8 % (ref 39.0–52.0)
Hemoglobin: 14.5 g/dL (ref 13.0–17.0)
Immature Granulocytes: 1 %
Lymphocytes Relative: 12 %
Lymphs Abs: 1.4 10*3/uL (ref 0.7–4.0)
MCH: 37.9 pg — ABNORMAL HIGH (ref 26.0–34.0)
MCHC: 34.7 g/dL (ref 30.0–36.0)
MCV: 109.1 fL — ABNORMAL HIGH (ref 80.0–100.0)
Monocytes Absolute: 0.8 10*3/uL (ref 0.1–1.0)
Monocytes Relative: 7 %
Neutro Abs: 9 10*3/uL — ABNORMAL HIGH (ref 1.7–7.7)
Neutrophils Relative %: 78 %
Platelets: 182 10*3/uL (ref 150–400)
RBC: 3.83 MIL/uL — ABNORMAL LOW (ref 4.22–5.81)
RDW: 14.9 % (ref 11.5–15.5)
WBC: 11.5 10*3/uL — ABNORMAL HIGH (ref 4.0–10.5)
nRBC: 0 % (ref 0.0–0.2)

## 2021-07-07 LAB — VITAMIN B12: Vitamin B-12: 205 pg/mL (ref 180–914)

## 2021-07-07 LAB — TRIGLYCERIDES: Triglycerides: 336 mg/dL — ABNORMAL HIGH (ref <150)

## 2021-07-07 MED ORDER — THIAMINE HCL 100 MG PO TABS
100.0000 mg | ORAL_TABLET | Freq: Every day | ORAL | Status: DC
  Administered 2021-07-07 – 2021-07-09 (×3): 100 mg via ORAL
  Filled 2021-07-07 (×3): qty 1

## 2021-07-07 MED ORDER — DIPHENHYDRAMINE-APAP (SLEEP) 25-500 MG PO TABS: 2.0000 | ORAL_TABLET | Freq: Every day | ORAL | Status: DC

## 2021-07-07 MED ORDER — SODIUM CHLORIDE 0.9 % IV BOLUS
500.0000 mL | Freq: Once | INTRAVENOUS | Status: AC
  Administered 2021-07-07: 500 mL via INTRAVENOUS

## 2021-07-07 MED ORDER — LACTATED RINGERS IV SOLN: INTRAVENOUS | Status: DC

## 2021-07-07 MED ORDER — SERTRALINE HCL 50 MG PO TABS
50.0000 mg | ORAL_TABLET | Freq: Every day | ORAL | Status: DC
  Administered 2021-07-07 – 2021-07-08 (×2): 50 mg via ORAL
  Filled 2021-07-07 (×2): qty 1

## 2021-07-07 MED ORDER — ATORVASTATIN CALCIUM 40 MG PO TABS
80.0000 mg | ORAL_TABLET | Freq: Every day | ORAL | Status: DC
  Administered 2021-07-07 – 2021-07-09 (×3): 80 mg via ORAL
  Filled 2021-07-07 (×3): qty 2

## 2021-07-07 MED ORDER — GABAPENTIN 300 MG PO CAPS
300.0000 mg | ORAL_CAPSULE | Freq: Two times a day (BID) | ORAL | Status: DC
Start: 2021-07-07 — End: 2021-07-07

## 2021-07-07 MED ORDER — DIPHENHYDRAMINE-ZINC ACETATE 2-0.1 % EX CREA
TOPICAL_CREAM | CUTANEOUS | Status: DC | PRN
  Administered 2021-07-07: 1 via TOPICAL
  Filled 2021-07-07: qty 28

## 2021-07-07 MED ORDER — LORAZEPAM 1 MG PO TABS: 1.0000 mg | ORAL_TABLET | ORAL | Status: DC | PRN

## 2021-07-07 MED ORDER — LORATADINE 10 MG PO TABS
10.0000 mg | ORAL_TABLET | Freq: Every day | ORAL | Status: DC
Start: 2021-07-07 — End: 2021-07-09
  Administered 2021-07-07 – 2021-07-09 (×3): 10 mg via ORAL
  Filled 2021-07-07 (×3): qty 1

## 2021-07-07 MED ORDER — HEPARIN SODIUM (PORCINE) 5000 UNIT/ML IJ SOLN
5000.0000 [IU] | Freq: Three times a day (TID) | INTRAMUSCULAR | Status: DC
  Administered 2021-07-07 – 2021-07-09 (×5): 5000 [IU] via SUBCUTANEOUS
  Filled 2021-07-07 (×5): qty 1

## 2021-07-07 MED ORDER — THIAMINE HCL 100 MG/ML IJ SOLN: 100.0000 mg | Freq: Every day | INTRAMUSCULAR | Status: DC

## 2021-07-07 MED ORDER — GABAPENTIN 300 MG PO CAPS
600.0000 mg | ORAL_CAPSULE | Freq: Every day | ORAL | Status: DC
  Administered 2021-07-07 – 2021-07-08 (×2): 600 mg via ORAL
  Filled 2021-07-07 (×2): qty 2

## 2021-07-07 MED ORDER — LEVOTHYROXINE SODIUM 50 MCG PO TABS
175.0000 ug | ORAL_TABLET | Freq: Every day | ORAL | Status: DC
  Administered 2021-07-08 – 2021-07-09 (×2): 175 ug via ORAL
  Filled 2021-07-07 (×2): qty 1

## 2021-07-07 MED ORDER — LORAZEPAM 2 MG/ML IJ SOLN: 1.0000 mg | INTRAMUSCULAR | Status: DC | PRN

## 2021-07-07 MED ORDER — ADULT MULTIVITAMIN W/MINERALS CH
1.0000 | ORAL_TABLET | Freq: Every day | ORAL | Status: DC
  Administered 2021-07-07 – 2021-07-09 (×3): 1 via ORAL
  Filled 2021-07-07 (×3): qty 1

## 2021-07-07 MED ORDER — ACETAMINOPHEN 500 MG PO TABS
1000.0000 mg | ORAL_TABLET | Freq: Every day | ORAL | Status: DC
  Administered 2021-07-07 – 2021-07-08 (×2): 1000 mg via ORAL
  Filled 2021-07-07 (×2): qty 2

## 2021-07-07 MED ORDER — DIPHENHYDRAMINE HCL 25 MG PO CAPS
50.0000 mg | ORAL_CAPSULE | Freq: Every day | ORAL | Status: DC
  Administered 2021-07-07 – 2021-07-08 (×2): 50 mg via ORAL
  Filled 2021-07-07 (×2): qty 2

## 2021-07-07 MED ORDER — FOLIC ACID 1 MG PO TABS
1.0000 mg | ORAL_TABLET | Freq: Every day | ORAL | Status: DC
  Administered 2021-07-07 – 2021-07-09 (×3): 1 mg via ORAL
  Filled 2021-07-07 (×3): qty 1

## 2021-07-07 MED ORDER — PANTOPRAZOLE SODIUM 40 MG IV SOLR
40.0000 mg | Freq: Once | INTRAVENOUS | Status: AC
  Administered 2021-07-07: 40 mg via INTRAVENOUS
  Filled 2021-07-07: qty 40

## 2021-07-07 MED ORDER — GABAPENTIN 300 MG PO CAPS
300.0000 mg | ORAL_CAPSULE | Freq: Every day | ORAL | Status: DC
  Administered 2021-07-07 – 2021-07-09 (×3): 300 mg via ORAL
  Filled 2021-07-07 (×3): qty 1

## 2021-07-07 MED ORDER — SODIUM CHLORIDE 0.9 % IV BOLUS
1000.0000 mL | Freq: Once | INTRAVENOUS | Status: AC
  Administered 2021-07-07: 1000 mL via INTRAVENOUS

## 2021-07-07 NOTE — Progress Notes (Signed)
Chaplain responded to request for info on Advanced Directive. Patient was unsure what he was requesting and thought it was about HIPA info he wished shared with his daughter.  He says he has a completed Living Will at home so no work is further needed at this time..   Rev. Tamsen Snider Pager [phone]

## 2021-07-07 NOTE — H&P (Signed)
History and Physical    Philip Humphrey EXN:[id number] DOB: 1961/03/10 DOA: 07/07/2021  PCP: Kristie Cowman, MD  Patient coming from: home   Chief Complaint: low blood pressure  HPI: Philip Humphrey is a 60 y.o. male with medical history significant for hypothyroid, htn, heavy drinking, who presents with the above.  He reports about one week of intermittent nbnb emesis and loose stools. Last was yesterday. Also some epigastric abdominal pain that is now more in the left side and mild. No fevers or cough. No dysuria. Drinks a large glass of liquor a day. Denies history of pancreatitis. Has not been eating or drinking much. Noticed bp was low yesterday, in the 70s. Feeling a bit weak. Recently following with allergist for rash on back.   ED Course:   Ct shows pancreatitis. Labs remarkable for cr of 6. Bp maps 70s.  Review of Systems: As per HPI otherwise 10 point review of systems negative.    Past Medical History:  Diagnosis Date   Allergy    occ. seasonal   Anxiety    Arthritis    Colon polyp    Condyloma    anal   Depression    GERD (gastroesophageal reflux disease)    History of transfusion    as child   Hypertension    Knee injury    right knee cap w piece broken off- MRI done 02-06-15-pending surgery. 11-18-16 right knee is still painful, both shoulders(limited ROM right).   Knee pain    Neuropathy    bilateral- greater left   Palpitations    normal stress test 10 yrs ago   Pneumonia    Thyroid disease    Thyroid removed unintentionally at age 1- no further thyroid tissue remains-uses daily supplement    Past Surgical History:  Procedure Laterality Date   COLONOSCOPY     COLONOSCOPY N/A 02/15/2015   Procedure: COLONOSCOPY;  Surgeon: Alphonsa Overall, MD;  Location: [facility];  Service: General;  Laterality: N/A;   cyst removed     from neck / abdomed   EXAMINATION UNDER ANESTHESIA N/A 02/15/2015   Procedure: EXAM UNDER ANESTHESIA;  Surgeon: Pedro Earls, MD;   Location: [facility];  Service: General;  Laterality: N/A;   HERNIA REPAIR Left    10 yrs ago   KNEE ARTHROSCOPY Left    x1 and meniscal tear    ROTATOR CUFF REPAIR Right    x1   SHOULDER ARTHROSCOPY W/ ROTATOR CUFF REPAIR Left    THYROIDECTOMY  age 89    TOTAL HIP ARTHROPLASTY Left 11/24/2016   Procedure: LEFT TOTAL HIP ARTHROPLASTY ANTERIOR APPROACH;  Surgeon: Paralee Cancel, MD;  Location: [facility];  Service: Orthopedics;  Laterality: Left;   TOTAL HIP ARTHROPLASTY     Right hip  Dr. Alvan Dame 08/03/17   TOTAL HIP ARTHROPLASTY Right 08/03/2017   Procedure: RIGHT TOTAL HIP ARTHROPLASTY ANTERIOR APPROACH;  Surgeon: Paralee Cancel, MD;  Location: [facility];  Service: Orthopedics;  Laterality: Right;  70 mins   UMBILICAL HERNIA REPAIR N/A 10/18/2017   Procedure: Umbilical hernia repair  and excision of lipomas;  Surgeon: Johnathan Hausen, MD;  Location: [facility];  Service: General;  Laterality: N/A;   WART FULGURATION N/A 02/15/2015   Procedure: REMOVAL OF ANAL TAGS,CONDYLOMA;  Surgeon: Pedro Earls, MD;  Location: [facility];  Service: General;  Laterality: N/A;   WRIST SURGERY     x2- left (repair tendon/ ligament)     reports that he has quit smoking.  His smoking use included cigars. He has never used smokeless tobacco. He reports current alcohol use. He reports that he does not use drugs.  Allergies  Allergen Reactions   Sulfa Antibiotics Anaphylaxis and Swelling    MOUTH AND THROAT   Oxycodone Itching    Family History  Problem Relation Age of Onset   Heart disease Mother    Cancer Brother        Lung   Lung disease Brother    Stroke Father     Prior to Admission medications   Medication Sig Start Date End Date Taking? Authorizing Provider  albuterol (VENTOLIN HFA) 108 (90 Base) MCG/ACT inhaler Inhale 2 puffs into the lungs as needed.    [provider]  aspirin 325 MG tablet Take 325 mg by mouth daily.    [provider]  atorvastatin (LIPITOR) 80 MG tablet Take 80 mg by  mouth daily. 10/09/16   [provider]  calcium carbonate (TUMS - DOSED IN MG ELEMENTAL CALCIUM) 500 MG chewable tablet Chew 2 tablets by mouth 2 (two) times daily as needed for indigestion or heartburn.     [provider]  cyanocobalamin (,VITAMIN B-12,) 1000 MCG/ML injection INJECT 1 ML (1,000 MCG TOTAL) INTO THE MUSCLE ONCE A WEEK FOR 4 DOSES. 07/01/21 07/23/21  Benay Pike, MD  desloratadine (CLARINEX) 5 MG tablet Take 5 mg by mouth daily. 12/25/20   [provider]  diphenhydramine-acetaminophen (TYLENOL PM) 25-500 MG TABS tablet Take 2 tablets by mouth at bedtime.    [provider]  docusate sodium (COLACE) 100 MG capsule Take 1 capsule (100 mg total) by mouth 2 (two) times daily. Patient not taking: No sig reported 11/25/16   Danae Orleans, PA-C  famotidine (PEPCID) 40 MG tablet Take 40 mg by mouth daily. 01/25/21   [provider]  folic acid (FOLVITE) 1 MG tablet TAKE 1 TABLET BY MOUTH EVERY DAY 04/27/21   Benay Pike, MD  gabapentin (NEURONTIN) 300 MG capsule Take 300-600 mg by mouth 2 (two) times daily. Take 1 capsule (300 mg) in the morning & 2 capsules (600 mg) at night. Patient not taking: Reported on 01/29/2021    [provider]  HYDROcodone-acetaminophen (NORCO) 5-325 MG tablet Take 1 tablet by mouth every 4 (four) hours as needed for moderate pain. 10/18/17   Johnathan Hausen, MD  lisinopril (PRINIVIL,ZESTRIL) 20 MG tablet Take 20 mg by mouth daily. 10/09/16   [provider]  Magnesium 200 MG TABS Take 1 tablet by mouth daily.    [provider]  methocarbamol (ROBAXIN) 500 MG tablet Take 1 tablet (500 mg total) by mouth every 6 (six) hours as needed for muscle spasms. Patient not taking: Reported on 01/29/2021 08/04/17   [place], [employer], PA-C  nitroGLYCERIN (NITROSTAT) 0.4 MG SL tablet Place 1 tablet under the tongue as needed.    [provider]  sertraline (ZOLOFT) 25 MG tablet Take 50 mg by mouth  daily as needed (ANXIETY). 06/07/17   [provider]  SYNTHROID 175 MCG tablet Take 1 tablet (175 mcg total) by mouth daily. NO MORE REFILLS WITHOUT OFFICE VISIT - 2ND NOTICE Patient taking differently: Take 175 mcg by mouth daily before breakfast. NO MORE REFILLS WITHOUT OFFICE VISIT - 2ND NOTICE 11/14/14   Leandrew Koyanagi, MD    Physical Exam: Vitals:   07/07/21 1029 07/07/21 1119 07/07/21 1235 07/07/21 1314  BP:  (!) 91/59 (!) 94/57 91/63  Pulse:  (!) 101 92 96  Resp:  17 15 18   Temp:  98.1 F (36.7 C)    TempSrc:  Oral    SpO2:  100% 100% 99%  Weight: 104.3 kg     Height: 5\' 9"  (1.753 m)       Constitutional: No acute distress Head: Atraumatic Eyes: Conjunctiva clear ENM: Moist mucous membranes. Normal dentition.  Neck: Supple Respiratory: Clear to auscultation bilaterally, no wheezing/rales/rhonchi. Normal respiratory effort. No accessory muscle use. . Cardiovascular: Regular rate and rhythm. No murmurs/rubs/gallops. Abdomen: Non-tender, obese No masses. No rebound or guarding. Positive bowel sounds. Musculoskeletal: No joint deformity upper and lower extremities. Normal ROM, no contractures. Normal muscle tone.  Skin: No rashes, lesions, or ulcers.  Extremities: No peripheral edema. Palpable peripheral pulses. Neurologic: Alert, moving all 4 extremities. Psychiatric: Normal insight and judgement.   Labs on Admission: I have personally reviewed following labs and imaging studies  CBC: Recent Labs  Lab 07/07/21 1042  WBC 11.5*  NEUTROABS 9.0*  HGB 14.5  HCT 41.8  MCV 109.1*  PLT 277   Basic Metabolic Panel: Recent Labs  Lab 07/07/21 1042  NA 137  K 3.6  CL 102  CO2 23  GLUCOSE 105*  BUN 22*  CREATININE 6.07*  CALCIUM 10.9*   GFR: Estimated Creatinine Clearance: 15.4 mL/min (A) (by C-G formula based on SCr of 6.07 mg/dL (H)). Liver Function Tests: Recent Labs  Lab 07/07/21 1042  AST 31  ALT 23  ALKPHOS 55  BILITOT 0.4  PROT 7.3   ALBUMIN 4.5   Recent Labs  Lab 07/07/21 1356  LIPASE 48   No results for input(s): AMMONIA in the last 168 hours. Coagulation Profile: No results for input(s): INR, PROTIME in the last 168 hours. Cardiac Enzymes: No results for input(s): CKTOTAL, CKMB, CKMBINDEX, TROPONINI in the last 168 hours. BNP (last 3 results) No results for input(s): PROBNP in the last 8760 hours. HbA1C: No results for input(s): HGBA1C in the last 72 hours. CBG: No results for input(s): GLUCAP in the last 168 hours. Lipid Profile: No results for input(s): CHOL, HDL, LDLCALC, TRIG, CHOLHDL, LDLDIRECT in the last 72 hours. Thyroid Function Tests: No results for input(s): TSH, T4TOTAL, FREET4, T3FREE, THYROIDAB in the last 72 hours. Anemia Panel: No results for input(s): VITAMINB12, FOLATE, FERRITIN, TIBC, IRON, RETICCTPCT in the last 72 hours. Urine analysis: No results found for: COLORURINE, APPEARANCEUR, [place], [place], GLUCOSEU, HGBUR, BILIRUBINUR, KETONESUR, PROTEINUR, UROBILINOGEN, NITRITE, LEUKOCYTESUR  Radiological Exams on Admission: CT Abdomen Pelvis Wo Contrast  Result Date: 07/07/2021 CLINICAL DATA:  Abdominal pain, acute, nonlocalized. Severe headaches. EXAM: CT ABDOMEN AND PELVIS WITHOUT CONTRAST TECHNIQUE: Multidetector CT imaging of the abdomen and pelvis was performed following the standard protocol without IV contrast. COMPARISON:  None. FINDINGS: Lower chest: The lung bases are clear without focal nodule, mass, or airspace disease. Heart size is normal. Hepatobiliary: 8 mm hypodense lesion the right lobe of the liver likely represents a cyst on image 30 of series 2. No other focal hepatic lesions are present. The common bile duct and gallbladder are normal. Pancreas: Diffuse inflammatory changes are present pancreas. Focal mass or cyst is present. No focal fluid collection. Spleen: Normal in size without focal abnormality. Adrenals/Urinary Tract: Adrenal glands are normal bilaterally. Fluid  density lesion posteriorly in the left kidney measures 14 mm. No other focal hepatic lesions are present. No stone or mass lesion is present. Obstruction is present. Ureters are within normal limits. The urinary bladder is normal. Stomach/Bowel: Stomach and duodenum are within normal limits. Small bowel  is unremarkable. Terminal ileum is normal. The appendix is visualized and within normal limits. Ascending and transverse colon are normal. Descending and sigmoid colon are within normal limits. Vascular/Lymphatic: No significant vascular findings are present. No enlarged abdominal or pelvic lymph nodes. Reproductive: Prostate is unremarkable. Other: Fat herniates into the right inguinal canal without associated bowel. A small paraumbilical hernia contains fat. No other significant ventral hernias are present. No free fluid or free air is present. Musculoskeletal: Vertebral body heights.  T9 hemangioma noted. IMPRESSION: 1. Diffuse inflammatory changes compatible with acute pancreatitis. No complicating features are present. 2. Fat herniates into the right inguinal canal without associated bowel. 3. Small paraumbilical hernia contains fat. Electronically Signed   By: San Morelle M.D.   On: 07/07/2021 13:34   CT Head Wo Contrast  Result Date: 07/07/2021 CLINICAL DATA:  Dizziness.  Headache. EXAM: CT HEAD WITHOUT CONTRAST TECHNIQUE: Contiguous axial images were obtained from the base of the skull through the vertex without intravenous contrast. COMPARISON:  None. FINDINGS: Brain: No evidence of acute infarction, hemorrhage, hydrocephalus, extra-axial collection or mass lesion/mass effect. Vascular: No hyperdense vessel identified. Calcific intracranial atherosclerosis. Skull: No acute fracture. Sinuses/Orbits: Mild ethmoid air cell mucosal thickening without air-fluid levels. No acute orbital findings. Other: Small left mastoid effusion. IMPRESSION: 1. No evidence of acute intracranial abnormality. 2. Small  left mastoid effusion. Electronically Signed   By: Margaretha Sheffield MD   On: 07/07/2021 13:36   DG Chest Port 1 View  Result Date: 07/07/2021 CLINICAL DATA:  Weakness EXAM: PORTABLE CHEST 1 VIEW COMPARISON:  Radiograph 11/27/2004 FINDINGS: The heart size and mediastinal contours are within normal limits. No focal airspace disease. No pleural effusion or pneumothorax. Possible prior right distal clavicle excision. IMPRESSION: No evidence of acute cardiopulmonary disease. Electronically Signed   By: Maurine Simmering   On: 07/07/2021 12:39    EKG: Independently reviewed. Sinus tachycardia  Assessment/Plan Active Problems:   Hypothyroidism   HTN (hypertension)   Obese   Alcohol abuse   Pancreatitis   Acute kidney injury ([place])   Acute pancreatitis   # Pancreatitis # Hypotension Inflammatory changes seen on ct. Has had abd pain, nausea, and vomiting this week though currently is pain free. No vomiting or diarrhea today. Here with aki. No complications seen on ct imaging, no stone, either. Most likely 2/2 alcohol use. Hypotensive but mentating clearly, lactate wnl - npo sips w/ meds - fluids @ 250 - f/u triglycerides  # Acute kidney injury Cr 6 from baseline of wnl, likely prerenal from decreased po and nausea/vomiting. - fluids as above  # Alcohol abuse Denies hx complicated withdrawal. Does not appear to be withdrawing - ciwa ordered - folate/thiamine ordered - f/u hiv, hcv  # Macrocytosis Likely 2/2 etoh - f/u b12  # MDD - sertraline  # hypothyroid - home synthroid  # htn Here hypotensive - hold home lisinopril - cont statin  # Atopic eruption Followed outpt by allergist - close outpt f/u    DVT prophylaxis: heparin Code Status: full  Family Communication: daughter updated @ bedside  Consults called: none   Level of care: Med-Surg Status is: Inpatient  Remains inpatient appropriate because:Inpatient level of care appropriate due to severity of illness  Dispo:  The patient is from: Home              Anticipated d/c is to: Home              Patient currently is not medically stable to  d/c.   Difficult to place patient No        Desma Maxim MD [facility] Pager [phone]  If 7PM-7AM, please contact night-coverage [email or web address] Password [facility]  07/07/2021, 3:03 PM

## 2021-07-07 NOTE — ED Provider Notes (Signed)
[facility] DEPT Provider Note   CSN: [id number] Arrival date & time: 07/07/21  2130     History Chief Complaint  Patient presents with   Hypotension    Philip Humphrey is a 60 y.o. male with past medical history of hypothyroidism, hypertension, tobacco use and is s/p total right hip replacement presented to ED with hypotension. His blood pressure on arrival is 60/40. He endorsed headaches intermittently, along with blurry vision, dizziness, and nausea and diarrhea and abdominal pain. The symptom onset was 1 week ago. He noted dizziness with movement but later progressing to dizziness with watching fast movements. Patient endorsed adequate water intake but when asked to quantify it was around 1.5L. He does have alcohol use which he was unable to quantify but stated he was going through a hard time with his wife and friends passing away. Patient endorsed significant allergic history with allergies to grass, dogs, and oak tree along with cedar wood.        Past Medical History:  Diagnosis Date   Allergy    occ. seasonal   Anxiety    Arthritis    Colon polyp    Condyloma    anal   Depression    GERD (gastroesophageal reflux disease)    History of transfusion    as child   Hypertension    Knee injury    right knee cap w piece broken off- MRI done 02-06-15-pending surgery. 11-18-16 right knee is still painful, both shoulders(limited ROM right).   Knee pain    Neuropathy    bilateral- greater left   Palpitations    normal stress test 10 yrs ago   Pneumonia    Thyroid disease    Thyroid removed unintentionally at age 35- no further thyroid tissue remains-uses daily supplement    Patient Active Problem List   Diagnosis Date Noted   Status post total hip replacement, 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   Obese 11/25/2016   S/P left THA, AA 11/24/2016   Hypothyroidism 04/12/2012   HTN (hypertension) 04/12/2012   History of gastritis 04/12/2012   History of  tobacco use 04/12/2012   Overweight(278.02) 04/12/2012    Past Surgical History:  Procedure Laterality Date   COLONOSCOPY     COLONOSCOPY N/A 02/15/2015   Procedure: COLONOSCOPY;  Surgeon: Alphonsa Overall, MD;  Location: [facility];  Service: General;  Laterality: N/A;   cyst removed     from neck / abdomed   EXAMINATION UNDER ANESTHESIA N/A 02/15/2015   Procedure: EXAM UNDER ANESTHESIA;  Surgeon: Pedro Earls, MD;  Location: [facility];  Service: General;  Laterality: N/A;   HERNIA REPAIR Left    10 yrs ago   KNEE ARTHROSCOPY Left    x1 and meniscal tear    ROTATOR CUFF REPAIR Right    x1   SHOULDER ARTHROSCOPY W/ ROTATOR CUFF REPAIR Left    THYROIDECTOMY  age 40    TOTAL HIP ARTHROPLASTY Left 11/24/2016   Procedure: LEFT TOTAL HIP ARTHROPLASTY ANTERIOR APPROACH;  Surgeon: Paralee Cancel, MD;  Location: [facility];  Service: Orthopedics;  Laterality: Left;   TOTAL HIP ARTHROPLASTY     Right hip  Dr. Alvan Dame 08/03/17   TOTAL HIP ARTHROPLASTY Right 08/03/2017   Procedure: RIGHT TOTAL HIP ARTHROPLASTY ANTERIOR APPROACH;  Surgeon: Paralee Cancel, MD;  Location: [facility];  Service: Orthopedics;  Laterality: Right;  70 mins   UMBILICAL HERNIA REPAIR N/A 10/18/2017   Procedure: Umbilical hernia repair  and excision of lipomas;  Surgeon:  Johnathan Hausen, MD;  Location: [facility];  Service: General;  Laterality: N/A;   WART FULGURATION N/A 02/15/2015   Procedure: REMOVAL OF ANAL TAGS,CONDYLOMA;  Surgeon: Pedro Earls, MD;  Location: [facility];  Service: General;  Laterality: N/A;   WRIST SURGERY     x2- left (repair tendon/ ligament)       Family History  Problem Relation Age of Onset   Heart disease Mother    Cancer Brother        Lung   Lung disease Brother    Stroke Father     Social History   Tobacco Use   Smoking status: Former    Types: Cigars   Smokeless tobacco: Never   Tobacco comments:    occasional quit 2012  Vaping Use   Vaping Use: Never used  Substance Use Topics   Alcohol use: Yes     Comment: OCCASIONALLY BEER   Drug use: No    Home Medications Prior to Admission medications   Medication Sig Start Date End Date Taking? Authorizing Provider  albuterol (VENTOLIN HFA) 108 (90 Base) MCG/ACT inhaler Inhale 2 puffs into the lungs as needed.    [provider]  aspirin 325 MG tablet Take 325 mg by mouth daily.    [provider]  atorvastatin (LIPITOR) 80 MG tablet Take 80 mg by mouth daily. 10/09/16   [provider]  calcium carbonate (TUMS - DOSED IN MG ELEMENTAL CALCIUM) 500 MG chewable tablet Chew 2 tablets by mouth 2 (two) times daily as needed for indigestion or heartburn.     [provider]  cyanocobalamin (,VITAMIN B-12,) 1000 MCG/ML injection INJECT 1 ML (1,000 MCG TOTAL) INTO THE MUSCLE ONCE A WEEK FOR 4 DOSES. 07/01/21 07/23/21  Benay Pike, MD  desloratadine (CLARINEX) 5 MG tablet Take 5 mg by mouth daily. 12/25/20   [provider]  diphenhydramine-acetaminophen (TYLENOL PM) 25-500 MG TABS tablet Take 2 tablets by mouth at bedtime.    [provider]  docusate sodium (COLACE) 100 MG capsule Take 1 capsule (100 mg total) by mouth 2 (two) times daily. Patient not taking: No sig reported 11/25/16   Danae Orleans, PA-C  famotidine (PEPCID) 40 MG tablet Take 40 mg by mouth daily. 01/25/21   [provider]  folic acid (FOLVITE) 1 MG tablet TAKE 1 TABLET BY MOUTH EVERY DAY 04/27/21   Benay Pike, MD  gabapentin (NEURONTIN) 300 MG capsule Take 300-600 mg by mouth 2 (two) times daily. Take 1 capsule (300 mg) in the morning & 2 capsules (600 mg) at night. Patient not taking: Reported on 01/29/2021    [provider]  HYDROcodone-acetaminophen (NORCO) 5-325 MG tablet Take 1 tablet by mouth every 4 (four) hours as needed for moderate pain. 10/18/17   Johnathan Hausen, MD  lisinopril (PRINIVIL,ZESTRIL) 20 MG tablet Take 20 mg by mouth daily. 10/09/16   [provider]  Magnesium 200 MG TABS Take 1  tablet by mouth daily.    [provider]  methocarbamol (ROBAXIN) 500 MG tablet Take 1 tablet (500 mg total) by mouth every 6 (six) hours as needed for muscle spasms. Patient not taking: Reported on 01/29/2021 08/04/17   [place], [employer], PA-C  nitroGLYCERIN (NITROSTAT) 0.4 MG SL tablet Place 1 tablet under the tongue as needed.    [provider]  predniSONE (STERAPRED UNI-PAK 48 TAB) 10 MG (48) TBPK tablet Take 1 tablet by mouth as directed. 01/18/21   [provider]  sertraline (ZOLOFT) 25  MG tablet Take 50 mg by mouth daily as needed (ANXIETY). 06/07/17   [provider]  SYNTHROID 175 MCG tablet Take 1 tablet (175 mcg total) by mouth daily. NO MORE REFILLS WITHOUT OFFICE VISIT - 2ND NOTICE Patient taking differently: Take 175 mcg by mouth daily before breakfast. NO MORE REFILLS WITHOUT OFFICE VISIT - 2ND NOTICE 11/14/14   Leandrew Koyanagi, MD    Allergies    Sulfa antibiotics and Oxycodone  Review of Systems   Review of Systems  Constitutional:  Negative for chills and fever.  HENT:  Negative for ear pain and sore throat.   Eyes:  Positive for visual disturbance. Negative for pain.  Respiratory:  Negative for cough and shortness of breath.   Cardiovascular:  Negative for chest pain and palpitations.  Gastrointestinal:  Positive for abdominal pain (diffuse), diarrhea, nausea and vomiting.  Genitourinary:  Negative for dysuria and hematuria.  Musculoskeletal:  Negative for arthralgias and back pain.  Skin:  Positive for rash (upper back). Negative for color change.  Neurological:  Positive for dizziness and headaches (intermittent). Negative for seizures and syncope.  Psychiatric/Behavioral:  Negative for agitation and behavioral problems.   All other systems reviewed and are negative.  Physical Exam Updated Vital Signs BP 91/63 (BP Location: Right Arm)   Pulse 96   Temp 98.1 F (36.7 C) (Oral)   Resp 18   Ht 5\' 9"  (1.753 m)   Wt 104.3 kg    SpO2 99%   BMI 33.97 kg/m   Physical Exam Vitals and nursing note reviewed.  Constitutional:      Appearance: He is well-developed.  HENT:     Head: Normocephalic and atraumatic.     Right Ear: External ear normal.     Left Ear: External ear normal.     Mouth/Throat:     Mouth: Mucous membranes are moist.     Pharynx: Oropharynx is clear. No oropharyngeal exudate or posterior oropharyngeal erythema.  Eyes:     Conjunctiva/sclera: Conjunctivae normal.     Pupils: Pupils are equal, round, and reactive to light.  Cardiovascular:     Rate and Rhythm: Normal rate and regular rhythm.     Heart sounds: No murmur heard. Pulmonary:     Effort: Pulmonary effort is normal. No respiratory distress.     Breath sounds: Normal breath sounds.  Abdominal:     General: Bowel sounds are normal.     Palpations: Abdomen is soft.     Tenderness: There is abdominal tenderness (tender to palpation).  Musculoskeletal:        General: No swelling or tenderness. Normal range of motion.     Cervical back: Normal range of motion and neck supple.  Skin:    General: Skin is warm and dry.     Capillary Refill: Capillary refill takes more than 3 seconds.     Findings: Rash (rash on upper back) present.  Neurological:     General: No focal deficit present.     Mental Status: He is alert and oriented to person, place, and time.  Psychiatric:        Mood and Affect: Mood normal.        Behavior: Behavior normal.    ED Results / Procedures / Treatments   Labs (all labs ordered are listed, but only abnormal results are displayed) Labs Reviewed  COMPREHENSIVE METABOLIC PANEL - Abnormal; Notable for the following components:      Result Value   Glucose, Bld 105 (*)  BUN 22 (*)    Creatinine, Ser 6.07 (*)    Calcium 10.9 (*)    GFR, Estimated 10 (*)    All other components within normal limits  CBC WITH DIFFERENTIAL/PLATELET - Abnormal; Notable for the following components:   WBC 11.5 (*)    RBC  3.83 (*)    MCV 109.1 (*)    MCH 37.9 (*)    Neutro Abs 9.0 (*)    All other components within normal limits  RESP PANEL BY RT-PCR (FLU A&B, COVID) ARPGX2  CULTURE, BLOOD (ROUTINE X 2)  CULTURE, BLOOD (ROUTINE X 2)  LACTIC ACID, PLASMA  LIPASE, BLOOD  LACTIC ACID, PLASMA  URINALYSIS, ROUTINE W REFLEX MICROSCOPIC  ROCKY MTN SPOTTED FVR ABS PNL(IGG+IGM)    EKG EKG Interpretation  Date/Time:  Monday July 07 2021 11:18:29 EDT Ventricular Rate:  101 PR Interval:  158 QRS Duration: 75 QT Interval:  395 QTC Calculation: 512 R Axis:   170 Text Interpretation: Right and left arm electrode reversal, interpretation assumes no reversal Sinus tachycardia Right axis deviation Nonspecific T abnormalities, lateral leads Prolonged QT interval Confirmed by Malvin Johns [phone]) on 07/07/2021 11:38:24 AM  Radiology CT Abdomen Pelvis Wo Contrast  Result Date: 07/07/2021 CLINICAL DATA:  Abdominal pain, acute, nonlocalized. Severe headaches. EXAM: CT ABDOMEN AND PELVIS WITHOUT CONTRAST TECHNIQUE: Multidetector CT imaging of the abdomen and pelvis was performed following the standard protocol without IV contrast. COMPARISON:  None. FINDINGS: Lower chest: The lung bases are clear without focal nodule, mass, or airspace disease. Heart size is normal. Hepatobiliary: 8 mm hypodense lesion the right lobe of the liver likely represents a cyst on image 30 of series 2. No other focal hepatic lesions are present. The common bile duct and gallbladder are normal. Pancreas: Diffuse inflammatory changes are present pancreas. Focal mass or cyst is present. No focal fluid collection. Spleen: Normal in size without focal abnormality. Adrenals/Urinary Tract: Adrenal glands are normal bilaterally. Fluid density lesion posteriorly in the left kidney measures 14 mm. No other focal hepatic lesions are present. No stone or mass lesion is present. Obstruction is present. Ureters are within normal limits. The urinary bladder is  normal. Stomach/Bowel: Stomach and duodenum are within normal limits. Small bowel is unremarkable. Terminal ileum is normal. The appendix is visualized and within normal limits. Ascending and transverse colon are normal. Descending and sigmoid colon are within normal limits. Vascular/Lymphatic: No significant vascular findings are present. No enlarged abdominal or pelvic lymph nodes. Reproductive: Prostate is unremarkable. Other: Fat herniates into the right inguinal canal without associated bowel. A small paraumbilical hernia contains fat. No other significant ventral hernias are present. No free fluid or free air is present. Musculoskeletal: Vertebral body heights.  T9 hemangioma noted. IMPRESSION: 1. Diffuse inflammatory changes compatible with acute pancreatitis. No complicating features are present. 2. Fat herniates into the right inguinal canal without associated bowel. 3. Small paraumbilical hernia contains fat. Electronically Signed   By: San Morelle M.D.   On: 07/07/2021 13:34   CT Head Wo Contrast  Result Date: 07/07/2021 CLINICAL DATA:  Dizziness.  Headache. EXAM: CT HEAD WITHOUT CONTRAST TECHNIQUE: Contiguous axial images were obtained from the base of the skull through the vertex without intravenous contrast. COMPARISON:  None. FINDINGS: Brain: No evidence of acute infarction, hemorrhage, hydrocephalus, extra-axial collection or mass lesion/mass effect. Vascular: No hyperdense vessel identified. Calcific intracranial atherosclerosis. Skull: No acute fracture. Sinuses/Orbits: Mild ethmoid air cell mucosal thickening without air-fluid levels. No acute orbital findings. Other: Small left  mastoid effusion. IMPRESSION: 1. No evidence of acute intracranial abnormality. 2. Small left mastoid effusion. Electronically Signed   By: Margaretha Sheffield MD   On: 07/07/2021 13:36   DG Chest Port 1 View  Result Date: 07/07/2021 CLINICAL DATA:  Weakness EXAM: PORTABLE CHEST 1 VIEW COMPARISON:   Radiograph 11/27/2004 FINDINGS: The heart size and mediastinal contours are within normal limits. No focal airspace disease. No pleural effusion or pneumothorax. Possible prior right distal clavicle excision. IMPRESSION: No evidence of acute cardiopulmonary disease. Electronically Signed   By: Maurine Simmering   On: 07/07/2021 12:39    Procedures Procedures   Medications Ordered in ED Medications  pantoprazole (PROTONIX) injection 40 mg (has no administration in time range)  sodium chloride 0.9 % bolus 500 mL (has no administration in time range)  sodium chloride 0.9 % bolus 1,000 mL (0 mLs Intravenous Stopped 07/07/21 1312)    ED Course  I have reviewed the triage vital signs and the nursing notes.  Pertinent labs & imaging results that were available during my care of the patient were reviewed by me and considered in my medical decision making (see chart for details).  Clinical Course as of 07/07/21 1445  Mon Jul 07, 2021  1130 Added another NS 1 L bolus [GK]  1356 CT of Abdomen showed findings concerning for pancreatitis; lipase pending  [GK]    Clinical Course User Index [GK] Idamae Schuller, MD   MDM Rules/Calculators/A&P                          Hypotension/Diarrhea/Nausea/DIzziness Patient presented with hypotension with bp of 60/40. His BP checked by his daughter was around 60/40 yesterday. He also has nausea, vomiting, diarrhea along with it. Ddx pancreatitis vs dehydration vs anaphylactic reaction vs GI infection. Patient most likely has pancreatitis given the CT findings of pancreas inflammation. His lipase was upper limit of normal meaning it may have some chronicity to it. Patient has alcohol use which he states he relies on to sleep. Dehydration is present but appears secondary to pancreatitis and GI losses and are not the primary driver of his symptoms. Anaphylactic reaction is less likely as his rash is chronic and he does not have have difficulty breathing or significant lip or  facial swelling. He endorsed mild lip swelling later during admission. GI infection is less likely as patient does not have a fever or chills and lactic acid was normal. He has nausea and diarrhea which can be better explained with pancreatitis and given his history and imaging findings appears as most likely diagnosis.   Plan: -Fluid resuscitation 2.5 L given before admission called -CBC, CMP -CT A/P  -Blood and urine cultures -[place] spotted fever  AKI Patient has AKI as his creatinine from the CMP was 6.0. This appears most likely prerenal due to hypotension.  Plan: -Fluid resuscitation -Monitor kidney function -Avoid nephrotoxic drugs -Renally dose medications Final Clinical Impression(s) / ED Diagnoses Final diagnoses:  None    Rx / DC Orders ED Discharge Orders     None        Idamae Schuller, MD 07/07/21 [place]    Malvin Johns, MD 07/07/21 [phone]

## 2021-07-08 DIAGNOSIS — E669 Obesity, unspecified: Secondary | ICD-10-CM

## 2021-07-08 DIAGNOSIS — E039 Hypothyroidism, unspecified: Secondary | ICD-10-CM

## 2021-07-08 DIAGNOSIS — E876 Hypokalemia: Secondary | ICD-10-CM

## 2021-07-08 DIAGNOSIS — N179 Acute kidney failure, unspecified: Secondary | ICD-10-CM

## 2021-07-08 DIAGNOSIS — I959 Hypotension, unspecified: Secondary | ICD-10-CM

## 2021-07-08 DIAGNOSIS — Z6833 Body mass index (BMI) 33.0-33.9, adult: Secondary | ICD-10-CM

## 2021-07-08 DIAGNOSIS — K852 Alcohol induced acute pancreatitis without necrosis or infection: Principal | ICD-10-CM

## 2021-07-08 LAB — MAGNESIUM: Magnesium: 1.4 mg/dL — ABNORMAL LOW (ref 1.7–2.4)

## 2021-07-08 LAB — BASIC METABOLIC PANEL
Anion gap: 6 (ref 5–15)
BUN: 21 mg/dL — ABNORMAL HIGH (ref 6–20)
CO2: 23 mmol/L (ref 22–32)
Calcium: 9.1 mg/dL (ref 8.9–10.3)
Chloride: 108 mmol/L (ref 98–111)
Creatinine, Ser: 4.91 mg/dL — ABNORMAL HIGH (ref 0.61–1.24)
GFR, Estimated: 13 mL/min — ABNORMAL LOW (ref >60)
Glucose, Bld: 93 mg/dL (ref 70–99)
Potassium: 3.4 mmol/L — ABNORMAL LOW (ref 3.5–5.1)
Sodium: 137 mmol/L (ref 135–145)

## 2021-07-08 LAB — FOLATE: Folate: 9.6 ng/mL (ref >5.9)

## 2021-07-08 LAB — LIPASE, BLOOD: Lipase: 31 U/L (ref 11–51)

## 2021-07-08 LAB — HIV ANTIBODY (ROUTINE TESTING W REFLEX)
HIV Screen 4th Generation wRfx: NONREACTIVE
HIV Screen 4th Generation wRfx: NONREACTIVE

## 2021-07-08 LAB — TRIGLYCERIDES: Triglycerides: 260 mg/dL — ABNORMAL HIGH (ref <150)

## 2021-07-08 LAB — HEPATITIS C ANTIBODY: HCV Ab: NONREACTIVE

## 2021-07-08 MED ORDER — MAGNESIUM SULFATE 2 GM/50ML IV SOLN
2.0000 g | Freq: Once | INTRAVENOUS | Status: AC
  Administered 2021-07-08: 2 g via INTRAVENOUS
  Filled 2021-07-08: qty 50

## 2021-07-08 MED ORDER — ALBUTEROL SULFATE (2.5 MG/3ML) 0.083% IN NEBU: 2.5000 mg | INHALATION_SOLUTION | Freq: Four times a day (QID) | RESPIRATORY_TRACT | Status: DC | PRN

## 2021-07-08 MED ORDER — DOXEPIN HCL 10 MG PO CAPS
10.0000 mg | ORAL_CAPSULE | Freq: Every day | ORAL | Status: DC
  Administered 2021-07-08: 10 mg via ORAL
  Filled 2021-07-08: qty 1

## 2021-07-08 MED ORDER — VITAMIN B-12 1000 MCG PO TABS
1000.0000 ug | ORAL_TABLET | Freq: Every day | ORAL | Status: DC
  Administered 2021-07-08 – 2021-07-09 (×2): 1000 ug via ORAL
  Filled 2021-07-08 (×2): qty 1

## 2021-07-08 MED ORDER — OLOPATADINE HCL 0.1 % OP SOLN
1.0000 [drp] | Freq: Two times a day (BID) | OPHTHALMIC | Status: DC | PRN
  Filled 2021-07-08: qty 5

## 2021-07-08 MED ORDER — POTASSIUM CHLORIDE CRYS ER 20 MEQ PO TBCR
40.0000 meq | EXTENDED_RELEASE_TABLET | ORAL | Status: AC
  Administered 2021-07-08 (×2): 40 meq via ORAL
  Filled 2021-07-08 (×2): qty 2

## 2021-07-08 MED ORDER — ALBUTEROL SULFATE HFA 108 (90 BASE) MCG/ACT IN AERS: 2.0000 | INHALATION_SPRAY | Freq: Four times a day (QID) | RESPIRATORY_TRACT | Status: DC | PRN

## 2021-07-08 MED ORDER — ACAMPROSATE CALCIUM 333 MG PO TBEC: 666.0000 mg | DELAYED_RELEASE_TABLET | Freq: Three times a day (TID) | ORAL | Status: DC

## 2021-07-08 MED ORDER — CYANOCOBALAMIN 1000 MCG/ML IJ SOLN
1000.0000 ug | Freq: Once | INTRAMUSCULAR | Status: AC
  Administered 2021-07-08: 1000 ug via INTRAMUSCULAR
  Filled 2021-07-08: qty 1

## 2021-07-08 MED ORDER — HYDROXYZINE HCL 25 MG PO TABS: 25.0000 mg | ORAL_TABLET | Freq: Three times a day (TID) | ORAL | Status: DC | PRN

## 2021-07-08 MED ORDER — MONTELUKAST SODIUM 10 MG PO TABS
10.0000 mg | ORAL_TABLET | Freq: Every day | ORAL | Status: DC
  Administered 2021-07-08 – 2021-07-09 (×2): 10 mg via ORAL
  Filled 2021-07-08 (×2): qty 1

## 2021-07-08 NOTE — TOC Initial Note (Signed)
Transition of Care [facility]) - Initial/Assessment Note    Patient Details  Name: Philip Humphrey MRN: [id number] Date of Birth: [birth year]  [facility] CM/SW Contact:    Lennart Pall, LCSW Phone Number: 07/08/2021, 3:11 PM  Clinical Narrative:                 Met with pt today to introduce self/ TOC role.  Very pleasant gentleman who has a good understanding of his medical issues and aware most are likely a result of his ETOH use/ abuse.  He talks openly with me about the past few years since his wife died (cancer) and the increasing ETOH use he has had.  He notes that he has had several other family and friend deaths over the past years as well as health issues and inability to continue to work any longer.  He reports ongoing depression since wife's death.  He did attend bereavement counseling for a time through [facility]) which he found helpful yet feels it is no longer the type of counseling support he needs.   Pt is very open to starting outpatient counseling for both of the issues at hand of SA and depression.  He is goal oriented to "get better" for his 61 yr old grandson and his daughter.  Will gather both counseling and SA resource information for him and will follow while here for any other potential TOC needs.  Expected Discharge Plan: Home/Self Care Barriers to Discharge: Continued Medical Work up   Patient Goals and CMS Choice Patient states their goals for this hospitalization and ongoing recovery are:: return home      Expected Discharge Plan and Services Expected Discharge Plan: Home/Self Care In-house Referral: Clinical Social Work     Living arrangements for the past 2 months: Single Family Home                                      Prior Living Arrangements/Services Living arrangements for the past 2 months: Single Family Home Lives with:: Self Patient language and need for interpreter reviewed:: Yes Do you feel safe going back to the  place where you live?: Yes      Need for Family Participation in Patient Care: No (Comment) Care giver support system in place?: Yes (comment)   Criminal Activity/Legal Involvement Pertinent to Current Situation/Hospitalization: No - Comment as needed  Activities of Daily Living Home Assistive Devices/Equipment: Shower chair with back, Environmental consultant (specify type), Eyeglasses, Cane (specify quad or straight) ADL Screening (condition at time of admission) Patient's cognitive ability adequate to safely complete daily activities?: No Is the patient deaf or have difficulty hearing?: No Does the patient have difficulty seeing, even when wearing glasses/contacts?: Yes (has vertigo) Does the patient have difficulty concentrating, remembering, or making decisions?: Yes (forgetful) Patient able to express need for assistance with ADLs?: Yes Does the patient have difficulty dressing or bathing?: Yes Independently performs ADLs?: No Communication: Independent Dressing (OT): Needs assistance Is this a change from baseline?: Pre-admission baseline Grooming: Independent Feeding: Independent Bathing: Needs assistance Is this a change from baseline?: Pre-admission baseline Toileting: Needs assistance Is this a change from baseline?: Pre-admission baseline In/Out Bed: Needs assistance Is this a change from baseline?: Pre-admission baseline Walks in Home: Needs assistance Is this a change from baseline?: Pre-admission baseline Does the patient have difficulty walking or climbing stairs?: Yes Weakness of Legs: Both Weakness of Arms/Hands:  Both  Permission Sought/Granted                  Emotional Assessment Appearance:: Appears stated age Attitude/Demeanor/Rapport: Engaged, Gracious Affect (typically observed): Accepting Orientation: : Oriented to Self, Oriented to Place, Oriented to  Time, Oriented to Situation Alcohol / Substance Use: Alcohol Use Psych Involvement: No (comment)  Admission  diagnosis:  Acute pancreatitis [K85.90] AKI (acute kidney injury) ([place]) [N17.9] Hypotension, unspecified hypotension type [I95.9] Alcohol-induced acute pancreatitis without infection or necrosis [K85.20] Patient Active Problem List   Diagnosis Date Noted   Alcohol abuse 07/07/2021   Pancreatitis 07/07/2021   Acute kidney injury ([place]) 07/07/2021   Acute pancreatitis 07/07/2021   Status post total hip replacement, 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   Obese 11/25/2016   S/P left THA, AA 11/24/2016   Hypothyroidism 04/12/2012   HTN (hypertension) 04/12/2012   History of gastritis 04/12/2012   History of tobacco use 04/12/2012   Overweight(278.02) 04/12/2012   PCP:  Kristie Cowman, MD Pharmacy:   [id number] [address], [place] [address] Phone: [phone] Fax: [phone]     Social Determinants of Health (SDOH) Interventions    Readmission Risk Interventions No flowsheet data found.

## 2021-07-08 NOTE — Progress Notes (Signed)
Mobility Specialist - Progress Note    07/08/21 1200  Mobility  Activity Ambulated in hall  Level of Assistance Independent  Assistive Device None  Distance Ambulated (ft) 500 ft  Mobility Ambulated independently in hallway  Mobility Response Tolerated well  Mobility performed by Mobility specialist  $Mobility charge 1 Mobility    Pt ambulated 500 ft in hallway with no assistive device. Pt did not c/o of SOB, pain, or dizziness and tolerated session well. Pt returned to bed with call bell at side and family in room.   [place] Specialist Acute Rehabilitation Services Phone: [phone] 07/08/21, 12:05 PM

## 2021-07-08 NOTE — Progress Notes (Signed)
PROGRESS NOTE  Philip Humphrey LPF:[id number] DOB: 10-10-1961   PCP: Kristie Cowman, MD  Patient is from: Home  DOA: 07/07/2021 LOS: 1  Chief complaints:  Chief Complaint  Patient presents with   Hypotension     Brief Narrative / Interim history: 60 year old M with PMH of EtOH abuse, HTN, depression and hypothyroidism presenting with 1 week of intermittent NBNB emesis, loose stool epigastric and LUQ pain, hypotension, and admitted for acute pancreatitis likely alcoholic, AKI and hypotension.  Cr 6.0 (normal baseline).  BUN 32.  K within normal.  Lipase within normal.  CT abdomen and pelvis concerning for acute pancreatitis without necrosis or pseudocyst.  Started on IV fluid and admitted.    Subjective: Seen and examined earlier this morning.  No major events overnight of this morning.  Symptoms seems to have resolved.  He denies chest pain, dyspnea, nausea, vomiting, abdominal pain or UTI symptoms.  He likes to try clear liquid diet today.  Patient's daughter at bedside.  She is concerned about his depression contributing to his alcohol abuse and asking for resources and psychiatry.   Objective: Vitals:   07/07/21 2134 07/08/21 0132 07/08/21 0521 07/08/21 0635  BP: 93/61 108/90 105/77   Pulse: 88 82 86   Resp: 16 18 18    Temp: 98 F (36.7 C) (!) 97.3 F (36.3 C) (!) 97.3 F (36.3 C) 97.8 F (36.6 C)  TempSrc: Oral Oral Oral   SpO2: 100% 100% 97%   Weight:      Height:        Intake/Output Summary (Last 24 hours) at 07/08/2021 1331 Last data filed at 07/08/2021 1000 Gross per 24 hour  Intake 3577.79 ml  Output 400 ml  Net 3177.79 ml   Filed Weights   07/07/21 1029  Weight: 104.3 kg    Examination:  GENERAL: No apparent distress.  Nontoxic. HEENT: MMM.  Vision and hearing grossly intact.  NECK: Supple.  No apparent JVD.  RESP: On RA.  No IWOB.  Fair aeration bilaterally. CVS:  RRR. Heart sounds normal.  ABD/GI/GU: BS+. Abd soft, NTND.  MSK/EXT:  Moves  extremities. No apparent deformity. No edema.  SKIN: no apparent skin lesion or wound NEURO: Awake, alert and oriented appropriately.  No apparent focal neuro deficit. PSYCH: Calm.  Denies SI or HI.    Procedures:  None.  Microbiology summarized: COVID-19 and influenza PCR nonreactive.  Assessment & Plan: Acute alcoholic pancreatitis without necrosis, infection or pseudocyst-improving.  Noted on CT abdomen and pelvis but lipase and LFT within normal.  TG slightly elevated but postprandial.  Pain seems to have resolved.  No nausea or vomiting.  Lipase remains normal. -Start clear liquid diet -Decrease IV fluid to 125 cc an hour -Encouraged alcohol cessation   AKI/azotemia: Likely in the setting of hypotension.  Lisinopril could contribute.  Improving. Recent Labs    01/29/21 1202 04/23/21 1057 07/07/21 1042 07/08/21 0411  BUN 15 8 22* 21*  CREATININE 0.86 0.97 6.07* 4.91*  -Continue IV fluid as above -Recheck in the morning   Hypotension/history of hypertension: Hypotension resolved. -Continue IV fluid as above -Continue holding antihypertensive meds.  Alcohol abuse: Drinks about a large glass of liquor every day since his wife passed away about 3 years ago.  Lives alone.  No prior history of complicated withdrawal.  No withdrawal symptoms -Encouraged alcohol cessation -TOC consulted for resources -Consider acamprosate once kidney function improves  Hypokalemia/hypomagnesemia: Likely due to alcohol and IV fluid. -Replenish and recheck  Macrocytosis: Likely  due to alcohol abuse.  Folate 9.6.  B12 205 -Vitamin B12 1000 mcg IM M x1 -P.o. vitamin B12 1000 mcg daily  MDD-Per patient's daughter, has been depressed since his wife passed away about 3 years ago.  Daughter is concerned that his depression might be contributing to his alcohol abuse.  He denies SI, HI or psychosis.  He is on low-dose Zoloft which seems to be managed by PCP -Continue home Zoloft, doxepin and  Atarax. -TOC consulted for outpatient psych referral  Hypothyroidism -Continue home Synthroid   Class I obesity Body mass index is 33.97 kg/m.         DVT prophylaxis:  heparin injection 5,000 Units Start: 07/07/21 2200  Code Status: Full code Family Communication: Patient and/or RN.  Updated patient's daughter at bedside Level of care: Med-Surg Status is: Inpatient  Remains inpatient appropriate because:IV treatments appropriate due to intensity of illness or inability to take PO and Inpatient level of care appropriate due to severity of illness  Dispo: The patient is from: Home              Anticipated d/c is to: Home              Patient currently is not medically stable to d/c.   Difficult to place patient No       Consultants:  None   Sch Meds:  Scheduled Meds:  diphenhydrAMINE  50 mg Oral QHS   And   acetaminophen  1,000 mg Oral QHS   atorvastatin  80 mg Oral Daily   folic acid  1 mg Oral Daily   gabapentin  300 mg Oral Daily   And   gabapentin  600 mg Oral QHS   heparin  5,000 Units Subcutaneous Q8H   levothyroxine  175 mcg Oral Daily   loratadine  10 mg Oral Daily   multivitamin with minerals  1 tablet Oral Daily   sertraline  50 mg Oral QHS   thiamine  100 mg Oral Daily   Or   thiamine  100 mg Intravenous Daily   vitamin B-12  1,000 mcg Oral Daily   Continuous Infusions:  lactated ringers 125 mL/hr at 07/08/21 1229   magnesium sulfate bolus IVPB 2 g (07/08/21 1233)   PRN Meds:.diphenhydrAMINE-zinc acetate, LORazepam **OR** LORazepam  Antimicrobials: Anti-infectives (From admission, onward)    None        I have personally reviewed the following labs and images: CBC: Recent Labs  Lab 07/07/21 1042  WBC 11.5*  NEUTROABS 9.0*  HGB 14.5  HCT 41.8  MCV 109.1*  PLT 182   BMP &GFR Recent Labs  Lab 07/07/21 1042 07/08/21 0411 07/08/21 0728  NA 137 137  --   K 3.6 3.4*  --   CL 102 108  --   CO2 23 23  --   GLUCOSE 105* 93   --   BUN 22* 21*  --   CREATININE 6.07* 4.91*  --   CALCIUM 10.9* 9.1  --   MG  --   --  1.4*   Estimated Creatinine Clearance: 19 mL/min (A) (by C-G formula based on SCr of 4.91 mg/dL (H)). Liver & Pancreas: Recent Labs  Lab 07/07/21 1042  AST 31  ALT 23  ALKPHOS 55  BILITOT 0.4  PROT 7.3  ALBUMIN 4.5   Recent Labs  Lab 07/07/21 1356 07/08/21 0728  LIPASE 48 31   No results for input(s): AMMONIA in the last 168 hours. Diabetic: No results for  input(s): HGBA1C in the last 72 hours. No results for input(s): GLUCAP in the last 168 hours. Cardiac Enzymes: No results for input(s): CKTOTAL, CKMB, CKMBINDEX, TROPONINI in the last 168 hours. No results for input(s): PROBNP in the last 8760 hours. Coagulation Profile: No results for input(s): INR, PROTIME in the last 168 hours. Thyroid Function Tests: No results for input(s): TSH, T4TOTAL, FREET4, T3FREE, THYROIDAB in the last 72 hours. Lipid Profile: Recent Labs    07/07/21 1356 07/08/21 0728  TRIG 336* 260*   Anemia Panel: Recent Labs    07/07/21 2034 07/08/21 0728  VITAMINB12 205  --   FOLATE  --  9.6   Urine analysis: No results found for: COLORURINE, APPEARANCEUR, LABSPEC, PHURINE, GLUCOSEU, HGBUR, BILIRUBINUR, KETONESUR, PROTEINUR, UROBILINOGEN, NITRITE, LEUKOCYTESUR Sepsis Labs: Invalid input(s): PROCALCITONIN, [place]  Microbiology: Recent Results (from the past 240 hour(s))  Culture, blood (routine x 2)     Status: None (Preliminary result)   Collection Time: 07/07/21 11:38 AM   Specimen: BLOOD  Result Value Ref Range Status   Specimen Description   Final    BLOOD RIGHT ANTECUBITAL Performed at [facility] [address], [place], [place] [address]    Special Requests   Final    BOTTLES DRAWN AEROBIC AND ANAEROBIC Blood Culture adequate volume Performed at [facility] [address], [place], [place] [address]    Culture   Final    NO GROWTH < 24  HOURS Performed at [facility] [address], [place], [place] [address]    Report Status PENDING  Incomplete  Culture, blood (routine x 2)     Status: None (Preliminary result)   Collection Time: 07/07/21  1:07 PM   Specimen: BLOOD  Result Value Ref Range Status   Specimen Description   Final    BLOOD SITE NOT SPECIFIED Performed at [facility] [address], [place], [place] [address]    Special Requests   Final    BOTTLES DRAWN AEROBIC AND ANAEROBIC Blood Culture adequate volume Performed at [facility] [address] [place], [place] [address]    Culture   Final    NO GROWTH < 24 HOURS Performed at [facility] [address] [place] [address] [place], [place] [address]    Report Status PENDING  Incomplete  Resp Panel by RT-PCR (Flu A&B, Covid) Nasopharyngeal Swab     Status: None   Collection Time: 07/07/21  1:12 PM   Specimen: Nasopharyngeal Swab; Nasopharyngeal(NP) swabs in vial transport medium  Result Value Ref Range Status   SARS Coronavirus 2 by RT PCR NEGATIVE NEGATIVE Final    Comment: (NOTE) SARS-CoV-2 target nucleic acids are NOT DETECTED.  The SARS-CoV-2 RNA is generally detectable in upper respiratory specimens during the acute phase of infection. The lowest concentration of SARS-CoV-2 viral copies this assay can detect is 138 copies/mL. A negative result does not preclude SARS-Cov-2 infection and should not be used as the sole basis for treatment or other patient management decisions. A negative result may occur with  improper specimen collection/handling, submission of specimen other than nasopharyngeal swab, presence of viral mutation(s) within the areas targeted by this assay, and inadequate number of viral copies(<138 copies/mL). A negative result must be combined with clinical observations, patient history, and epidemiological information. The expected result is Negative.  Fact Sheet for Patients:   EntrepreneurPulse.com.au  Fact Sheet for Healthcare Providers:  IncredibleEmployment.be  This test is no t yet approved or  cleared by the [place] and  has been authorized for detection and/or diagnosis of SARS-CoV-2 by FDA under an Emergency Use Authorization (EUA). This EUA will remain  in effect (meaning this test can be used) for the duration of the COVID-19 declaration under Section 564(b)(1) of the Act, 21 U.S.C.section 360bbb-3(b)(1), unless the authorization is terminated  or revoked sooner.       Influenza A by PCR NEGATIVE NEGATIVE Final   Influenza B by PCR NEGATIVE NEGATIVE Final    Comment: (NOTE) The Xpert Xpress SARS-CoV-2/FLU/RSV plus assay is intended as an aid in the diagnosis of influenza from Nasopharyngeal swab specimens and should not be used as a sole basis for treatment. Nasal washings and aspirates are unacceptable for Xpert Xpress SARS-CoV-2/FLU/RSV testing.  Fact Sheet for Patients: EntrepreneurPulse.com.au  Fact Sheet for Healthcare Providers: IncredibleEmployment.be  This test is not yet approved or cleared by the [place] FDA and has been authorized for detection and/or diagnosis of SARS-CoV-2 by FDA under an Emergency Use Authorization (EUA). This EUA will remain in effect (meaning this test can be used) for the duration of the COVID-19 declaration under Section 564(b)(1) of the Act, 21 U.S.C. section 360bbb-3(b)(1), unless the authorization is terminated or revoked.  Performed at [facility], [place] [address], [place], [place] [address]     Radiology Studies: No results found.    Meko Bellanger T. [place]  If 7PM-7AM, please contact night-coverage [email or web address] 07/08/2021, 1:31 PM

## 2021-07-09 DIAGNOSIS — I10 Essential (primary) hypertension: Secondary | ICD-10-CM

## 2021-07-09 DIAGNOSIS — Z9119 Patient's noncompliance with other medical treatment and regimen: Secondary | ICD-10-CM

## 2021-07-09 DIAGNOSIS — F329 Major depressive disorder, single episode, unspecified: Secondary | ICD-10-CM

## 2021-07-09 DIAGNOSIS — F1010 Alcohol abuse, uncomplicated: Secondary | ICD-10-CM

## 2021-07-09 LAB — COMPREHENSIVE METABOLIC PANEL
ALT: 19 U/L (ref 0–44)
AST: 25 U/L (ref 15–41)
Albumin: 3.2 g/dL — ABNORMAL LOW (ref 3.5–5.0)
Alkaline Phosphatase: 38 U/L (ref 38–126)
Anion gap: 7 (ref 5–15)
BUN: 14 mg/dL (ref 8–23)
CO2: 23 mmol/L (ref 22–32)
Calcium: 9.6 mg/dL (ref 8.9–10.3)
Chloride: 109 mmol/L (ref 98–111)
Creatinine, Ser: 1.58 mg/dL — ABNORMAL HIGH (ref 0.61–1.24)
GFR, Estimated: 49 mL/min — ABNORMAL LOW (ref >60)
Glucose, Bld: 90 mg/dL (ref 70–99)
Potassium: 4.2 mmol/L (ref 3.5–5.1)
Sodium: 139 mmol/L (ref 135–145)
Total Bilirubin: 0.5 mg/dL (ref 0.3–1.2)
Total Protein: 5.1 g/dL — ABNORMAL LOW (ref 6.5–8.1)

## 2021-07-09 LAB — CBC
HCT: 32.6 % — ABNORMAL LOW (ref 39.0–52.0)
Hemoglobin: 11 g/dL — ABNORMAL LOW (ref 13.0–17.0)
MCH: 37.8 pg — ABNORMAL HIGH (ref 26.0–34.0)
MCHC: 33.7 g/dL (ref 30.0–36.0)
MCV: 112 fL — ABNORMAL HIGH (ref 80.0–100.0)
Platelets: 97 10*3/uL — ABNORMAL LOW (ref 150–400)
RBC: 2.91 MIL/uL — ABNORMAL LOW (ref 4.22–5.81)
RDW: 14.6 % (ref 11.5–15.5)
WBC: 4.6 10*3/uL (ref 4.0–10.5)
nRBC: 0 % (ref 0.0–0.2)

## 2021-07-09 LAB — TSH: TSH: 58.62 u[IU]/mL — ABNORMAL HIGH (ref 0.350–4.500)

## 2021-07-09 LAB — LIPASE, BLOOD: Lipase: 29 U/L (ref 11–51)

## 2021-07-09 LAB — MAGNESIUM: Magnesium: 1.6 mg/dL — ABNORMAL LOW (ref 1.7–2.4)

## 2021-07-09 LAB — PHOSPHORUS: Phosphorus: 2.4 mg/dL — ABNORMAL LOW (ref 2.5–4.6)

## 2021-07-09 MED ORDER — ACETAMINOPHEN 325 MG PO TABS
650.0000 mg | ORAL_TABLET | Freq: Four times a day (QID) | ORAL | Status: DC | PRN
  Administered 2021-07-09: 650 mg via ORAL
  Filled 2021-07-09: qty 2

## 2021-07-09 MED ORDER — ASPIRIN EC 81 MG PO TBEC: 81.0000 mg | DELAYED_RELEASE_TABLET | Freq: Every day | ORAL | 0 refills | Status: AC

## 2021-07-09 MED ORDER — LISINOPRIL 20 MG PO TABS: 20.0000 mg | ORAL_TABLET | Freq: Every day | ORAL | Status: DC

## 2021-07-09 MED ORDER — MAGNESIUM SULFATE 2 GM/50ML IV SOLN
2.0000 g | Freq: Once | INTRAVENOUS | Status: AC
  Administered 2021-07-09: 2 g via INTRAVENOUS
  Filled 2021-07-09: qty 50

## 2021-07-09 MED ORDER — ACAMPROSATE CALCIUM 333 MG PO TBEC: 666.0000 mg | DELAYED_RELEASE_TABLET | Freq: Three times a day (TID) | ORAL | 0 refills | Status: DC

## 2021-07-09 MED ORDER — ACETAMINOPHEN 500 MG PO TABS: 500.0000 mg | ORAL_TABLET | Freq: Three times a day (TID) | ORAL | Status: AC | PRN

## 2021-07-09 NOTE — Progress Notes (Signed)
Discharge instructions given to patient and all questions were answered.  

## 2021-07-09 NOTE — TOC Transition Note (Signed)
Transition of Care [facility]) - CM/SW Discharge Note   Patient Details  Name: Philip Humphrey MRN: [id number] Date of Birth: December 18, 1961  [facility] CM/SW Contact:  Lennart Pall, LCSW Phone Number: 07/09/2021, 10:23 AM   Clinical Narrative:    Pt medically cleared for dc today if tolerating diet.  Provided written info on local counseling/ psychological offices within his network.  Also, provided local SA resources and talked about these options.  Pt seems to be motivated to address his health and mental health issues.     Final next level of care: Home/Self Care Barriers to Discharge: Barriers Resolved   Patient Goals and CMS Choice Patient states their goals for this hospitalization and ongoing recovery are:: return home      Discharge Placement                       Discharge Plan and Services In-house Referral: Clinical Social Work              DME Arranged: N/A DME Agency: NA                  Social Determinants of Health (SDOH) Interventions     Readmission Risk Interventions No flowsheet data found.

## 2021-07-09 NOTE — Discharge Summary (Signed)
Physician Discharge Summary  Philip Humphrey OEU:[id number] DOB: [birth year] DOA: 07/07/2021  PCP: Kristie Cowman, MD  Admit date: 07/07/2021 Discharge date: 07/09/2021  Admitted From: Home Disposition: Home  Recommendations for Outpatient Follow-up:  Follow ups as below. Please obtain CBC/BMP/Mag at follow up Check TSH in 4 to 6 weeks Consider ambulatory referral to psychiatry Please follow up on the following pending results: None  Home Health: Not indicated Equipment/Devices: Not indicated  Discharge Condition: Stable CODE STATUS: Full code   Follow-up Information     Kristie Cowman, MD. Schedule an appointment as soon as possible for a visit in 1 week(s).   Specialty: Family Medicine Contact information: [place] [address] [phone]                 Hospital Course: 60 year old M with PMH of EtOH abuse, HTN, depression and hypothyroidism presenting with 1 week of intermittent NBNB emesis, loose stool epigastric and LUQ pain, hypotension, and admitted for acute pancreatitis likely alcoholic, AKI and hypotension.  Cr 6.0 (normal baseline).  BUN 32.  K within normal.  Lipase within normal.  CT abdomen and pelvis concerning for acute pancreatitis without necrosis or pseudocyst.  Started on IV fluid and admitted. Patient remained free of GI symptoms since admission.  Tolerated soft diet without problem.  AKI improved tremendously with IV fluid.  Hypotension resolved as well.  TSH elevated to 58.  He admitted noncompliance with his Synthroid lately.  He is discharged to follow-up with PCP.  He has been counseled on the importance of taking his medication as prescribed, especially Synthroid.  He has been counseled on alcohol cessation/moderation and provided with resources.  He is encouraged to follow-up with PCP in 1 week to have his kidney numbers rechecked.  He also need repeat TSH in 4 to 6 weeks.  See individual from list below for more on hospital  course.  Discharge Diagnoses:  Acute alcoholic pancreatitis without necrosis, infection or pseudocyst-improving.  Noted on CT abdomen and pelvis but lipase and LFT within normal.  TG slightly elevated but postprandial, and improved on repeat.  Remained free of GI symptoms since admission.  Lipase remains normal.  Tolerated soft diet. -Encouraged alcohol cessation and provided with resources.     AKI/azotemia: Likely in the setting of hypotension.  Lisinopril could contribute.  Significant improvement Recent Labs    01/29/21 1202 04/23/21 1057 07/07/21 1042 07/08/21 0411 07/09/21 0357  BUN 15 8 22* 21* 14  CREATININE 0.86 0.97 6.07* 4.91* 1.58*  -Advised to hold his lisinopril for 1 week -Alcohol cessation counseling and resources as above -Repeat renal function in 1 to 2 weeks   Hypotension/history of hypertension: Hypotension resolved. -Holding lisinopril for 1 week in the setting of AKI   Alcohol abuse: Drinks about a large glass of liquor every day since his wife passed away about 3 years ago.  Lives alone.  No prior history of complicated withdrawal.  No withdrawal symptoms -Counseled on alcohol cessation and provided with resources. -Prescribed acamprosate  -Recommend ongoing counseling  Hypothyroidism: TSH 58.  Likely due to noncompliance with his Synthroid as he admits this. -Continue home Synthroid at 175 mcg daily -Counseled on the importance of compliance with his Synthroid -Recheck TSH in 4 to 6 weeks.   Hypokalemia/hypomagnesemia: Mg 1.6. -IV magnesium sulfate 2 g x 1 prior to discharge   Macrocytosis: Likely due to alcohol abuse.  Folate 9.6.  B12 205 -Vitamin B12 1000 mcg IM M x1 -P.o. vitamin  B12 1000 mcg daily   MDD-Per patient's daughter, has been depressed since his wife passed away about 3 years ago.  Daughter is concerned that his depression might be contributing to his alcohol abuse.  He denies SI, HI or psychosis.  He is on low-dose Zoloft which seems to  be managed by PCP. -He may benefit from outpatient psych follow-up.  However, I believe he is hypothyroidism needs to be corrected prior to adjusting his psych medications.  I had lengthy discussion about the importance of compliance with his Synthroid. -Continue home Zoloft, doxepin and Atarax.   Thrombocytopenia: Likely from alcohol. Recent Labs  Lab 07/07/21 1042 07/09/21 0357  PLT 182 97*  -Recheck CBC at follow-up.    Class I obesity Body mass index is 33.97 kg/m.  -Compliance with his Synthroid could help -Encourage lifestyle change to lose weight.     Family communication: Attempted to call patient's daughter for update but no answer.     Discharge Exam: Vitals:   07/08/21 2027 07/09/21 0531  BP: 119/87 (!) 129/98  Pulse: 86 81  Resp: 18 18  Temp: 97.8 F (36.6 C) (!) 97.5 F (36.4 C)  SpO2: 98% 100%    GENERAL: No apparent distress.  Nontoxic. HEENT: MMM.  Vision and hearing grossly intact.  NECK: Supple.  No apparent JVD.  RESP:  No IWOB.  Fair aeration bilaterally. CVS:  RRR. Heart sounds normal.  ABD/GI/GU: Bowel sounds present. Soft. Non tender.  MSK/EXT:  Moves extremities. No apparent deformity. No edema.  SKIN: no apparent skin lesion or wound NEURO: Awake, alert and oriented appropriately.  No apparent focal neuro deficit. PSYCH: Calm. Normal affect.  Discharge Instructions  Discharge Instructions     Call MD for:  difficulty breathing, headache or visual disturbances   Complete by: As directed    Call MD for:  extreme fatigue   Complete by: As directed    Call MD for:  persistant nausea and vomiting   Complete by: As directed    Call MD for:  severe uncontrolled pain   Complete by: As directed    Call MD for:  temperature >100.4   Complete by: As directed    Diet - low sodium heart healthy   Complete by: As directed    Discharge instructions   Complete by: As directed    It has been a pleasure taking care of you!  You were  hospitalized with hypotension and acute kidney injury likely from nausea, vomiting, loose stool and abdominal pain in the setting of acute pancreatitis.  Your pancreatitis could be due to alcohol.  Your symptoms resolved.  Your blood pressure and kidney functions improved.  It is very important that you stop drinking alcohol.  You may use the resources we have provided you for help with alcohol cessation.  You can also discuss treatment options with your primary care doctor.  We also noted that your thyroid level is very low likely from not taking your Synthroid.  It is very important that you taking Synthroid as prescribed and have your levels rechecked in about 4 to 6 weeks.  Please see your primary care doctor in 1 to 2 weeks to have your kidney levels rechecked.    Take care,   Increase activity slowly   Complete by: As directed       Allergies as of 07/09/2021       Reactions   Sulfa Antibiotics Anaphylaxis, Swelling   Mouth and throat swelling   Oxycodone  Itching        Medication List     STOP taking these medications    aspirin 325 MG tablet Replaced by: aspirin EC 81 MG tablet   cetirizine 10 MG tablet Commonly known as: ZYRTEC   diphenhydramine-acetaminophen 25-500 MG Tabs tablet Commonly known as: TYLENOL PM   methocarbamol 500 MG tablet Commonly known as: ROBAXIN       TAKE these medications    acamprosate 333 MG tablet Commonly known as: CAMPRAL Take 2 tablets (666 mg total) by mouth 3 (three) times daily with meals.   acetaminophen 500 MG tablet Commonly known as: TYLENOL Take 1 tablet (500 mg total) by mouth every 8 (eight) hours as needed for mild pain, fever or headache.   albuterol 108 (90 Base) MCG/ACT inhaler Commonly known as: VENTOLIN HFA Inhale 2 puffs into the lungs every 6 (six) hours as needed for wheezing or shortness of breath.   amoxicillin 500 MG capsule Commonly known as: AMOXIL Take 2,000 mg by mouth See admin instructions. Take 4  capsules (2000 mg) by mouth one hour prior to dental appointments   aspirin EC 81 MG tablet Take 1 tablet (81 mg total) by mouth daily. Swallow whole. Replaces: aspirin 325 MG tablet   atorvastatin 80 MG tablet Commonly known as: LIPITOR Take 80 mg by mouth daily.   bismuth subsalicylate 324 MW/10UV suspension Commonly known as: PEPTO BISMOL Take by mouth every 6 (six) hours as needed for indigestion (stomach pain).   calcium carbonate 500 MG chewable tablet Commonly known as: TUMS - dosed in mg elemental calcium Chew 2 tablets by mouth at bedtime as needed for indigestion or heartburn.   desloratadine 5 MG tablet Commonly known as: CLARINEX Take 10 mg by mouth daily.   diclofenac Sodium 1 % Gel Commonly known as: VOLTAREN Apply 1 application topically 2 (two) times daily as needed (pain).   doxepin 10 MG capsule Commonly known as: SINEQUAN Take 10 mg by mouth at bedtime.   famotidine 40 MG tablet Commonly known as: PEPCID Take 40 mg by mouth daily.   gabapentin 300 MG capsule Commonly known as: NEURONTIN Take 300-600 mg by mouth See admin instructions. Take 1 capsule (300 mg) in the morning & 2 capsules (600 mg) at night.   hydrOXYzine 25 MG tablet Commonly known as: ATARAX/VISTARIL Take 25 mg by mouth 2 (two) times daily. For itching   lisinopril 20 MG tablet Commonly known as: ZESTRIL Take 1 tablet (20 mg total) by mouth daily. Start taking on: July 16, 2021 What changed: These instructions start on July 16, 2021. If you are unsure what to do until then, ask your doctor or other care provider.   Magnesium 400 MG Tabs Take 400 mg by mouth daily.   meclizine 12.5 MG tablet Commonly known as: ANTIVERT Take 12.5 mg by mouth 2 (two) times daily as needed (vertigo).   montelukast 10 MG tablet Commonly known as: SINGULAIR Take 10 mg by mouth daily.   nitroGLYCERIN 0.4 MG SL tablet Commonly known as: NITROSTAT Place 0.4 mg under the tongue every 5 (five)  minutes as needed for chest pain.   PATADAY OP Place 1 drop into both eyes 2 (two) times daily as needed (itching/allergies).   sertraline 25 MG tablet Commonly known as: ZOLOFT Take 25 mg by mouth at bedtime.   Synthroid 175 MCG tablet Generic drug: levothyroxine Take 1 tablet (175 mcg total) by mouth daily. NO MORE REFILLS WITHOUT OFFICE VISIT - 2ND NOTICE What  changed:  when to take this additional instructions   vitamin B-12 1000 MCG tablet Commonly known as: CYANOCOBALAMIN Take 1,000 mcg by mouth daily.   Vitamin D (Ergocalciferol) 1.25 MG (50000 UNIT) Caps capsule Commonly known as: DRISDOL Take 50,000 Units by mouth once a week.       ASK your doctor about these medications    docusate sodium 100 MG capsule Commonly known as: COLACE Take 1 capsule (100 mg total) by mouth 2 (two) times daily.   folic acid 1 MG tablet Commonly known as: FOLVITE TAKE 1 TABLET BY MOUTH EVERY DAY   cyanocobalamin 1000 MCG/ML injection Commonly known as: (VITAMIN B-12) INJECT 1 ML (1,000 MCG TOTAL) INTO THE MUSCLE ONCE A WEEK FOR 4 DOSES.        Consultations: None  Procedures/Studies:   CT Abdomen Pelvis Wo Contrast  Result Date: 07/07/2021 CLINICAL DATA:  Abdominal pain, acute, nonlocalized. Severe headaches. EXAM: CT ABDOMEN AND PELVIS WITHOUT CONTRAST TECHNIQUE: Multidetector CT imaging of the abdomen and pelvis was performed following the standard protocol without IV contrast. COMPARISON:  None. FINDINGS: Lower chest: The lung bases are clear without focal nodule, mass, or airspace disease. Heart size is normal. Hepatobiliary: 8 mm hypodense lesion the right lobe of the liver likely represents a cyst on image 30 of series 2. No other focal hepatic lesions are present. The common bile duct and gallbladder are normal. Pancreas: Diffuse inflammatory changes are present pancreas. Focal mass or cyst is present. No focal fluid collection. Spleen: Normal in size without focal  abnormality. Adrenals/Urinary Tract: Adrenal glands are normal bilaterally. Fluid density lesion posteriorly in the left kidney measures 14 mm. No other focal hepatic lesions are present. No stone or mass lesion is present. Obstruction is present. Ureters are within normal limits. The urinary bladder is normal. Stomach/Bowel: Stomach and duodenum are within normal limits. Small bowel is unremarkable. Terminal ileum is normal. The appendix is visualized and within normal limits. Ascending and transverse colon are normal. Descending and sigmoid colon are within normal limits. Vascular/Lymphatic: No significant vascular findings are present. No enlarged abdominal or pelvic lymph nodes. Reproductive: Prostate is unremarkable. Other: Fat herniates into the right inguinal canal without associated bowel. A small paraumbilical hernia contains fat. No other significant ventral hernias are present. No free fluid or free air is present. Musculoskeletal: Vertebral body heights.  T9 hemangioma noted. IMPRESSION: 1. Diffuse inflammatory changes compatible with acute pancreatitis. No complicating features are present. 2. Fat herniates into the right inguinal canal without associated bowel. 3. Small paraumbilical hernia contains fat. Electronically Signed   By: San Morelle M.D.   On: 07/07/2021 13:34   CT Head Wo Contrast  Result Date: 07/07/2021 CLINICAL DATA:  Dizziness.  Headache. EXAM: CT HEAD WITHOUT CONTRAST TECHNIQUE: Contiguous axial images were obtained from the base of the skull through the vertex without intravenous contrast. COMPARISON:  None. FINDINGS: Brain: No evidence of acute infarction, hemorrhage, hydrocephalus, extra-axial collection or mass lesion/mass effect. Vascular: No hyperdense vessel identified. Calcific intracranial atherosclerosis. Skull: No acute fracture. Sinuses/Orbits: Mild ethmoid air cell mucosal thickening without air-fluid levels. No acute orbital findings. Other: Small left  mastoid effusion. IMPRESSION: 1. No evidence of acute intracranial abnormality. 2. Small left mastoid effusion. Electronically Signed   By: Margaretha Sheffield MD   On: 07/07/2021 13:36   DG Chest Port 1 View  Result Date: 07/07/2021 CLINICAL DATA:  Weakness EXAM: PORTABLE CHEST 1 VIEW COMPARISON:  Radiograph 11/27/2004 FINDINGS: The heart size and mediastinal contours  are within normal limits. No focal airspace disease. No pleural effusion or pneumothorax. Possible prior right distal clavicle excision. IMPRESSION: No evidence of acute cardiopulmonary disease. Electronically Signed   By: Maurine Simmering   On: 07/07/2021 12:39       The results of significant diagnostics from this hospitalization (including imaging, microbiology, ancillary and laboratory) are listed below for reference.     Microbiology: Recent Results (from the past 240 hour(s))  Culture, blood (routine x 2)     Status: None (Preliminary result)   Collection Time: 07/07/21 11:38 AM   Specimen: BLOOD  Result Value Ref Range Status   Specimen Description   Final    BLOOD RIGHT ANTECUBITAL Performed at [facility] [address], [place], [place] [address]    Special Requests   Final    BOTTLES DRAWN AEROBIC AND ANAEROBIC Blood Culture adequate volume Performed at [facility] [address], [place], [place] [address]    Culture   Final    NO GROWTH 2 DAYS Performed at [facility] [address] [place], [place] [address]    Report Status PENDING  Incomplete  Culture, blood (routine x 2)     Status: None (Preliminary result)   Collection Time: 07/07/21  1:07 PM   Specimen: BLOOD  Result Value Ref Range Status   Specimen Description   Final    BLOOD SITE NOT SPECIFIED Performed at [facility] [address] [place], [place] [address]    Special Requests   Final    BOTTLES DRAWN AEROBIC AND ANAEROBIC Blood Culture adequate volume Performed at  [facility] [address], [place], [place] [address]    Culture   Final    NO GROWTH 2 DAYS Performed at [facility] [address], [place], [place] [address]    Report Status PENDING  Incomplete  Resp Panel by RT-PCR (Flu A&B, Covid) Nasopharyngeal Swab     Status: None   Collection Time: 07/07/21  1:12 PM   Specimen: Nasopharyngeal Swab; Nasopharyngeal(NP) swabs in vial transport medium  Result Value Ref Range Status   SARS Coronavirus 2 by RT PCR NEGATIVE NEGATIVE Final    Comment: (NOTE) SARS-CoV-2 target nucleic acids are NOT DETECTED.  The SARS-CoV-2 RNA is generally detectable in upper respiratory specimens during the acute phase of infection. The lowest concentration of SARS-CoV-2 viral copies this assay can detect is 138 copies/mL. A negative result does not preclude SARS-Cov-2 infection and should not be used as the sole basis for treatment or other patient management decisions. A negative result may occur with  improper specimen collection/handling, submission of specimen other than nasopharyngeal swab, presence of viral mutation(s) within the areas targeted by this assay, and inadequate number of viral copies(<138 copies/mL). A negative result must be combined with clinical observations, patient history, and epidemiological information. The expected result is Negative.  Fact Sheet for Patients:  EntrepreneurPulse.com.au  Fact Sheet for Healthcare Providers:  IncredibleEmployment.be  This test is no t yet approved or cleared by the [place] FDA and  has been authorized for detection and/or diagnosis of SARS-CoV-2 by FDA under an Emergency Use Authorization (EUA). This EUA will remain  in effect (meaning this test can be used) for the duration of the COVID-19 declaration under Section 564(b)(1) of the Act, 21 U.S.C.section 360bbb-3(b)(1), unless the authorization is terminated  or revoked sooner.        Influenza A by PCR  NEGATIVE NEGATIVE Final   Influenza B by PCR NEGATIVE NEGATIVE Final    Comment: (NOTE) The Xpert Xpress SARS-CoV-2/FLU/RSV plus assay is intended as an aid in the diagnosis of influenza from Nasopharyngeal swab specimens and should not be used as a sole basis for treatment. Nasal washings and aspirates are unacceptable for Xpert Xpress SARS-CoV-2/FLU/RSV testing.  Fact Sheet for Patients: EntrepreneurPulse.com.au  Fact Sheet for Healthcare Providers: IncredibleEmployment.be  This test is not yet approved or cleared by the [place] FDA and has been authorized for detection and/or diagnosis of SARS-CoV-2 by FDA under an Emergency Use Authorization (EUA). This EUA will remain in effect (meaning this test can be used) for the duration of the COVID-19 declaration under Section 564(b)(1) of the Act, 21 U.S.C. section 360bbb-3(b)(1), unless the authorization is terminated or revoked.  Performed at [facility], [place] [address], [place], [place] [address]      Labs:  CBC: Recent Labs  Lab 07/07/21 1042 07/09/21 0357  WBC 11.5* 4.6  NEUTROABS 9.0*  --   HGB 14.5 11.0*  HCT 41.8 32.6*  MCV 109.1* 112.0*  PLT 182 97*   BMP &GFR Recent Labs  Lab 07/07/21 1042 07/08/21 0411 07/08/21 0728 07/09/21 0357  NA 137 137  --  139  K 3.6 3.4*  --  4.2  CL 102 108  --  109  CO2 23 23  --  23  GLUCOSE 105* 93  --  90  BUN 22* 21*  --  14  CREATININE 6.07* 4.91*  --  1.58*  CALCIUM 10.9* 9.1  --  9.6  MG  --   --  1.4* 1.6*  PHOS  --   --   --  2.4*   Estimated Creatinine Clearance: 58.4 mL/min (A) (by C-G formula based on SCr of 1.58 mg/dL (H)). Liver & Pancreas: Recent Labs  Lab 07/07/21 1042 07/09/21 0357  AST 31 25  ALT 23 19  ALKPHOS 55 38  BILITOT 0.4 0.5  PROT 7.3 5.1*  ALBUMIN 4.5 3.2*   Recent Labs  Lab 07/07/21 1356 07/08/21 0728 07/09/21 0357  LIPASE 48 31 29   No  results for input(s): AMMONIA in the last 168 hours. Diabetic: No results for input(s): HGBA1C in the last 72 hours. No results for input(s): GLUCAP in the last 168 hours. Cardiac Enzymes: No results for input(s): CKTOTAL, CKMB, CKMBINDEX, TROPONINI in the last 168 hours. No results for input(s): PROBNP in the last 8760 hours. Coagulation Profile: No results for input(s): INR, PROTIME in the last 168 hours. Thyroid Function Tests: Recent Labs    07/09/21 0357  TSH 58.620*   Lipid Profile: Recent Labs    07/07/21 1356 07/08/21 0728  TRIG 336* 260*   Anemia Panel: Recent Labs    07/07/21 2034 07/08/21 0728  VITAMINB12 205  --   FOLATE  --  9.6   Urine analysis: No results found for: COLORURINE, APPEARANCEUR, LABSPEC, PHURINE, GLUCOSEU, HGBUR, BILIRUBINUR, KETONESUR, PROTEINUR, UROBILINOGEN, NITRITE, LEUKOCYTESUR Sepsis Labs: Invalid input(s): PROCALCITONIN, LACTICIDVEN   Time coordinating discharge: 45 minutes  SIGNED:  Mercy Riding, MD  [facility] 07/09/2021, 5:30 PM  If 7PM-7AM, please contact night-coverage [email or web address]

## 2021-07-12 LAB — CULTURE, BLOOD (ROUTINE X 2)
Culture: NO GROWTH
Culture: NO GROWTH
Special Requests: ADEQUATE
Special Requests: ADEQUATE

## 2021-07-12 LAB — ROCKY MTN SPOTTED FVR ABS PNL(IGG+IGM)
RMSF IgG: NEGATIVE
RMSF IgM: 1 index — ABNORMAL HIGH (ref 0.00–0.89)

## 2021-07-17 ENCOUNTER — Inpatient Hospital Stay: Payer: 59

## 2021-07-17 ENCOUNTER — Other Ambulatory Visit: Payer: Self-pay

## 2021-07-17 ENCOUNTER — Inpatient Hospital Stay: Payer: 59 | Attending: Hematology and Oncology | Admitting: Hematology and Oncology

## 2021-07-17 ENCOUNTER — Encounter: Payer: Self-pay | Admitting: Hematology and Oncology

## 2021-07-17 DIAGNOSIS — D721 Eosinophilia, unspecified: Secondary | ICD-10-CM | POA: Diagnosis present

## 2021-07-17 DIAGNOSIS — Z79899 Other long term (current) drug therapy: Secondary | ICD-10-CM | POA: Insufficient documentation

## 2021-07-17 DIAGNOSIS — E538 Deficiency of other specified B group vitamins: Secondary | ICD-10-CM | POA: Diagnosis not present

## 2021-07-17 LAB — COMPREHENSIVE METABOLIC PANEL
ALT: 17 U/L (ref 0–44)
AST: 20 U/L (ref 15–41)
Albumin: 3.7 g/dL (ref 3.5–5.0)
Alkaline Phosphatase: 51 U/L (ref 38–126)
Anion gap: 6 (ref 5–15)
BUN: 11 mg/dL (ref 8–23)
CO2: 31 mmol/L (ref 22–32)
Calcium: 9.5 mg/dL (ref 8.9–10.3)
Chloride: 104 mmol/L (ref 98–111)
Creatinine, Ser: 1 mg/dL (ref 0.61–1.24)
GFR, Estimated: >60 mL/min (ref >60)
Glucose, Bld: 84 mg/dL (ref 70–99)
Potassium: 4.2 mmol/L (ref 3.5–5.1)
Sodium: 141 mmol/L (ref 135–145)
Total Bilirubin: 0.6 mg/dL (ref 0.3–1.2)
Total Protein: 6.3 g/dL — ABNORMAL LOW (ref 6.5–8.1)

## 2021-07-17 LAB — CBC WITH DIFFERENTIAL/PLATELET
Abs Immature Granulocytes: 0.03 10*3/uL (ref 0.00–0.07)
Basophils Absolute: 0 10*3/uL (ref 0.0–0.1)
Basophils Relative: 1 %
Eosinophils Absolute: 0.4 10*3/uL (ref 0.0–0.5)
Eosinophils Relative: 5 %
HCT: 32.7 % — ABNORMAL LOW (ref 39.0–52.0)
Hemoglobin: 11.3 g/dL — ABNORMAL LOW (ref 13.0–17.0)
Immature Granulocytes: 0 %
Lymphocytes Relative: 20 %
Lymphs Abs: 1.5 10*3/uL (ref 0.7–4.0)
MCH: 37.8 pg — ABNORMAL HIGH (ref 26.0–34.0)
MCHC: 34.6 g/dL (ref 30.0–36.0)
MCV: 109.4 fL — ABNORMAL HIGH (ref 80.0–100.0)
Monocytes Absolute: 0.7 10*3/uL (ref 0.1–1.0)
Monocytes Relative: 10 %
Neutro Abs: 4.8 10*3/uL (ref 1.7–7.7)
Neutrophils Relative %: 64 %
Platelets: 196 10*3/uL (ref 150–400)
RBC: 2.99 MIL/uL — ABNORMAL LOW (ref 4.22–5.81)
RDW: 14.5 % (ref 11.5–15.5)
WBC: 7.4 10*3/uL (ref 4.0–10.5)
nRBC: 0 % (ref 0.0–0.2)

## 2021-07-17 LAB — IRON AND TIBC
Iron: 114 ug/dL (ref 42–163)
Saturation Ratios: 33 % (ref 20–55)
TIBC: 343 ug/dL (ref 202–409)
UIBC: 229 ug/dL (ref 117–376)

## 2021-07-17 LAB — FERRITIN: Ferritin: 58 ng/mL (ref 24–336)

## 2021-07-17 NOTE — Progress Notes (Signed)
[facility] NOTE  Patient Care Team: Kristie Cowman, MD as PCP - General (Family Medicine)  CHIEF COMPLAINTS/PURPOSE OF CONSULTATION:  Follow-up about iron overload disorder  ASSESSMENT & PLAN:   This is a pleasant 60 year old male patient with past medical history significant for hypertension, musculoskeletal issues, thyroid disease referred to hematology for evaluation of possible iron overload disorder.  #1.  Iron overload disorder his last labs in June showed a ferritin of 177.   Since his last visit, he had an episode of severe dehydration, hypotension, AKI secondary to excessive alcohol intake and poor p.o. intake.  This has resolved or continues to improve. His last ferritin is uptrending, hence we will schedule therapeutic phlebotomy, he was unable to donate.  He will start phlebotomy next week and if lab parameters support, he will continue monthly phlebotomy to maintain a target ferritin less than or equal to 50. He understands that the labs are supposed to be done the day before the phlebotomy so we have all the results to verify the parameters.  2. Skin rash, eosinophilia, leukocytosis, peripheral neuropathy and Gastrointestinal issues such as nausea and vomiting. HES work up negative. He continues to have the skin rash on the back, currently continuing allergy shots.  3.  B12 deficiency, we have started him on B12 injections.  In the past he had difficulty maintaining normal B12 levels with oral B12 supplementation hence we will continue intramuscular B12 injections for now once a week and will reevaluate his B12 levels when he returns to clinic for follow-up in 3 months.  HISTORY OF PRESENTING ILLNESS:   Philip Humphrey 60 y.o. 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  This is a very pleasant 60 year old male patient with past medical history significant for hypertension, musculoskeletal issues, peripheral neuropathy who was referred to hematology  for evaluation of possible iron overload disorder.   Interval History  He is here for FU by himself.   He has a lot going on in his personal life, has some stressors ongoing with his family situation.  He apparently had an episode of excessive drinking and poor oral intake given family stressors, was admitted to the hospital with AKI, borderline pancreatitis and had hydration and his creatinine has improved.  He has not been able to donate blood, he was told by the blood bank that they cannot really take his blood given his hemochromatosis. Besides some residual leg swelling, he has his chronic hip pain, skin rash.  No other new complaints. Rest of the pertinent 10 point ROS reviewed and negative.  MEDICAL HISTORY:  Past Medical History:  Diagnosis Date   Allergy    occ. seasonal   Anxiety    Arthritis    Colon polyp    Condyloma    anal   Depression    GERD (gastroesophageal reflux disease)    History of transfusion    as child   Hypertension    Knee injury    right knee cap w piece broken off- MRI done 02-06-15-pending surgery. 11-18-16 right knee is still painful, both shoulders(limited ROM right).   Knee pain    Neuropathy    bilateral- greater left   Palpitations    normal stress test 10 yrs ago   Pneumonia    Thyroid disease    Thyroid removed unintentionally at age 66- no further thyroid tissue remains-uses daily supplement    SURGICAL HISTORY: Past Surgical History:  Procedure Laterality Date   COLONOSCOPY  COLONOSCOPY N/A 02/15/2015   Procedure: COLONOSCOPY;  Surgeon: Alphonsa Overall, MD;  Location: [facility];  Service: General;  Laterality: N/A;   cyst removed     from neck / abdomed   EXAMINATION UNDER ANESTHESIA N/A 02/15/2015   Procedure: EXAM UNDER ANESTHESIA;  Surgeon: Pedro Earls, MD;  Location: [facility];  Service: General;  Laterality: N/A;   HERNIA REPAIR Left    10 yrs ago   KNEE ARTHROSCOPY Left    x1 and meniscal tear    ROTATOR CUFF REPAIR Right     x1   SHOULDER ARTHROSCOPY W/ ROTATOR CUFF REPAIR Left    THYROIDECTOMY  age 55    TOTAL HIP ARTHROPLASTY Left 11/24/2016   Procedure: LEFT TOTAL HIP ARTHROPLASTY ANTERIOR APPROACH;  Surgeon: Paralee Cancel, MD;  Location: [facility];  Service: Orthopedics;  Laterality: Left;   TOTAL HIP ARTHROPLASTY     Right hip  Dr. Alvan Dame 08/03/17   TOTAL HIP ARTHROPLASTY Right 08/03/2017   Procedure: RIGHT TOTAL HIP ARTHROPLASTY ANTERIOR APPROACH;  Surgeon: Paralee Cancel, MD;  Location: [facility];  Service: Orthopedics;  Laterality: Right;  70 mins   UMBILICAL HERNIA REPAIR N/A 10/18/2017   Procedure: Umbilical hernia repair  and excision of lipomas;  Surgeon: Johnathan Hausen, MD;  Location: [facility];  Service: General;  Laterality: N/A;   WART FULGURATION N/A 02/15/2015   Procedure: REMOVAL OF ANAL TAGS,CONDYLOMA;  Surgeon: Pedro Earls, MD;  Location: [facility];  Service: General;  Laterality: N/A;   WRIST SURGERY     x2- left (repair tendon/ ligament)    SOCIAL HISTORY: Social History   Socioeconomic History   Marital status: Married    Spouse name: Not on file   Number of children: Not on file   Years of education: Not on file   Highest education level: Not on file  Occupational History   Not on file  Tobacco Use   Smoking status: Former    Types: Cigars   Smokeless tobacco: Never   Tobacco comments:    occasional quit 2012  Vaping Use   Vaping Use: Never used  Substance and Sexual Activity   Alcohol use: Yes    Comment: drinking liquor each night recently   Drug use: No   Sexual activity: Yes  Other Topics Concern   Not on file  Social History Narrative   Not on file   Social Determinants of Health   Financial Resource Strain: Not on file  Food Insecurity: Not on file  Transportation Needs: Not on file  Physical Activity: Not on file  Stress: Not on file  Social Connections: Not on file  Intimate Partner Violence: Not on file    FAMILY HISTORY: Family History  Problem Relation Age  of Onset   Heart disease Mother            Lung disease Brother    Stroke Father     ALLERGIES:  is allergic to sulfa antibiotics and oxycodone.  MEDICATIONS:  Current Outpatient Medications  Medication Sig Dispense Refill   acamprosate (CAMPRAL) 333 MG tablet Take 2 tablets (666 mg total) by mouth 3 (three) times daily with meals. 90 tablet 0   acetaminophen (TYLENOL) 500 MG tablet Take 1 tablet (500 mg total) by mouth every 8 (eight) hours as needed for mild pain, fever or headache. 60 tablet    albuterol (VENTOLIN HFA) 108 (90 Base) MCG/ACT inhaler Inhale 2 puffs into the lungs every 6 (six) hours as needed for wheezing  or shortness of breath.     amoxicillin (AMOXIL) 500 MG capsule Take 2,000 mg by mouth See admin instructions. Take 4 capsules (2000 mg) by mouth one hour prior to dental appointments     aspirin EC 81 MG tablet Take 1 tablet (81 mg total) by mouth daily. Swallow whole. 365 tablet 0   atorvastatin (LIPITOR) 80 MG tablet Take 80 mg by mouth daily.     bismuth subsalicylate (PEPTO BISMOL) 262 MG/15ML suspension Take by mouth every 6 (six) hours as needed for indigestion (stomach pain).     calcium carbonate (TUMS - DOSED IN MG ELEMENTAL CALCIUM) 500 MG chewable tablet Chew 2 tablets by mouth at bedtime as needed for indigestion or heartburn.     cyanocobalamin (,VITAMIN B-12,) 1000 MCG/ML injection INJECT 1 ML (1,000 MCG TOTAL) INTO THE MUSCLE ONCE A WEEK FOR 4 DOSES. (Patient not taking: No sig reported) 4 mL 1   desloratadine (CLARINEX) 5 MG tablet Take 10 mg by mouth daily.     diclofenac Sodium (VOLTAREN) 1 % GEL Apply 1 application topically 2 (two) times daily as needed (pain).     docusate sodium (COLACE) 100 MG capsule Take 1 capsule (100 mg total) by mouth 2 (two) times daily. (Patient not taking: No sig reported) 10 capsule 0   doxepin (SINEQUAN) 10 MG capsule Take 10 mg by mouth at bedtime.     famotidine (PEPCID) 40 MG tablet Take 40 mg by mouth daily.      folic acid (FOLVITE) 1 MG tablet TAKE 1 TABLET BY MOUTH EVERY DAY (Patient taking differently: Take 1 mg by mouth daily.) 90 tablet 1   gabapentin (NEURONTIN) 300 MG capsule Take 300-600 mg by mouth See admin instructions. Take 1 capsule (300 mg) in the morning & 2 capsules (600 mg) at night.     hydrOXYzine (ATARAX/VISTARIL) 25 MG tablet Take 25 mg by mouth 2 (two) times daily. For itching     lisinopril (ZESTRIL) 20 MG tablet Take 1 tablet (20 mg total) by mouth daily.     Magnesium 400 MG TABS Take 400 mg by mouth daily.     meclizine (ANTIVERT) 12.5 MG tablet Take 12.5 mg by mouth 2 (two) times daily as needed (vertigo).     montelukast (SINGULAIR) 10 MG tablet Take 10 mg by mouth daily.     nitroGLYCERIN (NITROSTAT) 0.4 MG SL tablet Place 0.4 mg under the tongue every 5 (five) minutes as needed for chest pain.     Olopatadine HCl (PATADAY OP) Place 1 drop into both eyes 2 (two) times daily as needed (itching/allergies).     sertraline (ZOLOFT) 25 MG tablet Take 25 mg by mouth at bedtime.     SYNTHROID 175 MCG tablet Take 1 tablet (175 mcg total) by mouth daily. NO MORE REFILLS WITHOUT OFFICE VISIT - 2ND NOTICE 15 tablet 0   vitamin B-12 (CYANOCOBALAMIN) 1000 MCG tablet Take 1,000 mcg by mouth daily.     Vitamin D, Ergocalciferol, (DRISDOL) 1.25 MG (50000 UNIT) CAPS capsule Take 50,000 Units by mouth once a week.     No current facility-administered medications for this visit.    PHYSICAL EXAMINATION: ECOG PERFORMANCE STATUS: 0 - Asymptomatic  Vitals:   07/17/21 0948  BP: (!) 144/95  Pulse: 93  Resp: 18  Temp: 98.5 F (36.9 C)  SpO2: 99%   Filed Weights   07/17/21 0948  Weight: 236 lb 6.4 oz (107.2 kg)    Physical Exam Constitutional:  Appearance: Normal appearance.  HENT:     Head: Normocephalic and atraumatic.  Cardiovascular:     Rate and Rhythm: Regular rhythm.     Pulses: Normal pulses.     Heart sounds: Normal heart sounds.  Pulmonary:     Effort: Pulmonary  effort is normal.     Breath sounds: Normal breath sounds.  Abdominal:     General: Abdomen is flat. Bowel sounds are normal.     Palpations: Abdomen is soft.  Musculoskeletal:        General: No swelling or tenderness. Normal range of motion.     Cervical back: Normal range of motion and neck supple. No rigidity or tenderness.  Lymphadenopathy:     Cervical: No cervical adenopathy.  Skin:    Findings: Rash (Maculopapular rash on the upper trunk noted on his back, no significant change) present.  Neurological:     General: No focal deficit present.     Mental Status: He is alert.  Psychiatric:        Mood and Affect: Mood normal.        Thought Content: Thought content normal.    LABORATORY DATA:  I have reviewed the data as listed Lab Results  Component Value Date   WBC 4.6 07/09/2021   HGB 11.0 (L) 07/09/2021   HCT 32.6 (L) 07/09/2021   MCV 112.0 (H) 07/09/2021   PLT 97 (L) 07/09/2021     Chemistry      Component Value Date/Time   NA 139 07/09/2021 0357   K 4.2 07/09/2021 0357   CL 109 07/09/2021 0357   CO2 23 07/09/2021 0357   BUN 14 07/09/2021 0357   CREATININE 1.58 (H) 07/09/2021 0357   CREATININE 0.97 04/23/2021 1057   CREATININE 1.22 07/13/2013 1253      Component Value Date/Time   CALCIUM 9.6 07/09/2021 0357   ALKPHOS 38 07/09/2021 0357   AST 25 07/09/2021 0357   AST 39 04/23/2021 1057   ALT 19 07/09/2021 0357   ALT 29 04/23/2021 1057   BILITOT 0.5 07/09/2021 0357   BILITOT 0.4 04/23/2021 1057     Labs from today are pending.  RADIOGRAPHIC STUDIES: I have personally reviewed the radiological images as listed and agreed with the findings in the report. CT Abdomen Pelvis Wo Contrast  Result Date: 07/07/2021 CLINICAL DATA:  Abdominal pain, acute, nonlocalized. Severe headaches. EXAM: CT ABDOMEN AND PELVIS WITHOUT CONTRAST TECHNIQUE: Multidetector CT imaging of the abdomen and pelvis was performed following the standard protocol without IV contrast.  COMPARISON:  None. FINDINGS: Lower chest: The lung bases are clear without focal nodule, mass, or airspace disease. Heart size is normal. Hepatobiliary: 8 mm hypodense lesion the right lobe of the liver likely represents a cyst on image 30 of series 2. No other focal hepatic lesions are present. The common bile duct and gallbladder are normal. Pancreas: Diffuse inflammatory changes are present pancreas. Focal mass or cyst is present. No focal fluid collection. Spleen: Normal in size without focal abnormality. Adrenals/Urinary Tract: Adrenal glands are normal bilaterally. Fluid density lesion posteriorly in the left kidney measures 14 mm. No other focal hepatic lesions are present. No stone or mass lesion is present. Obstruction is present. Ureters are within normal limits. The urinary bladder is normal. Stomach/Bowel: Stomach and duodenum are within normal limits. Small bowel is unremarkable. Terminal ileum is normal. The appendix is visualized and within normal limits. Ascending and transverse colon are normal. Descending and sigmoid colon are within normal limits. Vascular/Lymphatic:  No significant vascular findings are present. No enlarged abdominal or pelvic lymph nodes. Reproductive: Prostate is unremarkable. Other: Fat herniates into the right inguinal canal without associated bowel. A small paraumbilical hernia contains fat. No other significant ventral hernias are present. No free fluid or free air is present. Musculoskeletal: Vertebral body heights.  T9 hemangioma noted. IMPRESSION: 1. Diffuse inflammatory changes compatible with acute pancreatitis. No complicating features are present. 2. Fat herniates into the right inguinal canal without associated bowel. 3. Small paraumbilical hernia contains fat. Electronically Signed   By: San Morelle M.D.   On: 07/07/2021 13:34   CT Head Wo Contrast  Result Date: 07/07/2021 CLINICAL DATA:  Dizziness.  Headache. EXAM: CT HEAD WITHOUT CONTRAST TECHNIQUE:  Contiguous axial images were obtained from the base of the skull through the vertex without intravenous contrast. COMPARISON:  None. FINDINGS: Brain: No evidence of acute infarction, hemorrhage, hydrocephalus, extra-axial collection or mass lesion/mass effect. Vascular: No hyperdense vessel identified. Calcific intracranial atherosclerosis. Skull: No acute fracture. Sinuses/Orbits: Mild ethmoid air cell mucosal thickening without air-fluid levels. No acute orbital findings. Other: Small left mastoid effusion. IMPRESSION: 1. No evidence of acute intracranial abnormality. 2. Small left mastoid effusion. Electronically Signed   By: Margaretha Sheffield MD   On: 07/07/2021 13:36   DG Chest Port 1 View  Result Date: 07/07/2021 CLINICAL DATA:  Weakness EXAM: PORTABLE CHEST 1 VIEW COMPARISON:  Radiograph 11/27/2004 FINDINGS: The heart size and mediastinal contours are within normal limits. No focal airspace disease. No pleural effusion or pneumothorax. Possible prior right distal clavicle excision. IMPRESSION: No evidence of acute cardiopulmonary disease. Electronically Signed   By: Maurine Simmering   On: 07/07/2021 12:39    All questions were answered. The patient knows to call the clinic with any problems, questions or concerns. I spent 30 minutes in the care of this patient including H and P, review of records, counseling and coordination of care. We discussed about the need for therapeutic phlebotomy, need to stop drinking alcohol given his underlying iron overload, discussed about his other social stressors and follow-up plan. Thank you for consulting [place] in the care of this patient.  Please do not hesitate to contact us with any questions or concerns.    Benay Pike, MD 07/17/2021 10:07 AM

## 2021-07-25 ENCOUNTER — Other Ambulatory Visit: Payer: Self-pay | Admitting: Hematology and Oncology

## 2021-07-25 ENCOUNTER — Other Ambulatory Visit: Payer: Self-pay

## 2021-07-25 ENCOUNTER — Inpatient Hospital Stay: Payer: 59 | Attending: Hematology and Oncology

## 2021-07-25 NOTE — Progress Notes (Signed)
Pt presented for a phlebotomy procedure today. Per MD notes use labs from 07/17/21. Order parameters state that ok to proceed with phleb if ferratin is greater than 50 and hct is greater than 36. Pt ferratin was 58 but hct was 32.7. per Dr.Gorsuch no phlebotomy today. Pt made aware and agreeable, stated he will call with any questions or concerns.

## 2021-08-15 ENCOUNTER — Encounter: Payer: Self-pay | Admitting: General Practice

## 2021-08-15 NOTE — Progress Notes (Signed)
[facility] Spiritual Care Note  Referred by Junie Panning Huenink/RN for spiritual and emotional support related to the death of Mr Sturdy's wife three years ago. Reached him around the anniversary of that time for a pastoral check-in. He was very appreciative of call and reports that "the house is alive again" now that his daughter (recently separated) and almost one-year-old grandson are staying with him. They bring meaning, joy, and mutual support to his days, and looking forward to his grandson's big outdoor birthday party tomorrow and a future trip to the beach (hoping early fall) give his hope some structure. We plan to follow up by phone in ca one month.   [place], [place], [facility] Pager [phone] Voicemail [phone]

## 2021-08-20 ENCOUNTER — Inpatient Hospital Stay: Payer: 59

## 2021-08-20 ENCOUNTER — Ambulatory Visit: Payer: 59

## 2021-08-20 ENCOUNTER — Telehealth: Payer: Self-pay | Admitting: Hematology and Oncology

## 2021-08-20 NOTE — Telephone Encounter (Signed)
R/S per pt covid exposure, per 8/30 secure chat per melanie & dr.iruku , pt can come in sooner than originally scheduled. Offered pt updated appt , pt refused,said he would call back later.

## 2021-08-22 ENCOUNTER — Inpatient Hospital Stay: Payer: 59

## 2021-08-24 ENCOUNTER — Other Ambulatory Visit: Payer: Self-pay | Admitting: Hematology and Oncology

## 2021-09-10 ENCOUNTER — Inpatient Hospital Stay: Payer: 59 | Attending: Hematology and Oncology

## 2021-09-12 ENCOUNTER — Inpatient Hospital Stay: Payer: 59

## 2021-09-17 ENCOUNTER — Encounter: Payer: Self-pay | Admitting: General Practice

## 2021-09-17 NOTE — Progress Notes (Signed)
[facility] Spiritual Care Note  Followed up with Philip Humphrey by phone as planned, reaching him at an inconvenient time. Will try back within the week.   [place], [place], [facility] Pager [phone] Voicemail [phone]

## 2021-09-23 ENCOUNTER — Other Ambulatory Visit: Payer: Self-pay | Admitting: Hematology and Oncology

## 2021-09-23 ENCOUNTER — Encounter: Payer: Self-pay | Admitting: General Practice

## 2021-09-23 NOTE — Progress Notes (Signed)
[facility] Spiritual Care Note  Left voicemail of support and care, encouraging return call.   [place], [place], [facility] Pager [phone] Voicemail [phone]

## 2021-09-25 ENCOUNTER — Inpatient Hospital Stay: Payer: 59 | Attending: Hematology and Oncology

## 2021-09-26 ENCOUNTER — Inpatient Hospital Stay: Payer: 59 | Admitting: Physician Assistant

## 2021-09-26 ENCOUNTER — Inpatient Hospital Stay: Payer: 59

## 2021-10-08 ENCOUNTER — Inpatient Hospital Stay: Payer: 59 | Admitting: Physician Assistant

## 2021-10-08 ENCOUNTER — Inpatient Hospital Stay: Payer: 59

## 2021-10-08 ENCOUNTER — Telehealth: Payer: Self-pay

## 2021-10-08 NOTE — Telephone Encounter (Signed)
Attempted to call pt regarding appt today. Pt is [place]. LVM for pt to return call.

## 2021-12-03 NOTE — Progress Notes (Deleted)
[place] NOTE  Patient Care Team: Kristie Cowman, MD as PCP - General (Family Medicine)  CHIEF COMPLAINTS/PURPOSE OF CONSULTATION:  Follow-up about iron overload disorder  ASSESSMENT & PLAN:   This is a pleasant 60 year old male patient with past medical history significant for hypertension, musculoskeletal issues, thyroid disease referred to hematology for evaluation of possible iron overload disorder.  #1.  Iron overload disorder his last labs in in July showed a ferritin of 58.   This is right around the required parameter of 50-100.  Interim review of systems and physical examination pertinent for We will repeat labs today and if ferritin is in the therapeutic range, he can return to clinic for follow-up in about 4 to 6 months.  2. Skin rash, eosinophilia, leukocytosis, peripheral neuropathy and Gastrointestinal issues such as nausea and vomiting. HES work up negative. He continues to have the skin rash on the back, currently continuing allergy shots.  3.  B12 deficiency, we have started him on B12 injections.  In the past he had difficulty maintaining normal B12 levels with oral B12 supplementation hence we will continue intramuscular B12 injections for now once a week and will reevaluate his B12 levels when he returns to clinic for follow-up in 3 months.  HISTORY OF PRESENTING ILLNESS:   Philip Humphrey 60 y.o. 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  This is a very pleasant 60 year old male patient with past medical history significant for hypertension, musculoskeletal issues, peripheral neuropathy who was referred to hematology for evaluation of possible iron overload disorder.   Interval History  He is here for FU by himself.   . Rest of the pertinent 10 point ROS reviewed and negative.  MEDICAL HISTORY:  Past Medical History:  Diagnosis Date   Allergy    occ. seasonal   Anxiety    Arthritis    Colon polyp    Condyloma    anal    Depression    GERD (gastroesophageal reflux disease)    History of transfusion    as child   Hypertension    Knee injury    right knee cap w piece broken off- MRI done 02-06-15-pending surgery. 11-18-16 right knee is still painful, both shoulders(limited ROM right).   Knee pain    Neuropathy    bilateral- greater left   Palpitations    normal stress test 10 yrs ago   Pneumonia    Thyroid disease    Thyroid removed unintentionally at age 3- no further thyroid tissue remains-uses daily supplement    SURGICAL HISTORY: Past Surgical History:  Procedure Laterality Date   COLONOSCOPY     COLONOSCOPY N/A 02/15/2015   Procedure: COLONOSCOPY;  Surgeon: Alphonsa Overall, MD;  Location: [facility];  Service: General;  Laterality: N/A;   cyst removed     from neck / abdomed   EXAMINATION UNDER ANESTHESIA N/A 02/15/2015   Procedure: EXAM UNDER ANESTHESIA;  Surgeon: Pedro Earls, MD;  Location: [facility];  Service: General;  Laterality: N/A;   HERNIA REPAIR Left    10 yrs ago   KNEE ARTHROSCOPY Left    x1 and meniscal tear    ROTATOR CUFF REPAIR Right    x1   SHOULDER ARTHROSCOPY W/ ROTATOR CUFF REPAIR Left    THYROIDECTOMY  age 11    TOTAL HIP ARTHROPLASTY Left 11/24/2016   Procedure: LEFT TOTAL HIP ARTHROPLASTY ANTERIOR APPROACH;  Surgeon: Paralee Cancel, MD;  Location: [facility];  Service: Orthopedics;  Laterality: Left;  TOTAL HIP ARTHROPLASTY     Right hip  Dr. Alvan Dame 08/03/17   TOTAL HIP ARTHROPLASTY Right 08/03/2017   Procedure: RIGHT TOTAL HIP ARTHROPLASTY ANTERIOR APPROACH;  Surgeon: Paralee Cancel, MD;  Location: [facility];  Service: Orthopedics;  Laterality: Right;  70 mins   UMBILICAL HERNIA REPAIR N/A 10/18/2017   Procedure: Umbilical hernia repair  and excision of lipomas;  Surgeon: Johnathan Hausen, MD;  Location: [facility];  Service: General;  Laterality: N/A;   WART FULGURATION N/A 02/15/2015   Procedure: REMOVAL OF ANAL TAGS,CONDYLOMA;  Surgeon: Pedro Earls, MD;  Location: [facility];  Service:  General;  Laterality: N/A;   WRIST SURGERY     x2- left (repair tendon/ ligament)    SOCIAL HISTORY: Social History   Socioeconomic History   Marital status: Married    Spouse name: Not on file   Number of children: Not on file   Years of education: Not on file   Highest education level: Not on file  Occupational History   Not on file  Tobacco Use   Smoking status: Former    Types: Cigars   Smokeless tobacco: Never   Tobacco comments:    occasional quit 2012  Vaping Use   Vaping Use: Never used  Substance and Sexual Activity   Alcohol use: Yes    Comment: drinking liquor each night recently   Drug use: No   Sexual activity: Yes  Other Topics Concern   Not on file  Social History Narrative   Not on file   Social Determinants of Health   Financial Resource Strain: Not on file  Food Insecurity: Not on file  Transportation Needs: Not on file  Physical Activity: Not on file  Stress: Not on file  Social Connections: Not on file  Intimate Partner Violence: Not on file    FAMILY HISTORY: Family History  Problem Relation Age of Onset   Heart disease Mother            Lung disease Brother    Stroke Father     ALLERGIES:  is allergic to sulfa antibiotics and oxycodone.  MEDICATIONS:  Current Outpatient Medications  Medication Sig Dispense Refill   acamprosate (CAMPRAL) 333 MG tablet Take 2 tablets (666 mg total) by mouth 3 (three) times daily with meals. 90 tablet 0   albuterol (VENTOLIN HFA) 108 (90 Base) MCG/ACT inhaler Inhale 2 puffs into the lungs every 6 (six) hours as needed for wheezing or shortness of breath.     amoxicillin (AMOXIL) 500 MG capsule Take 2,000 mg by mouth See admin instructions. Take 4 capsules (2000 mg) by mouth one hour prior to dental appointments     aspirin EC 81 MG tablet Take 1 tablet (81 mg total) by mouth daily. Swallow whole. 365 tablet 0   atorvastatin (LIPITOR) 80 MG tablet Take 80 mg by mouth daily.     bismuth subsalicylate  (PEPTO BISMOL) 262 MG/15ML suspension Take by mouth every 6 (six) hours as needed for indigestion (stomach pain).     calcium carbonate (TUMS - DOSED IN MG ELEMENTAL CALCIUM) 500 MG chewable tablet Chew 2 tablets by mouth at bedtime as needed for indigestion or heartburn.     desloratadine (CLARINEX) 5 MG tablet Take 10 mg by mouth daily.     diclofenac Sodium (VOLTAREN) 1 % GEL Apply 1 application topically 2 (two) times daily as needed (pain).     docusate sodium (COLACE) 100 MG capsule Take 1 capsule (100 mg total) by  mouth 2 (two) times daily. (Patient not taking: No sig reported) 10 capsule 0   doxepin (SINEQUAN) 10 MG capsule Take 10 mg by mouth at bedtime.     famotidine (PEPCID) 40 MG tablet Take 40 mg by mouth daily.     folic acid (FOLVITE) 1 MG tablet TAKE 1 TABLET BY MOUTH EVERY DAY (Patient taking differently: Take 1 mg by mouth daily.) 90 tablet 1   gabapentin (NEURONTIN) 300 MG capsule Take 300-600 mg by mouth See admin instructions. Take 1 capsule (300 mg) in the morning & 2 capsules (600 mg) at night.     hydrOXYzine (ATARAX/VISTARIL) 25 MG tablet Take 25 mg by mouth 2 (two) times daily. For itching     lisinopril (ZESTRIL) 20 MG tablet Take 1 tablet (20 mg total) by mouth daily.     Magnesium 400 MG TABS Take 400 mg by mouth daily.     meclizine (ANTIVERT) 12.5 MG tablet Take 12.5 mg by mouth 2 (two) times daily as needed (vertigo).     montelukast (SINGULAIR) 10 MG tablet Take 10 mg by mouth daily.     nitroGLYCERIN (NITROSTAT) 0.4 MG SL tablet Place 0.4 mg under the tongue every 5 (five) minutes as needed for chest pain.     Olopatadine HCl (PATADAY OP) Place 1 drop into both eyes 2 (two) times daily as needed (itching/allergies).     sertraline (ZOLOFT) 25 MG tablet Take 25 mg by mouth at bedtime.     SYNTHROID 175 MCG tablet Take 1 tablet (175 mcg total) by mouth daily. NO MORE REFILLS WITHOUT OFFICE VISIT - 2ND NOTICE 15 tablet 0   Vitamin D, Ergocalciferol, (DRISDOL) 1.25 MG  (50000 UNIT) CAPS capsule Take 50,000 Units by mouth once a week.     No current facility-administered medications for this visit.    PHYSICAL EXAMINATION: ECOG PERFORMANCE STATUS: 0 - Asymptomatic  There were no vitals filed for this visit.  There were no vitals filed for this visit.   Physical Exam Constitutional:      Appearance: Normal appearance.  HENT:     Head: Normocephalic and atraumatic.  Cardiovascular:     Rate and Rhythm: Regular rhythm.     Pulses: Normal pulses.     Heart sounds: Normal heart sounds.  Pulmonary:     Effort: Pulmonary effort is normal.     Breath sounds: Normal breath sounds.  Abdominal:     General: Abdomen is flat. Bowel sounds are normal.     Palpations: Abdomen is soft.  Musculoskeletal:        General: No swelling or tenderness. Normal range of motion.     Cervical back: Normal range of motion and neck supple. No rigidity or tenderness.  Lymphadenopathy:     Cervical: No cervical adenopathy.  Skin:    Findings: Rash (Maculopapular rash on the upper trunk noted on his back, no significant change) present.  Neurological:     General: No focal deficit present.     Mental Status: He is alert.  Psychiatric:        Mood and Affect: Mood normal.        Thought Content: Thought content normal.    LABORATORY DATA:  I have reviewed the data as listed Lab Results  Component Value Date   WBC 7.4 07/17/2021   HGB 11.3 (L) 07/17/2021   HCT 32.7 (L) 07/17/2021   MCV 109.4 (H) 07/17/2021   PLT 196 07/17/2021     Chemistry  Component Value Date/Time   NA 141 07/17/2021 1107   K 4.2 07/17/2021 1107   CL 104 07/17/2021 1107   CO2 31 07/17/2021 1107   BUN 11 07/17/2021 1107   CREATININE 1.00 07/17/2021 1107   CREATININE 0.97 04/23/2021 1057   CREATININE 1.22 07/13/2013 1253      Component Value Date/Time   CALCIUM 9.5 07/17/2021 1107   ALKPHOS 51 07/17/2021 1107   AST 20 07/17/2021 1107   AST 39 04/23/2021 1057   ALT 17  07/17/2021 1107   ALT 29 04/23/2021 1057   BILITOT 0.6 07/17/2021 1107   BILITOT 0.4 04/23/2021 1057     Labs from today are pending.  RADIOGRAPHIC STUDIES: I have personally reviewed the radiological images as listed and agreed with the findings in the report. No results found.  All questions were answered. The patient knows to call the clinic with any problems, questions or concerns.     Benay Pike, MD 12/03/2021 3:13 PM

## 2021-12-04 ENCOUNTER — Inpatient Hospital Stay: Payer: 59 | Attending: Hematology and Oncology | Admitting: Hematology and Oncology

## 2021-12-04 ENCOUNTER — Telehealth: Payer: Self-pay

## 2021-12-04 NOTE — Telephone Encounter (Signed)
Attempted to call patient on primary number regarding today's appointment. No answer. Left voicemail providing patient with phone number to reschedule appointments.   

## 2022-02-10 ENCOUNTER — Telehealth: Payer: Self-pay | Admitting: *Deleted

## 2022-02-10 ENCOUNTER — Other Ambulatory Visit: Payer: Self-pay | Admitting: *Deleted

## 2022-02-10 NOTE — Telephone Encounter (Signed)
This RN contacted pt per refill received for IM cyanocobalamin from [facility]. Noted pt has not come in for scheduled follow up of his ferritin and B12 issues.  Per contact Mr Bearse states he has injectable B12 and uses pills as well.  He does admit to missing appts " just hard for me to come in there to your office since my wife and my mother went there "  This RN validated his statements- as well as concern for his diagnosis and need for follow up.  Pt verbalized understanding and agreement - appt made for lab and visit.

## 2022-02-17 ENCOUNTER — Encounter: Payer: Self-pay | Admitting: Hematology and Oncology

## 2022-02-17 ENCOUNTER — Inpatient Hospital Stay: Payer: 59 | Attending: Hematology and Oncology | Admitting: Hematology and Oncology

## 2022-02-17 ENCOUNTER — Inpatient Hospital Stay: Payer: 59

## 2022-02-17 ENCOUNTER — Other Ambulatory Visit: Payer: Self-pay

## 2022-02-17 DIAGNOSIS — Z7982 Long term (current) use of aspirin: Secondary | ICD-10-CM | POA: Insufficient documentation

## 2022-02-17 DIAGNOSIS — Z87891 Personal history of nicotine dependence: Secondary | ICD-10-CM | POA: Insufficient documentation

## 2022-02-17 DIAGNOSIS — G629 Polyneuropathy, unspecified: Secondary | ICD-10-CM | POA: Diagnosis not present

## 2022-02-17 DIAGNOSIS — E538 Deficiency of other specified B group vitamins: Secondary | ICD-10-CM | POA: Diagnosis present

## 2022-02-17 DIAGNOSIS — Z79899 Other long term (current) drug therapy: Secondary | ICD-10-CM | POA: Insufficient documentation

## 2022-02-17 DIAGNOSIS — I10 Essential (primary) hypertension: Secondary | ICD-10-CM | POA: Diagnosis not present

## 2022-02-17 LAB — CMP (CANCER CENTER ONLY)
ALT: 85 U/L — ABNORMAL HIGH (ref 0–44)
AST: 56 U/L — ABNORMAL HIGH (ref 15–41)
Albumin: 3.7 g/dL (ref 3.5–5.0)
Alkaline Phosphatase: 52 U/L (ref 38–126)
Anion gap: 4 — ABNORMAL LOW (ref 5–15)
BUN: 12 mg/dL (ref 8–23)
CO2: 32 mmol/L (ref 22–32)
Calcium: 9.2 mg/dL (ref 8.9–10.3)
Chloride: 104 mmol/L (ref 98–111)
Creatinine: 0.85 mg/dL (ref 0.61–1.24)
GFR, Estimated: >60 mL/min (ref >60)
Glucose, Bld: 84 mg/dL (ref 70–99)
Potassium: 3.8 mmol/L (ref 3.5–5.1)
Sodium: 140 mmol/L (ref 135–145)
Total Bilirubin: 0.4 mg/dL (ref 0.3–1.2)
Total Protein: 6.1 g/dL — ABNORMAL LOW (ref 6.5–8.1)

## 2022-02-17 LAB — CBC WITH DIFFERENTIAL (CANCER CENTER ONLY)
Abs Immature Granulocytes: 0.03 10*3/uL (ref 0.00–0.07)
Basophils Absolute: 0 10*3/uL (ref 0.0–0.1)
Basophils Relative: 0 %
Eosinophils Absolute: 0.2 10*3/uL (ref 0.0–0.5)
Eosinophils Relative: 2 %
HCT: 32.2 % — ABNORMAL LOW (ref 39.0–52.0)
Hemoglobin: 11.2 g/dL — ABNORMAL LOW (ref 13.0–17.0)
Immature Granulocytes: 0 %
Lymphocytes Relative: 18 %
Lymphs Abs: 1.7 10*3/uL (ref 0.7–4.0)
MCH: 36.6 pg — ABNORMAL HIGH (ref 26.0–34.0)
MCHC: 34.8 g/dL (ref 30.0–36.0)
MCV: 105.2 fL — ABNORMAL HIGH (ref 80.0–100.0)
Monocytes Absolute: 0.8 10*3/uL (ref 0.1–1.0)
Monocytes Relative: 8 %
Neutro Abs: 6.5 10*3/uL (ref 1.7–7.7)
Neutrophils Relative %: 72 %
Platelet Count: 154 10*3/uL (ref 150–400)
RBC: 3.06 MIL/uL — ABNORMAL LOW (ref 4.22–5.81)
RDW: 14.2 % (ref 11.5–15.5)
WBC Count: 9.2 10*3/uL (ref 4.0–10.5)
nRBC: 0 % (ref 0.0–0.2)

## 2022-02-17 LAB — FERRITIN: Ferritin: 114 ng/mL (ref 24–336)

## 2022-02-17 LAB — IRON AND IRON BINDING CAPACITY (CC-WL,HP ONLY)
Iron: 78 ug/dL (ref 45–182)
Saturation Ratios: 24 % (ref 17.9–39.5)
TIBC: 323 ug/dL (ref 250–450)
UIBC: 245 ug/dL (ref 117–376)

## 2022-02-17 LAB — VITAMIN B12: Vitamin B-12: 126 pg/mL — ABNORMAL LOW (ref 180–914)

## 2022-02-17 NOTE — Progress Notes (Signed)
[place] NOTE  Patient Care Team: Kristie Cowman, MD as PCP - General (Family Medicine)  CHIEF COMPLAINTS/PURPOSE OF CONSULTATION:  Follow-up about iron overload disorder  ASSESSMENT & PLAN:   This is a pleasant 61 year old male patient with past medical history significant for hypertension, musculoskeletal issues, thyroid disease referred to hematology for evaluation of possible iron overload disorder.  #1.  Iron overload disorder his last labs in June showed a ferritin of 177.   He had last phlebotomy in  August 2022, Last ferritin in July is 62. OK to proceed with phlebotomy if ferritin is greater than 100. Labs from today are pending We will let him know if he needs another phlebotomy. RTC in 4 months otherwise. Encouraged to cut down on red meat and recommended to stay active and minimize alcohol intake.  2.  B12 deficiency, has been taking B12 injections once every 2 weeks. B12 levels pending today  With regards to hypertension and leg swelling as well as depression (no active suicidal ideation), and encouraged him to talk to his PCP since his schedule for a visit in a couple weeks.  He expressed understanding.  HISTORY OF PRESENTING ILLNESS:   Philip Humphrey 61 y.o. 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  This is a very pleasant 61 year old male patient with past medical history significant for hypertension, musculoskeletal issues, peripheral neuropathy who was referred to hematology for evaluation of possible iron overload disorder.   Interval History  He is here for FU by himself.  Since last visit, he has decreased drinking, one beer every 2/3 days. He lost some weight but he has regained  some back.  He went down from 236 lbs to 210 lbs but gained some back. He is now weighing 218 lbs.  He hasnt been eating well, he says he is depressed some. He also reported some leg swelling, has been falling some because of his hips, he says. He has a  PCP appointment in in March. Last phlebotomy in Aug 2022. Taking B12 injections every 2 weeks.  Rest of the pertinent 10 point ROS reviewed and negative.  MEDICAL HISTORY:  Past Medical History:  Diagnosis Date   Allergy    occ. seasonal   Anxiety    Arthritis    Colon polyp    Condyloma    anal   Depression    GERD (gastroesophageal reflux disease)    History of transfusion    as child   Hypertension    Knee injury    right knee cap w piece broken off- MRI done 02-06-15-pending surgery. 11-18-16 right knee is still painful, both shoulders(limited ROM right).   Knee pain    Neuropathy    bilateral- greater left   Palpitations    normal stress test 10 yrs ago   Pneumonia    Thyroid disease    Thyroid removed unintentionally at age 17- no further thyroid tissue remains-uses daily supplement    SURGICAL HISTORY: Past Surgical History:  Procedure Laterality Date   COLONOSCOPY     COLONOSCOPY N/A 02/15/2015   Procedure: COLONOSCOPY;  Surgeon: Alphonsa Overall, MD;  Location: [facility];  Service: General;  Laterality: N/A;   cyst removed     from neck / abdomed   EXAMINATION UNDER ANESTHESIA N/A 02/15/2015   Procedure: EXAM UNDER ANESTHESIA;  Surgeon: Pedro Earls, MD;  Location: [facility];  Service: General;  Laterality: N/A;   HERNIA REPAIR Left    10 yrs ago  KNEE ARTHROSCOPY Left    x1 and meniscal tear    ROTATOR CUFF REPAIR Right    x1   SHOULDER ARTHROSCOPY W/ ROTATOR CUFF REPAIR Left    THYROIDECTOMY  age 55    TOTAL HIP ARTHROPLASTY Left 11/24/2016   Procedure: LEFT TOTAL HIP ARTHROPLASTY ANTERIOR APPROACH;  Surgeon: Paralee Cancel, MD;  Location: [facility];  Service: Orthopedics;  Laterality: Left;   TOTAL HIP ARTHROPLASTY     Right hip  Dr. Alvan Dame 08/03/17   TOTAL HIP ARTHROPLASTY Right 08/03/2017   Procedure: RIGHT TOTAL HIP ARTHROPLASTY ANTERIOR APPROACH;  Surgeon: Paralee Cancel, MD;  Location: [facility];  Service: Orthopedics;  Laterality: Right;  70 mins   UMBILICAL HERNIA  REPAIR N/A 10/18/2017   Procedure: Umbilical hernia repair  and excision of lipomas;  Surgeon: Johnathan Hausen, MD;  Location: [facility];  Service: General;  Laterality: N/A;   WART FULGURATION N/A 02/15/2015   Procedure: REMOVAL OF ANAL TAGS,CONDYLOMA;  Surgeon: Pedro Earls, MD;  Location: [facility];  Service: General;  Laterality: N/A;   WRIST SURGERY     x2- left (repair tendon/ ligament)    SOCIAL HISTORY: Social History   Socioeconomic History   Marital status: Married    Spouse name: Not on file   Number of children: Not on file   Years of education: Not on file   Highest education level: Not on file  Occupational History   Not on file  Tobacco Use   Smoking status: Former    Types: Cigars   Smokeless tobacco: Never   Tobacco comments:    occasional quit 2012  Vaping Use   Vaping Use: Never used  Substance and Sexual Activity   Alcohol use: Yes    Comment: drinking liquor each night recently   Drug use: No   Sexual activity: Yes  Other Topics Concern   Not on file  Social History Narrative   Not on file   Social Determinants of Health   Financial Resource Strain: Not on file  Food Insecurity: Not on file  Transportation Needs: Not on file  Physical Activity: Not on file  Stress: Not on file  Social Connections: Not on file  Intimate Partner Violence: Not on file    FAMILY HISTORY: Family History  Problem Relation Age of Onset   Heart disease Mother            Lung disease Brother    Stroke Father     ALLERGIES:  is allergic to sulfa antibiotics and oxycodone.  MEDICATIONS:  Current Outpatient Medications  Medication Sig Dispense Refill   acamprosate (CAMPRAL) 333 MG tablet Take 2 tablets (666 mg total) by mouth 3 (three) times daily with meals. 90 tablet 0   albuterol (VENTOLIN HFA) 108 (90 Base) MCG/ACT inhaler Inhale 2 puffs into the lungs every 6 (six) hours as needed for wheezing or shortness of breath.     amoxicillin (AMOXIL) 500 MG capsule  Take 2,000 mg by mouth See admin instructions. Take 4 capsules (2000 mg) by mouth one hour prior to dental appointments     aspirin EC 81 MG tablet Take 1 tablet (81 mg total) by mouth daily. Swallow whole. 365 tablet 0   atorvastatin (LIPITOR) 80 MG tablet Take 80 mg by mouth daily.     bismuth subsalicylate (PEPTO BISMOL) 262 MG/15ML suspension Take by mouth every 6 (six) hours as needed for indigestion (stomach pain).     calcium carbonate (TUMS - DOSED IN MG ELEMENTAL  CALCIUM) 500 MG chewable tablet Chew 2 tablets by mouth at bedtime as needed for indigestion or heartburn.     desloratadine (CLARINEX) 5 MG tablet Take 10 mg by mouth daily.     diclofenac Sodium (VOLTAREN) 1 % GEL Apply 1 application topically 2 (two) times daily as needed (pain).     docusate sodium (COLACE) 100 MG capsule Take 1 capsule (100 mg total) by mouth 2 (two) times daily. (Patient not taking: No sig reported) 10 capsule 0   doxepin (SINEQUAN) 10 MG capsule Take 10 mg by mouth at bedtime.     famotidine (PEPCID) 40 MG tablet Take 40 mg by mouth daily.     folic acid (FOLVITE) 1 MG tablet TAKE 1 TABLET BY MOUTH EVERY DAY (Patient taking differently: Take 1 mg by mouth daily.) 90 tablet 1   gabapentin (NEURONTIN) 300 MG capsule Take 300-600 mg by mouth See admin instructions. Take 1 capsule (300 mg) in the morning & 2 capsules (600 mg) at night.     hydrOXYzine (ATARAX/VISTARIL) 25 MG tablet Take 25 mg by mouth 2 (two) times daily. For itching     lisinopril (ZESTRIL) 20 MG tablet Take 1 tablet (20 mg total) by mouth daily.     Magnesium 400 MG TABS Take 400 mg by mouth daily.     meclizine (ANTIVERT) 12.5 MG tablet Take 12.5 mg by mouth 2 (two) times daily as needed (vertigo).     montelukast (SINGULAIR) 10 MG tablet Take 10 mg by mouth daily.     nitroGLYCERIN (NITROSTAT) 0.4 MG SL tablet Place 0.4 mg under the tongue every 5 (five) minutes as needed for chest pain.     Olopatadine HCl (PATADAY OP) Place 1 drop into  both eyes 2 (two) times daily as needed (itching/allergies).     sertraline (ZOLOFT) 25 MG tablet Take 25 mg by mouth at bedtime.     SYNTHROID 175 MCG tablet Take 1 tablet (175 mcg total) by mouth daily. NO MORE REFILLS WITHOUT OFFICE VISIT - 2ND NOTICE 15 tablet 0   Vitamin D, Ergocalciferol, (DRISDOL) 1.25 MG (50000 UNIT) CAPS capsule Take 50,000 Units by mouth once a week.     No current facility-administered medications for this visit.    PHYSICAL EXAMINATION: ECOG PERFORMANCE STATUS: 0 - Asymptomatic  Vitals:   02/17/22 1032  BP: 139/87  Pulse: 78  Resp: 20  Temp: (!) 97.5 F (36.4 C)  SpO2: 99%   Filed Weights   02/17/22 1032  Weight: 218 lb 4.8 oz (99 kg)    Physical Exam Constitutional:      Appearance: Normal appearance.  HENT:     Head: Normocephalic and atraumatic.  Cardiovascular:     Rate and Rhythm: Regular rhythm.     Pulses: Normal pulses.     Heart sounds: Normal heart sounds.  Pulmonary:     Effort: Pulmonary effort is normal.     Breath sounds: Normal breath sounds.  Abdominal:     General: Abdomen is flat. Bowel sounds are normal.     Palpations: Abdomen is soft.  Musculoskeletal:        General: No swelling or tenderness. Normal range of motion.     Cervical back: Normal range of motion and neck supple. No rigidity or tenderness.  Lymphadenopathy:     Cervical: No cervical adenopathy.  Skin:    Findings: No rash.  Neurological:     General: No focal deficit present.     Mental Status: He  is alert.  Psychiatric:        Mood and Affect: Mood normal.        Thought Content: Thought content normal.    LABORATORY DATA:  I have reviewed the data as listed Lab Results  Component Value Date   WBC 9.2 02/17/2022   HGB 11.2 (L) 02/17/2022   HCT 32.2 (L) 02/17/2022   MCV 105.2 (H) 02/17/2022   PLT 154 02/17/2022     Chemistry      Component Value Date/Time   NA 141 07/17/2021 1107   K 4.2 07/17/2021 1107   CL 104 07/17/2021 1107   CO2  31 07/17/2021 1107   BUN 11 07/17/2021 1107   CREATININE 1.00 07/17/2021 1107   CREATININE 0.97 04/23/2021 1057   CREATININE 1.22 07/13/2013 1253      Component Value Date/Time   CALCIUM 9.5 07/17/2021 1107   ALKPHOS 51 07/17/2021 1107   AST 20 07/17/2021 1107   AST 39 04/23/2021 1057   ALT 17 07/17/2021 1107   ALT 29 04/23/2021 1057   BILITOT 0.6 07/17/2021 1107   BILITOT 0.4 04/23/2021 1057     CBC reviewed, Hb stable. CMP reviewed no concerns. Iron panel, ferritin and B12 levels are pending.  RADIOGRAPHIC STUDIES: I have personally reviewed the radiological images as listed and agreed with the findings in the report. No results found.  All questions were answered. The patient knows to call the clinic with any problems, questions or concerns. I spent 30 minutes in the care of this patient including H and P, review of records, counseling and coordination of care as well as documentation  Thank you for consulting [place] in the care of this patient.  Please do not hesitate to contact us with any questions or concerns.    Benay Pike, MD 02/17/2022 10:51 AM

## 2022-02-23 ENCOUNTER — Telehealth: Payer: Self-pay | Admitting: *Deleted

## 2022-02-23 NOTE — Telephone Encounter (Addendum)
-----   Message from Benay Pike, MD sent at 02/20/2022  4:52 PM EST ----- ?B12 low again, please ask the patient to change the Vit B 12 once a week. ? ?Thanks ? ?This RN called pt and obtained  identified VM- detailed message left per above with request to return call to verify he understands change in therapy as well as need to know if new prescription needs to be sent to his pharmacy. ? ? ?

## 2022-03-03 ENCOUNTER — Encounter (HOSPITAL_COMMUNITY): Payer: Self-pay | Admitting: *Deleted

## 2022-03-03 ENCOUNTER — Other Ambulatory Visit: Payer: Self-pay

## 2022-03-03 ENCOUNTER — Inpatient Hospital Stay (HOSPITAL_COMMUNITY)
Admission: EM | Admit: 2022-03-03 | Discharge: 2022-03-05 | DRG: 683 | Discharge status: Against Medical Advice | Payer: 59 | Attending: Internal Medicine | Admitting: Internal Medicine

## 2022-03-03 DIAGNOSIS — E538 Deficiency of other specified B group vitamins: Secondary | ICD-10-CM | POA: Diagnosis present

## 2022-03-03 DIAGNOSIS — Z20822 Contact with and (suspected) exposure to covid-19: Secondary | ICD-10-CM | POA: Diagnosis present

## 2022-03-03 DIAGNOSIS — R197 Diarrhea, unspecified: Secondary | ICD-10-CM | POA: Diagnosis present

## 2022-03-03 DIAGNOSIS — F32A Depression, unspecified: Secondary | ICD-10-CM | POA: Diagnosis present

## 2022-03-03 DIAGNOSIS — Z823 Family history of stroke: Secondary | ICD-10-CM

## 2022-03-03 DIAGNOSIS — E871 Hypo-osmolality and hyponatremia: Secondary | ICD-10-CM

## 2022-03-03 DIAGNOSIS — F1010 Alcohol abuse, uncomplicated: Secondary | ICD-10-CM | POA: Diagnosis present

## 2022-03-03 DIAGNOSIS — Z7982 Long term (current) use of aspirin: Secondary | ICD-10-CM

## 2022-03-03 DIAGNOSIS — Z801 Family history of malignant neoplasm of trachea, bronchus and lung: Secondary | ICD-10-CM

## 2022-03-03 DIAGNOSIS — D696 Thrombocytopenia, unspecified: Secondary | ICD-10-CM

## 2022-03-03 DIAGNOSIS — E8721 Acute metabolic acidosis: Secondary | ICD-10-CM | POA: Diagnosis not present

## 2022-03-03 DIAGNOSIS — R7989 Other specified abnormal findings of blood chemistry: Secondary | ICD-10-CM

## 2022-03-03 DIAGNOSIS — D7589 Other specified diseases of blood and blood-forming organs: Secondary | ICD-10-CM

## 2022-03-03 DIAGNOSIS — E860 Dehydration: Secondary | ICD-10-CM | POA: Diagnosis present

## 2022-03-03 DIAGNOSIS — Z87891 Personal history of nicotine dependence: Secondary | ICD-10-CM

## 2022-03-03 DIAGNOSIS — N179 Acute kidney failure, unspecified: Secondary | ICD-10-CM | POA: Diagnosis not present

## 2022-03-03 DIAGNOSIS — Z8249 Family history of ischemic heart disease and other diseases of the circulatory system: Secondary | ICD-10-CM

## 2022-03-03 DIAGNOSIS — R112 Nausea with vomiting, unspecified: Secondary | ICD-10-CM | POA: Diagnosis present

## 2022-03-03 DIAGNOSIS — Z79890 Hormone replacement therapy (postmenopausal): Secondary | ICD-10-CM

## 2022-03-03 DIAGNOSIS — R110 Nausea: Secondary | ICD-10-CM

## 2022-03-03 DIAGNOSIS — Z882 Allergy status to sulfonamides status: Secondary | ICD-10-CM

## 2022-03-03 DIAGNOSIS — I10 Essential (primary) hypertension: Secondary | ICD-10-CM | POA: Diagnosis present

## 2022-03-03 DIAGNOSIS — Z885 Allergy status to narcotic agent status: Secondary | ICD-10-CM

## 2022-03-03 DIAGNOSIS — Z5329 Procedure and treatment not carried out because of patient's decision for other reasons: Secondary | ICD-10-CM | POA: Diagnosis not present

## 2022-03-03 DIAGNOSIS — Z79899 Other long term (current) drug therapy: Secondary | ICD-10-CM

## 2022-03-03 DIAGNOSIS — E039 Hypothyroidism, unspecified: Secondary | ICD-10-CM | POA: Diagnosis present

## 2022-03-03 DIAGNOSIS — E876 Hypokalemia: Secondary | ICD-10-CM

## 2022-03-03 LAB — COMPREHENSIVE METABOLIC PANEL
ALT: 56 U/L — ABNORMAL HIGH (ref 0–44)
AST: 61 U/L — ABNORMAL HIGH (ref 15–41)
Albumin: 4.6 g/dL (ref 3.5–5.0)
Alkaline Phosphatase: 64 U/L (ref 38–126)
Anion gap: 20 — ABNORMAL HIGH (ref 5–15)
BUN: 44 mg/dL — ABNORMAL HIGH (ref 8–23)
CO2: 20 mmol/L — ABNORMAL LOW (ref 22–32)
Calcium: 10.8 mg/dL — ABNORMAL HIGH (ref 8.9–10.3)
Chloride: 92 mmol/L — ABNORMAL LOW (ref 98–111)
Creatinine, Ser: 3.96 mg/dL — ABNORMAL HIGH (ref 0.61–1.24)
GFR, Estimated: 16 mL/min — ABNORMAL LOW (ref >60)
Glucose, Bld: 111 mg/dL — ABNORMAL HIGH (ref 70–99)
Potassium: 3.4 mmol/L — ABNORMAL LOW (ref 3.5–5.1)
Sodium: 132 mmol/L — ABNORMAL LOW (ref 135–145)
Total Bilirubin: 1.1 mg/dL (ref 0.3–1.2)
Total Protein: 7.6 g/dL (ref 6.5–8.1)

## 2022-03-03 LAB — CBC
HCT: 43.5 % (ref 39.0–52.0)
Hemoglobin: 15.3 g/dL (ref 13.0–17.0)
MCH: 36.4 pg — ABNORMAL HIGH (ref 26.0–34.0)
MCHC: 35.2 g/dL (ref 30.0–36.0)
MCV: 103.6 fL — ABNORMAL HIGH (ref 80.0–100.0)
Platelets: 127 10*3/uL — ABNORMAL LOW (ref 150–400)
RBC: 4.2 MIL/uL — ABNORMAL LOW (ref 4.22–5.81)
RDW: 13.6 % (ref 11.5–15.5)
WBC: 10.7 10*3/uL — ABNORMAL HIGH (ref 4.0–10.5)
nRBC: 0 % (ref 0.0–0.2)

## 2022-03-03 LAB — RESP PANEL BY RT-PCR (FLU A&B, COVID) ARPGX2
Influenza A by PCR: NEGATIVE
Influenza B by PCR: NEGATIVE
SARS Coronavirus 2 by RT PCR: NEGATIVE

## 2022-03-03 LAB — LIPASE, BLOOD: Lipase: 48 U/L (ref 11–51)

## 2022-03-03 MED ORDER — ACETAMINOPHEN 650 MG RE SUPP: 650.0000 mg | Freq: Four times a day (QID) | RECTAL | Status: DC | PRN

## 2022-03-03 MED ORDER — POTASSIUM CHLORIDE IN NACL 20-0.9 MEQ/L-% IV SOLN
INTRAVENOUS | Status: DC
  Filled 2022-03-03 (×5): qty 1000

## 2022-03-03 MED ORDER — GABAPENTIN 300 MG PO CAPS
300.0000 mg | ORAL_CAPSULE | Freq: Once | ORAL | Status: AC
  Administered 2022-03-03: 300 mg via ORAL
  Filled 2022-03-03: qty 1

## 2022-03-03 MED ORDER — ONDANSETRON HCL 4 MG/2ML IJ SOLN
4.0000 mg | Freq: Once | INTRAMUSCULAR | Status: AC
  Administered 2022-03-03: 4 mg via INTRAVENOUS
  Filled 2022-03-03: qty 2

## 2022-03-03 MED ORDER — ALBUTEROL SULFATE (2.5 MG/3ML) 0.083% IN NEBU: 3.0000 mL | INHALATION_SOLUTION | Freq: Four times a day (QID) | RESPIRATORY_TRACT | Status: DC | PRN

## 2022-03-03 MED ORDER — SODIUM CHLORIDE 0.9 % IV BOLUS
1000.0000 mL | Freq: Once | INTRAVENOUS | Status: AC
  Administered 2022-03-03: 1000 mL via INTRAVENOUS

## 2022-03-03 MED ORDER — ONDANSETRON HCL 4 MG PO TABS: 4.0000 mg | ORAL_TABLET | Freq: Four times a day (QID) | ORAL | Status: DC | PRN

## 2022-03-03 MED ORDER — MELATONIN 3 MG PO TABS
3.0000 mg | ORAL_TABLET | Freq: Every day | ORAL | Status: AC
  Administered 2022-03-03 – 2022-03-04 (×2): 3 mg via ORAL
  Filled 2022-03-03 (×2): qty 1

## 2022-03-03 MED ORDER — ONDANSETRON HCL 4 MG/2ML IJ SOLN: 4.0000 mg | Freq: Four times a day (QID) | INTRAMUSCULAR | Status: DC | PRN

## 2022-03-03 MED ORDER — FOLIC ACID 1 MG PO TABS
1.0000 mg | ORAL_TABLET | Freq: Every day | ORAL | Status: DC
  Administered 2022-03-04: 1 mg via ORAL
  Filled 2022-03-03: qty 1

## 2022-03-03 MED ORDER — ACETAMINOPHEN 325 MG PO TABS
650.0000 mg | ORAL_TABLET | Freq: Four times a day (QID) | ORAL | Status: DC | PRN
Start: 2022-03-03 — End: 2022-03-05
  Administered 2022-03-04: 650 mg via ORAL
  Filled 2022-03-03: qty 2

## 2022-03-03 MED ORDER — ADULT MULTIVITAMIN W/MINERALS CH
1.0000 | ORAL_TABLET | Freq: Every day | ORAL | Status: DC
  Administered 2022-03-04: 1 via ORAL
  Filled 2022-03-03: qty 1

## 2022-03-03 MED ORDER — PANTOPRAZOLE SODIUM 40 MG IV SOLR
40.0000 mg | Freq: Two times a day (BID) | INTRAVENOUS | Status: DC
  Administered 2022-03-03 – 2022-03-04 (×3): 40 mg via INTRAVENOUS
  Filled 2022-03-03 (×3): qty 10

## 2022-03-03 MED ORDER — HEPARIN SODIUM (PORCINE) 5000 UNIT/ML IJ SOLN
5000.0000 [IU] | Freq: Three times a day (TID) | INTRAMUSCULAR | Status: DC
  Administered 2022-03-03 – 2022-03-05 (×5): 5000 [IU] via SUBCUTANEOUS
  Filled 2022-03-03 (×5): qty 1

## 2022-03-03 MED ORDER — THIAMINE HCL 100 MG PO TABS
100.0000 mg | ORAL_TABLET | Freq: Every day | ORAL | Status: DC
  Administered 2022-03-04: 100 mg via ORAL
  Filled 2022-03-03: qty 1

## 2022-03-03 NOTE — ED Triage Notes (Signed)
Per EMS-N/V/D for 3 days-patient is a binge drinking-history of pancreatitis ?

## 2022-03-03 NOTE — ED Provider Notes (Signed)
?[facility] DEPT ?Provider Note ? ? ?CSN: [id number] ?Arrival date & time: 03/03/22  1358 ? ?  ? ?History ? ?Chief Complaint  ?Patient presents with  ? Emesis  ? Diarrhea  ? Nausea  ? ? ?Philip Humphrey is a 61 y.o. male. ? ?61 year old male with prior medical history as detailed below presents for evaluation.  Patient with significant nausea, vomiting, diarrhea.  Symptoms began 3 days ago.  He denies being able to keep any liquids down for the last 72 hours. ? ?He denies pain.  He denies chest pain or shortness of breath.  He denies abdominal pain.  He denies fever. ? ?He reports loose watery stool. ? ?The history is provided by the patient and medical records.  ?Emesis ?Severity:  Severe ?Duration:  3 days ?Timing:  Constant ?Progression:  Unchanged ?Chronicity:  New ?Associated symptoms: diarrhea   ?Diarrhea ?Associated symptoms: vomiting   ? ?  ? ?Home Medications ?Prior to Admission medications   ?Medication Sig Start Date End Date Taking? Authorizing Provider  ?cyanocobalamin (,VITAMIN B-12,) 1000 MCG/ML injection Inject 1,000 mcg into the muscle once a week. 01/10/22  Yes [provider]  ?hydrochlorothiazide (HYDRODIURIL) 50 MG tablet Take 50 mg by mouth daily. 02/18/22  Yes [provider]  ?ondansetron (ZOFRAN-ODT) 8 MG disintegrating tablet Take 8 mg by mouth 3 (three) times daily as needed. 10/09/21  Yes [provider]  ?sertraline (ZOLOFT) 50 MG tablet Take 50 mg by mouth daily. 01/26/22  Yes [provider]  ?acamprosate (CAMPRAL) 333 MG tablet Take 2 tablets (666 mg total) by mouth 3 (three) times daily with meals. 07/09/21   Mercy Riding, MD  ?albuterol (VENTOLIN HFA) 108 (90 Base) MCG/ACT inhaler Inhale 2 puffs into the lungs every 6 (six) hours as needed for wheezing or shortness of breath.    [provider]  ?amoxicillin (AMOXIL) 500 MG capsule Take 2,000 mg by mouth See admin instructions. Take 4 capsules (2000 mg) by  mouth one hour prior to dental appointments    [provider]  ?aspirin EC 81 MG tablet Take 1 tablet (81 mg total) by mouth daily. Swallow whole. 07/09/21 07/09/22  Mercy Riding, MD  ?atorvastatin (LIPITOR) 80 MG tablet Take 80 mg by mouth daily. 10/09/16   [provider]  ?bismuth subsalicylate (PEPTO BISMOL) 262 MG/15ML suspension Take 30 mLs by mouth every 6 (six) hours as needed for indigestion (stomach pain).    [provider]  ?calcium carbonate (TUMS - DOSED IN MG ELEMENTAL CALCIUM) 500 MG chewable tablet Chew 2 tablets by mouth at bedtime as needed for indigestion or heartburn.    [provider]  ?desloratadine (CLARINEX) 5 MG tablet Take 10 mg by mouth daily. 12/25/20   [provider]  ?diclofenac Sodium (VOLTAREN) 1 % GEL Apply 1 application topically 2 (two) times daily as needed (pain).    [provider]  ?docusate sodium (COLACE) 100 MG capsule Take 1 capsule (100 mg total) by mouth 2 (two) times daily. ?Patient not taking: Reported on 10/12/2017 11/25/16   Danae Orleans, PA-C  ?doxepin (SINEQUAN) 10 MG capsule Take 10 mg by mouth at bedtime. 06/17/21   [provider]  ?famotidine (PEPCID) 40 MG tablet Take 40 mg by mouth daily. 01/25/21   [provider]  ?folic acid (FOLVITE) 1 MG tablet TAKE 1 TABLET BY MOUTH EVERY DAY ?Patient taking differently: Take 1 mg by mouth daily. 04/27/21   Benay Pike, MD  ?  gabapentin (NEURONTIN) 300 MG capsule Take 300-600 mg by mouth See admin instructions. Take 1 capsule by mouth (300 mg) in the morning & 2 capsules by mouth  (600 mg) at night.    [provider]  ?hydrOXYzine (ATARAX/VISTARIL) 25 MG tablet Take 25 mg by mouth 2 (two) times daily. For itching 06/17/21   [provider]  ?lisinopril (ZESTRIL) 20 MG tablet Take 1 tablet (20 mg total) by mouth daily. 07/16/21   Mercy Riding, MD  ?Magnesium 400 MG TABS Take 400 mg by mouth daily.    [provider]   ?meclizine (ANTIVERT) 12.5 MG tablet Take 12.5 mg by mouth 2 (two) times daily as needed for dizziness. 05/06/21   [provider]  ?montelukast (SINGULAIR) 10 MG tablet Take 10 mg by mouth daily. 05/28/21   [provider]  ?nitroGLYCERIN (NITROSTAT) 0.4 MG SL tablet Place 0.4 mg under the tongue every 5 (five) minutes as needed for chest pain.    [provider]  ?Olopatadine HCl (PATADAY OP) Place 1 drop into both eyes 2 (two) times daily as needed (itching/allergies).    [provider]  ?sertraline (ZOLOFT) 25 MG tablet Take 25 mg by mouth at bedtime. 06/07/17   [provider]  ?SYNTHROID 175 MCG tablet Take 1 tablet (175 mcg total) by mouth daily. NO MORE REFILLS WITHOUT OFFICE VISIT - 2ND NOTICE ?Patient taking differently: Take 175 mcg by mouth daily. 11/14/14   Leandrew Koyanagi, MD  ?Vitamin D, Ergocalciferol, (DRISDOL) 1.25 MG (50000 UNIT) CAPS capsule Take 50,000 Units by mouth every 7 (seven) days. 05/14/21   [provider]  ?   ? ?Allergies    ?Sulfa antibiotics and Oxycodone   ? ?Review of Systems   ?Review of Systems  ?Gastrointestinal:  Positive for diarrhea and vomiting.  ?All other systems reviewed and are negative. ? ?Physical Exam ?Updated Vital Signs ?BP 103/85 (BP Location: Left Arm)   Pulse (!) 119   Temp 98.2 ?F (36.8 ?C) (Oral)   Resp 17   Ht 5' 9.5" (1.765 m)   Wt 81.6 kg   SpO2 99%   BMI 26.20 kg/m?  ?Physical Exam ?Vitals and nursing note reviewed.  ?Constitutional:   ?   General: He is not in acute distress. ?   Appearance: Normal appearance. He is well-developed.  ?HENT:  ?   Head: Normocephalic and atraumatic.  ?   Mouth/Throat:  ?   Mouth: Mucous membranes are dry.  ?Eyes:  ?   Conjunctiva/sclera: Conjunctivae normal.  ?   Pupils: Pupils are equal, round, and reactive to light.  ?Cardiovascular:  ?   Rate and Rhythm: Normal rate and regular rhythm.  ?   Heart sounds: Normal heart sounds.  ?Pulmonary:  ?   Effort: Pulmonary  effort is normal. No respiratory distress.  ?   Breath sounds: Normal breath sounds.  ?Abdominal:  ?   General: There is no distension.  ?   Palpations: Abdomen is soft.  ?   Tenderness: There is no abdominal tenderness.  ?Musculoskeletal:     ?   General: No deformity. Normal range of motion.  ?   Cervical back: Normal range of motion and neck supple.  ?Skin: ?   General: Skin is warm and dry.  ?Neurological:  ?   General: No focal deficit present.  ?   Mental Status: He is alert and oriented to person, place, and time.  ? ? ?ED Results / Procedures / Treatments   ?  Labs ?(all labs ordered are listed, but only abnormal results are displayed) ?Labs Reviewed  ?COMPREHENSIVE METABOLIC PANEL - Abnormal; Notable for the following components:  ?    Result Value  ? Sodium 132 (*)   ? Potassium 3.4 (*)   ? Chloride 92 (*)   ? CO2 20 (*)   ? Glucose, Bld 111 (*)   ? BUN 44 (*)   ? Creatinine, Ser 3.96 (*)   ? Calcium 10.8 (*)   ? AST 61 (*)   ? ALT 56 (*)   ? GFR, Estimated 16 (*)   ? Anion gap 20 (*)   ? All other components within normal limits  ?CBC - Abnormal; Notable for the following components:  ? WBC 10.7 (*)   ? RBC 4.20 (*)   ? MCV 103.6 (*)   ? MCH 36.4 (*)   ? Platelets 127 (*)   ? All other components within normal limits  ?RESP PANEL BY RT-PCR (FLU A&B, COVID) ARPGX2  ?GASTROINTESTINAL PANEL BY PCR, STOOL (REPLACES STOOL CULTURE)  ?C DIFFICILE QUICK SCREEN W PCR REFLEX    ?LIPASE, BLOOD  ?URINALYSIS, ROUTINE W REFLEX MICROSCOPIC  ?CBC  ?COMPREHENSIVE METABOLIC PANEL  ?MAGNESIUM  ? ? ?EKG ?None ? ?Radiology ?No results found. ? ?Procedures ?Procedures  ? ? ?Medications Ordered in ED ?Medications  ?albuterol (VENTOLIN HFA) 108 (90 Base) MCG/ACT inhaler 2 puff (has no administration in time range)  ?heparin injection 5,000 Units (has no administration in time range)  ?0.9 % NaCl with KCl 20 mEq/ L  infusion (has no administration in time range)  ?acetaminophen (TYLENOL) tablet 650 mg (has no administration in time  range)  ?  Or  ?acetaminophen (TYLENOL) suppository 650 mg (has no administration in time range)  ?ondansetron (ZOFRAN) tablet 4 mg (has no administration in time range)  ?  Or  ?ondansetron (ZOFRAN) injection 4 mg (

## 2022-03-03 NOTE — ED Notes (Signed)
Urine requested  ?

## 2022-03-03 NOTE — ED Notes (Signed)
Patient's daughter reports that her father has an alcohol issue and would like for her father to have a list of options for possible rehab. ?

## 2022-03-03 NOTE — H&P (Signed)
History and Physical    Patient: Philip Humphrey XLK:[id number] DOB: 04-Jan-1961 DOA: 03/03/2022 DOS: the patient was seen and examined on 03/03/2022 [facility]: [facility]  Patient coming from: Home  Chief Complaint:  Nausea, vomiting and diarrhea  HPI: Philip Humphrey is a 61 y.o. male with medical history significant of alcohol abuse, hypertension, alcoholic pancreatitis, hypothyroidism, depression, B12 deficiency, iron overload disorder followed by hematology/Dr. Al Pimple presented with worsening nausea, vomiting and diarrhea for the last few days.  He has been having mostly watery diarrhea along with nausea and vomiting with decreased oral intake, abdominal pain but denies fever, chest pain, shortness of breath, loss of consciousness, seizures, dysuria or hematuria.    ED course:  He was found to have creatinine of 3.96, creatinine was 0.85 on 02/17/2022 along with sodium of 132, potassium 3.4, AST 61, ALT 56, total bili of 1.1, WBC 10.7, hemoglobin 15.3, hematocrit 43.5, MCV 103.6.  Stool studies were ordered.  He was started on IV fluids.  Hospitalist service was called to evaluate the patient.   Review of Systems: As mentioned in the history of present illness. All other systems reviewed and are negative. Past Medical History:  Diagnosis Date   Allergy    occ. seasonal   Anxiety    Arthritis    Colon polyp    Condyloma    anal   Depression    GERD (gastroesophageal reflux disease)    History of transfusion    as child   Hypertension    Knee injury    right knee cap w piece broken off- MRI done 02-06-15-pending surgery. 11-18-16 right knee is still painful, both shoulders(limited ROM right).   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 thyroid tissue remains-uses daily supplement   Past Surgical History:  Procedure  Laterality Date   COLONOSCOPY     COLONOSCOPY N/A 02/15/2015   Procedure: COLONOSCOPY;  Surgeon: Ovidio Kin, MD;  Location: [facility];  Service: General;  Laterality: N/A;   cyst removed     from neck / abdomed   EXAMINATION UNDER ANESTHESIA N/A 02/15/2015   Procedure: EXAM UNDER ANESTHESIA;  Surgeon: Valarie Merino, MD;  Location: [facility];  Service: General;  Laterality: N/A;   HERNIA REPAIR Left    10 yrs ago   KNEE ARTHROSCOPY Left    x1 and meniscal tear    ROTATOR CUFF REPAIR Right    x1   SHOULDER ARTHROSCOPY W/ ROTATOR CUFF REPAIR Left    THYROIDECTOMY  age 61    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 Charlann Boxer 08/03/17   TOTAL HIP ARTHROPLASTY Right 08/03/2017   Procedure: RIGHT TOTAL HIP ARTHROPLASTY ANTERIOR APPROACH;  Surgeon: [name], MD;  Location: [facility];  Service: Orthopedics;  Laterality: Right;  70 mins   UMBILICAL HERNIA REPAIR N/A 10/18/2017   Procedure: Umbilical hernia repair  and excision of lipomas;  Surgeon: Luretha Murphy, MD;  Location: [facility];  Service: General;  Laterality: N/A;   WART FULGURATION N/A 02/15/2015   Procedure: REMOVAL OF ANAL TAGS,CONDYLOMA;  Surgeon: Valarie Merino,  MD;  Location: [facility];  Service: General;  Laterality: N/A;   WRIST SURGERY     x2- left (repair tendon/ ligament)   Social History:  reports that he has quit smoking. His smoking use included cigars. He has never used smokeless tobacco. He reports current alcohol use. He reports that he does not use drugs.  Allergies  Allergen Reactions   Sulfa Antibiotics Anaphylaxis and Swelling    Mouth and throat swelling   Oxycodone Itching    Family History  Problem Relation Age of Onset   Heart disease Mother    Cancer Brother        Lung   Lung disease Brother    Stroke Father     Prior  to Admission medications   Medication Sig Start Date End Date Taking? Authorizing Provider  acamprosate (CAMPRAL) 333 MG tablet Take 2 tablets (666 mg total) by mouth 3 (three) times daily with meals. 07/09/21   Almon Hercules, MD  albuterol (VENTOLIN HFA) 108 (90 Base) MCG/ACT inhaler Inhale 2 puffs into the lungs every 6 (six) hours as needed for wheezing or shortness of breath.    [provider]  amoxicillin (AMOXIL) 500 MG capsule Take 2,000 mg by mouth See admin instructions. Take 4 capsules (2000 mg) by mouth one hour prior to dental appointments    [provider]  aspirin EC 81 MG tablet Take 1 tablet (81 mg total) by mouth daily. Swallow whole. 07/09/21 07/09/22  Almon Hercules, MD  atorvastatin (LIPITOR) 80 MG tablet Take 80 mg by mouth daily. 10/09/16   [provider]  bismuth subsalicylate (PEPTO BISMOL) 262 MG/15ML suspension Take by mouth every 6 (six) hours as needed for indigestion (stomach pain).    [provider]  calcium carbonate (TUMS - DOSED IN MG ELEMENTAL CALCIUM) 500 MG chewable tablet Chew 2 tablets by mouth at bedtime as needed for indigestion or heartburn.    [provider]  desloratadine (CLARINEX) 5 MG tablet Take 10 mg by mouth daily. 12/25/20   [provider]  diclofenac Sodium (VOLTAREN) 1 % GEL Apply 1 application topically 2 (two) times daily as needed (pain).    [provider]  docusate sodium (COLACE) 100 MG capsule Take 1 capsule (100 mg total) by mouth 2 (two) times daily. Patient not taking: No sig reported 11/25/16   Lanney Gins, PA-C  doxepin (SINEQUAN) 10 MG capsule Take 10 mg by mouth at bedtime. 06/17/21   [provider]  famotidine (PEPCID) 40 MG tablet Take 40 mg by mouth daily. 01/25/21   [provider]  folic acid (FOLVITE) 1 MG tablet TAKE 1 TABLET BY MOUTH EVERY DAY Patient taking differently: Take 1 mg by mouth daily. 04/27/21   Rachel Moulds, MD  gabapentin  (NEURONTIN) 300 MG capsule Take 300-600 mg by mouth See admin instructions. Take 1 capsule (300 mg) in the morning & 2 capsules (600 mg) at night.    [provider]  hydrOXYzine (ATARAX/VISTARIL) 25 MG tablet Take 25 mg by mouth 2 (two) times daily. For itching 06/17/21   [provider]  lisinopril (ZESTRIL) 20 MG tablet Take 1 tablet (20 mg total) by mouth daily. 07/16/21   Almon Hercules, MD  Magnesium 400 MG TABS Take 400 mg by mouth daily.    [provider]  meclizine (ANTIVERT) 12.5 MG tablet Take 12.5 mg by mouth 2 (two) times daily as needed (vertigo). 05/06/21   [provider]  montelukast (SINGULAIR)  10 MG tablet Take 10 mg by mouth daily. 05/28/21   [provider]  nitroGLYCERIN (NITROSTAT) 0.4 MG SL tablet Place 0.4 mg under the tongue every 5 (five) minutes as needed for chest pain.    [provider]  Olopatadine HCl (PATADAY OP) Place 1 drop into both eyes 2 (two) times daily as needed (itching/allergies).    [provider]  sertraline (ZOLOFT) 25 MG tablet Take 25 mg by mouth at bedtime. 06/07/17   [provider]  SYNTHROID 175 MCG tablet Take 1 tablet (175 mcg total) by mouth daily. NO MORE REFILLS WITHOUT OFFICE VISIT - 2ND NOTICE 11/14/14   Tonye Pearson, MD  Vitamin D, Ergocalciferol, (DRISDOL) 1.25 MG (50000 UNIT) CAPS capsule Take 50,000 Units by mouth once a week. 05/14/21   [provider]    Physical Exam: Vitals:   03/03/22 1421 03/03/22 1423  BP: 103/85   Pulse: (!) 119   Resp: 17   Temp: 98.2 F (36.8 C)   TempSrc: Oral   SpO2: 99%   Weight:  81.6 kg  Height:  5' 9.5" (1.765 m)   General: No acute distress, currently on room air ENT/neck: No elevated JVD.  No obvious masses  respiratory: Bilateral decreased breath sounds at bases with some scattered crackles CVS: S1-S2 heard, rate controlled Abdominal: Soft, mildly tender in the periumbilical region, nondistended, no  organomegaly, bowel sounds heard Extremities: No cyanosis, clubbing, edema CNS: Alert, awake and oriented.  No focal neurologic deficit.  Moving extremities. Lymph: No cervical lymphadenopathy Skin: No rashes, lesions, ulcers Psych: Affect is mostly flat.  No signs of agitation currently.   Musculoskeletal: No obvious joint deformity/tenderness/swelling  Data Reviewed: I have reviewed patient's investigation during this hospitalization myself.  Today, creatinine of 3.96, creatinine was 0.85 on 02/17/2022 along with sodium of 132, potassium 3.4, AST 61, ALT 56, total bili of 1.1, WBC 10.7, hemoglobin 15.3, hematocrit 43.5, MCV 103.6.   Assessment and Plan:  Acute kidney injury Dehydration Acute metabolic acidosis -Possibly from acute gastroenteritis. -Creatinine 3.96, was 0.85 on 02/09/2022.  Possibly prerenal from dehydration. -Continue IV fluids.  Repeat a.m. labs. -Hold lisinopril  Possible acute gastroenteritis presenting with nausea, vomiting and diarrhea -Most likely viral gastroenteritis.  Rule out bacterial causes as well.  Check stool for GI PCR and C. difficile. -Continue IV fluids and as needed antiemetics.  N.p.o. for now. -Start IV Protonix as well as patient might have underlying gastritis  Alcohol abuse Elevated LFTs Macrocytosis -Patient has history of alcohol abuse and alcoholic pancreatitis but currently not showing signs of withdrawal.  Last alcoholic drink was several days ago as per him. -Monitor LFTs.  Counseled regarding abstinence.  TOC consult. -Start thiamine, multivitamin and folic acid.  Hyponatremia Hypokalemia -Continue IV fluids with supplemental potassium.  Repeat a.m. labs  Hypertension -Monitor blood pressure.  Hold lisinopril.  Depression -Resume home medications in a.m. once medications have been verified.  Outpatient follow-up with psychiatry.  B12 deficiency -Currently on B12 injections as an outpatient.    Advance Care Planning:    Code Status: Prior   Consults: None  Family Communication: None at bedside  Severity of Illness: The appropriate patient status for this patient is OBSERVATION. Observation status is judged to be reasonable and necessary in order to provide the required intensity of service to ensure the patient's safety. The patient's presenting symptoms, physical exam findings, and initial radiographic and laboratory data in the context of their medical condition is felt to place  them at decreased risk for further clinical deterioration. Furthermore, it is anticipated that the patient will be medically stable for discharge from the hospital within 2 midnights of admission.   Author: [name], MD 03/03/2022 5:22 PM  For on call review [email or web address].

## 2022-03-04 DIAGNOSIS — E8721 Acute metabolic acidosis: Secondary | ICD-10-CM | POA: Diagnosis present

## 2022-03-04 DIAGNOSIS — R7989 Other specified abnormal findings of blood chemistry: Secondary | ICD-10-CM | POA: Diagnosis present

## 2022-03-04 DIAGNOSIS — Z885 Allergy status to narcotic agent status: Secondary | ICD-10-CM | POA: Diagnosis not present

## 2022-03-04 DIAGNOSIS — F1010 Alcohol abuse, uncomplicated: Secondary | ICD-10-CM | POA: Diagnosis present

## 2022-03-04 DIAGNOSIS — Z79890 Hormone replacement therapy (postmenopausal): Secondary | ICD-10-CM | POA: Diagnosis not present

## 2022-03-04 DIAGNOSIS — D7589 Other specified diseases of blood and blood-forming organs: Secondary | ICD-10-CM | POA: Diagnosis present

## 2022-03-04 DIAGNOSIS — R112 Nausea with vomiting, unspecified: Secondary | ICD-10-CM | POA: Diagnosis present

## 2022-03-04 DIAGNOSIS — Z5329 Procedure and treatment not carried out because of patient's decision for other reasons: Secondary | ICD-10-CM | POA: Diagnosis not present

## 2022-03-04 DIAGNOSIS — E871 Hypo-osmolality and hyponatremia: Secondary | ICD-10-CM | POA: Diagnosis present

## 2022-03-04 DIAGNOSIS — Z7982 Long term (current) use of aspirin: Secondary | ICD-10-CM | POA: Diagnosis not present

## 2022-03-04 DIAGNOSIS — Z87891 Personal history of nicotine dependence: Secondary | ICD-10-CM | POA: Diagnosis not present

## 2022-03-04 DIAGNOSIS — Z79899 Other long term (current) drug therapy: Secondary | ICD-10-CM | POA: Diagnosis not present

## 2022-03-04 DIAGNOSIS — E538 Deficiency of other specified B group vitamins: Secondary | ICD-10-CM | POA: Diagnosis present

## 2022-03-04 DIAGNOSIS — R197 Diarrhea, unspecified: Secondary | ICD-10-CM | POA: Diagnosis present

## 2022-03-04 DIAGNOSIS — F32A Depression, unspecified: Secondary | ICD-10-CM | POA: Diagnosis present

## 2022-03-04 DIAGNOSIS — D696 Thrombocytopenia, unspecified: Secondary | ICD-10-CM | POA: Diagnosis present

## 2022-03-04 DIAGNOSIS — Z801 Family history of malignant neoplasm of trachea, bronchus and lung: Secondary | ICD-10-CM | POA: Diagnosis not present

## 2022-03-04 DIAGNOSIS — E860 Dehydration: Secondary | ICD-10-CM

## 2022-03-04 DIAGNOSIS — E876 Hypokalemia: Secondary | ICD-10-CM | POA: Diagnosis present

## 2022-03-04 DIAGNOSIS — Z20822 Contact with and (suspected) exposure to covid-19: Secondary | ICD-10-CM | POA: Diagnosis present

## 2022-03-04 DIAGNOSIS — N179 Acute kidney failure, unspecified: Secondary | ICD-10-CM | POA: Diagnosis present

## 2022-03-04 DIAGNOSIS — I10 Essential (primary) hypertension: Secondary | ICD-10-CM | POA: Diagnosis present

## 2022-03-04 DIAGNOSIS — E039 Hypothyroidism, unspecified: Secondary | ICD-10-CM | POA: Diagnosis present

## 2022-03-04 LAB — COMPREHENSIVE METABOLIC PANEL
ALT: 42 U/L (ref 0–44)
AST: 41 U/L (ref 15–41)
Albumin: 3.9 g/dL (ref 3.5–5.0)
Alkaline Phosphatase: 52 U/L (ref 38–126)
Anion gap: 13 (ref 5–15)
BUN: 41 mg/dL — ABNORMAL HIGH (ref 8–23)
CO2: 22 mmol/L (ref 22–32)
Calcium: 9.9 mg/dL (ref 8.9–10.3)
Chloride: 104 mmol/L (ref 98–111)
Creatinine, Ser: 1.86 mg/dL — ABNORMAL HIGH (ref 0.61–1.24)
GFR, Estimated: 41 mL/min — ABNORMAL LOW (ref >60)
Glucose, Bld: 80 mg/dL (ref 70–99)
Potassium: 3.6 mmol/L (ref 3.5–5.1)
Sodium: 139 mmol/L (ref 135–145)
Total Bilirubin: 0.7 mg/dL (ref 0.3–1.2)
Total Protein: 6.3 g/dL — ABNORMAL LOW (ref 6.5–8.1)

## 2022-03-04 LAB — CBC
HCT: 39 % (ref 39.0–52.0)
Hemoglobin: 13 g/dL (ref 13.0–17.0)
MCH: 35.4 pg — ABNORMAL HIGH (ref 26.0–34.0)
MCHC: 33.3 g/dL (ref 30.0–36.0)
MCV: 106.3 fL — ABNORMAL HIGH (ref 80.0–100.0)
Platelets: UNDETERMINED 10*3/uL (ref 150–400)
RBC: 3.67 MIL/uL — ABNORMAL LOW (ref 4.22–5.81)
RDW: 13.6 % (ref 11.5–15.5)
WBC: 5.2 10*3/uL (ref 4.0–10.5)
nRBC: 0 % (ref 0.0–0.2)

## 2022-03-04 LAB — URINALYSIS, ROUTINE W REFLEX MICROSCOPIC
Bilirubin Urine: NEGATIVE
Glucose, UA: NEGATIVE mg/dL
Hgb urine dipstick: NEGATIVE
Ketones, ur: 20 mg/dL — AB
Leukocytes,Ua: NEGATIVE
Nitrite: NEGATIVE
Protein, ur: NEGATIVE mg/dL
Specific Gravity, Urine: 1.015 (ref 1.005–1.030)
pH: 5 (ref 5.0–8.0)

## 2022-03-04 LAB — MAGNESIUM: Magnesium: 1.7 mg/dL (ref 1.7–2.4)

## 2022-03-04 LAB — ETHANOL: Alcohol, Ethyl (B): <10 mg/dL (ref <10)

## 2022-03-04 NOTE — TOC Progression Note (Signed)
Transition of Care (TOC) - Progression Note  ? ? ?Patient Details  ?Name: Philip Humphrey ?MRN: [id number] ?Date of Birth: 1961/09/27 ? ?Transition of Care (TOC) CM/SW Contact  ?Tawanna Cooler, RN ?Phone Number: ?03/04/2022, 3:59 PM ? ?Clinical Narrative:    ? ?Met with patient in his room.  He was walking around the room with his IV pole.  Offered substance abuse resources and he accepted.  Patient's daughter came into room while TOC CM there.  Explained that [facility] doesn't set up any substance abuse inpatient or outpatient services, it's up to the patient to arrange services.  Both daughter and patient verbalized understanding.  ? ? ?Expected Discharge Plan and Services ? Home ?  ?  ?Social Determinants of Health (SDOH) Interventions ?  ? ?Readmission Risk Interventions ?No flowsheet data found. ? ?

## 2022-03-04 NOTE — Plan of Care (Signed)
  Problem: Nutrition: Goal: Adequate nutrition will be maintained Outcome: Progressing   Problem: Elimination: Goal: Will not experience complications related to bowel motility Outcome: Progressing   

## 2022-03-04 NOTE — Progress Notes (Signed)
Pt is hight fall risk reported falling at home prior to admission. He has refused to alert staff when getting out of bed, pt has been educated on fall prevention, risk, and associated injuries. Pt is AxOx4 and continues to be noncompliant. Safety measures have been implemented.  ? ?Charge nurse made aware.   ?

## 2022-03-04 NOTE — Progress Notes (Signed)
?PROGRESS NOTE ? ? ? ?Philip Humphrey  WNI:[id number] DOB: 16-Jun-1961 DOA: 03/03/2022 ?PCP: [facility]  ? ? ? ?Brief Narrative:  ?61 y.o. WM, PMHx Alcohol abuse, hypertension, alcoholic pancreatitis, hypothyroidism, depression, B12 deficiency, iron overload disorder (Hemochromatosis) Hematology/Dr. Chryl Heck  ? ?Presented with worsening nausea, vomiting and diarrhea for the last few days.  He has been having mostly watery diarrhea along with nausea and vomiting with decreased oral intake, abdominal pain but denies fever, chest pain, shortness of breath, loss of consciousness, seizures, dysuria or hematuria.   ?  ?ED course:  ?He was found to have creatinine of 3.96, creatinine was 0.85 on 02/17/2022 along with sodium of 132, potassium 3.4, AST 61, ALT 56, total bili of 1.1, WBC 10.7, hemoglobin 15.3, hematocrit 43.5, MCV 103.6.  Stool studies were ordered.  He was started on IV fluids.  Hospitalist service was called to evaluate the patient. ? ?Subjective: ?A/O x4 patient very eager not to address underlying problem of EtOH abuse, EtOH pancreatitis, hemochromatosis. ? ? ?Assessment & Plan: ?Covid vaccination; ?  ?Principal Problem: ?  AKI (acute kidney injury) ([place]) ?Active Problems: ?  Alcohol abuse ?  Hyponatremia ?  Hypokalemia ?  Acute metabolic acidosis ?  Elevated LFTs ?  Macrocytosis ?  Thrombocytopenia ([place]) ?  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 ?Acute kidney injury ?Lab Results  ?Component Value Date  ? CREATININE 1.86 (H) 03/04/2022  ? CREATININE 3.96 (H) 03/03/2022  ? CREATININE 0.85 02/17/2022  ? CREATININE 1.00 07/17/2021  ? CREATININE 1.58 (H) 07/09/2021  ?-Improving with hydration ?-Hold nephrotoxic meds ?  ?Acute gastroenteritis vs pancreatic insufficiency  ?-3/15 advance patient's diet to bland ?- 3/15 GI stool by PCR pending ?- Protonix IV ?- 3/15 lipase pending ?- If patient has diarrhea with bland diet and no infective cause found may start patient on Creon  ? ?Elevated liver enzymes ?- Most  likely multifactorial to include alcohol abuse, Hemochromatosis ?-Consider MRI liver protocol ([place]?) , 07/07/2021 CT abdomen pelvis showed 8 mm lesion RIGHT lobe of liver ?  ?Alcohol abuse ?-Patient has been provided resources by [facility] for facilities to help with his alcohol abuse. ?- 3/15 counseled at length concerning his need to absolutely stop use of alcohol given his history to include Hemochromatosis ?-3/81 WEXH<37 ?-Folic acid 1 mg daily ?- Thiamine 100 mg daily ?  ?Hyponatremia ?-3/15 resolved ? ?Hypokalemia ?-3/15 normal saline + KCl 20 mEq/L at 125 ml/hr  ?  ?Hypertension ?-Monitor blood pressure.  Hold lisinopril. ?  ?Depression ?-Resume home medications in a.m. once medications have been verified.  Outpatient follow-up with psychiatry. ?  ?B12 deficiency ?-Currently on B12 injections as an outpatient. ? ?Hemochromatosis ?- Currently patient's H/H within normal limits unsure of last blood draw. ?  ? ? ?  ? ?DVT prophylaxis:  ?Code Status:  ?Family Communication:  ?Status is: Inpatient ? ? ? ?Dispo: The patient is from:  ?             Anticipated d/c is to:  ?             Anticipated d/c date is:  ?             Patient currently  ? ? ? ? ? ?Consultants:  ? ? ?Procedures/Significant Events:  ? ? ?I have personally reviewed and interpreted all radiology studies and my findings are as above. ? ?VENTILATOR SETTINGS: ? ? ? ?Cultures ?3/14 GI by PCR pending ? ? ?Antimicrobials: ? ? ? ?Devices ?  ? ?  LINES / TUBES:  ? ? ? ? ?Continuous Infusions: ? 0.9 % NaCl with KCl 20 mEq / L 125 mL/hr at 03/04/22 1254  ? ? ? ?Objective: ?Vitals:  ? 03/04/22 0102 03/04/22 0105 03/04/22 0457 03/04/22 1214  ?BP: 97/67 115/77 110/74 102/73  ?Pulse: 72 93 89 78  ?Resp: '18 20 20 20  '$ ?Temp: 98.6 ?F (37 ?C) 98.7 ?F (37.1 ?C) 98.3 ?F (36.8 ?C) 98.2 ?F (36.8 ?C)  ?TempSrc: Oral  Oral Oral  ?SpO2: (!) 72% 100% 100% 100%  ?Weight:   86 kg   ?Height:      ? ? ?Intake/Output Summary (Last 24 hours) at 03/04/2022 2029 ?Last data filed at  03/04/2022 1834 ?Gross per 24 hour  ?Intake 3917.71 ml  ?Output 530 ml  ?Net 3387.71 ml  ? ?Filed Weights  ? 03/03/22 1423 03/04/22 0457  ?Weight: 81.6 kg 86 kg  ? ? ?Examination: ? ?General: A/O x4, No acute respiratory distress ?Eyes: negative scleral hemorrhage, negative anisocoria, negative icterus ?ENT: Negative Runny nose, negative gingival bleeding, ?Neck:  Negative scars, masses, torticollis, lymphadenopathy, JVD ?Lungs: Clear to auscultation bilaterally without wheezes or crackles ?Cardiovascular: Regular rate and rhythm without murmur gallop or rub normal S1 and S2 ?Abdomen: negative abdominal pain, nondistended, positive soft, bowel sounds, no rebound, no ascites, no appreciable mass ?Extremities: No significant cyanosis, clubbing, or edema bilateral lower extremities ?Skin: Negative rashes, lesions, ulcers ?Psychiatric:  Negative depression, negative anxiety, negative fatigue, negative mania  ?Central nervous system:  Cranial nerves II through XII intact, tongue/uvula midline, all extremities muscle strength 5/5, sensation intact throughout, negative dysarthria, negative expressive aphasia, negative receptive aphasia. ? ?.  ? ? ? ?Data Reviewed: Care during the described time interval was provided by me .  I have reviewed this patient's available data, including medical history, events of note, physical examination, and all test results as part of my evaluation. ? ?CBC: ?Recent Labs  ?Lab 03/03/22 ?1436 03/04/22 ?0355  ?WBC 10.7* 5.2  ?HGB 15.3 13.0  ?HCT 43.5 39.0  ?MCV 103.6* 106.3*  ?PLT 127* PLATELET CLUMPS NOTED ON SMEAR, UNABLE TO ESTIMATE  ? ?Basic Metabolic Panel: ?Recent Labs  ?Lab 03/03/22 ?1436 03/04/22 ?0355  ?NA 132* 139  ?K 3.4* 3.6  ?CL 92* 104  ?CO2 20* 22  ?GLUCOSE 111* 80  ?BUN 44* 41*  ?CREATININE 3.96* 1.86*  ?CALCIUM 10.8* 9.9  ?MG  --  1.7  ? ?GFR: ?Estimated Creatinine Clearance: 42.4 mL/min (A) (by C-G formula based on SCr of 1.86 mg/dL (H)). ?Liver Function Tests: ?Recent Labs   ?Lab 03/03/22 ?1436 03/04/22 ?0355  ?AST 61* 41  ?ALT 56* 42  ?ALKPHOS 64 52  ?BILITOT 1.1 0.7  ?PROT 7.6 6.3*  ?ALBUMIN 4.6 3.9  ? ?Recent Labs  ?Lab 03/03/22 ?1436  ?LIPASE 48  ? ?No results for input(s): AMMONIA in the last 168 hours. ?Coagulation Profile: ?No results for input(s): INR, PROTIME in the last 168 hours. ?Cardiac Enzymes: ?No results for input(s): CKTOTAL, CKMB, CKMBINDEX, TROPONINI in the last 168 hours. ?BNP (last 3 results) ?No results for input(s): PROBNP in the last 8760 hours. ?HbA1C: ?No results for input(s): HGBA1C in the last 72 hours. ?CBG: ?No results for input(s): GLUCAP in the last 168 hours. ?Lipid Profile: ?No results for input(s): CHOL, HDL, LDLCALC, TRIG, CHOLHDL, LDLDIRECT in the last 72 hours. ?Thyroid Function Tests: ?No results for input(s): TSH, T4TOTAL, FREET4, T3FREE, THYROIDAB in the last 72 hours. ?Anemia Panel: ?No results for input(s): VITAMINB12, FOLATE, FERRITIN, TIBC, IRON,  RETICCTPCT in the last 72 hours. ?Sepsis Labs: ?No results for input(s): PROCALCITON, LATICACIDVEN in the last 168 hours. ? ?Recent Results (from the past 240 hour(s))  ?Resp Panel by RT-PCR (Flu A&B, Covid) Nasopharyngeal Swab     Status: None  ? Collection Time: 03/03/22  4:43 PM  ? Specimen: Nasopharyngeal Swab; Nasopharyngeal(NP) swabs in vial transport medium  ?Result Value Ref Range Status  ? SARS Coronavirus 2 by RT PCR NEGATIVE NEGATIVE Final  ?  Comment: (NOTE) ?SARS-CoV-2 target nucleic acids are NOT DETECTED. ? ?The SARS-CoV-2 RNA is generally detectable in upper respiratory ?specimens during the acute phase of infection. The lowest ?concentration of SARS-CoV-2 viral copies this assay can detect is ?138 copies/mL. A negative result does not preclude SARS-Cov-2 ?infection and should not be used as the sole basis for treatment or ?other patient management decisions. A negative result may occur with  ?improper specimen collection/handling, submission of specimen other ?than nasopharyngeal  swab, presence of viral mutation(s) within the ?areas targeted by this assay, and inadequate number of viral ?copies(<138 copies/mL). A negative result must be combined with ?clinical observations, patient histo

## 2022-03-04 NOTE — Progress Notes (Signed)
Pt c/o headache and some abdominal pain.  RN looked to see what he has, and it is only tylenol.  Passed information to primary nurse.  PT asked about something for sleep, was given melatonin, RN asked what he takes at home he said z-quill or tylenol PM and if that doesn't work he said he has an Rx that his primary doctor gave, but he does not recall the name.  RN informed him that we cant get him anything else for sleep this late in the night as we dont want him to sleep all day and get his days and nights all mixed up.  He was agreeable. Also requested a an icee, as he said the docotr told him he could eat if he wanted.   RN looked at pt orders and he is still NPO except for sips with meds ?

## 2022-03-05 LAB — CBC WITH DIFFERENTIAL/PLATELET
Abs Immature Granulocytes: 0.02 10*3/uL (ref 0.00–0.07)
Basophils Absolute: 0 10*3/uL (ref 0.0–0.1)
Basophils Relative: 0 %
Eosinophils Absolute: 0.2 10*3/uL (ref 0.0–0.5)
Eosinophils Relative: 4 %
HCT: 36.6 % — ABNORMAL LOW (ref 39.0–52.0)
Hemoglobin: 12.2 g/dL — ABNORMAL LOW (ref 13.0–17.0)
Immature Granulocytes: 1 %
Lymphocytes Relative: 27 %
Lymphs Abs: 1.2 10*3/uL (ref 0.7–4.0)
MCH: 35.2 pg — ABNORMAL HIGH (ref 26.0–34.0)
MCHC: 33.3 g/dL (ref 30.0–36.0)
MCV: 105.5 fL — ABNORMAL HIGH (ref 80.0–100.0)
Monocytes Absolute: 0.4 10*3/uL (ref 0.1–1.0)
Monocytes Relative: 8 %
Neutro Abs: 2.6 10*3/uL (ref 1.7–7.7)
Neutrophils Relative %: 60 %
Platelets: DECREASED 10*3/uL (ref 150–400)
RBC: 3.47 MIL/uL — ABNORMAL LOW (ref 4.22–5.81)
RDW: 13.5 % (ref 11.5–15.5)
WBC: 4.4 10*3/uL (ref 4.0–10.5)
nRBC: 0 % (ref 0.0–0.2)

## 2022-03-05 LAB — COMPREHENSIVE METABOLIC PANEL
ALT: 37 U/L (ref 0–44)
AST: 39 U/L (ref 15–41)
Albumin: 3.7 g/dL (ref 3.5–5.0)
Alkaline Phosphatase: 48 U/L (ref 38–126)
Anion gap: 5 (ref 5–15)
BUN: 26 mg/dL — ABNORMAL HIGH (ref 8–23)
CO2: 25 mmol/L (ref 22–32)
Calcium: 9.3 mg/dL (ref 8.9–10.3)
Chloride: 107 mmol/L (ref 98–111)
Creatinine, Ser: 1.04 mg/dL (ref 0.61–1.24)
GFR, Estimated: >60 mL/min (ref >60)
Glucose, Bld: 108 mg/dL — ABNORMAL HIGH (ref 70–99)
Potassium: 3.9 mmol/L (ref 3.5–5.1)
Sodium: 137 mmol/L (ref 135–145)
Total Bilirubin: 0.3 mg/dL (ref 0.3–1.2)
Total Protein: 6.2 g/dL — ABNORMAL LOW (ref 6.5–8.1)

## 2022-03-05 LAB — LIPASE, BLOOD: Lipase: 44 U/L (ref 11–51)

## 2022-03-05 LAB — PHOSPHORUS: Phosphorus: <1 mg/dL — CL (ref 2.5–4.6)

## 2022-03-05 LAB — MAGNESIUM: Magnesium: 1.3 mg/dL — ABNORMAL LOW (ref 1.7–2.4)

## 2022-03-05 MED ORDER — MAGNESIUM SULFATE 2 GM/50ML IV SOLN: 2.0000 g | Freq: Once | INTRAVENOUS | Status: DC

## 2022-03-05 NOTE — Progress Notes (Signed)
Patient refusing IV Fluids and Telemetry and keeps saying he will leave after he eats breakfast because he is not able to have some sleep and has to go for his dental appointment. All effort to make him understand the need for IV fluids proved futile. ?

## 2022-03-05 NOTE — Progress Notes (Signed)
? ? ? ?                                               Against Medical Advice ?Patient at this time expresses desire to leave the Hospital immidiately, patient has been warned that this is not Medically advisable at this time, and can result in Medical complications like Death and Disability, patient understands and accepts the risks involved and assumes full responsibilty of this decision. ? ?This patient has also been advised that if they feel the need for further medical assistance to return to any available ER or dial 9-1-1. ? ?Informed by Nursing staff that this patient has left care and has signed the form  Against Medical Advice on 03/05/2022 at 06:47 AM ? ?Gershon Cull BSN MSNA MSN ACNPC-AG ?Acute Care Nurse Practitioner ?[facility] ?[place] ? ? ? ?

## 2022-03-05 NOTE — Discharge Summary (Signed)
?  ?                                               Against Medical Advice ?Patient at this time expresses desire to leave the Hospital immidiately, patient has been warned that this is not Medically advisable at this time, and can result in Medical complications like Death and Disability, patient understands and accepts the risks involved and assumes full responsibilty of this decision. ?  ?This patient has also been advised that if they feel the need for further medical assistance to return to any available ER or dial 9-1-1. ?  ?Informed by Nursing staff that this patient has left care and has signed the form  Against Medical Advice on 03/05/2022 at 06:47 AM ? ?Time spent:0 min ?  ?  ?  ?Philip Humphrey, Geraldo Docker, MD ?[facility] ?

## 2022-03-05 NOTE — Progress Notes (Signed)
Patient verbalized the desire to leave AMA. He was educated on the need not to but he left at 0647 AM. On Call notified. ?

## 2022-03-06 ENCOUNTER — Telehealth: Payer: Self-pay | Admitting: *Deleted

## 2022-03-06 NOTE — Telephone Encounter (Signed)
This RN spoke with pt per his call stating concern for recent inpt stay and being told his iron is too high and need for a phlebotomy. ? ?This RN reviewed inpt status and noted pt left AMA. ? ?Philip Humphrey states " after being there for 5 days and not sleeping - I couldn't stay there any longer" ? ?He stated he had multiple issues which he didn't feel or understand - " they stopped my synthroid and didn't tell me why " ?" I had pink eye and my eyes were crusting over " ?" They locked me in a room and told me I could be contagious " ? ?Presently he states he is doing well " I'm shopping at [facility] and walking all around of course I'm using my cane" ? ?He states he is scheduled to have dental work done Monday " got 2 cracked teeth and need 3 root canals ". ?He is scheduled to see his primary MD on Tuesday 03/10/2022. ? ?This RN validated his concerns but also discussed with him symptoms he had for admission do not medically seem related to his hemochromatosis - or B 12 deficiency. This RN asked if was still giving himself B 12 inj with pt stating he has " forgotten and then I just take some 12 pills" ?This RN reviewed concern that he may not be able to absorb oral iron with Philip Humphrey stating " I don't absorb the injections" ? ?This RN informed him his ferritin level will be reviewed with MD for further recommendations regarding his hx of hemochromatosis ?

## 2022-03-13 ENCOUNTER — Telehealth: Payer: Self-pay | Admitting: Hematology and Oncology

## 2022-03-13 NOTE — Telephone Encounter (Signed)
.  Called pt per 3/17inb , Patient was unavailable, a message with appt time and date was left with number on file.   ?

## 2022-03-18 ENCOUNTER — Inpatient Hospital Stay: Payer: 59 | Attending: Hematology and Oncology

## 2022-03-27 ENCOUNTER — Inpatient Hospital Stay: Payer: 59 | Attending: Hematology and Oncology

## 2022-03-27 ENCOUNTER — Other Ambulatory Visit: Payer: Self-pay

## 2022-03-27 DIAGNOSIS — E538 Deficiency of other specified B group vitamins: Secondary | ICD-10-CM | POA: Diagnosis present

## 2022-03-27 NOTE — Progress Notes (Signed)
Philip Humphrey presents today for phlebotomy per MD orders. ?Phlebotomy procedure started at 0936 and ended at [place]. ?513 grams removed. ?Patient observed for 30 minutes after procedure without any incident. ?Patient tolerated procedure well. ?IV needle removed intact. ?Patient declined snack and beverage. ? ?

## 2022-03-27 NOTE — Patient Instructions (Signed)

## 2022-04-06 DIAGNOSIS — R112 Nausea with vomiting, unspecified: Secondary | ICD-10-CM | POA: Insufficient documentation

## 2022-04-06 DIAGNOSIS — Z5321 Procedure and treatment not carried out due to patient leaving prior to being seen by health care provider: Secondary | ICD-10-CM | POA: Insufficient documentation

## 2022-04-06 DIAGNOSIS — Z20822 Contact with and (suspected) exposure to covid-19: Secondary | ICD-10-CM | POA: Diagnosis not present

## 2022-04-07 ENCOUNTER — Encounter (HOSPITAL_COMMUNITY): Payer: Self-pay

## 2022-04-07 ENCOUNTER — Emergency Department (HOSPITAL_COMMUNITY)
Admission: EM | Admit: 2022-04-07 | Discharge: 2022-04-07 | Discharge status: Against Medical Advice | Payer: 59 | Attending: Emergency Medicine | Admitting: Emergency Medicine

## 2022-04-07 ENCOUNTER — Other Ambulatory Visit: Payer: Self-pay

## 2022-04-07 LAB — URINALYSIS, ROUTINE W REFLEX MICROSCOPIC
Bacteria, UA: NONE SEEN
Bilirubin Urine: NEGATIVE
Glucose, UA: NEGATIVE mg/dL
Ketones, ur: 80 mg/dL — AB
Leukocytes,Ua: NEGATIVE
Nitrite: NEGATIVE
Protein, ur: NEGATIVE mg/dL
Specific Gravity, Urine: 1.016 (ref 1.005–1.030)
pH: 5 (ref 5.0–8.0)

## 2022-04-07 LAB — CBC WITH DIFFERENTIAL/PLATELET
Abs Immature Granulocytes: 0.02 10*3/uL (ref 0.00–0.07)
Basophils Absolute: 0 10*3/uL (ref 0.0–0.1)
Basophils Relative: 1 %
Eosinophils Absolute: 0.3 10*3/uL (ref 0.0–0.5)
Eosinophils Relative: 4 %
HCT: 37.3 % — ABNORMAL LOW (ref 39.0–52.0)
Hemoglobin: 13.5 g/dL (ref 13.0–17.0)
Immature Granulocytes: 0 %
Lymphocytes Relative: 23 %
Lymphs Abs: 1.7 10*3/uL (ref 0.7–4.0)
MCH: 37.3 pg — ABNORMAL HIGH (ref 26.0–34.0)
MCHC: 36.2 g/dL — ABNORMAL HIGH (ref 30.0–36.0)
MCV: 103 fL — ABNORMAL HIGH (ref 80.0–100.0)
Monocytes Absolute: 0.5 10*3/uL (ref 0.1–1.0)
Monocytes Relative: 6 %
Neutro Abs: 4.9 10*3/uL (ref 1.7–7.7)
Neutrophils Relative %: 66 %
Platelets: 155 10*3/uL (ref 150–400)
RBC: 3.62 MIL/uL — ABNORMAL LOW (ref 4.22–5.81)
RDW: 14.4 % (ref 11.5–15.5)
WBC: 7.3 10*3/uL (ref 4.0–10.5)
nRBC: 0 % (ref 0.0–0.2)

## 2022-04-07 LAB — COMPREHENSIVE METABOLIC PANEL
ALT: 39 U/L (ref 0–44)
AST: 45 U/L — ABNORMAL HIGH (ref 15–41)
Albumin: 4.7 g/dL (ref 3.5–5.0)
Alkaline Phosphatase: 55 U/L (ref 38–126)
Anion gap: 17 — ABNORMAL HIGH (ref 5–15)
BUN: 50 mg/dL — ABNORMAL HIGH (ref 8–23)
CO2: 23 mmol/L (ref 22–32)
Calcium: 11.2 mg/dL — ABNORMAL HIGH (ref 8.9–10.3)
Chloride: 98 mmol/L (ref 98–111)
Creatinine, Ser: 1.25 mg/dL — ABNORMAL HIGH (ref 0.61–1.24)
GFR, Estimated: >60 mL/min (ref >60)
Glucose, Bld: 85 mg/dL (ref 70–99)
Potassium: 3.9 mmol/L (ref 3.5–5.1)
Sodium: 138 mmol/L (ref 135–145)
Total Bilirubin: 1.3 mg/dL — ABNORMAL HIGH (ref 0.3–1.2)
Total Protein: 8 g/dL (ref 6.5–8.1)

## 2022-04-07 LAB — RESP PANEL BY RT-PCR (FLU A&B, COVID) ARPGX2
Influenza A by PCR: NEGATIVE
Influenza B by PCR: NEGATIVE
SARS Coronavirus 2 by RT PCR: NEGATIVE

## 2022-04-07 NOTE — ED Triage Notes (Signed)
Patient BIB [facility] from home. Nausea and vomiting for months. Last seen here a week ago. Left AMA. Daughter said it is due to alcohol use. No vomiting with EMS.  ?

## 2022-04-07 NOTE — ED Notes (Signed)
Attempted to call legal guardian, no answer. ?

## 2022-04-12 ENCOUNTER — Emergency Department (HOSPITAL_COMMUNITY): Payer: 59

## 2022-04-12 ENCOUNTER — Other Ambulatory Visit: Payer: Self-pay

## 2022-04-12 ENCOUNTER — Inpatient Hospital Stay (HOSPITAL_COMMUNITY)
Admission: EM | Admit: 2022-04-12 | Discharge: 2022-04-14 | DRG: 641 | Disposition: A | Discharge status: Home Health | Payer: 59 | Attending: Internal Medicine | Admitting: Internal Medicine

## 2022-04-12 DIAGNOSIS — E039 Hypothyroidism, unspecified: Secondary | ICD-10-CM | POA: Diagnosis present

## 2022-04-12 DIAGNOSIS — E860 Dehydration: Secondary | ICD-10-CM | POA: Diagnosis not present

## 2022-04-12 DIAGNOSIS — F10129 Alcohol abuse with intoxication, unspecified: Secondary | ICD-10-CM | POA: Diagnosis present

## 2022-04-12 DIAGNOSIS — Y92009 Unspecified place in unspecified non-institutional (private) residence as the place of occurrence of the external cause: Secondary | ICD-10-CM

## 2022-04-12 DIAGNOSIS — Z79890 Hormone replacement therapy (postmenopausal): Secondary | ICD-10-CM

## 2022-04-12 DIAGNOSIS — Z881 Allergy status to other antibiotic agents status: Secondary | ICD-10-CM

## 2022-04-12 DIAGNOSIS — Z96643 Presence of artificial hip joint, bilateral: Secondary | ICD-10-CM | POA: Diagnosis present

## 2022-04-12 DIAGNOSIS — W1830XA Fall on same level, unspecified, initial encounter: Secondary | ICD-10-CM | POA: Diagnosis present

## 2022-04-12 DIAGNOSIS — Z8249 Family history of ischemic heart disease and other diseases of the circulatory system: Secondary | ICD-10-CM

## 2022-04-12 DIAGNOSIS — Z20822 Contact with and (suspected) exposure to covid-19: Secondary | ICD-10-CM | POA: Diagnosis present

## 2022-04-12 DIAGNOSIS — Z79899 Other long term (current) drug therapy: Secondary | ICD-10-CM

## 2022-04-12 DIAGNOSIS — E785 Hyperlipidemia, unspecified: Secondary | ICD-10-CM | POA: Diagnosis present

## 2022-04-12 DIAGNOSIS — Z7141 Alcohol abuse counseling and surveillance of alcoholic: Secondary | ICD-10-CM

## 2022-04-12 DIAGNOSIS — W19XXXA Unspecified fall, initial encounter: Secondary | ICD-10-CM | POA: Diagnosis not present

## 2022-04-12 DIAGNOSIS — Z823 Family history of stroke: Secondary | ICD-10-CM

## 2022-04-12 DIAGNOSIS — R55 Syncope and collapse: Secondary | ICD-10-CM | POA: Diagnosis not present

## 2022-04-12 DIAGNOSIS — F1010 Alcohol abuse, uncomplicated: Secondary | ICD-10-CM | POA: Diagnosis present

## 2022-04-12 DIAGNOSIS — K219 Gastro-esophageal reflux disease without esophagitis: Secondary | ICD-10-CM | POA: Diagnosis present

## 2022-04-12 DIAGNOSIS — Z885 Allergy status to narcotic agent status: Secondary | ICD-10-CM

## 2022-04-12 DIAGNOSIS — D696 Thrombocytopenia, unspecified: Secondary | ICD-10-CM | POA: Diagnosis present

## 2022-04-12 DIAGNOSIS — E872 Acidosis, unspecified: Secondary | ICD-10-CM | POA: Diagnosis not present

## 2022-04-12 DIAGNOSIS — Z801 Family history of malignant neoplasm of trachea, bronchus and lung: Secondary | ICD-10-CM

## 2022-04-12 DIAGNOSIS — E861 Hypovolemia: Secondary | ICD-10-CM | POA: Diagnosis present

## 2022-04-12 DIAGNOSIS — Z87891 Personal history of nicotine dependence: Secondary | ICD-10-CM

## 2022-04-12 DIAGNOSIS — Y906 Blood alcohol level of 120-199 mg/100 ml: Secondary | ICD-10-CM | POA: Diagnosis present

## 2022-04-12 DIAGNOSIS — R7401 Elevation of levels of liver transaminase levels: Secondary | ICD-10-CM | POA: Diagnosis present

## 2022-04-12 DIAGNOSIS — N179 Acute kidney failure, unspecified: Secondary | ICD-10-CM | POA: Diagnosis present

## 2022-04-12 DIAGNOSIS — I10 Essential (primary) hypertension: Secondary | ICD-10-CM | POA: Diagnosis present

## 2022-04-12 LAB — CBC WITH DIFFERENTIAL/PLATELET
Abs Immature Granulocytes: 0.03 10*3/uL (ref 0.00–0.07)
Basophils Absolute: 0 10*3/uL (ref 0.0–0.1)
Basophils Relative: 1 %
Eosinophils Absolute: 0.2 10*3/uL (ref 0.0–0.5)
Eosinophils Relative: 4 %
HCT: 31.4 % — ABNORMAL LOW (ref 39.0–52.0)
Hemoglobin: 10.6 g/dL — ABNORMAL LOW (ref 13.0–17.0)
Immature Granulocytes: 1 %
Lymphocytes Relative: 19 %
Lymphs Abs: 1.1 10*3/uL (ref 0.7–4.0)
MCH: 36.3 pg — ABNORMAL HIGH (ref 26.0–34.0)
MCHC: 33.8 g/dL (ref 30.0–36.0)
MCV: 107.5 fL — ABNORMAL HIGH (ref 80.0–100.0)
Monocytes Absolute: 0.7 10*3/uL (ref 0.1–1.0)
Monocytes Relative: 12 %
Neutro Abs: 3.7 10*3/uL (ref 1.7–7.7)
Neutrophils Relative %: 63 %
Platelets: 93 10*3/uL — ABNORMAL LOW (ref 150–400)
RBC: 2.92 MIL/uL — ABNORMAL LOW (ref 4.22–5.81)
RDW: 14.3 % (ref 11.5–15.5)
WBC: 5.8 10*3/uL (ref 4.0–10.5)
nRBC: 0 % (ref 0.0–0.2)

## 2022-04-12 LAB — I-STAT CHEM 8, ED
BUN: 19 mg/dL (ref 8–23)
Calcium, Ion: 1.08 mmol/L — ABNORMAL LOW (ref 1.15–1.40)
Chloride: 101 mmol/L (ref 98–111)
Creatinine, Ser: 1.6 mg/dL — ABNORMAL HIGH (ref 0.61–1.24)
Glucose, Bld: 76 mg/dL (ref 70–99)
HCT: 32 % — ABNORMAL LOW (ref 39.0–52.0)
Hemoglobin: 10.9 g/dL — ABNORMAL LOW (ref 13.0–17.0)
Potassium: 3.8 mmol/L (ref 3.5–5.1)
Sodium: 138 mmol/L (ref 135–145)
TCO2: 22 mmol/L (ref 22–32)

## 2022-04-12 LAB — ETHANOL: Alcohol, Ethyl (B): 145 mg/dL — ABNORMAL HIGH (ref <10)

## 2022-04-12 LAB — RESP PANEL BY RT-PCR (FLU A&B, COVID) ARPGX2
Influenza A by PCR: NEGATIVE
Influenza B by PCR: NEGATIVE
SARS Coronavirus 2 by RT PCR: NEGATIVE

## 2022-04-12 LAB — COMPREHENSIVE METABOLIC PANEL
ALT: 64 U/L — ABNORMAL HIGH (ref 0–44)
AST: 110 U/L — ABNORMAL HIGH (ref 15–41)
Albumin: 3.8 g/dL (ref 3.5–5.0)
Alkaline Phosphatase: 35 U/L — ABNORMAL LOW (ref 38–126)
Anion gap: 17 — ABNORMAL HIGH (ref 5–15)
BUN: 19 mg/dL (ref 8–23)
CO2: 18 mmol/L — ABNORMAL LOW (ref 22–32)
Calcium: 8.6 mg/dL — ABNORMAL LOW (ref 8.9–10.3)
Chloride: 101 mmol/L (ref 98–111)
Creatinine, Ser: 1.55 mg/dL — ABNORMAL HIGH (ref 0.61–1.24)
GFR, Estimated: 51 mL/min — ABNORMAL LOW (ref >60)
Glucose, Bld: 80 mg/dL (ref 70–99)
Potassium: 3.6 mmol/L (ref 3.5–5.1)
Sodium: 136 mmol/L (ref 135–145)
Total Bilirubin: 1.1 mg/dL (ref 0.3–1.2)
Total Protein: 5.9 g/dL — ABNORMAL LOW (ref 6.5–8.1)

## 2022-04-12 LAB — LACTIC ACID, PLASMA
Lactic Acid, Venous: 3.3 mmol/L (ref 0.5–1.9)
Lactic Acid, Venous: 2.6 mmol/L (ref 0.5–1.9)

## 2022-04-12 LAB — TROPONIN I (HIGH SENSITIVITY)
Troponin I (High Sensitivity): 5 ng/L (ref <18)
Troponin I (High Sensitivity): 6 ng/L (ref <18)

## 2022-04-12 LAB — MAGNESIUM: Magnesium: 1.3 mg/dL — ABNORMAL LOW (ref 1.7–2.4)

## 2022-04-12 LAB — AMMONIA: Ammonia: 19 umol/L (ref 9–35)

## 2022-04-12 MED ORDER — LORAZEPAM 2 MG/ML IJ SOLN: 1.0000 mg | INTRAMUSCULAR | Status: DC | PRN

## 2022-04-12 MED ORDER — FOLIC ACID 1 MG PO TABS
1.0000 mg | ORAL_TABLET | Freq: Every day | ORAL | Status: DC
  Administered 2022-04-13 – 2022-04-14 (×2): 1 mg via ORAL
  Filled 2022-04-12 (×2): qty 1

## 2022-04-12 MED ORDER — FAMOTIDINE 20 MG PO TABS: 40.0000 mg | ORAL_TABLET | Freq: Every day | ORAL | Status: DC

## 2022-04-12 MED ORDER — POLYETHYLENE GLYCOL 3350 17 G PO PACK
17.0000 g | PACK | Freq: Every day | ORAL | Status: DC | PRN
Start: 2022-04-12 — End: 2022-04-14

## 2022-04-12 MED ORDER — LACTATED RINGERS IV BOLUS
1000.0000 mL | Freq: Once | INTRAVENOUS | Status: AC
  Administered 2022-04-12: 1000 mL via INTRAVENOUS

## 2022-04-12 MED ORDER — ADULT MULTIVITAMIN W/MINERALS CH
1.0000 | ORAL_TABLET | Freq: Every day | ORAL | Status: DC
  Administered 2022-04-13 – 2022-04-14 (×2): 1 via ORAL
  Filled 2022-04-12 (×2): qty 1

## 2022-04-12 MED ORDER — LACTATED RINGERS IV SOLN: INTRAVENOUS | Status: DC

## 2022-04-12 MED ORDER — CYANOCOBALAMIN 1000 MCG/ML IJ SOLN: 1000.0000 ug | INTRAMUSCULAR | Status: DC

## 2022-04-12 MED ORDER — DOXEPIN HCL 10 MG PO CAPS: 10.0000 mg | ORAL_CAPSULE | Freq: Every day | ORAL | Status: DC

## 2022-04-12 MED ORDER — ALBUTEROL SULFATE (2.5 MG/3ML) 0.083% IN NEBU: 3.0000 mL | INHALATION_SOLUTION | Freq: Four times a day (QID) | RESPIRATORY_TRACT | Status: DC | PRN

## 2022-04-12 MED ORDER — LEVOTHYROXINE SODIUM 75 MCG PO TABS
175.0000 ug | ORAL_TABLET | Freq: Every day | ORAL | Status: DC
  Administered 2022-04-13 – 2022-04-14 (×2): 175 ug via ORAL
  Filled 2022-04-12 (×2): qty 1

## 2022-04-12 MED ORDER — ENOXAPARIN SODIUM 40 MG/0.4ML IJ SOSY
40.0000 mg | PREFILLED_SYRINGE | INTRAMUSCULAR | Status: DC
  Administered 2022-04-13 – 2022-04-14 (×2): 40 mg via SUBCUTANEOUS
  Filled 2022-04-12 (×2): qty 0.4

## 2022-04-12 MED ORDER — LORAZEPAM 1 MG PO TABS
1.0000 mg | ORAL_TABLET | ORAL | Status: DC | PRN
  Administered 2022-04-13: 1 mg via ORAL
  Filled 2022-04-12: qty 1

## 2022-04-12 MED ORDER — THIAMINE HCL 100 MG/ML IJ SOLN: 100.0000 mg | Freq: Every day | INTRAMUSCULAR | Status: DC

## 2022-04-12 MED ORDER — LACTATED RINGERS IV BOLUS
1000.0000 mL | INTRAVENOUS | Status: AC
  Administered 2022-04-12: 1000 mL via INTRAVENOUS

## 2022-04-12 MED ORDER — ACETAMINOPHEN 325 MG PO TABS
650.0000 mg | ORAL_TABLET | Freq: Four times a day (QID) | ORAL | Status: DC | PRN
  Administered 2022-04-13 – 2022-04-14 (×2): 650 mg via ORAL
  Filled 2022-04-12 (×2): qty 2

## 2022-04-12 MED ORDER — THIAMINE HCL 100 MG PO TABS
100.0000 mg | ORAL_TABLET | Freq: Every day | ORAL | Status: DC
  Administered 2022-04-13 – 2022-04-14 (×2): 100 mg via ORAL
  Filled 2022-04-12 (×2): qty 1

## 2022-04-12 MED ORDER — MAGNESIUM SULFATE 2 GM/50ML IV SOLN
2.0000 g | Freq: Once | INTRAVENOUS | Status: AC
  Administered 2022-04-12: 2 g via INTRAVENOUS
  Filled 2022-04-12: qty 50

## 2022-04-12 MED ORDER — MELATONIN 3 MG PO TABS
3.0000 mg | ORAL_TABLET | Freq: Every evening | ORAL | Status: DC | PRN
  Administered 2022-04-13 (×2): 3 mg via ORAL
  Filled 2022-04-12 (×2): qty 1

## 2022-04-12 MED ORDER — ATORVASTATIN CALCIUM 80 MG PO TABS
80.0000 mg | ORAL_TABLET | Freq: Every day | ORAL | Status: DC
  Administered 2022-04-13 – 2022-04-14 (×2): 80 mg via ORAL
  Filled 2022-04-12 (×2): qty 1

## 2022-04-12 MED ORDER — ONDANSETRON HCL 4 MG/2ML IJ SOLN: 4.0000 mg | Freq: Four times a day (QID) | INTRAMUSCULAR | Status: DC | PRN

## 2022-04-12 MED ORDER — MONTELUKAST SODIUM 10 MG PO TABS: 10.0000 mg | ORAL_TABLET | Freq: Every day | ORAL | Status: DC

## 2022-04-12 MED ORDER — MAGNESIUM SULFATE 2 GM/50ML IV SOLN
2.0000 g | Freq: Once | INTRAVENOUS | Status: AC
Start: 2022-04-12 — End: 2022-04-12
  Administered 2022-04-12: 2 g via INTRAVENOUS
  Filled 2022-04-12: qty 50

## 2022-04-12 NOTE — ED Triage Notes (Signed)
Pt bib [facility] from home after possible syncopal episode. Pt states he may have passed out when walking and fell to the ground. + head trauma. Initial pressures 76/50 - 900 ml NS given - last pressure 105/58. Per family - hx of etoh abuse - last drink several days ago.  ? ?HR 100, CBG 87 ?

## 2022-04-12 NOTE — H&P (Signed)
?History and Physical ? ?Philip Humphrey LAG:[id number] DOB: 1961/01/11 DOA: 04/12/2022 ? ?Referring physician: Dr. Regenia Skeeter, [place]  ?PCP: Simona Huh, NP  ?Outpatient Specialists: Hematology/oncology, Dr. Chryl Heck. ?Patient coming from: Home. ? ?Chief Complaint: Unwitnessed fall ? ?HPI: Philip Humphrey is a 61 y.o. male with medical history significant for hemochromatosis, last phlebotomy in August 2022, B12 deficiency, essential hypertension, hypothyroidism, alcohol abuse, history of medical noncompliance with leaving [place], who presented to [facility] ED after an unwitnessed fall at home.  Unclear if the patient lost consciousness.  The patient states he doesn't think so.  His legs have been weak.  His daughter who was in the house about 6 steps away, found him on the floor.  He responded immediately when she called his name.  He was a little groggy.  No seizure like activity or movement.  No tongue bite.  No bowel or urinary incontinence.  Endorses loose stools yesterday and 2 days prior but none today.  Has had poor oral intake.  Since the death of his wife 2 years ago, and now that he is retired, when he is bored he drinks alcohol, whisky.  Recently presented to the ED on 04/06/2022 due to intractable nausea and vomiting he left without being seen by provider.   ? ?EMS was activated.  In the ED, feels generalized weakness and is hypovolemic on exam.  Patient denies alcohol use in the last 3 to 4 days, however his serum alcohol level is 145 in the ED.  Lab studies remarkable for elevated creatinine 1.60 from baseline of 1.2.  Serum magnesium 1.3, elevated transaminases with normal T. bili.  Hemoglobin 10.9, MCV 107.5, platelet count 93.  TR[facility], hospitalist service, was asked to admit. ? ?ED Course: Tmax 97.9.  BP of 80/44 with MAP of 56, pulse 95, respiratory 20, saturation 96% on room air.  Notable lab studies as stated in HPI. ? ?Review of Systems: ?Review of systems as noted in the HPI. All other  systems reviewed and are negative. ? ? ?Past Medical History:  ?Diagnosis Date  ? Allergy   ? occ. seasonal  ? Anxiety   ? Arthritis   ? Colon polyp   ? Condyloma   ? anal  ? Depression   ? GERD (gastroesophageal reflux disease)   ? History of transfusion   ? as child  ? Hypertension   ? Knee injury   ? right knee cap w piece broken off- MRI done 02-06-15-pending surgery. 11-18-16 right knee is still painful, both shoulders(limited ROM right).  ? Knee pain   ? 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 Palpitations   ? normal stress test 10 yrs ago  ? Pneumonia   ? Thyroid disease   ? Thyroid removed unintentionally at age 20- no further thyroid tissue remains-uses daily supplement  ? ?Past Surgical History:  ?Procedure Laterality Date  ? COLONOSCOPY    ? COLONOSCOPY N/A 02/15/2015  ? Procedure: COLONOSCOPY;  Surgeon: Alphonsa Overall, MD;  Location: [facility];  Service: General;  Laterality: N/A;  ? cyst removed    ? from neck / abdomed  ? EXAMINATION UNDER ANESTHESIA N/A 02/15/2015  ? Procedure: EXAM UNDER ANESTHESIA;  Surgeon: Pedro Earls, MD;  Location: [facility];  Service: General;  Laterality: N/A;  ? HERNIA REPAIR Left   ? 10 yrs ago  ? KNEE ARTHROSCOPY Left   ? x1 and meniscal tear   ? ROTATOR CUFF REPAIR Right   ? x1  ?  SHOULDER ARTHROSCOPY W/ ROTATOR CUFF REPAIR Left   ? THYROIDECTOMY  age 34   ? TOTAL HIP ARTHROPLASTY Left 11/24/2016  ? Procedure: LEFT TOTAL HIP ARTHROPLASTY ANTERIOR APPROACH;  Surgeon: Paralee Cancel, MD;  Location: [facility];  Service: Orthopedics;  Laterality: Left;  ? TOTAL HIP ARTHROPLASTY    ? Right hip  Dr. Alvan Dame 08/03/17  ? TOTAL HIP ARTHROPLASTY Right 08/03/2017  ? Procedure: RIGHT TOTAL HIP ARTHROPLASTY ANTERIOR APPROACH;  Surgeon: Paralee Cancel, MD;  Location: [facility];  Service: Orthopedics;  Laterality: Right;  70 mins  ? UMBILICAL HERNIA REPAIR N/A 10/18/2017  ? Procedure: Umbilical hernia repair  and excision of lipomas;  Surgeon: Johnathan Hausen, MD;  Location: [facility];  Service: General;   Laterality: N/A;  ? WART FULGURATION N/A 02/15/2015  ? Procedure: REMOVAL OF ANAL TAGS,CONDYLOMA;  Surgeon: Pedro Earls, MD;  Location: [facility];  Service: General;  Laterality: N/A;  ? WRIST SURGERY    ? x2- left (repair tendon/ ligament)  ? ? ?Social History:  reports that he has quit smoking. His smoking use included cigars. He has never used smokeless tobacco. He reports current alcohol use. He reports that he does not use drugs. ? ? ?Allergies  ?Allergen Reactions  ? Sulfa Antibiotics Anaphylaxis and Swelling  ?  Mouth and throat swelling  ? Oxycodone Itching  ? ? ?Family History  ?Problem Relation Age of Onset  ? Heart disease Mother   ? Cancer Brother   ?     Lung  ? Lung disease Brother   ? Stroke Father   ?  ? ? ?Prior to Admission medications   ?Medication Sig Start Date End Date Taking? Authorizing Provider  ?acamprosate (CAMPRAL) 333 MG tablet Take 2 tablets (666 mg total) by mouth 3 (three) times daily with meals. ?Patient not taking: Reported on 03/03/2022 07/09/21   Mercy Riding, MD  ?albuterol (VENTOLIN HFA) 108 (90 Base) MCG/ACT inhaler Inhale 2 puffs into the lungs every 6 (six) hours as needed for wheezing or shortness of breath.    [provider]  ?amoxicillin (AMOXIL) 500 MG capsule Take 2,000 mg by mouth See admin instructions. Take 4 capsules (2000 mg) by mouth one hour prior to dental appointments ?Patient not taking: Reported on 03/03/2022    [provider]  ?aspirin EC 81 MG tablet Take 1 tablet (81 mg total) by mouth daily. Swallow whole. ?Patient not taking: Reported on 03/03/2022 07/09/21 07/09/22  Mercy Riding, MD  ?atorvastatin (LIPITOR) 80 MG tablet Take 80 mg by mouth daily. 10/09/16   [provider]  ?bismuth subsalicylate (PEPTO BISMOL) 262 MG/15ML suspension Take 30 mLs by mouth every 6 (six) hours as needed for indigestion (stomach pain).    [provider]  ?calcium carbonate (TUMS - DOSED IN MG ELEMENTAL CALCIUM) 500 MG chewable tablet Chew  2 tablets by mouth at bedtime as needed for indigestion or heartburn.    [provider]  ?cyanocobalamin (,VITAMIN B-12,) 1000 MCG/ML injection Inject 1,000 mcg into the muscle once a week. 01/10/22   [provider]  ?desloratadine (CLARINEX) 5 MG tablet Take 10 mg by mouth daily. 12/25/20   [provider]  ?diclofenac Sodium (VOLTAREN) 1 % GEL Apply 1 application topically 2 (two) times daily as needed (pain).    [provider]  ?docusate sodium (COLACE) 100 MG capsule Take 1 capsule (100 mg total) by mouth 2 (two) times daily. ?Patient not taking: Reported on 10/12/2017 11/25/16   Babish,  Rodman Key, PA-C  ?doxepin (SINEQUAN) 10 MG capsule Take 10 mg by mouth at bedtime. 06/17/21   [provider]  ?famotidine (PEPCID) 40 MG tablet Take 40 mg by mouth daily. 01/25/21   [provider]  ?folic acid (FOLVITE) 1 MG tablet TAKE 1 TABLET BY MOUTH EVERY DAY ?Patient taking differently: Take 1 mg by mouth daily. 04/27/21   Benay Pike, MD  ?gabapentin (NEURONTIN) 300 MG capsule Take 300-600 mg by mouth See admin instructions. Take 1 capsule by mouth (300 mg) in the morning & 2 capsules by mouth  (600 mg) at night.    [provider]  ?hydrochlorothiazide (HYDRODIURIL) 50 MG tablet Take 50 mg by mouth daily. 02/18/22   [provider]  ?hydrOXYzine (ATARAX/VISTARIL) 25 MG tablet Take 25 mg by mouth every 4 (four) hours as needed. For itching 06/17/21   [provider]  ?lisinopril (ZESTRIL) 20 MG tablet Take 1 tablet (20 mg total) by mouth daily. 07/16/21   Mercy Riding, MD  ?Magnesium 400 MG TABS Take 400 mg by mouth daily.    [provider]  ?meclizine (ANTIVERT) 12.5 MG tablet Take 12.5 mg by mouth 2 (two) times daily as needed for dizziness. 05/06/21   [provider]  ?montelukast (SINGULAIR) 10 MG tablet Take 10 mg by mouth daily. 05/28/21   [provider]  ?nitroGLYCERIN (NITROSTAT) 0.4 MG SL tablet Place 0.4 mg  under the tongue every 5 (five) minutes as needed for chest pain.    [provider]  ?Olopatadine HCl (PATADAY OP) Place 1 drop into both eyes 2 (two) times daily as needed (itching/allergies).    Provi

## 2022-04-12 NOTE — ED Provider Notes (Signed)
?[place] ?Provider Note ? ? ?CSN: [id number] ?Arrival date & time: 04/12/22  1938 ? ?  ? ?History ? ?Chief Complaint  ?Patient presents with  ? Fall  ? Loss of Consciousness  ? ? ?KEYAN FOLSON is a 61 y.o. male. ? ?HPI ?61 year old male presents from home with syncope/fall.  History is from the nurses spoke to EMS as well as from the patient.  Patient states that he got up out of bed and felt okay and then walked and all of a sudden fell.  He is not sure why and he hit his head.  He did lose consciousness and since waking up is had a headache but had no preceding headache.  Per family he has a history of alcohol abuse though the patient seems to think he only drinks sparingly.  However he does endorse that he stopped drinking 3 or 4 days ago.  He has been feeling generally weak during this time and had a little bit of diarrhea, a couple times a day.  No blood in his stool.  No chest pain or shortness of breath but does have generalized weakness and feels like he is dehydrated.  No tongue injury.  It does not sound like this fall was specifically witnessed by family though they are the ones that called EMS.  He was initially hypotensive in the 70s and was given IV fluids by EMS.  He denies any focal pain besides a headache though he states this is improving. ? ?Home Medications ?Prior to Admission medications   ?Medication Sig Start Date End Date Taking? Authorizing Provider  ?atorvastatin (LIPITOR) 80 MG tablet Take 80 mg by mouth daily. 10/09/16  Yes [provider]  ?bismuth subsalicylate (PEPTO BISMOL) 262 MG/15ML suspension Take 30 mLs by mouth every 6 (six) hours as needed for indigestion (stomach pain).   Yes [provider]  ?calcium carbonate (TUMS - DOSED IN MG ELEMENTAL CALCIUM) 500 MG chewable tablet Chew 2 tablets by mouth at bedtime as needed for indigestion or heartburn.   Yes [provider]  ?diclofenac Sodium (VOLTAREN) 1 %  GEL Apply 1 application topically 2 (two) times daily as needed (pain).   Yes [provider]  ?diphenhydramine-acetaminophen (TYLENOL PM) 25-500 MG TABS tablet Take 1 tablet by mouth at bedtime as needed (sleep).   Yes [provider]  ?famotidine (PEPCID) 40 MG tablet Take 40 mg by mouth daily as needed for heartburn or indigestion. 01/25/21  Yes [provider]  ?gabapentin (NEURONTIN) 300 MG capsule Take 300-600 mg by mouth See admin instructions. Take 1 capsule by mouth (300 mg) in the morning & 2 capsules by mouth  (600 mg) at night.   Yes [provider]  ?hydrOXYzine (ATARAX/VISTARIL) 25 MG tablet Take 25 mg by mouth every 4 (four) hours as needed. For itching 06/17/21  Yes [provider]  ?lisinopril (ZESTRIL) 20 MG tablet Take 1 tablet (20 mg total) by mouth daily. 07/16/21  Yes Mercy Riding, MD  ?Magnesium 400 MG TABS Take 400 mg by mouth daily.   Yes [provider]  ?MELATONIN PO Take 1 tablet by mouth at bedtime.   Yes [provider]  ?Olopatadine HCl (PATADAY OP) Place 1 drop into both eyes 2 (two) times daily as needed (itching/allergies).   Yes [provider]  ?SYNTHROID 175 MCG tablet Take 1 tablet (175 mcg total) by mouth daily. NO MORE REFILLS WITHOUT OFFICE VISIT - 2ND NOTICE ?Patient  taking differently: Take 175 mcg by mouth daily. 11/14/14  Yes Leandrew Koyanagi, MD  ?traMADol (ULTRAM) 50 MG tablet Take 50 mg by mouth every 4 (four) hours as needed for moderate pain. 03/10/22  Yes [provider]  ?Vitamin D, Ergocalciferol, (DRISDOL) 1.25 MG (50000 UNIT) CAPS capsule Take 50,000 Units by mouth every 7 (seven) days. 05/14/21  Yes [provider]  ?acamprosate (CAMPRAL) 333 MG tablet Take 2 tablets (666 mg total) by mouth 3 (three) times daily with meals. ?Patient not taking: Reported on 03/03/2022 07/09/21   Mercy Riding, MD  ?albuterol (VENTOLIN HFA) 108 (90 Base) MCG/ACT inhaler Inhale 2 puffs into the  lungs every 6 (six) hours as needed for wheezing or shortness of breath. ?Patient not taking: Reported on 04/12/2022    [provider]  ?amoxicillin (AMOXIL) 500 MG capsule Take 2,000 mg by mouth See admin instructions. Take 4 capsules (2000 mg) by mouth one hour prior to dental appointments ?Patient not taking: Reported on 03/03/2022    [provider]  ?aspirin EC 81 MG tablet Take 1 tablet (81 mg total) by mouth daily. Swallow whole. ?Patient not taking: Reported on 03/03/2022 07/09/21 07/09/22  Mercy Riding, MD  ?cyanocobalamin (,VITAMIN B-12,) 1000 MCG/ML injection Inject 1,000 mcg into the muscle once a week. ?Patient not taking: Reported on 04/12/2022 01/10/22   [provider]  ?desloratadine (CLARINEX) 5 MG tablet Take 10 mg by mouth daily. ?Patient not taking: Reported on 04/12/2022 12/25/20   [provider]  ?docusate sodium (COLACE) 100 MG capsule Take 1 capsule (100 mg total) by mouth 2 (two) times daily. ?Patient not taking: Reported on 10/12/2017 11/25/16   Danae Orleans, PA-C  ?doxepin (SINEQUAN) 10 MG capsule Take 10 mg by mouth at bedtime. ?Patient not taking: Reported on 04/12/2022 06/17/21   [provider]  ?folic acid (FOLVITE) 1 MG tablet TAKE 1 TABLET BY MOUTH EVERY DAY ?Patient not taking: Reported on 04/12/2022 04/27/21   Benay Pike, MD  ?hydrochlorothiazide (HYDRODIURIL) 50 MG tablet Take 50 mg by mouth daily. ?Patient not taking: Reported on 04/12/2022 02/18/22   [provider]  ?meclizine (ANTIVERT) 12.5 MG tablet Take 12.5 mg by mouth 2 (two) times daily as needed for dizziness. ?Patient not taking: Reported on 04/12/2022 05/06/21   [provider]  ?montelukast (SINGULAIR) 10 MG tablet Take 10 mg by mouth daily. ?Patient not taking: Reported on 04/12/2022 05/28/21   [provider]  ?nitroGLYCERIN (NITROSTAT) 0.4 MG SL tablet Place 0.4 mg under the tongue every 5 (five) minutes as needed for chest pain.    [provider]  ?ondansetron (ZOFRAN-ODT) 8 MG disintegrating tablet Take 8 mg by mouth 3 (three) times daily as needed for nausea or vomiting. ?Patient not taking: Reported on 04/12/2022 10/09/21   [provider]  ?   ? ?Allergies    ?Sulfa antibiotics and Oxycodone   ? ?Review of Systems   ?Review of Systems  ?Unable to perform ROS: Mental status change  ? ?Physical Exam ?Updated Vital Signs ?BP 102/65   Pulse 98   Temp 97.9 ?F (36.6 ?C) (Oral)   Resp 17   Ht '5\' 9"'$  (1.753 m)   Wt 90.1 kg   SpO2 98%   BMI 29.33 kg/m?  ?Physical Exam ?Vitals and nursing note reviewed.  ?Constitutional:   ?   Appearance: He is well-developed.  ?HENT:  ?   Head: Normocephalic and atraumatic.  ?   Comments: No scalp tenderness  or significant head trauma noted ?   Mouth/Throat:  ?   Mouth: Mucous membranes are dry.  ?Eyes:  ?   Pupils: Pupils are equal, round, and reactive to light.  ?Cardiovascular:  ?   Rate and Rhythm: Regular rhythm. Tachycardia present.  ?   Heart sounds: Normal heart sounds.  ?Pulmonary:  ?   Effort: Pulmonary effort is normal.  ?   Breath sounds: Normal breath sounds.  ?Abdominal:  ?   Palpations: Abdomen is soft.  ?   Tenderness: There is no abdominal tenderness.  ?Musculoskeletal:  ?   Cervical back: No spinous process tenderness.  ?Skin: ?   General: Skin is warm and dry.  ?Neurological:  ?   Mental Status: He is alert. He is disoriented.  ?   Comments: Patient is generally weak though it is symmetric in all 4 extremities.  He is disoriented to month, day though he knows the year and the location he is in.  ? ? ?ED Results / Procedures / Treatments   ?Labs ?(all labs ordered are listed, but only abnormal results are displayed) ?Labs Reviewed  ?COMPREHENSIVE METABOLIC PANEL - Abnormal; Notable for the following components:  ?    Result Value  ? CO2 18 (*)   ? Creatinine, Ser 1.55 (*)   ? Calcium 8.6 (*)   ? Total Protein 5.9 (*)   ? AST 110 (*)   ? ALT 64 (*)   ? Alkaline Phosphatase 35 (*)   ? GFR,  Estimated 51 (*)   ? Anion gap 17 (*)   ? All other components within normal limits  ?ETHANOL - Abnormal; Notable for the following components:  ? Alcohol, Ethyl (B) 145 (*)   ? All other components within normal lim

## 2022-04-13 ENCOUNTER — Encounter (HOSPITAL_COMMUNITY): Payer: Self-pay | Admitting: Internal Medicine

## 2022-04-13 DIAGNOSIS — F1010 Alcohol abuse, uncomplicated: Secondary | ICD-10-CM | POA: Diagnosis not present

## 2022-04-13 DIAGNOSIS — E860 Dehydration: Secondary | ICD-10-CM | POA: Diagnosis present

## 2022-04-13 DIAGNOSIS — R55 Syncope and collapse: Secondary | ICD-10-CM | POA: Diagnosis present

## 2022-04-13 DIAGNOSIS — Z79899 Other long term (current) drug therapy: Secondary | ICD-10-CM | POA: Diagnosis not present

## 2022-04-13 DIAGNOSIS — Y92009 Unspecified place in unspecified non-institutional (private) residence as the place of occurrence of the external cause: Secondary | ICD-10-CM | POA: Diagnosis not present

## 2022-04-13 DIAGNOSIS — Z79890 Hormone replacement therapy (postmenopausal): Secondary | ICD-10-CM | POA: Diagnosis not present

## 2022-04-13 DIAGNOSIS — E872 Acidosis, unspecified: Secondary | ICD-10-CM | POA: Diagnosis present

## 2022-04-13 DIAGNOSIS — D696 Thrombocytopenia, unspecified: Secondary | ICD-10-CM | POA: Diagnosis present

## 2022-04-13 DIAGNOSIS — R7401 Elevation of levels of liver transaminase levels: Secondary | ICD-10-CM | POA: Diagnosis present

## 2022-04-13 DIAGNOSIS — E785 Hyperlipidemia, unspecified: Secondary | ICD-10-CM | POA: Diagnosis present

## 2022-04-13 DIAGNOSIS — Z87891 Personal history of nicotine dependence: Secondary | ICD-10-CM | POA: Diagnosis not present

## 2022-04-13 DIAGNOSIS — K219 Gastro-esophageal reflux disease without esophagitis: Secondary | ICD-10-CM | POA: Diagnosis present

## 2022-04-13 DIAGNOSIS — Z20822 Contact with and (suspected) exposure to covid-19: Secondary | ICD-10-CM | POA: Diagnosis present

## 2022-04-13 DIAGNOSIS — Y906 Blood alcohol level of 120-199 mg/100 ml: Secondary | ICD-10-CM | POA: Diagnosis present

## 2022-04-13 DIAGNOSIS — Z881 Allergy status to other antibiotic agents status: Secondary | ICD-10-CM | POA: Diagnosis not present

## 2022-04-13 DIAGNOSIS — W1830XA Fall on same level, unspecified, initial encounter: Secondary | ICD-10-CM | POA: Diagnosis present

## 2022-04-13 DIAGNOSIS — Z823 Family history of stroke: Secondary | ICD-10-CM | POA: Diagnosis not present

## 2022-04-13 DIAGNOSIS — I10 Essential (primary) hypertension: Secondary | ICD-10-CM | POA: Diagnosis present

## 2022-04-13 DIAGNOSIS — Z96643 Presence of artificial hip joint, bilateral: Secondary | ICD-10-CM | POA: Diagnosis present

## 2022-04-13 DIAGNOSIS — Z885 Allergy status to narcotic agent status: Secondary | ICD-10-CM | POA: Diagnosis not present

## 2022-04-13 DIAGNOSIS — E861 Hypovolemia: Secondary | ICD-10-CM | POA: Diagnosis present

## 2022-04-13 DIAGNOSIS — Z8249 Family history of ischemic heart disease and other diseases of the circulatory system: Secondary | ICD-10-CM | POA: Diagnosis not present

## 2022-04-13 DIAGNOSIS — N179 Acute kidney failure, unspecified: Secondary | ICD-10-CM | POA: Diagnosis present

## 2022-04-13 DIAGNOSIS — Z7141 Alcohol abuse counseling and surveillance of alcoholic: Secondary | ICD-10-CM | POA: Diagnosis not present

## 2022-04-13 DIAGNOSIS — E039 Hypothyroidism, unspecified: Secondary | ICD-10-CM | POA: Diagnosis present

## 2022-04-13 DIAGNOSIS — F10129 Alcohol abuse with intoxication, unspecified: Secondary | ICD-10-CM | POA: Diagnosis present

## 2022-04-13 DIAGNOSIS — Z801 Family history of malignant neoplasm of trachea, bronchus and lung: Secondary | ICD-10-CM | POA: Diagnosis not present

## 2022-04-13 LAB — URINALYSIS, ROUTINE W REFLEX MICROSCOPIC
Bilirubin Urine: NEGATIVE
Glucose, UA: NEGATIVE mg/dL
Hgb urine dipstick: NEGATIVE
Ketones, ur: 20 mg/dL — AB
Leukocytes,Ua: NEGATIVE
Nitrite: NEGATIVE
Protein, ur: 30 mg/dL — AB
Specific Gravity, Urine: 1.015 (ref 1.005–1.030)
pH: 5 (ref 5.0–8.0)

## 2022-04-13 LAB — COMPREHENSIVE METABOLIC PANEL
ALT: 59 U/L — ABNORMAL HIGH (ref 0–44)
AST: 97 U/L — ABNORMAL HIGH (ref 15–41)
Albumin: 3.6 g/dL (ref 3.5–5.0)
Alkaline Phosphatase: 36 U/L — ABNORMAL LOW (ref 38–126)
Anion gap: 18 — ABNORMAL HIGH (ref 5–15)
BUN: 17 mg/dL (ref 8–23)
CO2: 18 mmol/L — ABNORMAL LOW (ref 22–32)
Calcium: 8.7 mg/dL — ABNORMAL LOW (ref 8.9–10.3)
Chloride: 100 mmol/L (ref 98–111)
Creatinine, Ser: 1.41 mg/dL — ABNORMAL HIGH (ref 0.61–1.24)
GFR, Estimated: 57 mL/min — ABNORMAL LOW (ref >60)
Glucose, Bld: 97 mg/dL (ref 70–99)
Potassium: 3.8 mmol/L (ref 3.5–5.1)
Sodium: 136 mmol/L (ref 135–145)
Total Bilirubin: 1.9 mg/dL — ABNORMAL HIGH (ref 0.3–1.2)
Total Protein: 5.6 g/dL — ABNORMAL LOW (ref 6.5–8.1)

## 2022-04-13 LAB — CBC WITH DIFFERENTIAL/PLATELET
Abs Immature Granulocytes: 0.04 10*3/uL (ref 0.00–0.07)
Basophils Absolute: 0 10*3/uL (ref 0.0–0.1)
Basophils Relative: 0 %
Eosinophils Absolute: 0.2 10*3/uL (ref 0.0–0.5)
Eosinophils Relative: 3 %
HCT: 30.9 % — ABNORMAL LOW (ref 39.0–52.0)
Hemoglobin: 10.7 g/dL — ABNORMAL LOW (ref 13.0–17.0)
Immature Granulocytes: 1 %
Lymphocytes Relative: 17 %
Lymphs Abs: 1 10*3/uL (ref 0.7–4.0)
MCH: 36.5 pg — ABNORMAL HIGH (ref 26.0–34.0)
MCHC: 34.6 g/dL (ref 30.0–36.0)
MCV: 105.5 fL — ABNORMAL HIGH (ref 80.0–100.0)
Monocytes Absolute: 0.7 10*3/uL (ref 0.1–1.0)
Monocytes Relative: 12 %
Neutro Abs: 4.1 10*3/uL (ref 1.7–7.7)
Neutrophils Relative %: 67 %
Platelets: 78 10*3/uL — ABNORMAL LOW (ref 150–400)
RBC: 2.93 MIL/uL — ABNORMAL LOW (ref 4.22–5.81)
RDW: 14.4 % (ref 11.5–15.5)
WBC: 6.1 10*3/uL (ref 4.0–10.5)
nRBC: 0 % (ref 0.0–0.2)

## 2022-04-13 LAB — PROTIME-INR
INR: 0.9 (ref 0.8–1.2)
Prothrombin Time: 12.2 seconds (ref 11.4–15.2)

## 2022-04-13 LAB — PHOSPHORUS: Phosphorus: 1.7 mg/dL — ABNORMAL LOW (ref 2.5–4.6)

## 2022-04-13 LAB — MAGNESIUM: Magnesium: 2.1 mg/dL (ref 1.7–2.4)

## 2022-04-13 MED ORDER — DEXTROSE 5 % IV SOLN
30.0000 mmol | Freq: Once | INTRAVENOUS | Status: AC
  Administered 2022-04-13: 30 mmol via INTRAVENOUS
  Filled 2022-04-13: qty 10

## 2022-04-13 MED ORDER — MAGNESIUM 400 MG PO TABS
400.0000 mg | ORAL_TABLET | Freq: Every day | ORAL | Status: DC
Start: 2022-04-13 — End: 2022-04-13

## 2022-04-13 MED ORDER — MAGNESIUM OXIDE -MG SUPPLEMENT 400 (240 MG) MG PO TABS
400.0000 mg | ORAL_TABLET | Freq: Every day | ORAL | Status: DC
  Administered 2022-04-14: 400 mg via ORAL
  Filled 2022-04-13: qty 1

## 2022-04-13 MED ORDER — LACTATED RINGERS IV SOLN: INTRAVENOUS | Status: AC

## 2022-04-13 MED ORDER — ASPIRIN EC 81 MG PO TBEC
81.0000 mg | DELAYED_RELEASE_TABLET | Freq: Every day | ORAL | Status: DC
  Administered 2022-04-13: 81 mg via ORAL
  Filled 2022-04-13 (×2): qty 1

## 2022-04-13 MED ORDER — POTASSIUM CHLORIDE 2 MEQ/ML IV SOLN
INTRAVENOUS | Status: AC
  Filled 2022-04-13 (×3): qty 1000

## 2022-04-13 MED ORDER — HYDROMORPHONE HCL 1 MG/ML IJ SOLN
0.5000 mg | Freq: Once | INTRAMUSCULAR | Status: AC
  Administered 2022-04-13: 0.5 mg via INTRAVENOUS
  Filled 2022-04-13: qty 0.5

## 2022-04-13 MED ORDER — CHLORDIAZEPOXIDE HCL 5 MG PO CAPS
10.0000 mg | ORAL_CAPSULE | Freq: Once | ORAL | Status: AC
  Administered 2022-04-13: 10 mg via ORAL
  Filled 2022-04-13: qty 2

## 2022-04-13 MED ORDER — CHLORDIAZEPOXIDE HCL 5 MG PO CAPS
10.0000 mg | ORAL_CAPSULE | Freq: Three times a day (TID) | ORAL | Status: DC
Start: 2022-04-13 — End: 2022-04-14
  Administered 2022-04-13 – 2022-04-14 (×3): 10 mg via ORAL
  Filled 2022-04-13 (×2): qty 2
  Filled 2022-04-13: qty 1
  Filled 2022-04-13: qty 2

## 2022-04-13 MED ORDER — NITROGLYCERIN 0.4 MG SL SUBL: 0.4000 mg | SUBLINGUAL_TABLET | SUBLINGUAL | Status: DC | PRN

## 2022-04-13 NOTE — Evaluation (Signed)
Physical Therapy Evaluation ?Patient Details ?Name: Philip Humphrey ?MRN: [id number] ?DOB: 12-19-1961 ?Today's Date: 04/13/2022 ? ?History of Present Illness ? Pt is a 61 y/o male presenting on 4/23 after unwitnessed fall at home. Found with alcohol intoxication, dehydration, AKI. PMH includes: hemochromatosis, B12 deficiency, HTN, alcohol abuse, hx of leaving AMA, neuropathy, R THA.  ?Clinical Impression ? Patient presented to the ED on 4/23 with alcohol intoxication, dehydration, and AKI. Pt's impairments include decreased cognition, balance, strength, and activity tolerance. These impairments are limiting his ability to safely and independently transfer, ambulate, and navigate stairs. Patient requires supervision to min guard A with all functional mobility while using RW for transfers and ambulation. Pt had significant difficulty dual tasking during ambulation and required cues for RW proximity and upright posture throughout. SPT recommending Home Health PT upon D/C due to mobility deficits and fall history. PT will continue to follow acutely. ?   ?   ? ?Recommendations for follow up therapy are one component of a multi-disciplinary discharge planning process, led by the attending physician.  Recommendations may be updated based on patient status, additional functional criteria and insurance authorization. ? ?Follow Up Recommendations Home health PT ? ?  ?Assistance Recommended at Discharge Frequent or constant Supervision/Assistance  ?Patient can return home with the following ? A little help with bathing/dressing/bathroom;Assistance with cooking/housework;Direct supervision/assist for medications management;Direct supervision/assist for financial management;Assist for transportation;Help with stairs or ramp for entrance ? ?  ?Equipment Recommendations Rolling walker (2 wheels)  ?Recommendations for Other Services ?    ?  ?Functional Status Assessment Patient has had a recent decline in their functional status  and demonstrates the ability to make significant improvements in function in a reasonable and predictable amount of time.  ? ?  ?Precautions / Restrictions Precautions ?Precautions: Fall ?Restrictions ?Weight Bearing Restrictions: No  ? ?  ? ?Mobility ? Bed Mobility ?Overal bed mobility: Needs Assistance ?Bed Mobility: Sit to Supine, Supine to Sit ?  ?  ?Supine to sit: Min guard ?Sit to supine: Supervision ?  ?General bed mobility comments: required min guard A for sitting at EOB for safety and steadying. Required supervision for safety returning to supine at end of session. He needed some cues for LE management and sitting with hips in line with EOB. ?  ? ?Transfers ?Overall transfer level: Needs assistance ?Equipment used: Rolling walker (2 wheels) ?Transfers: Sit to/from Stand ?Sit to Stand: Min guard ?  ?  ?  ?  ?  ?General transfer comment: required min guard A for steadying and safety with sit<>stand. Pt with flexed forward posture throughout transfer and once standing ?  ? ?Ambulation/Gait ?Ambulation/Gait assistance: Min guard ?Gait Distance (Feet): 200 Feet ?Assistive device: Rolling walker (2 wheels) ?Gait Pattern/deviations: Step-through pattern, Trunk flexed, Decreased dorsiflexion - right, Decreased dorsiflexion - left, Decreased stride length ?Gait velocity: decreased ?Gait velocity interpretation: 1.31 - 2.62 ft/sec, indicative of limited community ambulator ?  ?General Gait Details: required min guard A for safety and steadying while using RW for BUE support. Pt required cues for RW proximity and for obtaining an upright posture with ambulation. He needed cues to look up while walking for safety awareness. Pt did have difficulty navigating RW in room, hitting RW against tray table, edge of ED stretcher, trash can, and door. Pt had significant difficulty dual tasking with ambulation and had to decrease gait speed further to perform cognitive tasks. ? ?Stairs ?  ?  ?  ?  ?  ? ?Wheelchair Mobility ?   ? ?  Modified Rankin (Stroke Patients Only) ?  ? ?  ? ?Balance Overall balance assessment: Needs assistance ?Sitting-balance support: Bilateral upper extremity supported, Feet unsupported ?Sitting balance-Leahy Scale: Fair ?Sitting balance - Comments: preferred use of BUE to support himself in sitting with forward flexed posture. However, he was able to support himself with single UE in sitting when adjusting glasses. ?  ?Standing balance support: Bilateral upper extremity supported, During functional activity, Reliant on assistive device for balance ?Standing balance-Leahy Scale: Poor ?Standing balance comment: reliant on UE support using RW ?  ?  ?  ?  ?  ?  ?  ?  ?  ?  ?  ?   ? ? ? ?Pertinent Vitals/Pain Pain Assessment ?Pain Assessment: Faces ?Faces Pain Scale: No hurt  ? ? ?Home Living Family/patient expects to be discharged to:: Private residence ?Living Arrangements: Children;Other (Comment) (daughter, grandson) ?Available Help at Discharge: Family;Available PRN/intermittently ?Type of Home: House ?Home Access: Stairs to enter ?Entrance Stairs-Rails: Can reach both ?Entrance Stairs-Number of Steps: 4 ?  ?Home Layout: One level ?Home Equipment: Conservation officer, nature (2 wheels);Cane - single point;Tub bench ?Additional Comments: daughter available PRN. Pt states she can work from home if needed.  ?  ?Prior Function Prior Level of Function : Independent/Modified Independent;History of Falls (last six months);Driving ?  ?  ?  ?  ?  ?  ?Mobility Comments: reports using RW or cane as needed ?ADLs Comments: driving, indep ADLs/IADLs ?  ? ? ?Hand Dominance  ? Dominant Hand: Right ? ?  ?Extremity/Trunk Assessment  ? Upper Extremity Assessment ?Upper Extremity Assessment: Defer to OT evaluation ?  ? ?Lower Extremity Assessment ?Lower Extremity Assessment: Generalized weakness ?  ? ?Cervical / Trunk Assessment ?Cervical / Trunk Assessment: Normal  ?Communication  ? Communication: No difficulties  ?Cognition Arousal/Alertness:  Awake/alert ?Behavior During Therapy: [facility] for tasks assessed/performed ?Overall Cognitive Status: Impaired/Different from baseline ?Area of Impairment: Memory, Following commands, Safety/judgement, Orientation, Problem solving ?  ?  ?  ?  ?  ?  ?  ?  ?Orientation Level: Disoriented to, Time ?  ?Memory: Decreased short-term memory ?Following Commands: Follows one step commands consistently ?Safety/Judgement: Decreased awareness of safety, Decreased awareness of deficits ?  ?Problem Solving: Slow processing, Requires verbal cues ?  ?  ?  ? ?  ?General Comments   ? ?  ?Exercises    ? ?Assessment/Plan  ?  ?PT Assessment Patient needs continued PT services  ?PT Problem List Decreased strength;Decreased activity tolerance;Decreased balance;Decreased mobility;Decreased cognition;Decreased safety awareness ? ?   ?  ?PT Treatment Interventions DME instruction;Gait training;Stair training;Functional mobility training;Therapeutic activities;Therapeutic exercise;Balance training;Cognitive remediation;Patient/family education   ? ?PT Goals (Current goals can be found in the Care Plan section)  ?Acute Rehab PT Goals ?Patient Stated Goal: to return home ?PT Goal Formulation: With patient ?Time For Goal Achievement: 04/27/22 ?Potential to Achieve Goals: Good ? ?  ?Frequency Min 3X/week ?  ? ? ?Co-evaluation   ?  ?  ?  ?  ? ? ?  ?AM-PAC PT "6 Clicks" Mobility  ?Outcome Measure Help needed turning from your back to your side while in a flat bed without using bedrails?: None ?Help needed moving from lying on your back to sitting on the side of a flat bed without using bedrails?: A Little ?Help needed moving to and from a bed to a chair (including a wheelchair)?: A Little ?Help needed standing up from a chair using your arms (e.g., wheelchair or bedside chair)?: A Little ?Help  needed to walk in hospital room?: A Little ?Help needed climbing 3-5 steps with a railing? : A Lot ?6 Click Score: 18 ? ?  ?End of Session Equipment Utilized  During Treatment: Gait belt ?Activity Tolerance: Patient tolerated treatment well ?Patient left: in bed;with call bell/phone within reach;Other (comment) (on ED stretcher with soft padding on bed rails) ?Nurs

## 2022-04-13 NOTE — Evaluation (Signed)
Occupational Therapy Evaluation ?Patient Details ?Name: Philip Humphrey ?MRN: [id number] ?DOB: 1961-10-27 ?Today's Date: 04/13/2022 ? ? ?History of Present Illness Pt is a 61 y/o male presenting on 4/23 after unwitnessed fall at home. Found with alcohol intoxication, dehydration, AKI. PMH includes: hemochromatosis, B12 deficiency, HTN, alcohol abuse, hx of leaving AMA, neuropathy, R THA.  ? ?Clinical Impression ?  ?PTA patient reports independent with ADLs, mobility and driving, at times needing to use RW/cane due to unsteadiness and reports many falls in the last 6 months. Patient currently requires min guard for mobility and transfers using RW, up to min assist for ADLs.  Pt demonstrating decreased safety awareness, problem solving, and awareness.  Based on performance today, anticipate patient is near baseline and believe he will benefit from continued OT services acutely to optimize independence, safety.  ?   ? ?Recommendations for follow up therapy are one component of a multi-disciplinary discharge planning process, led by the attending physician.  Recommendations may be updated based on patient status, additional functional criteria and insurance authorization.  ? ?Follow Up Recommendations ? No OT follow up  ?  ?Assistance Recommended at Discharge Frequent or constant Supervision/Assistance  ?Patient can return home with the following A little help with walking and/or transfers;A little help with bathing/dressing/bathroom;Assistance with cooking/housework;Direct supervision/assist for medications management;Direct supervision/assist for financial management;Assist for transportation;Help with stairs or ramp for entrance ? ?  ?Functional Status Assessment ? Patient has had a recent decline in their functional status and demonstrates the ability to make significant improvements in function in a reasonable and predictable amount of time.  ?Equipment Recommendations ? None recommended by OT  ?  ?Recommendations  for Other Services   ? ? ?  ?Precautions / Restrictions Precautions ?Precautions: Fall ?Restrictions ?Weight Bearing Restrictions: No  ? ?  ? ?Mobility Bed Mobility ?Overal bed mobility: Needs Assistance ?Bed Mobility: Sit to Supine, Supine to Sit ?  ?  ?Supine to sit: Min guard ?Sit to supine: Supervision ?  ?General bed mobility comments: min guard for safety to ascend from stretcher but no physical assist required ?  ? ?Transfers ?  ?  ?  ?  ?  ?  ?  ?  ?  ?  ?  ? ?  ?Balance Overall balance assessment: Needs assistance ?Sitting-balance support: Feet unsupported, No upper extremity supported ?Sitting balance-Leahy Scale: Fair ?  ?  ?Standing balance support: Bilateral upper extremity supported, During functional activity, Reliant on assistive device for balance ?Standing balance-Leahy Scale: Poor ?Standing balance comment: reliant on UE support using RW ?  ?  ?  ?  ?  ?  ?  ?  ?  ?  ?  ?   ? ?ADL either performed or assessed with clinical judgement  ? ?ADL Overall ADL's : Needs assistance/impaired ?  ?  ?Grooming: Set up;Sitting ?  ?  ?  ?  ?  ?Upper Body Dressing : Set up;Sitting ?  ?Lower Body Dressing: Minimal assistance;Sit to/from stand ?  ?Toilet Transfer: Min guard;Ambulation;Rolling walker (2 wheels) ?  ?  ?  ?  ?  ?Functional mobility during ADLs: Min guard;Rolling walker (2 wheels);Cueing for safety ?   ? ? ? ?Vision Baseline Vision/History: 1 Wears glasses ?Ability to See in Adequate Light: 0 Adequate ?Patient Visual Report: No change from baseline ?Vision Assessment?: No apparent visual deficits  ?   ?Perception   ?  ?Praxis   ?  ? ?Pertinent Vitals/Pain Pain Assessment ?Pain Assessment: Faces ?Faces Pain  Scale: No hurt  ? ? ? ?Hand Dominance Right ?  ?Extremity/Trunk Assessment Upper Extremity Assessment ?Upper Extremity Assessment: Generalized weakness ?  ?Lower Extremity Assessment ?Lower Extremity Assessment: Defer to PT evaluation ?  ?Cervical / Trunk Assessment ?Cervical / Trunk Assessment:  Normal ?  ?Communication Communication ?Communication: No difficulties ?  ?Cognition Arousal/Alertness: Awake/alert ?Behavior During Therapy: [facility] for tasks assessed/performed ?Overall Cognitive Status: Impaired/Different from baseline ?Area of Impairment: Memory, Following commands, Safety/judgement, Orientation, Problem solving, Awareness, Attention ?  ?  ?  ?  ?  ?  ?  ?  ?Orientation Level: Disoriented to, Time ?Current Attention Level: Selective ?Memory: Decreased short-term memory ?Following Commands: Follows one step commands consistently, Follows one step commands with increased time ?Safety/Judgement: Decreased awareness of safety, Decreased awareness of deficits ?Awareness: Emergent ?Problem Solving: Slow processing, Requires verbal cues, Difficulty sequencing ?General Comments: pt with difficulty sequencing months in reverse and stops while counting backwards voicing "I got bored, you know I can do that'.  Disoriented to time and demonstrating decreased awareness to deficits.  Continue cognitive assessment. ?  ?  ?General Comments  VSS ? ?  ?Exercises   ?  ?Shoulder Instructions    ? ? ?Home Living Family/patient expects to be discharged to:: Private residence ?Living Arrangements: Children;Other (Comment) (daughter, grandson) ?Available Help at Discharge: Family;Available PRN/intermittently ?Type of Home: House ?Home Access: Stairs to enter ?Entrance Stairs-Number of Steps: 4 ?Entrance Stairs-Rails: Can reach both ?Home Layout: One level ?  ?  ?Bathroom Shower/Tub: Tub/shower unit ?  ?Bathroom Toilet: Standard ?  ?  ?Home Equipment: Conservation officer, nature (2 wheels);Cane - single point;Tub bench ?  ?Additional Comments: daughter available PRN. Pt states she can work from home if needed. ?  ? ?  ?Prior Functioning/Environment Prior Level of Function : Independent/Modified Independent;History of Falls (last six months);Driving ?  ?  ?  ?  ?  ?  ?Mobility Comments: reports using RW or cane as needed ?ADLs Comments:  driving, indep ADLs/IADLs ?  ? ?  ?  ?OT Problem List: Decreased strength;Decreased activity tolerance;Impaired balance (sitting and/or standing);Decreased cognition;Decreased safety awareness;Decreased knowledge of use of DME or AE;Decreased knowledge of precautions ?  ?   ?OT Treatment/Interventions: Self-care/ADL training;Therapeutic exercise;DME and/or AE instruction;Therapeutic activities;Cognitive remediation/compensation;Patient/family education;Balance training  ?  ?OT Goals(Current goals can be found in the care plan section) Acute Rehab OT Goals ?Patient Stated Goal: home ?OT Goal Formulation: With patient ?Time For Goal Achievement: 04/27/22 ?Potential to Achieve Goals: Good  ?OT Frequency: Min 2X/week ?  ? ?Co-evaluation   ?  ?  ?  ?  ? ?  ?AM-PAC OT "6 Clicks" Daily Activity     ?Outcome Measure Help from another person eating meals?: None ?Help from another person taking care of personal grooming?: A Little ?Help from another person toileting, which includes using toliet, bedpan, or urinal?: A Little ?Help from another person bathing (including washing, rinsing, drying)?: A Little ?Help from another person to put on and taking off regular upper body clothing?: A Little ?Help from another person to put on and taking off regular lower body clothing?: A Little ?6 Click Score: 19 ?  ?End of Session Equipment Utilized During Treatment: Gait belt;Rolling walker (2 wheels) ?Nurse Communication: Mobility status ? ?Activity Tolerance: Patient tolerated treatment well ?Patient left: in bed;with call bell/phone within reach ? ?OT Visit Diagnosis: Other abnormalities of gait and mobility (R26.89);Muscle weakness (generalized) (M62.81);Other symptoms and signs involving cognitive function  ?              ?  Time: 7262-0355 ?OT Time Calculation (min): 19 min ?Charges:  OT General Charges ?$OT Visit: 1 Visit ?OT Evaluation ?$OT Eval Moderate Complexity: 1 Mod ? ?Jolaine Artist, OT ?Acute Rehabilitation Services ?Pager  [phone] ?Office [phone] ? ? ?Delight Stare ?04/13/2022, 11:44 AM ?

## 2022-04-13 NOTE — ED Notes (Signed)
Singh MD at bedside

## 2022-04-13 NOTE — ED Notes (Signed)
Breakfast order placed ?

## 2022-04-13 NOTE — Progress Notes (Signed)
?                                  PROGRESS NOTE                                             ?                                                                                                                     ?                                         ? ? Patient Demographics:  ? ? Philip Humphrey, is a 61 y.o. male, DOB - [birth year], TIR:[id number] ? ?Outpatient Primary MD for the patient is Philip Huh, NP    LOS - 0  Admit date - 04/12/2022   ? ?Chief Complaint  ?Patient presents with  ? Fall  ? Loss of Consciousness  ?    ? ?Brief Narrative (HPI from H&P)   - 61 y.o. male with medical history significant for hemochromatosis, last phlebotomy in August 2022, B12 deficiency, essential hypertension, hypothyroidism, alcohol abuse, history of medical noncompliance with leaving [facility], who presented to [facility] ED after an unwitnessed fall at home, he says he did not lose consciousness but his lungs got weak, did not lose bowel or bladder control, no tongue bites or witnessed seizure-like like activity.  He does drink a lot of alcohol and upon ER arrival his work-up was consistent with dehydration, AKI, alcohol level was 145 and he was admitted to the hospital. ? ? Subjective:  ? ? Philip Humphrey today has, No headache, No chest pain, No abdominal pain - No Nausea, No new weakness tingling or numbness, no SOB ? ? Assessment  & Plan :  ? ?1.  Alcohol intoxication with dehydration causing mechanical fall.  Alcohol level was 145 upon admission, admits to drinking plenty of alcohol daily, will check orthostatics, hydrate, PT OT, watch for alcohol withdrawal, CIWA protocol with Librium. ? ?2.  AKI .  Due to dehydration, hydrate and monitor. ? ?3.  Dehydration induced lactic acidosis.  No signs of sepsis.  Hydrate. ? ?4.  Asymptomatic transaminitis consistent with alcohol abuse, counseled to quit.  Outpatient PCP monitoring ? ?5. Hypophosphatemia.  Replace. ? ?6.  Chronic  thrombocytopenia.  Consistent with alcohol abuse, outpatient liver function monitoring and platelet monitoring. ? ?7.  Hypothyroidism.  On Synthroid. ? ?8.  Dyslipidemia.  On statin. ? ?   ? ?Condition - Extremely Guarded ? ?Family Communication  : Called daughter Philip Humphrey [phone] on 04/13/2022 at 8:05 AM and message left ? ?Code Status :  Full ? ?Consults  :  None ? ?  PUD Prophylaxis :  ? ? Procedures  :    ? ?CT head and C-spine.  Nonacute. ? ?   ? ?Disposition Plan  :   ? ?Status is: Observation ? ?DVT Prophylaxis  :   ? ?enoxaparin (LOVENOX) injection 40 mg Start: 04/13/22 1000 ? ? ?Lab Results  ?Component Value Date  ? PLT 78 (L) 04/13/2022  ? ? ?Diet :  ?Diet Order   ? ?       ?  Diet Heart Room service appropriate? Yes; Fluid consistency: Thin  Diet effective now       ?  ? ?  ?  ? ?  ?  ? ?Inpatient Medications ? ?Scheduled Meds: ? atorvastatin  80 mg Oral Daily  ? chlordiazePOXIDE  10 mg Oral TID  ? chlordiazePOXIDE  10 mg Oral Once  ? enoxaparin (LOVENOX) injection  40 mg Subcutaneous Z00P  ? folic acid  1 mg Oral Daily  ? levothyroxine  175 mcg Oral Daily  ? multivitamin with minerals  1 tablet Oral Daily  ? thiamine  100 mg Oral Daily  ? Or  ? thiamine  100 mg Intravenous Daily  ? ?Continuous Infusions: ? lactated ringers with kcl 100 mL/hr at 04/13/22 0657  ? lactated ringers    ? sodium phosphate  Dextrose 5% IVPB 30 mmol (04/13/22 0658)  ? ?PRN Meds:.acetaminophen, albuterol, LORazepam **OR** LORazepam, melatonin, ondansetron (ZOFRAN) IV, polyethylene glycol ? ?Antibiotics  :   ? ?Anti-infectives (From admission, onward)  ? ? None  ? ?  ? ? ? Time Spent in minutes  30 ? ? ?Lala Lund M.D on 04/13/2022 at 8:10 AM ? ?To page go to [email or web address]  ? ?[facility] -  Office  [phone] ? ?See all Orders from today for further details ? ? ? Objective:  ? ?Vitals:  ? 04/13/22 0640 04/13/22 0645 04/13/22 0730 04/13/22 0739  ?BP: (!) 103/59 103/71 106/70   ?Pulse: 98 85 79   ?Resp:  15 17   ?Temp:     98.1 ?F (36.7 ?C)  ?TempSrc:    Oral  ?SpO2:  98% 98%   ?Weight:      ?Height:      ? ? ?Wt Readings from Last 3 Encounters:  ?04/12/22 90.1 kg  ?03/05/22 90.1 kg  ?02/17/22 99 kg  ? ? ? ?Intake/Output Summary (Last 24 hours) at 04/13/2022 0810 ?Last data filed at 04/13/2022 0006 ?Gross per 24 hour  ?Intake 2100 ml  ?Output --  ?Net 2100 ml  ? ? ? ?Physical Exam ? ?Awake Alert x1, No new F.N deficits,   ?[place].AT,PERRAL ?Supple Neck, No JVD,   ?Symmetrical Chest wall movement, Good air movement bilaterally, CTAB ?RRR,No Gallops,Rubs or new Murmurs,  ?+ve B.Sounds, Abd Soft, No tenderness,   ?No Cyanosis, Clubbing or edema  ?  ? ? Data Review:  ? ? ?CBC ?Recent Labs  ?Lab 04/07/22 ?3335 04/12/22 ?2003 04/12/22 ?2011 04/13/22 ?0405  ?WBC 7.3 5.8  --  6.1  ?HGB 13.5 10.6* 10.9* 10.7*  ?HCT 37.3* 31.4* 32.0* 30.9*  ?PLT 155 93*  --  78*  ?MCV 103.0* 107.5*  --  105.5*  ?MCH 37.3* 36.3*  --  36.5*  ?MCHC 36.2* 33.8  --  34.6  ?RDW 14.4 14.3  --  14.4  ?LYMPHSABS 1.7 1.1  --  1.0  ?MONOABS 0.5 0.7  --  0.7  ?EOSABS 0.3 0.2  --  0.2  ?BASOSABS 0.0 0.0  --  0.0  ? ? ?  Electrolytes ?Recent Labs  ?Lab 04/07/22 ?4481 04/12/22 ?2003 04/12/22 ?2011 04/12/22 ?2127 04/12/22 ?2248 04/13/22 ?0405  ?NA 138 136 138  --   --  136  ?K 3.9 3.6 3.8  --   --  3.8  ?CL 98 101 101  --   --  100  ?CO2 23 18*  --   --   --  18*  ?GLUCOSE 85 80 76  --   --  97  ?BUN 50* 19 19  --   --  17  ?CREATININE 1.25* 1.55* 1.60*  --   --  1.41*  ?CALCIUM 11.2* 8.6*  --   --   --  8.7*  ?AST 45* 110*  --   --   --  97*  ?ALT 39 64*  --   --   --  59*  ?ALKPHOS 55 35*  --   --   --  36*  ?BILITOT 1.3* 1.1  --   --   --  1.9*  ?ALBUMIN 4.7 3.8  --   --   --  3.6  ?MG  --  1.3*  --   --   --  2.1  ?LATICACIDVEN  --   --   --  3.3* 2.6*  --   ?AMMONIA  --  19  --   --   --   --   ? ?  ? ? ?Radiology Reports ?DG Chest 1 View ? ?Result Date: 04/12/2022 ?CLINICAL DATA:  Chest pain and hypotension. EXAM: CHEST  1 VIEW COMPARISON:  Portable chest 07/07/2021. FINDINGS:  The heart size and mediastinal contours are within normal limits. Both lungs are clear with chronic asymmetric elevation of the right diaphragm. The visualized skeletal structures are unremarkable apart from an old distal right clavicle resection. IMPRESSION: No evidence of acute chest disease or interval changes Electronically Signed   By: Telford Nab M.D.   On: 04/12/2022 20:48  ? ?CT Head Wo Contrast ? ?Result Date: 04/12/2022 ?CLINICAL DATA:  Head trauma.  Possible syncopal episode. EXAM: CT HEAD WITHOUT CONTRAST TECHNIQUE: Contiguous axial images were obtained from the base of the skull through the vertex without intravenous contrast. RADIATION DOSE REDUCTION: This exam was performed according to the departmental dose-optimization program which includes automated exposure control, adjustment of the mA and/or kV according to patient size and/or use of iterative reconstruction technique. COMPARISON:  July 07, 2021 FINDINGS: Brain: No evidence of acute infarction, hemorrhage, hydrocephalus, extra-axial collection or mass lesion/mass effect. Vascular: No hyperdense vessel or unexpected calcification. Skull: Normal. Negative for fracture or focal lesion. Sinuses/Orbits: No acute finding. Other: None. IMPRESSION: No acute intracranial abnormality. Electronically Signed   By: Fidela Salisbury M.D.   On: 04/12/2022 20:31  ? ?CT Cervical Spine Wo Contrast ? ?Result Date: 04/12/2022 ?CLINICAL DATA:  Neck trauma. EXAM: CT CERVICAL SPINE WITHOUT CONTRAST TECHNIQUE: Multidetector CT imaging of the cervical spine was performed without intravenous contrast. Multiplanar CT image reconstructions were also generated. RADIATION DOSE REDUCTION: This exam was performed according to the departmental dose-optimization program which includes automated exposure control, adjustment of the mA and/or kV according to patient size and/or use of iterative reconstruction technique. COMPARISON:  None. FINDINGS: Alignment: Normal. Skull  base and vertebrae: No acute fracture. No primary bone lesion or focal pathologic process. Soft tissues and spinal canal: No prevertebral fluid or swelling. No visible canal hematoma. Disc levels:  Multilevel

## 2022-04-14 ENCOUNTER — Encounter: Payer: Self-pay | Admitting: Hematology and Oncology

## 2022-04-14 ENCOUNTER — Other Ambulatory Visit (HOSPITAL_COMMUNITY): Payer: Self-pay

## 2022-04-14 DIAGNOSIS — F1010 Alcohol abuse, uncomplicated: Secondary | ICD-10-CM

## 2022-04-14 DIAGNOSIS — N179 Acute kidney failure, unspecified: Secondary | ICD-10-CM

## 2022-04-14 DIAGNOSIS — E039 Hypothyroidism, unspecified: Secondary | ICD-10-CM

## 2022-04-14 DIAGNOSIS — R55 Syncope and collapse: Secondary | ICD-10-CM | POA: Diagnosis not present

## 2022-04-14 LAB — CBC WITH DIFFERENTIAL/PLATELET
Abs Immature Granulocytes: 0.02 10*3/uL (ref 0.00–0.07)
Basophils Absolute: 0 10*3/uL (ref 0.0–0.1)
Basophils Relative: 0 %
Eosinophils Absolute: 0.2 10*3/uL (ref 0.0–0.5)
Eosinophils Relative: 4 %
HCT: 27.6 % — ABNORMAL LOW (ref 39.0–52.0)
Hemoglobin: 9.5 g/dL — ABNORMAL LOW (ref 13.0–17.0)
Immature Granulocytes: 1 %
Lymphocytes Relative: 20 %
Lymphs Abs: 0.8 10*3/uL (ref 0.7–4.0)
MCH: 35.8 pg — ABNORMAL HIGH (ref 26.0–34.0)
MCHC: 34.4 g/dL (ref 30.0–36.0)
MCV: 104.2 fL — ABNORMAL HIGH (ref 80.0–100.0)
Monocytes Absolute: 0.4 10*3/uL (ref 0.1–1.0)
Monocytes Relative: 11 %
Neutro Abs: 2.5 10*3/uL (ref 1.7–7.7)
Neutrophils Relative %: 64 %
Platelets: 52 10*3/uL — ABNORMAL LOW (ref 150–400)
RBC: 2.65 MIL/uL — ABNORMAL LOW (ref 4.22–5.81)
RDW: 14 % (ref 11.5–15.5)
WBC: 3.9 10*3/uL — ABNORMAL LOW (ref 4.0–10.5)
nRBC: 0 % (ref 0.0–0.2)

## 2022-04-14 LAB — COMPREHENSIVE METABOLIC PANEL
ALT: 41 U/L (ref 0–44)
AST: 48 U/L — ABNORMAL HIGH (ref 15–41)
Albumin: 3.1 g/dL — ABNORMAL LOW (ref 3.5–5.0)
Alkaline Phosphatase: 32 U/L — ABNORMAL LOW (ref 38–126)
Anion gap: 10 (ref 5–15)
BUN: 10 mg/dL (ref 8–23)
CO2: 24 mmol/L (ref 22–32)
Calcium: 8.3 mg/dL — ABNORMAL LOW (ref 8.9–10.3)
Chloride: 99 mmol/L (ref 98–111)
Creatinine, Ser: 0.77 mg/dL (ref 0.61–1.24)
GFR, Estimated: >60 mL/min (ref >60)
Glucose, Bld: 98 mg/dL (ref 70–99)
Potassium: 3 mmol/L — ABNORMAL LOW (ref 3.5–5.1)
Sodium: 133 mmol/L — ABNORMAL LOW (ref 135–145)
Total Bilirubin: 0.9 mg/dL (ref 0.3–1.2)
Total Protein: 5 g/dL — ABNORMAL LOW (ref 6.5–8.1)

## 2022-04-14 LAB — MAGNESIUM: Magnesium: 1.3 mg/dL — ABNORMAL LOW (ref 1.7–2.4)

## 2022-04-14 LAB — BRAIN NATRIURETIC PEPTIDE: B Natriuretic Peptide: 58.3 pg/mL (ref 0.0–100.0)

## 2022-04-14 LAB — PHOSPHORUS: Phosphorus: 2.3 mg/dL — ABNORMAL LOW (ref 2.5–4.6)

## 2022-04-14 MED ORDER — MAGNESIUM SULFATE 4 GM/100ML IV SOLN
4.0000 g | Freq: Once | INTRAVENOUS | Status: AC
  Administered 2022-04-14: 4 g via INTRAVENOUS
  Filled 2022-04-14: qty 100

## 2022-04-14 MED ORDER — FOLIC ACID 1 MG PO TABS
1.0000 mg | ORAL_TABLET | Freq: Every day | ORAL | 0 refills | Status: DC
  Filled 2022-04-14: qty 30, 30d supply, fill #0

## 2022-04-14 MED ORDER — THIAMINE HCL 100 MG PO TABS
100.0000 mg | ORAL_TABLET | Freq: Every day | ORAL | 0 refills | Status: DC
  Filled 2022-04-14: qty 30, 30d supply, fill #0

## 2022-04-14 MED ORDER — SODIUM PHOSPHATES 45 MMOLE/15ML IV SOLN
30.0000 mmol | Freq: Once | INTRAVENOUS | Status: AC
  Administered 2022-04-14: 30 mmol via INTRAVENOUS
  Filled 2022-04-14: qty 10

## 2022-04-14 NOTE — Progress Notes (Signed)
Pt's daughter has health care and financial POA ---not legal guardian  ?

## 2022-04-14 NOTE — Plan of Care (Signed)
  Problem: Education: Goal: Knowledge of General Education information will improve Description: Including pain rating scale, medication(s)/side effects and non-pharmacologic comfort measures Outcome: Progressing   Problem: Pain Managment: Goal: General experience of comfort will improve Outcome: Progressing   Problem: Safety: Goal: Ability to remain free from injury will improve Outcome: Progressing   

## 2022-04-14 NOTE — TOC CAGE-AID Note (Signed)
Transition of Care (TOC) - CAGE-AID Screening ? ? ?Patient Details  ?Name: Philip Humphrey ?MRN: [id number] ?Date of Birth: February 04, 1961 ? ?Transition of Care (TOC) CM/SW Contact:    ?Pollie Friar, RN ?Phone Number: ?04/14/2022, 11:20 AM ? ? ?Clinical Narrative: ?Patient states he knows he needs to stop drinking. He was very accepting of the resources.  ? ? ?CAGE-AID Screening: ?  ? ?Have You Ever Felt You Ought to Cut Down on Your Drinking or Drug Use?: Yes ?Have People Annoyed You By Critizing Your Drinking Or Drug Use?: No ?Have You Felt Bad Or Guilty About Your Drinking Or Drug Use?: Yes ?Have You Ever Had a Drink or Used Drugs First Thing In The Morning to Steady Your Nerves or to Get Rid of a Hangover?: No ?CAGE-AID Score: 2 ? ?Substance Abuse Education Offered: Yes (inpatient/ outpatient alcohol counseling) ? ?  ? ? ? ? ? ? ?

## 2022-04-14 NOTE — Discharge Instructions (Signed)
Follow with Primary MD Simona Huh, NP in 7 days  ? ?Get CBC, CMP, Magnesium, phosphorous, 2 view Chest X ray -  checked next visit within 1 week by Primary MD  ? ?Activity: As tolerated with Full fall precautions use walker/cane & assistance as needed ? ?Disposition Home  ? ?Diet: Heart Healthy   ? ?Special Instructions: If you have smoked or chewed Tobacco  in the last 2 yrs please stop smoking, stop any regular Alcohol  and or any Recreational drug use. ? ?On your next visit with your primary care physician please Get Medicines reviewed and adjusted. ? ?Please request your Prim.MD to go over all Hospital Tests and Procedure/Radiological results at the follow up, please get all Hospital records sent to your Prim MD by signing hospital release before you go home. ? ?If you experience worsening of your admission symptoms, develop shortness of breath, life threatening emergency, suicidal or homicidal thoughts you must seek medical attention immediately by calling 911 or calling your MD immediately  if symptoms less severe. ? ?You Must read complete instructions/literature along with all the possible adverse reactions/side effects for all the Medicines you take and that have been prescribed to you. Take any new Medicines after you have completely understood and accpet all the possible adverse reactions/side effects.  ? ?Do not drive if you are consuming alcohol. ? ?

## 2022-04-14 NOTE — Discharge Summary (Signed)
?                                                                                ? Philip Humphrey KGU:[id number] DOB: [birth year] DOA: 04/12/2022 ? ?PCP: Simona Huh, NP ? ?Admit date: 04/12/2022  Discharge date: 04/14/2022 ? ?Admitted From: Home   Disposition:  Home ? ? ?Recommendations for Outpatient Follow-up:  ? ?Follow up with PCP in 1-2 weeks ? ?PCP Please obtain BMP/CBC, 2 view CXR in 1week,  (see Discharge instructions)  ? ?PCP Please follow up on the following pending results: monitor CBC, CMP, magnesium, phosphorus and two-view chest x-ray in 7 to 10 days.  Please review home medications.  He is not taking half of his home medications as prescribed.  Needs outpatient GI evaluation for possible underlying alcohol induced liver injury. ? ? ?Home Health: Pt, OT if qualifies   ?Equipment/Devices: as below  ?Consultations: None  ?Discharge Condition: Stable    ?CODE STATUS: Full    ?Diet Recommendation: Heart Healthy  ? ?Diet Order   ? ?       ?  Diet - low sodium heart healthy       ?  ?  Diet Heart Room service appropriate? Yes; Fluid consistency: Thin  Diet effective now       ?  ? ?  ?  ? ?  ?  ? ?Chief Complaint  ?Patient presents with  ? Fall  ? Loss of Consciousness  ?  ? ?Brief history of present illness from the day of admission and additional interim summary   ? ?61 y.o. male with medical history significant for hemochromatosis, last phlebotomy in August 2022, B12 deficiency, essential hypertension, hypothyroidism, alcohol abuse, history of medical noncompliance with leaving [facility], who presented to [facility] ED after an unwitnessed fall at home, he says he did not lose consciousness but his lungs got weak, did not lose bowel or bladder control, no tongue bites or witnessed seizure-like like activity.  He does drink a lot of alcohol and upon ER arrival his work-up was consistent with dehydration,  AKI, alcohol level was 145 and he was admitted to the hospital. ? ?                                                               Hospital Course  ? ?1.  Alcohol intoxication with dehydration causing mechanical fall.  Alcohol level was 145 upon admission, admits to drinking plenty of alcohol daily, was hydrated, seen by PT OT, no signs of DTs.  Now at baseline and eager to be discharged home, counseled to quit alcohol will be discharged home with close a PCP follow-up in a week. ?  ?2.  AKI .  Due to dehydration, resolved after IV fluids. ?  ?3.  Dehydration induced lactic acidosis.  No signs of sepsis.  Dehydrated. ?  ?4.  Asymptomatic transaminitis consistent with alcohol abuse, counseled to quit.  Outpatient PCP monitoring ?  ?  5. Hypophosphatemia and hypomagnesemia.  Replaced.  PCP to recheck in 7 to 10 days ?  ?6.  Chronic thrombocytopenia.  Consistent with alcohol abuse, outpatient liver function monitoring and platelet monitoring.  Outpatient GI evaluation for alcohol induced liver injury.  PCP to arrange and monitor. ?  ?7.  Hypothyroidism.  On Synthroid. ?  ?8.  Dyslipidemia.  On statin. ? ?Lab Results  ?Component Value Date  ? PLT 52 (L) 04/14/2022  ? ? ?Discharge diagnosis   ? ? ?Principal Problem: ?  Fall ?Active Problems: ?  AKI (acute kidney injury) ([place]) ?  Hypothyroidism ?  Alcohol abuse ?  Thrombocytopenia ([place]) ? ? ? ?Discharge instructions   ? ?Discharge Instructions   ? ? Diet - low sodium heart healthy   Complete by: As directed ?  ? Discharge instructions   Complete by: As directed ?  ? Follow with Primary MD Simona Huh, NP in 7 days  ? ?Get CBC, CMP, Magnesium, phosphorous, 2 view Chest X ray -  checked next visit within 1 week by Primary MD  ? ?Activity: As tolerated with Full fall precautions use walker/cane & assistance as needed ? ?Disposition Home  ? ?Diet: Heart Healthy   ? ?Special Instructions: If you have smoked or chewed Tobacco  in the last 2 yrs please stop smoking, stop any  regular Alcohol  and or any Recreational drug use. ? ?On your next visit with your primary care physician please Get Medicines reviewed and adjusted. ? ?Please request your Prim.MD to go over all Hospital Tests and Procedure/Radiological results at the follow up, please get all Hospital records sent to your Prim MD by signing hospital release before you go home. ? ?If you experience worsening of your admission symptoms, develop shortness of breath, life threatening emergency, suicidal or homicidal thoughts you must seek medical attention immediately by calling 911 or calling your MD immediately  if symptoms less severe. ? ?You Must read complete instructions/literature along with all the possible adverse reactions/side effects for all the Medicines you take and that have been prescribed to you. Take any new Medicines after you have completely understood and accpet all the possible adverse reactions/side effects.  ? ?Do not drive if you are consuming alcohol.  ? Increase activity slowly   Complete by: As directed ?  ? ?  ? ? ?Discharge Medications  ? ?Allergies as of 04/14/2022   ? ?   Reactions  ? Sulfa Antibiotics Anaphylaxis, Swelling  ? Mouth and throat swelling  ? Oxycodone Itching  ? ?  ? ?  ?Medication List  ?  ? ?STOP taking these medications   ? ?acamprosate 333 MG tablet ?Commonly known as: CAMPRAL ?  ?albuterol 108 (90 Base) MCG/ACT inhaler ?Commonly known as: VENTOLIN HFA ?  ?amoxicillin 500 MG capsule ?Commonly known as: AMOXIL ?  ?desloratadine 5 MG tablet ?Commonly known as: CLARINEX ?  ?docusate sodium 100 MG capsule ?Commonly known as: COLACE ?  ?doxepin 10 MG capsule ?Commonly known as: SINEQUAN ?  ?hydrochlorothiazide 50 MG tablet ?Commonly known as: HYDRODIURIL ?  ?meclizine 12.5 MG tablet ?Commonly known as: ANTIVERT ?  ?montelukast 10 MG tablet ?Commonly known as: SINGULAIR ?  ?ondansetron 8 MG disintegrating tablet ?Commonly known as: ZOFRAN-ODT ?  ? ?  ? ?TAKE these medications   ? ?aspirin EC  81 MG tablet ?Take 1 tablet (81 mg total) by mouth daily. Swallow whole. ?  ?atorvastatin 80 MG tablet ?Commonly known as: LIPITOR ?Take 80 mg by  mouth daily. ?  ?bismuth subsalicylate 086 PY/19JK suspension ?Commonly known as: PEPTO BISMOL ?Take 30 mLs by mouth every 6 (six) hours as needed for indigestion (stomach pain). ?  ?calcium carbonate 500 MG chewable tablet ?Commonly known as: TUMS - dosed in mg elemental calcium ?Chew 2 tablets by mouth at bedtime as needed for indigestion or heartburn. ?  ?cyanocobalamin 1000 MCG/ML injection ?Commonly known as: (VITAMIN B-12) ?Inject 1,000 mcg into the muscle once a week. ?  ?diclofenac Sodium 1 % Gel ?Commonly known as: VOLTAREN ?Apply 1 application topically 2 (two) times daily as needed (pain). ?  ?diphenhydramine-acetaminophen 25-500 MG Tabs tablet ?Commonly known as: TYLENOL PM ?Take 1 tablet by mouth at bedtime as needed (sleep). ?  ?famotidine 40 MG tablet ?Commonly known as: PEPCID ?Take 40 mg by mouth daily as needed for heartburn or indigestion. ?  ?folic acid 1 MG tablet ?Commonly known as: FOLVITE ?Take 1 tablet (1 mg total) by mouth daily. ?  ?gabapentin 300 MG capsule ?Commonly known as: NEURONTIN ?Take 300-600 mg by mouth See admin instructions. Take 1 capsule by mouth (300 mg) in the morning & 2 capsules by mouth  (600 mg) at night. ?  ?hydrOXYzine 25 MG tablet ?Commonly known as: ATARAX ?Take 25 mg by mouth every 4 (four) hours as needed. For itching ?  ?lisinopril 20 MG tablet ?Commonly known as: ZESTRIL ?Take 1 tablet (20 mg total) by mouth daily. ?  ?Magnesium 400 MG Tabs ?Take 400 mg by mouth daily. ?  ?MELATONIN PO ?Take 1 tablet by mouth at bedtime. ?  ?nitroGLYCERIN 0.4 MG SL tablet ?Commonly known as: NITROSTAT ?Place 0.4 mg under the tongue every 5 (five) minutes as needed for chest pain. ?  ?PATADAY OP ?Place 1 drop into both eyes 2 (two) times daily as needed (itching/allergies). ?  ?Synthroid 175 MCG tablet ?Generic drug: levothyroxine ?Take  1 tablet (175 mcg total) by mouth daily. NO MORE REFILLS WITHOUT OFFICE VISIT - 2ND NOTICE ?What changed: additional instructions ?  ?thiamine 100 MG tablet ?Take 1 tablet (100 mg total) by mouth daily. ?

## 2022-04-14 NOTE — Progress Notes (Signed)
[facility] night cross cover note: ? ?I was notified by RN with patient's request for additional pain med for back pain, with patient reporting no improvement following prn dose of acetaminophen. I ordered Dilaudid 0.5 mg IV x1 dose. ? ? ? ? ?Babs Bertin, DO ?Hospitalist ? ?

## 2022-04-14 NOTE — Plan of Care (Signed)
GOALS MET ?

## 2022-04-15 ENCOUNTER — Telehealth: Payer: Self-pay | Admitting: *Deleted

## 2022-04-15 ENCOUNTER — Other Ambulatory Visit: Payer: Self-pay | Admitting: *Deleted

## 2022-04-15 DIAGNOSIS — N179 Acute kidney failure, unspecified: Secondary | ICD-10-CM

## 2022-04-15 NOTE — Telephone Encounter (Signed)
This RN spoke with pt per his call to scheduling requesting an appt with MD and phlebotomy. ? ?Philip Humphrey states he was admitted over the weekend " and almost died because my bp dropped to 80/50 and they had to give me lots and lots of fluid " ? ?He states he was told " that the number one thing that brought me to the hospital was hemechromatosis " ? ?This RN inquired further per review of d/c dictation not stating above - [name] stating this was said with a prior ER visit. ? ?" She told me my blood isn't like other people and I need to maintain having phlebotomies." ? ?This  RN informed pt of lab work done this past weekend showing heme of 9.5- which a phlebotomy would decrease. ? ?Plan per above discussion is pt will come in for lab on 5/1 and visit on 5/2 (so lab results are available)  ?Appointments made by this RN with pt - and of note pt stated he has several other appts he has to keep including a visit tomorrow with a substance abuse provider. ? ?Appointments made for this office that would not interfere with current appointments. ? ?Pt has order for CBC - additional iron study orders entered. ? ?

## 2022-04-20 ENCOUNTER — Inpatient Hospital Stay: Payer: 59 | Attending: Hematology and Oncology

## 2022-04-20 ENCOUNTER — Other Ambulatory Visit: Payer: Self-pay

## 2022-04-20 DIAGNOSIS — F419 Anxiety disorder, unspecified: Secondary | ICD-10-CM | POA: Insufficient documentation

## 2022-04-20 DIAGNOSIS — E538 Deficiency of other specified B group vitamins: Secondary | ICD-10-CM | POA: Diagnosis present

## 2022-04-20 DIAGNOSIS — E079 Disorder of thyroid, unspecified: Secondary | ICD-10-CM | POA: Insufficient documentation

## 2022-04-20 DIAGNOSIS — I10 Essential (primary) hypertension: Secondary | ICD-10-CM | POA: Insufficient documentation

## 2022-04-20 DIAGNOSIS — N179 Acute kidney failure, unspecified: Secondary | ICD-10-CM

## 2022-04-20 DIAGNOSIS — Z7982 Long term (current) use of aspirin: Secondary | ICD-10-CM | POA: Insufficient documentation

## 2022-04-20 DIAGNOSIS — Z79899 Other long term (current) drug therapy: Secondary | ICD-10-CM | POA: Diagnosis not present

## 2022-04-20 DIAGNOSIS — Z87891 Personal history of nicotine dependence: Secondary | ICD-10-CM | POA: Diagnosis not present

## 2022-04-20 LAB — CBC WITH DIFFERENTIAL/PLATELET
Abs Immature Granulocytes: 0.02 10*3/uL (ref 0.00–0.07)
Basophils Absolute: 0 10*3/uL (ref 0.0–0.1)
Basophils Relative: 0 %
Eosinophils Absolute: 0.2 10*3/uL (ref 0.0–0.5)
Eosinophils Relative: 5 %
HCT: 30.3 % — ABNORMAL LOW (ref 39.0–52.0)
Hemoglobin: 10.1 g/dL — ABNORMAL LOW (ref 13.0–17.0)
Immature Granulocytes: 0 %
Lymphocytes Relative: 18 %
Lymphs Abs: 0.9 10*3/uL (ref 0.7–4.0)
MCH: 35.9 pg — ABNORMAL HIGH (ref 26.0–34.0)
MCHC: 33.3 g/dL (ref 30.0–36.0)
MCV: 107.8 fL — ABNORMAL HIGH (ref 80.0–100.0)
Monocytes Absolute: 0.9 10*3/uL (ref 0.1–1.0)
Monocytes Relative: 19 %
Neutro Abs: 2.8 10*3/uL (ref 1.7–7.7)
Neutrophils Relative %: 58 %
Platelets: 123 10*3/uL — ABNORMAL LOW (ref 150–400)
RBC: 2.81 MIL/uL — ABNORMAL LOW (ref 4.22–5.81)
RDW: 14.6 % (ref 11.5–15.5)
WBC: 4.9 10*3/uL (ref 4.0–10.5)
nRBC: 0 % (ref 0.0–0.2)

## 2022-04-20 LAB — CMP (CANCER CENTER ONLY)
ALT: 66 U/L — ABNORMAL HIGH (ref 0–44)
AST: 58 U/L — ABNORMAL HIGH (ref 15–41)
Albumin: 3.6 g/dL (ref 3.5–5.0)
Alkaline Phosphatase: 40 U/L (ref 38–126)
Anion gap: 4 — ABNORMAL LOW (ref 5–15)
BUN: 12 mg/dL (ref 8–23)
CO2: 31 mmol/L (ref 22–32)
Calcium: 9.2 mg/dL (ref 8.9–10.3)
Chloride: 105 mmol/L (ref 98–111)
Creatinine: 1.39 mg/dL — ABNORMAL HIGH (ref 0.61–1.24)
GFR, Estimated: 58 mL/min — ABNORMAL LOW (ref >60)
Glucose, Bld: 97 mg/dL (ref 70–99)
Potassium: 4.6 mmol/L (ref 3.5–5.1)
Sodium: 140 mmol/L (ref 135–145)
Total Bilirubin: 0.3 mg/dL (ref 0.3–1.2)
Total Protein: 5.9 g/dL — ABNORMAL LOW (ref 6.5–8.1)

## 2022-04-20 LAB — FERRITIN: Ferritin: 144 ng/mL (ref 24–336)

## 2022-04-20 LAB — VITAMIN B12: Vitamin B-12: 194 pg/mL (ref 180–914)

## 2022-04-21 ENCOUNTER — Inpatient Hospital Stay (HOSPITAL_BASED_OUTPATIENT_CLINIC_OR_DEPARTMENT_OTHER): Payer: 59 | Admitting: Hematology and Oncology

## 2022-04-21 ENCOUNTER — Encounter: Payer: Self-pay | Admitting: Hematology and Oncology

## 2022-04-21 NOTE — Progress Notes (Signed)
[name] ?CONSULT NOTE ? ?Patient Care Team: ?Simona Huh, NP as PCP - General (Nurse Practitioner) ? ?CHIEF COMPLAINTS/PURPOSE OF CONSULTATION:  ?Follow-up about iron overload disorder ? ?ASSESSMENT & PLAN:  ? ?This is a pleasant 61 year old male patient with past medical history significant for hypertension, musculoskeletal issues, thyroid disease referred to hematology for evaluation of possible iron overload disorder. ? ?#1.  Iron overload disorder his last labs in June showed a ferritin of 177.   ?He had last phlebotomy in April 2023, ?Since his last visit, he was seen back in the ER with alcohol intoxication, fall. ?I discussed with him today that his hemoglobin is lower than what we would ideally need to perform phlebotomy.   ?Ferritin today is 144, not overtly high to cause any immediate problems.  Have clearly explained this to the patient.  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  At this level of ferritin, he should not have any iron related overload issues.  I also explained to him that although hemochromatosis needs to be monitored and treated intermittently, this very rarely on its own will lead to immediate life-threatening complications. ?He should return to clinic in 4 weeks and hopefully if his hemoglobin improves, we will proceed with phlebotomy at that point. ?Once again encouraged him to continue alcohol abstinence and follow-up with the substance abuse program. ? ?2.  B12 deficiency, normal today.  Encouraged him to continue oral B12 or B12 injections.  He expressed understanding. ? ?3.  Counseled him to stop alcohol ingestion.  He has not had a drink in about 10 days he says.  He will continue to follow-up with substance abuse program.  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 ? ?With regards to hypertension and leg swelling, I have encouraged him to talk to his  PCP. ?HISTORY OF PRESENTING ILLNESS:  ? ?Philip Humphrey 61 y.o. 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 ? ?This is a very pleasant 61 year old male patient with past medical history significant for hypertension, musculoskeletal issues, peripheral neuropathy who was referred to hematology for evaluation of possible iron overload disorder.  ? ?Interval History ? ?He is here for FU by himself.  He was in the ER twice since his last visit, has been drinking excessively and was found to have high amounts of alcohol on both visits, resulting acute kidney injury.  He had his last phlebotomy early April and he tolerated it well. ?He denies any new health complaints.  He states he is very motivated to quit, has not had a drink in about 10 days. ?No change in breathing or bowel habits.  He continues to report issues with leg swelling.  He has a cardiology visit upcoming. ?Rest of the pertinent 10 point ROS reviewed and negative. ? ?MEDICAL HISTORY:  ?Past Medical History:  ?Diagnosis Date  ? Allergy   ? occ. seasonal  ? Anxiety   ? Arthritis   ? Colon polyp   ? Condyloma   ? anal  ? Depression   ? GERD (gastroesophageal reflux disease)   ? History of transfusion   ? as child  ? Hypertension   ? Knee injury   ? right knee cap w piece broken off- MRI done 02-06-15-pending surgery. 11-18-16 right knee is still painful, both shoulders(limited ROM right).  ? Knee pain   ? 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 Palpitations   ?  normal stress test 10 yrs ago  ? Pneumonia   ? Thyroid disease   ? Thyroid removed unintentionally at age 49- no further thyroid tissue remains-uses daily supplement  ? ? ?SURGICAL HISTORY: ?Past Surgical History:  ?Procedure Laterality Date  ? COLONOSCOPY    ? COLONOSCOPY N/A 02/15/2015  ? Procedure: COLONOSCOPY;  Surgeon: Alphonsa Overall, MD;  Location: [facility];  Service: General;  Laterality: N/A;  ? cyst removed    ? from neck / abdomed  ? EXAMINATION UNDER ANESTHESIA N/A 02/15/2015  ? Procedure: EXAM  UNDER ANESTHESIA;  Surgeon: Pedro Earls, MD;  Location: [facility];  Service: General;  Laterality: N/A;  ? HERNIA REPAIR Left   ? 10 yrs ago  ? KNEE ARTHROSCOPY Left   ? x1 and meniscal tear   ? ROTATOR CUFF REPAIR Right   ? x1  ? SHOULDER ARTHROSCOPY W/ ROTATOR CUFF REPAIR Left   ? THYROIDECTOMY  age 1   ? TOTAL HIP ARTHROPLASTY Left 11/24/2016  ? Procedure: LEFT TOTAL HIP ARTHROPLASTY ANTERIOR APPROACH;  Surgeon: Paralee Cancel, MD;  Location: [facility];  Service: Orthopedics;  Laterality: Left;  ? TOTAL HIP ARTHROPLASTY    ? Right hip  Dr. Alvan Dame 08/03/17  ? TOTAL HIP ARTHROPLASTY Right 08/03/2017  ? Procedure: RIGHT TOTAL HIP ARTHROPLASTY ANTERIOR APPROACH;  Surgeon: Paralee Cancel, MD;  Location: [facility];  Service: Orthopedics;  Laterality: Right;  70 mins  ? UMBILICAL HERNIA REPAIR N/A 10/18/2017  ? Procedure: Umbilical hernia repair  and excision of lipomas;  Surgeon: Johnathan Hausen, MD;  Location: [facility];  Service: General;  Laterality: N/A;  ? WART FULGURATION N/A 02/15/2015  ? Procedure: REMOVAL OF ANAL TAGS,CONDYLOMA;  Surgeon: Pedro Earls, MD;  Location: [facility];  Service: General;  Laterality: N/A;  ? WRIST SURGERY    ? x2- left (repair tendon/ ligament)  ? ? ?SOCIAL HISTORY: ?Social History  ? ?Socioeconomic History  ? Marital status: Married  ?  Spouse name: Not on file  ? Number of children: Not on file  ? Years of education: Not on file  ? Highest education level: Not on file  ?Occupational History  ? Not on file  ?Tobacco Use  ? Smoking status: Former  ?  Types: Cigars  ? Smokeless tobacco: Never  ? Tobacco comments:  ?  occasional quit 2012  ?Vaping Use  ? Vaping Use: Never used  ?Substance and Sexual Activity  ? Alcohol use: Yes  ?  Comment: drinking liquor each night recently  ? Drug use: No  ? Sexual activity: Yes  ?Other Topics Concern  ? Not on file  ?Social History Narrative  ? Not on file  ? ?Social Determinants of Health  ? ?Financial Resource Strain: Not on file  ?Food Insecurity: Not on file   ?Transportation Needs: Not on file  ?Physical Activity: Not on file  ?Stress: Not on file  ?Social Connections: Not on file  ?Intimate Partner Violence: Not on file  ? ? ?FAMILY HISTORY: ?Family History  ?Problem Relation Age of Onset  ? Heart disease Mother   ?     ?   ? Lung disease Brother   ? Stroke Father   ? ? ?ALLERGIES:  is allergic to sulfa antibiotics and oxycodone. ? ?MEDICATIONS:  ?Current Outpatient Medications  ?Medication Sig Dispense Refill  ? aspirin EC 81 MG tablet Take 1 tablet (81 mg total) by mouth daily. Swallow whole. (Patient not taking: Reported on 03/03/2022) 365 tablet 0  ?  atorvastatin (LIPITOR) 80 MG tablet Take 80 mg by mouth daily.    ? bismuth subsalicylate (PEPTO BISMOL) 262 MG/15ML suspension Take 30 mLs by mouth every 6 (six) hours as needed for indigestion (stomach pain).    ? calcium carbonate (TUMS - DOSED IN MG ELEMENTAL CALCIUM) 500 MG chewable tablet Chew 2 tablets by mouth at bedtime as needed for indigestion or heartburn.    ? cyanocobalamin (,VITAMIN B-12,) 1000 MCG/ML injection Inject 1,000 mcg into the muscle once a week. (Patient not taking: Reported on 04/12/2022)    ? diclofenac Sodium (VOLTAREN) 1 % GEL Apply 1 application topically 2 (two) times daily as needed (pain).    ? diphenhydramine-acetaminophen (TYLENOL PM) 25-500 MG TABS tablet Take 1 tablet by mouth at bedtime as needed (sleep).    ? famotidine (PEPCID) 40 MG tablet Take 40 mg by mouth daily as needed for heartburn or indigestion.    ? folic acid (FOLVITE) 1 MG tablet Take 1 tablet (1 mg total) by mouth daily. 30 tablet 0  ? gabapentin (NEURONTIN) 300 MG capsule Take 300-600 mg by mouth See admin instructions. Take 1 capsule by mouth (300 mg) in the morning & 2 capsules by mouth  (600 mg) at night.    ? hydrOXYzine (ATARAX/VISTARIL) 25 MG tablet Take 25 mg by mouth every 4 (four) hours as needed. For itching    ? lisinopril (ZESTRIL) 20 MG tablet Take 1 tablet (20 mg total) by mouth daily.    ? Magnesium  400 MG TABS Take 400 mg by mouth daily.    ? MELATONIN PO Take 1 tablet by mouth at bedtime.    ? nitroGLYCERIN (NITROSTAT) 0.4 MG SL tablet Place 0.4 mg under the tongue every 5 (five) minutes as needed for

## 2022-05-21 ENCOUNTER — Inpatient Hospital Stay (HOSPITAL_BASED_OUTPATIENT_CLINIC_OR_DEPARTMENT_OTHER): Payer: 59 | Admitting: Hematology and Oncology

## 2022-05-21 ENCOUNTER — Other Ambulatory Visit: Payer: Self-pay

## 2022-05-21 ENCOUNTER — Inpatient Hospital Stay: Payer: 59 | Attending: Hematology and Oncology

## 2022-05-21 ENCOUNTER — Inpatient Hospital Stay: Payer: 59

## 2022-05-21 ENCOUNTER — Encounter: Payer: Self-pay | Admitting: Hematology and Oncology

## 2022-05-21 VITALS — BP 136/93 | HR 86 | Temp 97.7°F | Wt 201.6 lb

## 2022-05-21 DIAGNOSIS — E538 Deficiency of other specified B group vitamins: Secondary | ICD-10-CM

## 2022-05-21 DIAGNOSIS — Z79899 Other long term (current) drug therapy: Secondary | ICD-10-CM | POA: Insufficient documentation

## 2022-05-21 LAB — CBC WITH DIFFERENTIAL/PLATELET
Abs Immature Granulocytes: 0.02 10*3/uL (ref 0.00–0.07)
Basophils Absolute: 0 10*3/uL (ref 0.0–0.1)
Basophils Relative: 0 %
Eosinophils Absolute: 0.2 10*3/uL (ref 0.0–0.5)
Eosinophils Relative: 2 %
HCT: 34.3 % — ABNORMAL LOW (ref 39.0–52.0)
Hemoglobin: 11.8 g/dL — ABNORMAL LOW (ref 13.0–17.0)
Immature Granulocytes: 0 %
Lymphocytes Relative: 19 %
Lymphs Abs: 1.3 10*3/uL (ref 0.7–4.0)
MCH: 36.2 pg — ABNORMAL HIGH (ref 26.0–34.0)
MCHC: 34.4 g/dL (ref 30.0–36.0)
MCV: 105.2 fL — ABNORMAL HIGH (ref 80.0–100.0)
Monocytes Absolute: 0.5 10*3/uL (ref 0.1–1.0)
Monocytes Relative: 8 %
Neutro Abs: 4.7 10*3/uL (ref 1.7–7.7)
Neutrophils Relative %: 71 %
Platelets: 99 10*3/uL — ABNORMAL LOW (ref 150–400)
RBC: 3.26 MIL/uL — ABNORMAL LOW (ref 4.22–5.81)
RDW: 13.2 % (ref 11.5–15.5)
WBC: 6.7 10*3/uL (ref 4.0–10.5)
nRBC: 0 % (ref 0.0–0.2)

## 2022-05-21 LAB — COMPREHENSIVE METABOLIC PANEL
ALT: 22 U/L (ref 0–44)
AST: 27 U/L (ref 15–41)
Albumin: 4 g/dL (ref 3.5–5.0)
Alkaline Phosphatase: 37 U/L — ABNORMAL LOW (ref 38–126)
Anion gap: 5 (ref 5–15)
BUN: 12 mg/dL (ref 8–23)
CO2: 30 mmol/L (ref 22–32)
Calcium: 9.4 mg/dL (ref 8.9–10.3)
Chloride: 106 mmol/L (ref 98–111)
Creatinine, Ser: 0.87 mg/dL (ref 0.61–1.24)
GFR, Estimated: >60 mL/min (ref >60)
Glucose, Bld: 98 mg/dL (ref 70–99)
Potassium: 4.3 mmol/L (ref 3.5–5.1)
Sodium: 141 mmol/L (ref 135–145)
Total Bilirubin: 0.4 mg/dL (ref 0.3–1.2)
Total Protein: 6.2 g/dL — ABNORMAL LOW (ref 6.5–8.1)

## 2022-05-21 NOTE — Progress Notes (Signed)
Philip Humphrey presents today for phlebotomy per MD orders. Phlebotomy procedure started at 1635 and ended at 1647 using phlebotomy kit in Left AC 502 grams removed. Patient observed for 30 minutes after procedure without any incident. Patient offered snacks and drink. Patient tolerated procedure well. IV needle removed intact.

## 2022-05-21 NOTE — Patient Instructions (Signed)

## 2022-05-21 NOTE — Progress Notes (Signed)
Per MD, OK to proceed today with therapeutic phlebotomy despite parameters. Jethro Bolus, RN aware.

## 2022-05-21 NOTE — Progress Notes (Signed)
Ok to proceed with phlebotomy today per Dr. Chryl Heck with HCT of 34.

## 2022-05-21 NOTE — Progress Notes (Signed)
[facility] CONSULT NOTE  Patient Care Team: Simona Huh, NP as PCP - General (Nurse Practitioner)  CHIEF COMPLAINTS/PURPOSE OF CONSULTATION:  Follow-up about iron overload disorder  ASSESSMENT & PLAN:   This is a pleasant 61 year old male patient with past medical history significant for hypertension, musculoskeletal issues, thyroid disease referred to hematology for evaluation of possible iron overload disorder.  #1.  Iron overload disorder his last labs in June showed a ferritin of 177.   He had last phlebotomy in April 2023, He complains of feeling tired and irritable and the need for phlebotomy since he did not get it in May. He has completely quit alcohol since his last visit.  He is trying to stay busy in the house. CBC from today shows hemoglobin of 11.8 and hematocrit of 34.3. We will proceed with phlebotomy today since hematocrit is almost 36 and he complains of the need for phlebotomy. He will return to clinic in 8 weeks with repeat labs CBC, CMP, ferritin, B12  2.  B12 deficiency, last levels were normal, encouraged to continue B12 supplementation.   HISTORY OF PRESENTING ILLNESS:   Philip Humphrey 61 y.o. 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  This is a very pleasant 61 year old male patient with past medical history significant for hypertension, musculoskeletal issues, peripheral neuropathy who was referred to hematology for evaluation of possible iron overload disorder.   Interval History  He is here for FU by himself.  He tells me that he quit alcohol cold [place] since the last admission.  He has been trying to stay busy in the house.  He however feels that he may need a phlebotomy.  He complains of some fatigue and the irritability and he feels very well after phlebotomy.  He denies any new shortness of breath.  No bleeding complaints.  He is eating well.  Rest of the pertinent 10 point ROS reviewed and negative.  MEDICAL HISTORY:  Past  Medical History:  Diagnosis Date   Allergy    occ. seasonal   Anxiety    Arthritis    Colon polyp    Condyloma    anal   Depression    GERD (gastroesophageal reflux disease)    History of transfusion    as child   Hypertension    Knee injury    right knee cap w piece broken off- MRI done 02-06-15-pending surgery. 11-18-16 right knee is still painful, both shoulders(limited ROM right).   Knee pain    Neuropathy    bilateral- greater left   Palpitations    normal stress test 10 yrs ago   Pneumonia    Thyroid disease    Thyroid removed unintentionally at age 81- no further thyroid tissue remains-uses daily supplement    SURGICAL HISTORY: Past Surgical History:  Procedure Laterality Date   COLONOSCOPY     COLONOSCOPY N/A 02/15/2015   Procedure: COLONOSCOPY;  Surgeon: Alphonsa Overall, MD;  Location: [facility];  Service: General;  Laterality: N/A;   cyst removed     from neck / abdomed   EXAMINATION UNDER ANESTHESIA N/A 02/15/2015   Procedure: EXAM UNDER ANESTHESIA;  Surgeon: Pedro Earls, MD;  Location: [facility];  Service: General;  Laterality: N/A;   HERNIA REPAIR Left    10 yrs ago   KNEE ARTHROSCOPY Left    x1 and meniscal tear    ROTATOR CUFF REPAIR Right    x1   SHOULDER ARTHROSCOPY W/ ROTATOR CUFF REPAIR Left    THYROIDECTOMY  age 67    TOTAL HIP ARTHROPLASTY Left 11/24/2016   Procedure: LEFT TOTAL HIP ARTHROPLASTY ANTERIOR APPROACH;  Surgeon: Paralee Cancel, MD;  Location: [facility];  Service: Orthopedics;  Laterality: Left;   TOTAL HIP ARTHROPLASTY     Right hip  Dr. Alvan Dame 08/03/17   TOTAL HIP ARTHROPLASTY Right 08/03/2017   Procedure: RIGHT TOTAL HIP ARTHROPLASTY ANTERIOR APPROACH;  Surgeon: Paralee Cancel, MD;  Location: [facility];  Service: Orthopedics;  Laterality: Right;  70 mins   UMBILICAL HERNIA REPAIR N/A 10/18/2017   Procedure: Umbilical hernia repair  and excision of lipomas;  Surgeon: Johnathan Hausen, MD;  Location: [facility];  Service: General;  Laterality: N/A;   WART  FULGURATION N/A 02/15/2015   Procedure: REMOVAL OF ANAL TAGS,CONDYLOMA;  Surgeon: Pedro Earls, MD;  Location: [facility];  Service: General;  Laterality: N/A;   WRIST SURGERY     x2- left (repair tendon/ ligament)    SOCIAL HISTORY: Social History   Socioeconomic History   Marital status: Married    Spouse name: Not on file   Number of children: Not on file   Years of education: Not on file   Highest education level: Not on file  Occupational History   Not on file  Tobacco Use   Smoking status: Former    Types: Cigars   Smokeless tobacco: Never   Tobacco comments:    occasional quit 2012  Vaping Use   Vaping Use: Never used  Substance and Sexual Activity   Alcohol use: Yes    Comment: drinking liquor each night recently   Drug use: No   Sexual activity: Yes  Other Topics Concern   Not on file  Social History Narrative   Not on file   Social Determinants of Health   Financial Resource Strain: Not on file  Food Insecurity: Not on file  Transportation Needs: Not on file  Physical Activity: Not on file  Stress: Not on file  Social Connections: Not on file  Intimate Partner Violence: Not on file    FAMILY HISTORY: Family History  Problem Relation Age of Onset   Heart disease Mother            Lung disease Brother    Stroke Father     ALLERGIES:  is allergic to sulfa antibiotics and oxycodone.  MEDICATIONS:  Current Outpatient Medications  Medication Sig Dispense Refill   aspirin EC 81 MG tablet Take 1 tablet (81 mg total) by mouth daily. Swallow whole. (Patient not taking: Reported on 03/03/2022) 365 tablet 0   atorvastatin (LIPITOR) 80 MG tablet Take 80 mg by mouth daily.     bismuth subsalicylate (PEPTO BISMOL) 262 MG/15ML suspension Take 30 mLs by mouth every 6 (six) hours as needed for indigestion (stomach pain).     calcium carbonate (TUMS - DOSED IN MG ELEMENTAL CALCIUM) 500 MG chewable tablet Chew 2 tablets by mouth at bedtime as needed for indigestion  or heartburn.     cyanocobalamin (,VITAMIN B-12,) 1000 MCG/ML injection Inject 1,000 mcg into the muscle once a week. (Patient not taking: Reported on 04/12/2022)     diclofenac Sodium (VOLTAREN) 1 % GEL Apply 1 application topically 2 (two) times daily as needed (pain).     diphenhydramine-acetaminophen (TYLENOL PM) 25-500 MG TABS tablet Take 1 tablet by mouth at bedtime as needed (sleep).     famotidine (PEPCID) 40 MG tablet Take 40 mg by mouth daily as needed for heartburn or indigestion.     folic  acid (FOLVITE) 1 MG tablet Take 1 tablet (1 mg total) by mouth daily. 30 tablet 0   gabapentin (NEURONTIN) 300 MG capsule Take 300-600 mg by mouth See admin instructions. Take 1 capsule by mouth (300 mg) in the morning & 2 capsules by mouth  (600 mg) at night.     hydrOXYzine (ATARAX/VISTARIL) 25 MG tablet Take 25 mg by mouth every 4 (four) hours as needed. For itching     lisinopril (ZESTRIL) 20 MG tablet Take 1 tablet (20 mg total) by mouth daily.     Magnesium 400 MG TABS Take 400 mg by mouth daily.     MELATONIN PO Take 1 tablet by mouth at bedtime.     nitroGLYCERIN (NITROSTAT) 0.4 MG SL tablet Place 0.4 mg under the tongue every 5 (five) minutes as needed for chest pain.     Olopatadine HCl (PATADAY OP) Place 1 drop into both eyes 2 (two) times daily as needed (itching/allergies).     SYNTHROID 175 MCG tablet Take 1 tablet (175 mcg total) by mouth daily. NO MORE REFILLS WITHOUT OFFICE VISIT - 2ND NOTICE (Patient taking differently: Take 175 mcg by mouth daily.) 15 tablet 0   thiamine 100 MG tablet Take 1 tablet (100 mg total) by mouth daily. 30 tablet 0   traMADol (ULTRAM) 50 MG tablet Take 50 mg by mouth every 4 (four) hours as needed for moderate pain.     Vitamin D, Ergocalciferol, (DRISDOL) 1.25 MG (50000 UNIT) CAPS capsule Take 50,000 Units by mouth every 7 (seven) days.     No current facility-administered medications for this visit.    PHYSICAL EXAMINATION: ECOG PERFORMANCE STATUS: 0 -  Asymptomatic  Vitals:   05/21/22 1535  BP: (!) 136/93  Pulse: 86  Temp: 97.7 F (36.5 C)  SpO2: 100%   Filed Weights   05/21/22 1535  Weight: 201 lb 9.6 oz (91.4 kg)    Physical Exam Constitutional:      Appearance: Normal appearance.  Musculoskeletal:        General: Swelling: Bilateral and symmetrical.  Neurological:     Mental Status: He is alert.  Psychiatric:        Mood and Affect: Mood normal.        Thought Content: Thought content normal.    LABORATORY DATA:  I have reviewed the data as listed Lab Results  Component Value Date   WBC 6.7 05/21/2022   HGB 11.8 (L) 05/21/2022   HCT 34.3 (L) 05/21/2022   MCV 105.2 (H) 05/21/2022   PLT 99 (L) 05/21/2022     Chemistry      Component Value Date/Time   NA 140 04/20/2022 1153   K 4.6 04/20/2022 1153   CL 105 04/20/2022 1153   CO2 31 04/20/2022 1153   BUN 12 04/20/2022 1153   CREATININE 1.39 (H) 04/20/2022 1153   CREATININE 1.22 07/13/2013 1253      Component Value Date/Time   CALCIUM 9.2 04/20/2022 1153   ALKPHOS 40 04/20/2022 1153   AST 58 (H) 04/20/2022 1153   ALT 66 (H) 04/20/2022 1153   BILITOT 0.3 04/20/2022 1153     CBC reviewed,  RADIOGRAPHIC STUDIES: I have personally reviewed the radiological images as listed and agreed with the findings in the report. No results found.  All questions were answered. The patient knows to call the clinic with any problems, questions or concerns. I spent 20 minutes in the care of this patient including H and P, review of records, counseling  and coordination of care as well as documentation  Thank you for consulting [place] in the care of this patient.  Please do not hesitate to contact us with any questions or concerns.    Benay Pike, MD 05/21/2022 3:46 PM

## 2022-06-18 ENCOUNTER — Inpatient Hospital Stay: Payer: 59 | Admitting: Hematology and Oncology

## 2022-07-16 ENCOUNTER — Other Ambulatory Visit: Payer: Self-pay | Admitting: Physician Assistant

## 2022-07-16 DIAGNOSIS — E538 Deficiency of other specified B group vitamins: Secondary | ICD-10-CM

## 2022-07-17 ENCOUNTER — Inpatient Hospital Stay: Payer: 59 | Attending: Hematology and Oncology | Admitting: Physician Assistant

## 2022-07-17 ENCOUNTER — Inpatient Hospital Stay: Payer: 59

## 2022-07-21 ENCOUNTER — Telehealth: Payer: Self-pay | Admitting: Physician Assistant

## 2022-07-21 NOTE — Telephone Encounter (Signed)
Per 8/1 phone line pt called to r/s missed appointment   appointment r/s per pt request

## 2022-07-24 ENCOUNTER — Telehealth: Payer: Self-pay

## 2022-07-24 ENCOUNTER — Other Ambulatory Visit: Payer: Self-pay

## 2022-07-24 ENCOUNTER — Inpatient Hospital Stay (HOSPITAL_BASED_OUTPATIENT_CLINIC_OR_DEPARTMENT_OTHER): Payer: 59 | Admitting: Physician Assistant

## 2022-07-24 ENCOUNTER — Inpatient Hospital Stay: Payer: 59 | Attending: Hematology and Oncology

## 2022-07-24 DIAGNOSIS — E538 Deficiency of other specified B group vitamins: Secondary | ICD-10-CM

## 2022-07-24 DIAGNOSIS — R7401 Elevation of levels of liver transaminase levels: Secondary | ICD-10-CM | POA: Insufficient documentation

## 2022-07-24 DIAGNOSIS — R110 Nausea: Secondary | ICD-10-CM | POA: Diagnosis not present

## 2022-07-24 DIAGNOSIS — Z87891 Personal history of nicotine dependence: Secondary | ICD-10-CM | POA: Insufficient documentation

## 2022-07-24 DIAGNOSIS — R5383 Other fatigue: Secondary | ICD-10-CM | POA: Diagnosis not present

## 2022-07-24 LAB — CBC WITH DIFFERENTIAL (CANCER CENTER ONLY)
Abs Immature Granulocytes: 0.02 10*3/uL (ref 0.00–0.07)
Basophils Absolute: 0 10*3/uL (ref 0.0–0.1)
Basophils Relative: 0 %
Eosinophils Absolute: 0.3 10*3/uL (ref 0.0–0.5)
Eosinophils Relative: 5 %
HCT: 37.9 % — ABNORMAL LOW (ref 39.0–52.0)
Hemoglobin: 12.6 g/dL — ABNORMAL LOW (ref 13.0–17.0)
Immature Granulocytes: 0 %
Lymphocytes Relative: 21 %
Lymphs Abs: 1.2 10*3/uL (ref 0.7–4.0)
MCH: 33 pg (ref 26.0–34.0)
MCHC: 33.2 g/dL (ref 30.0–36.0)
MCV: 99.2 fL (ref 80.0–100.0)
Monocytes Absolute: 0.6 10*3/uL (ref 0.1–1.0)
Monocytes Relative: 11 %
Neutro Abs: 3.5 10*3/uL (ref 1.7–7.7)
Neutrophils Relative %: 63 %
Platelet Count: 104 10*3/uL — ABNORMAL LOW (ref 150–400)
RBC: 3.82 MIL/uL — ABNORMAL LOW (ref 4.22–5.81)
RDW: 13.5 % (ref 11.5–15.5)
WBC Count: 5.5 10*3/uL (ref 4.0–10.5)
nRBC: 0 % (ref 0.0–0.2)

## 2022-07-24 LAB — CMP (CANCER CENTER ONLY)
ALT: 119 U/L — ABNORMAL HIGH (ref 0–44)
AST: 148 U/L — ABNORMAL HIGH (ref 15–41)
Albumin: 3.8 g/dL (ref 3.5–5.0)
Alkaline Phosphatase: 55 U/L (ref 38–126)
Anion gap: 3 — ABNORMAL LOW (ref 5–15)
BUN: 8 mg/dL (ref 8–23)
CO2: 32 mmol/L (ref 22–32)
Calcium: 9.3 mg/dL (ref 8.9–10.3)
Chloride: 104 mmol/L (ref 98–111)
Creatinine: 0.85 mg/dL (ref 0.61–1.24)
GFR, Estimated: >60 mL/min (ref >60)
Glucose, Bld: 96 mg/dL (ref 70–99)
Potassium: 3.9 mmol/L (ref 3.5–5.1)
Sodium: 139 mmol/L (ref 135–145)
Total Bilirubin: 0.4 mg/dL (ref 0.3–1.2)
Total Protein: 6.3 g/dL — ABNORMAL LOW (ref 6.5–8.1)

## 2022-07-24 LAB — FERRITIN: Ferritin: 53 ng/mL (ref 24–336)

## 2022-07-24 LAB — VITAMIN B12: Vitamin B-12: 204 pg/mL (ref 180–914)

## 2022-07-24 NOTE — Progress Notes (Signed)
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 male returns for a follow up for continued management of vitamin B12 deficiency and hereditary hemochromatosis. He is unaccompanied for this visit.   Philip Humphrey reports ongoing fatigue that does affect his ADLs. He has decreased appetite but is forcing himself to eat to maintain his weight. He has occasional episodes of nausea but no vomiting. He has good bowel habits and denies any recurrent episodes of diarrhea or constipation. He denies easy bruising or signs of bleeding. He reports that he does periodically drink alcohol but is trying to stop completely. He denies fevers, chills, night sweats, shortness of breath, chest pain or cough. He has no other complaints.    Rest of the pertinent 10 point ROS reviewed and negative.  MEDICAL HISTORY:  Past Medical History:  Diagnosis Date   Allergy    occ. seasonal   Anxiety    Arthritis    Colon polyp    Condyloma    anal   Depression    GERD (gastroesophageal reflux disease)    History of transfusion    as child   Hypertension    Knee injury    right knee cap w piece broken off- MRI done 02-06-15-pending surgery. 11-18-16 right knee is still painful, both shoulders(limited ROM right).   Knee pain    Neuropathy    bilateral- greater left   Palpitations    normal stress test 10 yrs ago   Pneumonia    Thyroid disease    Thyroid removed unintentionally at age 47- no further thyroid tissue remains-uses daily supplement    SURGICAL HISTORY: Past Surgical History:  Procedure Laterality Date   COLONOSCOPY     COLONOSCOPY N/A 02/15/2015   Procedure: COLONOSCOPY;  Surgeon: Alphonsa Overall, MD;  Location: [facility];  Service: General;  Laterality:  N/A;   cyst removed     from neck / abdomed   EXAMINATION UNDER ANESTHESIA N/A 02/15/2015   Procedure: EXAM UNDER ANESTHESIA;  Surgeon: Pedro Earls, MD;  Location: [facility];  Service: General;  Laterality: N/A;   HERNIA REPAIR Left    10 yrs ago   KNEE ARTHROSCOPY Left    x1 and meniscal tear    ROTATOR CUFF REPAIR Right    x1   SHOULDER ARTHROSCOPY W/ ROTATOR CUFF REPAIR Left    THYROIDECTOMY  age 9    TOTAL HIP ARTHROPLASTY Left 11/24/2016   Procedure: LEFT TOTAL HIP ARTHROPLASTY ANTERIOR APPROACH;  Surgeon: Paralee Cancel, MD;  Location: [facility];  Service: Orthopedics;  Laterality: Left;   TOTAL HIP ARTHROPLASTY     Right hip  Dr. Alvan Dame 08/03/17   TOTAL HIP ARTHROPLASTY Right 08/03/2017   Procedure: RIGHT TOTAL HIP ARTHROPLASTY ANTERIOR APPROACH;  Surgeon: Paralee Cancel, MD;  Location: [facility];  Service: Orthopedics;  Laterality: Right;  70 mins   UMBILICAL HERNIA REPAIR N/A 10/18/2017   Procedure: Umbilical hernia repair  and excision of lipomas;  Surgeon: Johnathan Hausen, MD;  Location: [facility];  Service: General;  Laterality: N/A;   WART FULGURATION N/A 02/15/2015   Procedure: REMOVAL OF ANAL TAGS,CONDYLOMA;  Surgeon: Pedro Earls, MD;  Location: [facility];  Service: General;  Laterality: N/A;   WRIST SURGERY     x2- left (repair tendon/ ligament)  SOCIAL HISTORY: Social History   Socioeconomic History   Marital status: Married    Spouse name: Not on file   Number of children: Not on file   Years of education: Not on file   Highest education level: Not on file  Occupational History   Not on file  Tobacco Use   Smoking status: Former    Types: Cigars   Smokeless tobacco: Never   Tobacco comments:    occasional quit 2012  Vaping Use   Vaping Use: Never used  Substance and Sexual Activity   Alcohol use: Yes    Comment: drinking liquor each night recently   Drug use: No   Sexual activity: Yes  Other Topics Concern   Not on file  Social History Narrative   Not on  file   Social Determinants of Health   Financial Resource Strain: Not on file  Food Insecurity: Not on file  Transportation Needs: Not on file  Physical Activity: Not on file  Stress: Not on file  Social Connections: Not on file  Intimate Partner Violence: Not on file    FAMILY HISTORY: Family History  Problem Relation Age of Onset   Heart disease Mother            Lung disease Brother    Stroke Father     ALLERGIES:  is allergic to sulfa antibiotics and oxycodone.  MEDICATIONS:  Current Outpatient Medications  Medication Sig Dispense Refill   atorvastatin (LIPITOR) 80 MG tablet Take 80 mg by mouth daily.     bismuth subsalicylate (PEPTO BISMOL) 262 MG/15ML suspension Take 30 mLs by mouth every 6 (six) hours as needed for indigestion (stomach pain).     calcium carbonate (TUMS - DOSED IN MG ELEMENTAL CALCIUM) 500 MG chewable tablet Chew 2 tablets by mouth at bedtime as needed for indigestion or heartburn.     cyanocobalamin (,VITAMIN B-12,) 1000 MCG/ML injection Inject 1,000 mcg into the muscle once a week.     diclofenac Sodium (VOLTAREN) 1 % GEL Apply 1 application topically 2 (two) times daily as needed (pain).     diphenhydramine-acetaminophen (TYLENOL PM) 25-500 MG TABS tablet Take 1 tablet by mouth at bedtime as needed (sleep).     famotidine (PEPCID) 40 MG tablet Take 40 mg by mouth daily as needed for heartburn or indigestion.     folic acid (FOLVITE) 1 MG tablet Take 1 tablet (1 mg total) by mouth daily. 30 tablet 0   gabapentin (NEURONTIN) 300 MG capsule Take 600 mg by mouth 2 (two) times daily. Pt currently taking 600 mg BID     hydrOXYzine (ATARAX/VISTARIL) 25 MG tablet Take 25 mg by mouth every 4 (four) hours as needed. For itching     Magnesium 400 MG TABS Take 400 mg by mouth daily.     MELATONIN PO Take 1 tablet by mouth at bedtime.     nitroGLYCERIN (NITROSTAT) 0.4 MG SL tablet Place 0.4 mg under the tongue every 5 (five) minutes as needed for chest pain.      olmesartan (BENICAR) 20 MG tablet Take 20 mg by mouth daily.     Olopatadine HCl (PATADAY OP) Place 1 drop into both eyes 2 (two) times daily as needed (itching/allergies).     SYNTHROID 175 MCG tablet Take 1 tablet (175 mcg total) by mouth daily. NO MORE REFILLS WITHOUT OFFICE VISIT - 2ND NOTICE (Patient taking differently: Take 175 mcg by mouth daily.) 15 tablet 0   thiamine 100 MG tablet Take  1 tablet (100 mg total) by mouth daily. 30 tablet 0   traMADol (ULTRAM) 50 MG tablet Take 50 mg by mouth every 4 (four) hours as needed for moderate pain.     Vitamin D, Ergocalciferol, (DRISDOL) 1.25 MG (50000 UNIT) CAPS capsule Take 50,000 Units by mouth every 7 (seven) days.     lisinopril (ZESTRIL) 20 MG tablet Take 1 tablet (20 mg total) by mouth daily. (Patient not taking: Reported on 07/24/2022)     No current facility-administered medications for this visit.    PHYSICAL EXAMINATION: ECOG PERFORMANCE STATUS: 0 - Asymptomatic  Vitals:   07/24/22 0836  BP: 126/81  Pulse: 80  Resp: 17  Temp: 97.7 F (36.5 C)  SpO2: 100%   Filed Weights   07/24/22 0836  Weight: 211 lb 11.2 oz (96 kg)   PHYSICAL EXAM: Constitutional: Oriented to person, place, and time and well-developed, well-nourished, and in no distress.  HENT:  Head: Normocephalic and atraumatic.  Eyes: Conjunctivae are normal. Right eye exhibits no discharge. Left eye exhibits no discharge. No scleral icterus.  Neck: Normal range of motion. Neck supple.   Cardiovascular: Normal rate, regular rhythm, normal heart sounds and intact distal pulses.   Pulmonary/Chest: Effort normal and breath sounds normal. No respiratory distress. No wheezes. No rales.  Musculoskeletal: Normal range of motion.Bilateral lower extremity edema. Lymphadenopathy: No cervical adenopathy.  Neurological: Alert and oriented to person, place, and time. Exhibits normal muscle tone. Gait normal. Coordination normal.  Skin: Skin is warm and dry. No rash noted. Not  diaphoretic. No erythema. No pallor.  Psychiatric: Mood, memory and judgment normal.    LABORATORY DATA:  I have reviewed the data as listed Lab Results  Component Value Date   WBC 5.5 07/24/2022   HGB 12.6 (L) 07/24/2022   HCT 37.9 (L) 07/24/2022   MCV 99.2 07/24/2022   PLT 104 (L) 07/24/2022     Chemistry      Component Value Date/Time   NA 141 05/21/2022 1515   K 4.3 05/21/2022 1515   CL 106 05/21/2022 1515   CO2 30 05/21/2022 1515   BUN 12 05/21/2022 1515   CREATININE 0.87 05/21/2022 1515   CREATININE 1.39 (H) 04/20/2022 1153   CREATININE 1.22 07/13/2013 1253      Component Value Date/Time   CALCIUM 9.4 05/21/2022 1515   ALKPHOS 37 (L) 05/21/2022 1515   AST 27 05/21/2022 1515   AST 58 (H) 04/20/2022 1153   ALT 22 05/21/2022 1515   ALT 66 (H) 04/20/2022 1153   BILITOT 0.4 05/21/2022 1515   BILITOT 0.3 04/20/2022 1153     CBC reviewed,  RADIOGRAPHIC STUDIES: I have personally reviewed the radiological images as listed and agreed with the findings in the report. No results found.  ASSESSMENT & PLAN:  ALISHA BURGO is a 60 y.o. male who returns for a follow up for vitamin B12 deficiency and hereditary hemochromatosis.   #1 Hereditary hemochromatosis, carrier for C282Y gene: --Labs today reviewed, Hgb 12.6, MCV 99.2, Plt 104, Ferritin 53.  --Do not recommend therapeutic phlebotomy at this time to prevent worsening anemia since ferritin is in acceptable range.  --RTC in 8 weeks with labs and follow up visit with Dr. Chryl Heck   2.  B12 deficiency: --Not taking B12 injections at home. --Today's B12 level is low end of normal. Recommend to resume B12 supplementation. Sent sublingual B12 supplementation as patient reports that he has taken B12 injections for quite some time without any improvement of levels.  3. Elevated liver enzymes:  --AST 148, ALT 119, likely due to alcohol consumption.  --Patient is under the care of [facility]. Advised follow up to further  evaluate.   All questions were answered. The patient knows to call the clinic with any problems, questions or concerns.  I have spent a total of 30 minutes minutes of face-to-face and non-face-to-face time, preparing to see the patient, performing a medically appropriate examination, counseling and educating the patient, ordering medications/tests, communicating with other health care professionals, documenting clinical information in the electronic health record, and care coordination.   Dede Query PA-C Dept of Hematology and [place] at [facility] Phone: [phone]

## 2022-07-24 NOTE — Telephone Encounter (Signed)
Pt advised with VU 

## 2022-07-24 NOTE — Telephone Encounter (Signed)
-----   Message from Lincoln Brigham, PA-C sent at 07/24/2022  2:49 PM EDT ----- Also, his liver enzymes are elevated so advised to hold any alcohol consumption. Please call his GI, Dr. Leonette Most and request a follow up.

## 2022-07-26 ENCOUNTER — Encounter: Payer: Self-pay | Admitting: Hematology and Oncology

## 2022-07-26 IMAGING — CT CT HEAD W/O CM
4 series · 16 of 47 positions shown, 18 images · non-contrast
Comparison: July 07, 2021

CLINICAL DATA: Head trauma.  Possible syncopal episode.



[Series 3: head without · axial · non-contrast · 0.46mm/px · z∈[-153,-33]mm · 7 of 34 slices shown, 9 images]
[im 5/34  brain]
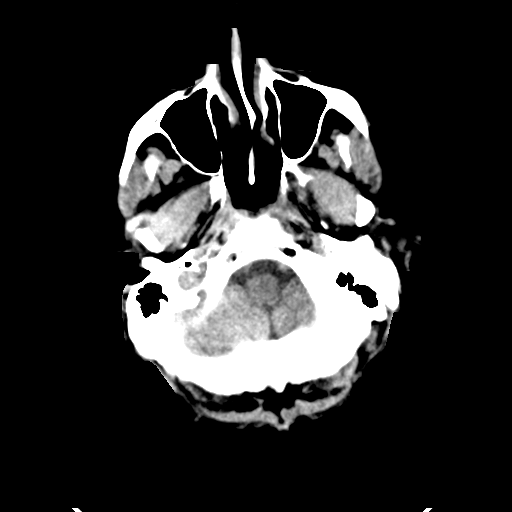
[im 5/34  bone]
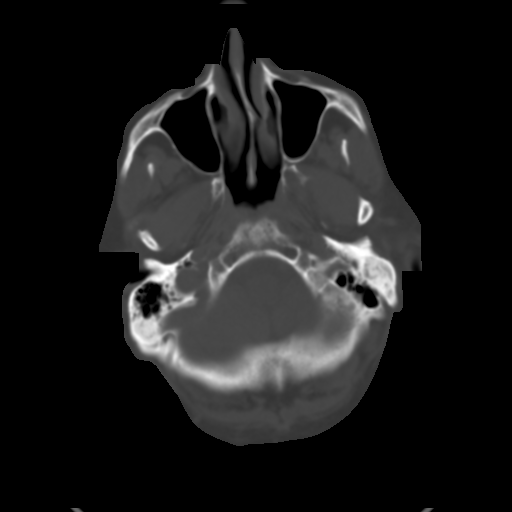
[im 9/34  brain]
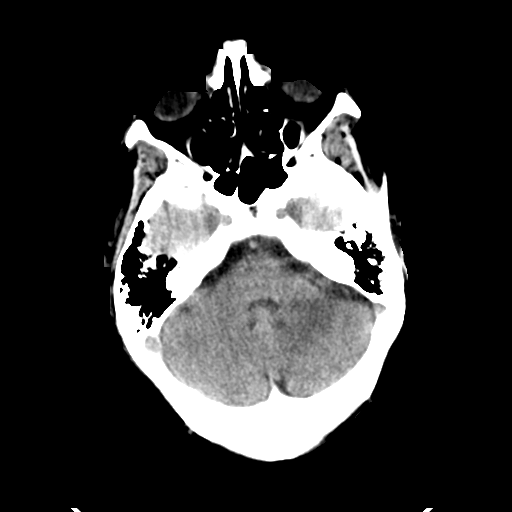
[im 13/34  brain]
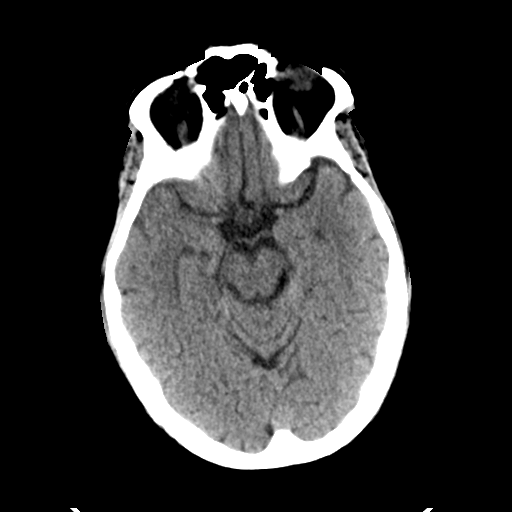
[im 17/34  brain]
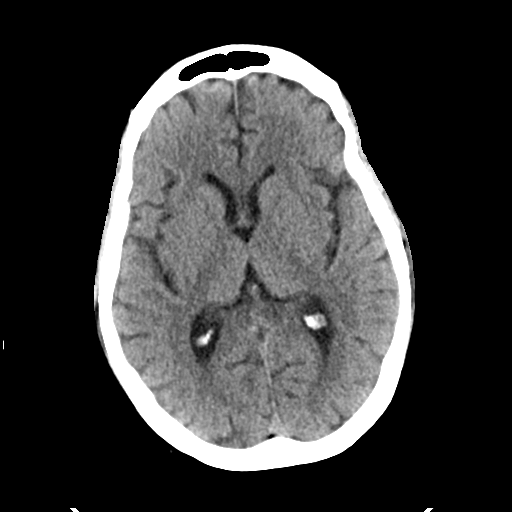
[im 21/34  brain]
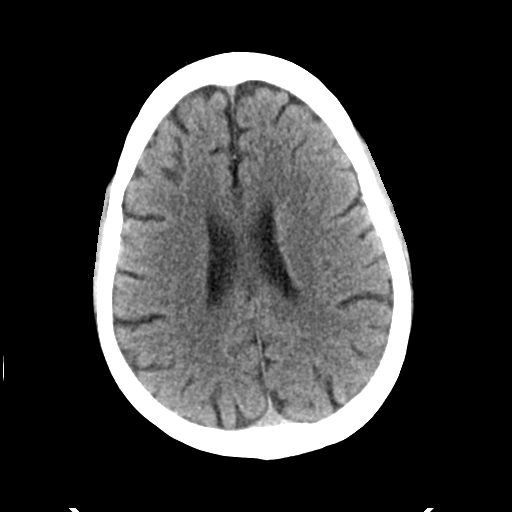
[im 21/34  bone]
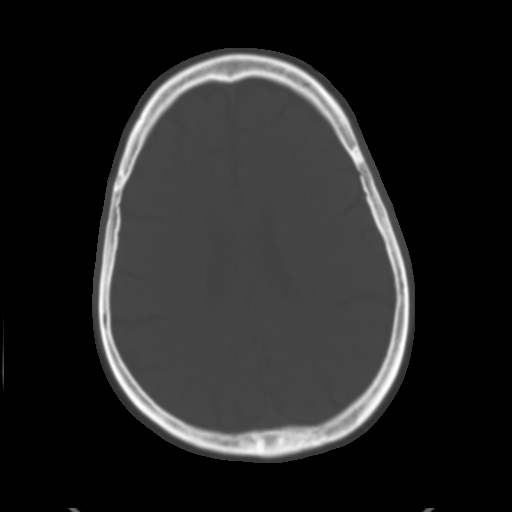
[im 25/34  brain]
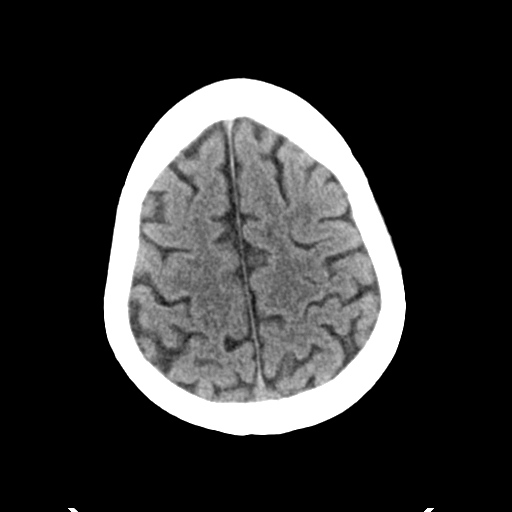
[im 29/34  brain]
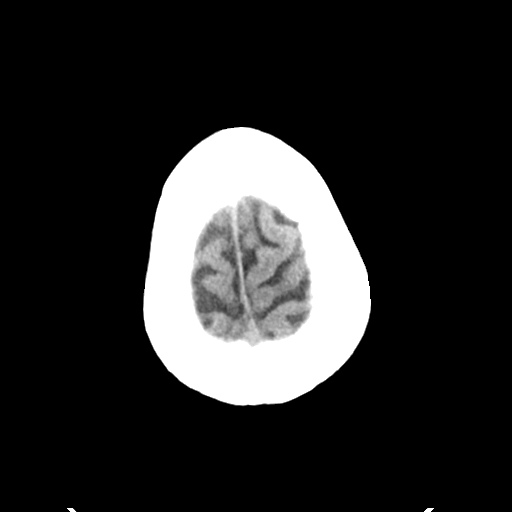

[Series 4: head bone · axial · 0.46mm/px · z∈[-157,-123]mm · 3 of 85 slices shown]
[im 9/85  bone]
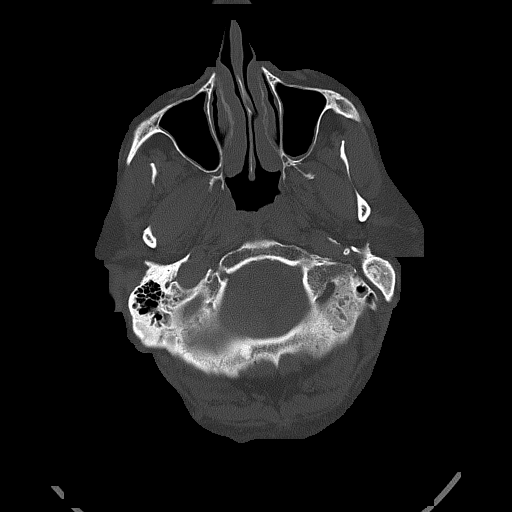
[im 17/85  bone]
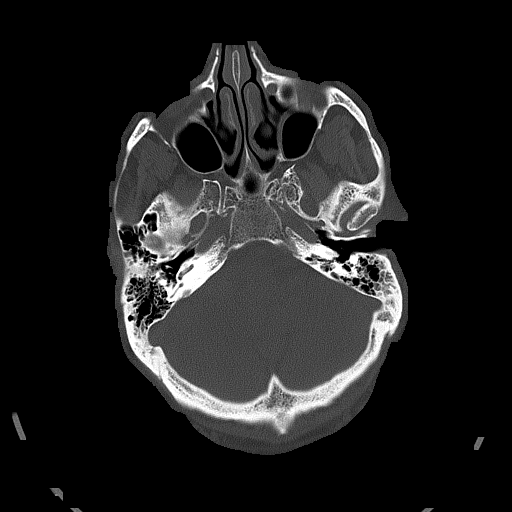
[im 26/85  bone]
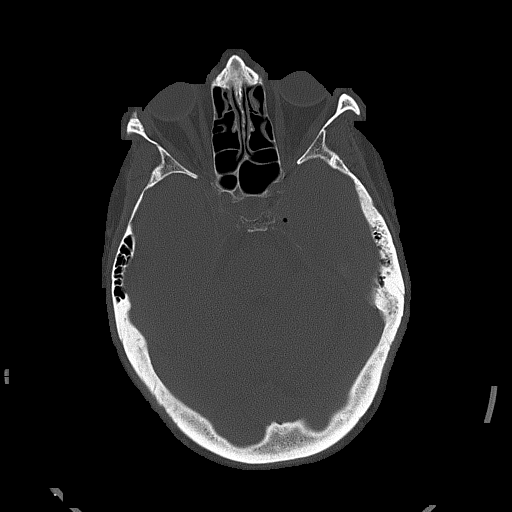

[Series 5: head without cor · coronal · non-contrast · 0.39mm/px · 3 of 72 slices shown]
[im 24/72  brain]
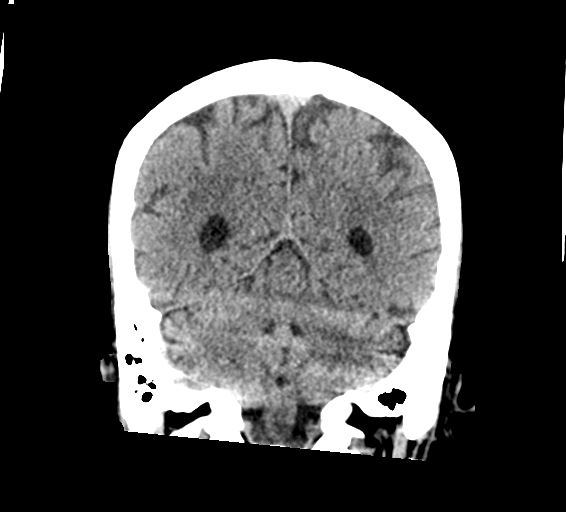
[im 32/72  brain]
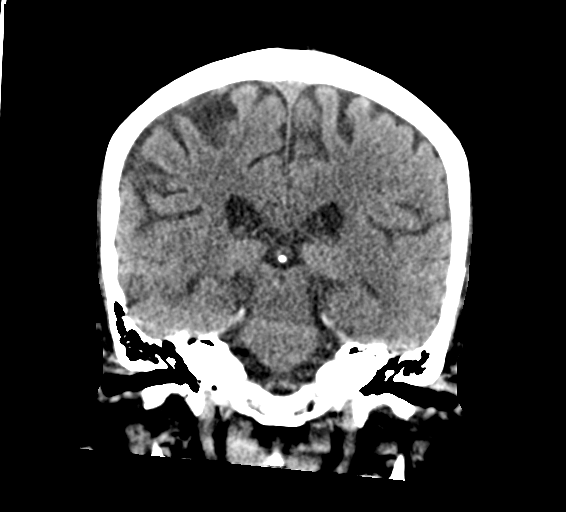
[im 40/72  brain]
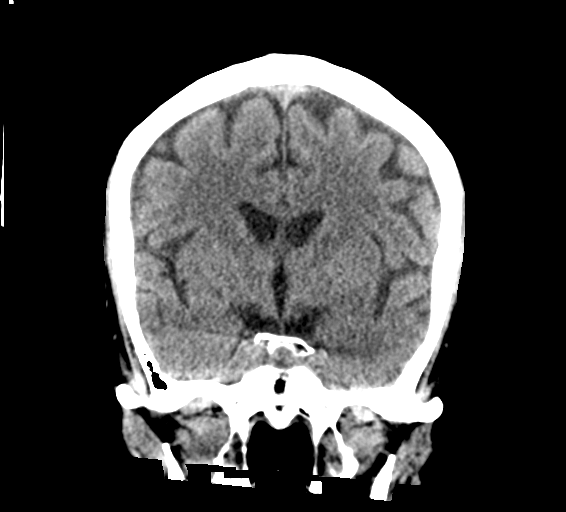

[Series 6: head without sag · sagittal · non-contrast · 0.39mm/px · 3 of 59 slices shown]
[im 20/59  brain]
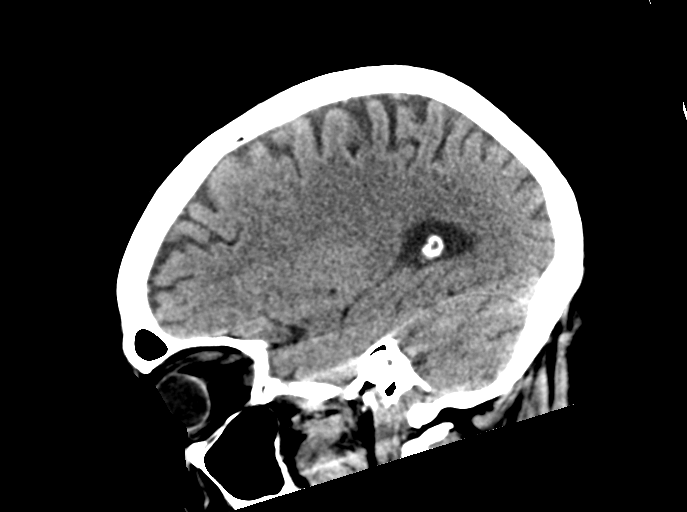
[im 30/59  brain]
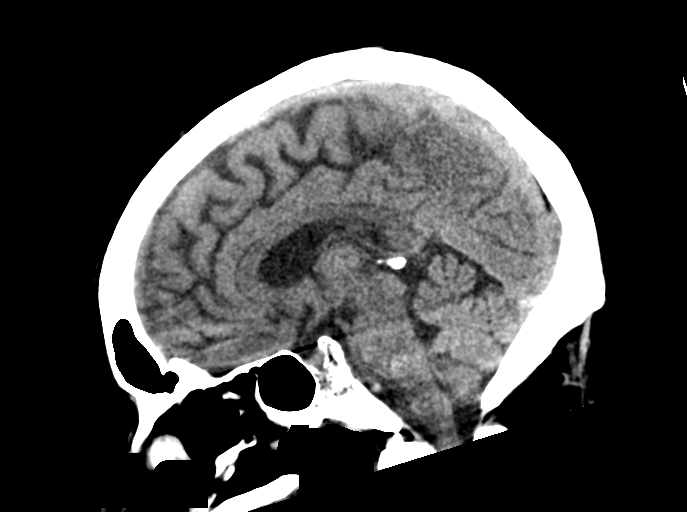
[im 39/59  brain]
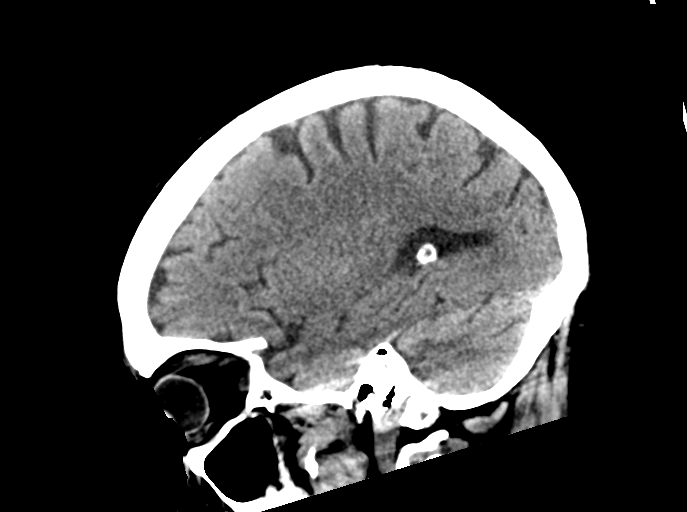

[16 of 47 positions shown; findings below may reference images not displayed]

FINDINGS: Brain: No evidence of acute infarction, hemorrhage, hydrocephalus,
extra-axial collection or mass lesion/mass effect.

Vascular: No hyperdense vessel or unexpected calcification.

Skull: Normal. Negative for fracture or focal lesion.

Sinuses/Orbits: No acute finding.

Other: None.
IMPRESSION: No acute intracranial abnormality.

## 2022-07-26 IMAGING — CT CT CERVICAL SPINE W/O CM
3 of 4 series · 13 of 33 positions shown, 16 images · non-contrast
Comparison: None.

CLINICAL DATA: Neck trauma.



[Series 5: c_spine 2.0 st · axial · 0.29mm/px · z∈[-287,-141]mm · 5 of 103 slices shown, 7 images]
[im 15/103  soft-tissue]
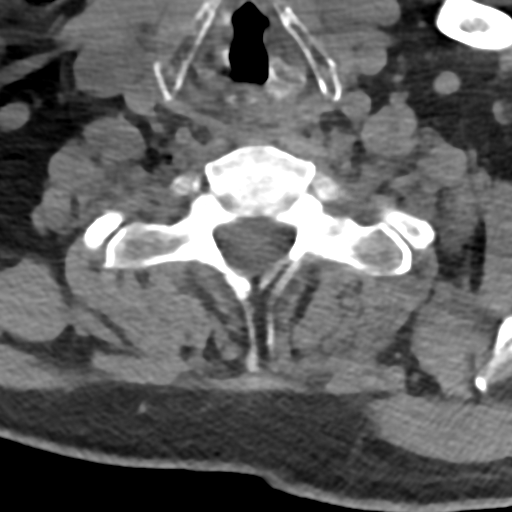
[im 15/103  bone]
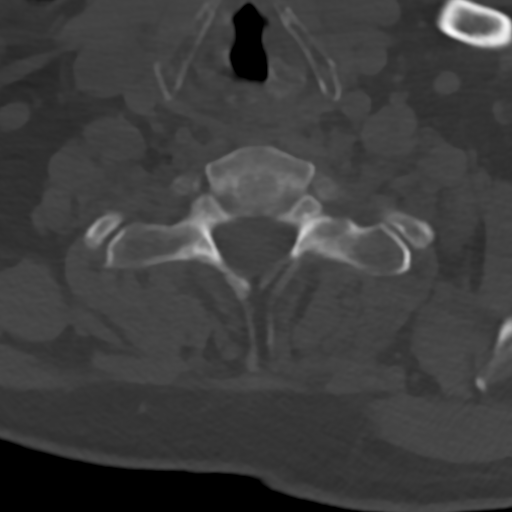
[im 30/103  bone]
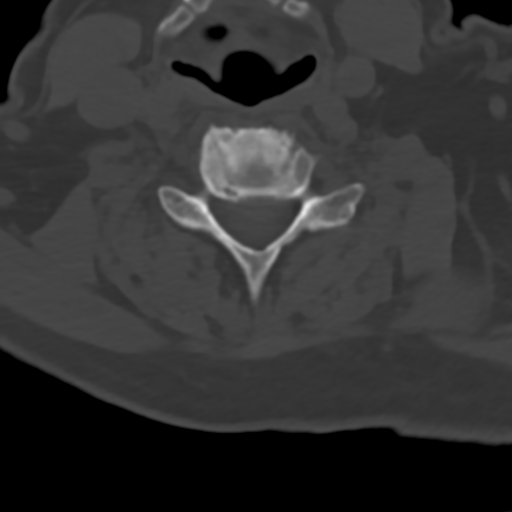
[im 59/103  bone]
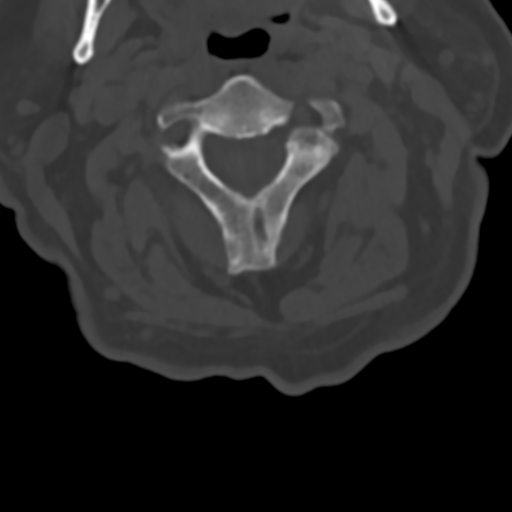
[im 73/103  bone]
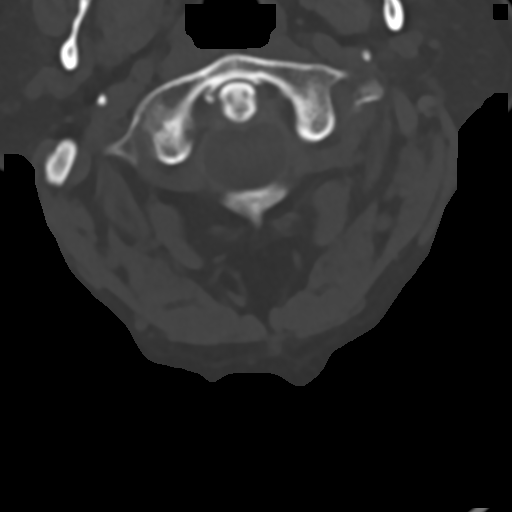
[im 88/103  soft-tissue]
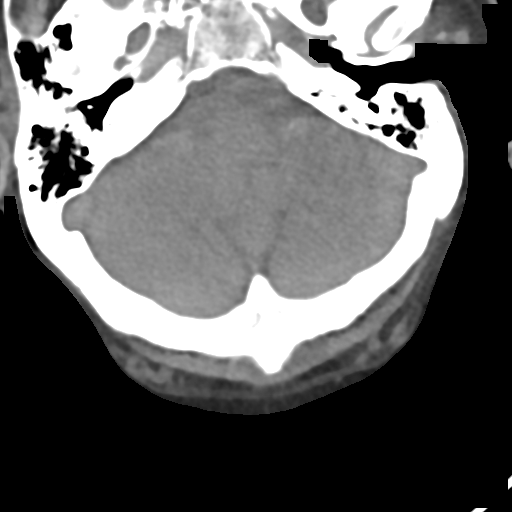
[im 88/103  bone]
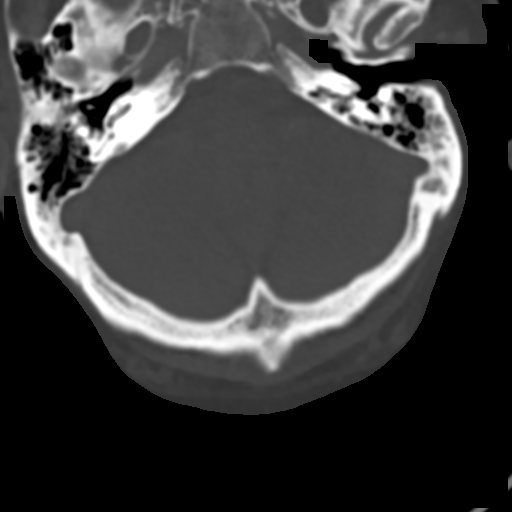

[Series 7: c_spine 2.0 sag bone · sagittal · 0.31mm/px · 5 of 51 slices shown, 6 images]
[im 17/51  bone]
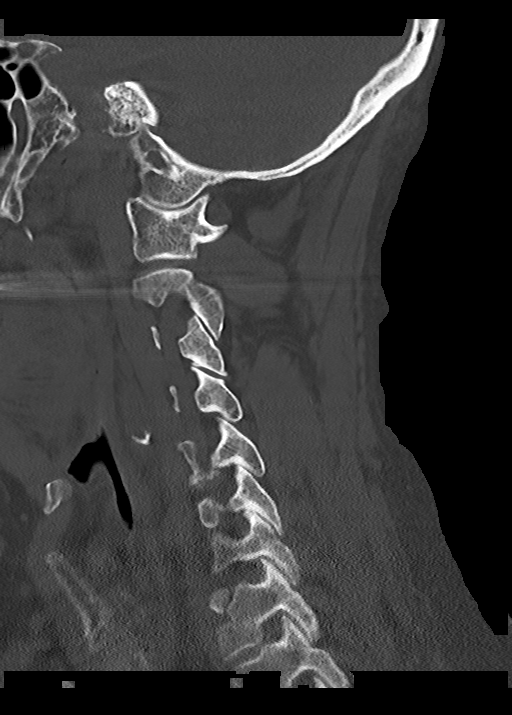
[im 21/51  bone]
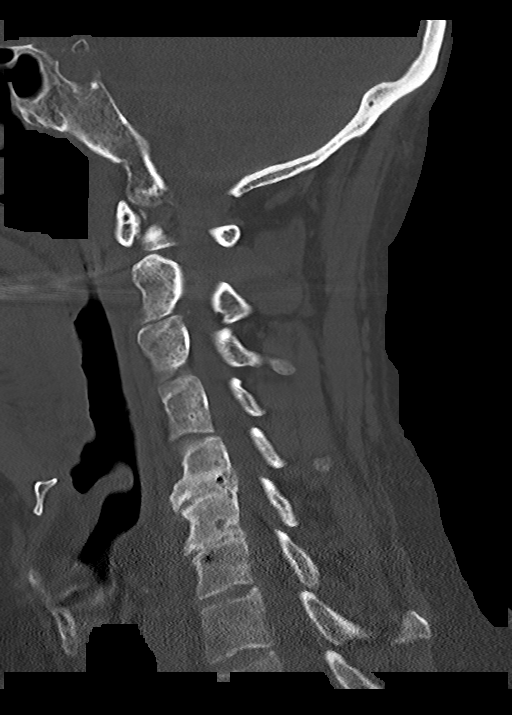
[im 26/51  soft-tissue]
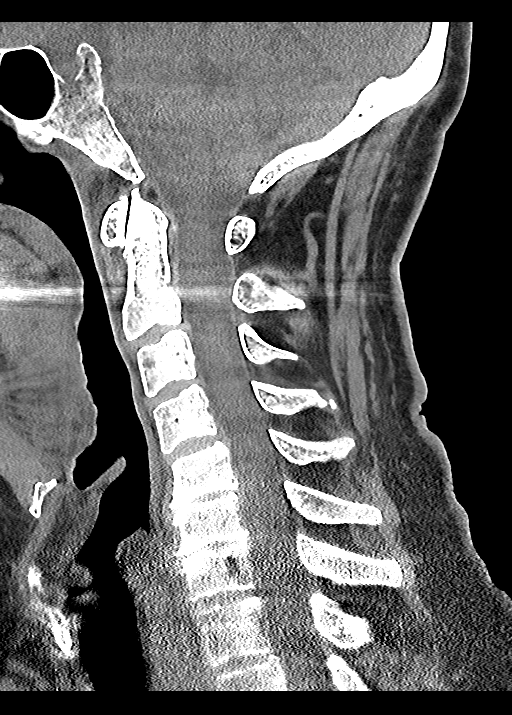
[im 26/51  bone]
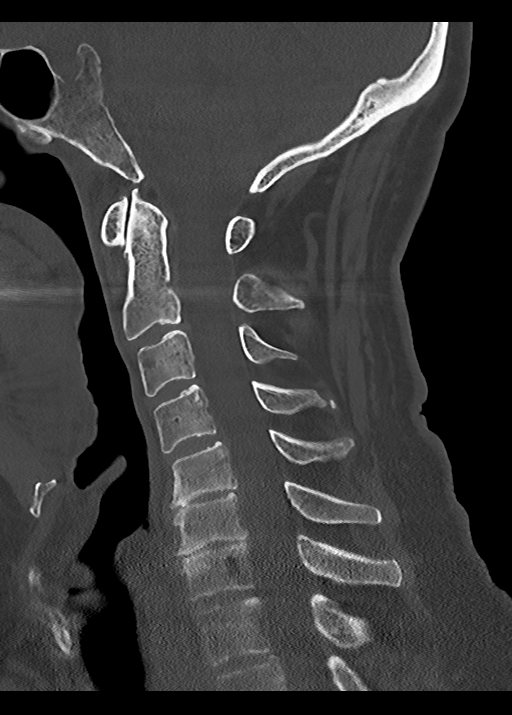
[im 30/51  bone]
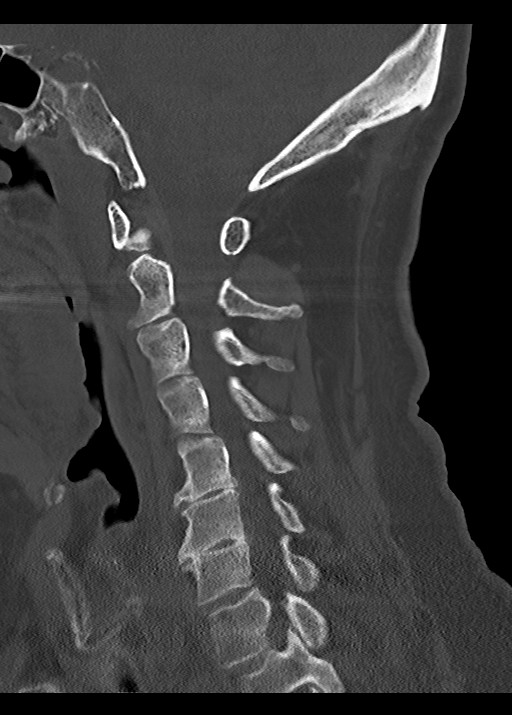
[im 34/51  bone]
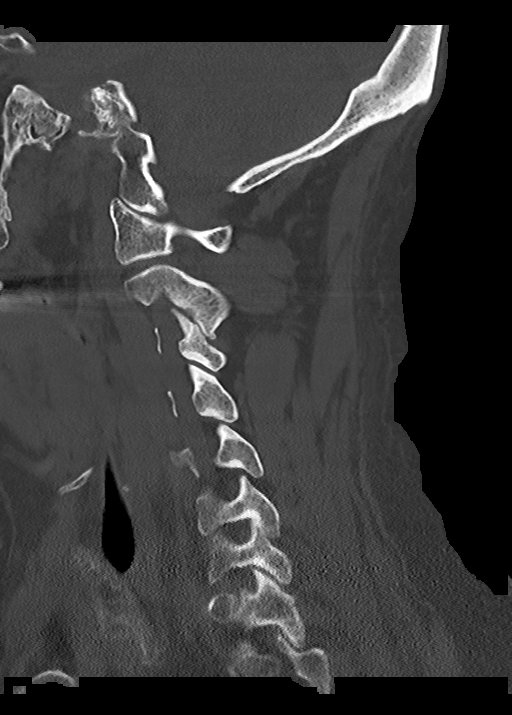

[Series 8: c_spine 2.0 cor bone · coronal · 0.32mm/px · 3 of 59 slices shown]
[im 12/59  bone]
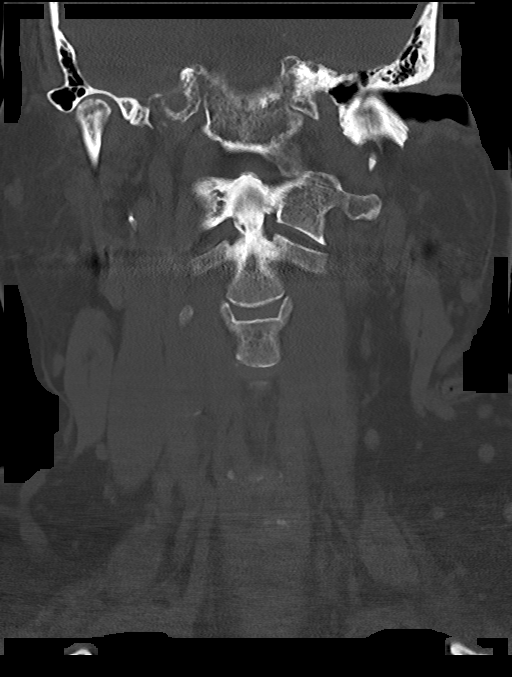
[im 24/59  bone]
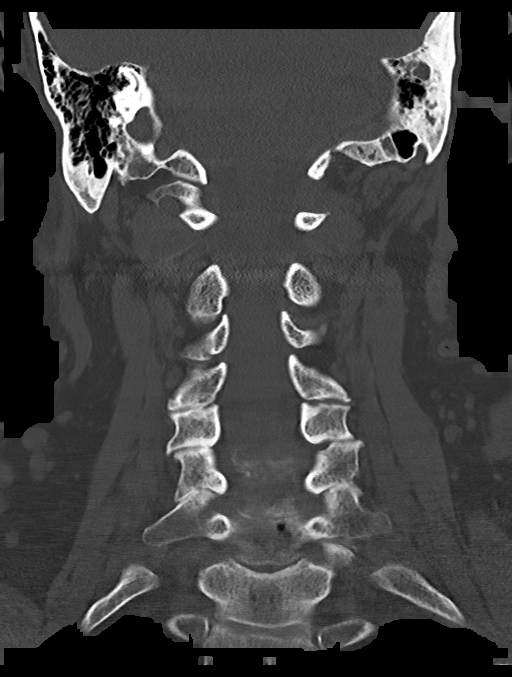
[im 35/59  bone]
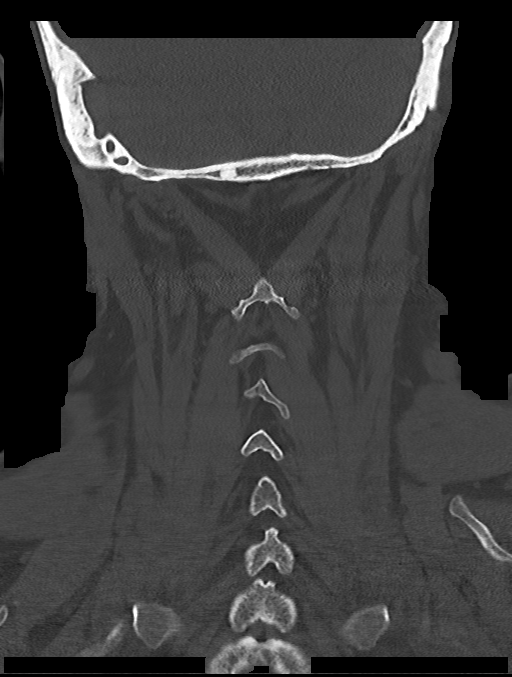

[13 of 33 positions shown; findings below may reference images not displayed]

FINDINGS: Alignment: Normal.

Skull base and vertebrae: No acute fracture. No primary bone lesion
or focal pathologic process.

Soft tissues and spinal canal: No prevertebral fluid or swelling. No
visible canal hematoma.

Disc levels:  Multilevel mild osteoarthritic changes.

Upper chest: Negative.

Other: None.
IMPRESSION: 1. No acute fracture or subluxation of the cervical spine.
2. Multilevel mild osteoarthritic changes of the cervical spine.

## 2022-07-26 IMAGING — CR DG CHEST 1V
1 series · 1 of 1 positions shown · non-contrast
Comparison: Portable chest 07/07/2021.

CLINICAL DATA: Chest pain and hypotension.

EXAM:
CHEST  1 VIEW

[chest ap]
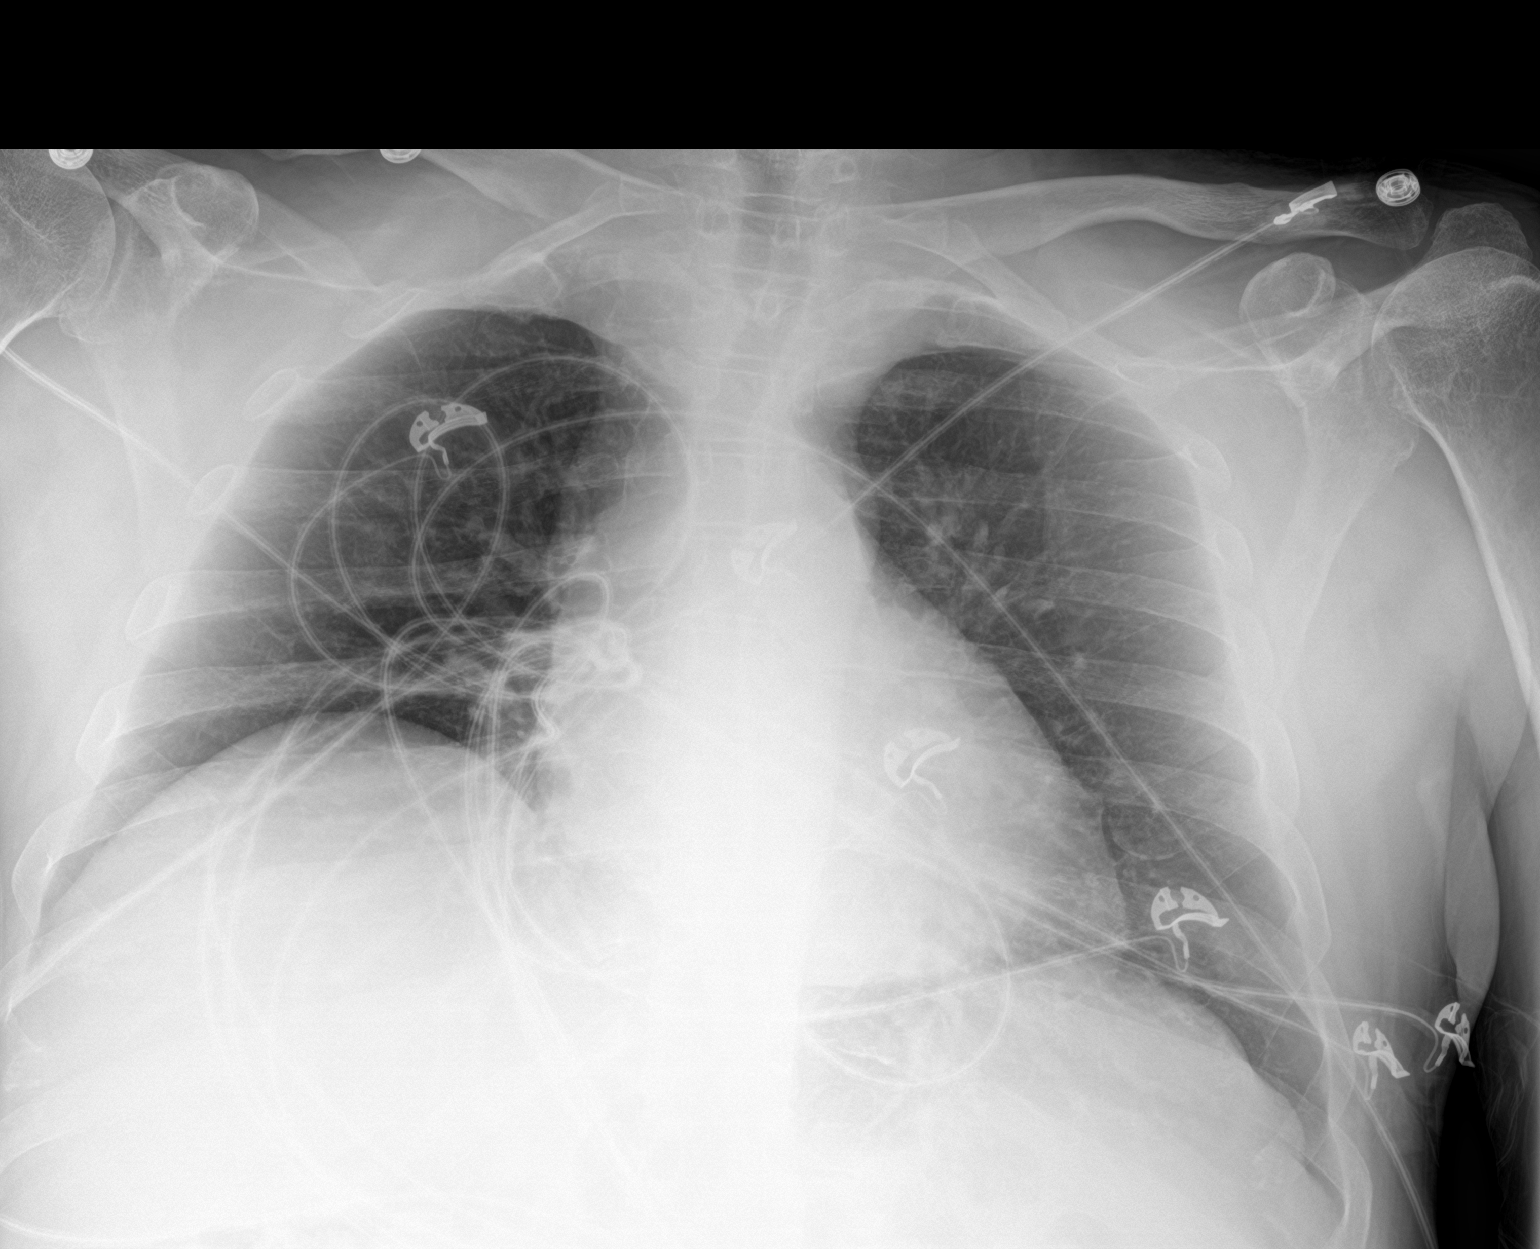

[1 of 1 positions shown; findings below may reference images not displayed]

FINDINGS: The heart size and mediastinal contours are within normal limits.
Both lungs are clear with chronic asymmetric elevation of the right
diaphragm. The visualized skeletal structures are unremarkable apart
from an old distal right clavicle resection.
IMPRESSION: No evidence of acute chest disease or interval changes

## 2022-07-26 MED ORDER — B-12 1000 MCG SL SUBL
1000.0000 ug | SUBLINGUAL_TABLET | Freq: Every day | SUBLINGUAL | 6 refills | Status: DC
Start: 2022-07-26 — End: 2023-10-04

## 2022-07-27 ENCOUNTER — Telehealth: Payer: Self-pay | Admitting: Hematology and Oncology

## 2022-07-27 ENCOUNTER — Telehealth: Payer: Self-pay

## 2022-07-27 NOTE — Telephone Encounter (Signed)
Per 8/4 los called and spoke to pt about appointment pt confirmed appointment

## 2022-07-27 NOTE — Telephone Encounter (Signed)
can you reach out to [facility] medical and request a follow up visit due to elevated liver enzymes  Call to [facility] and pt has an appt 8/9. Advised pt will need to be monitored for elevated liver enzymes   Office note and labs faxed.  Confirmation received

## 2022-08-10 ENCOUNTER — Emergency Department (HOSPITAL_COMMUNITY): Payer: 59

## 2022-08-10 ENCOUNTER — Other Ambulatory Visit: Payer: Self-pay

## 2022-08-10 ENCOUNTER — Inpatient Hospital Stay (HOSPITAL_COMMUNITY)
Admission: EM | Admit: 2022-08-10 | Discharge: 2022-08-12 | DRG: 684 | Disposition: A | Discharge status: Home / Self Care | Payer: 59 | Attending: Internal Medicine | Admitting: Internal Medicine

## 2022-08-10 ENCOUNTER — Encounter (HOSPITAL_COMMUNITY): Payer: Self-pay

## 2022-08-10 DIAGNOSIS — G629 Polyneuropathy, unspecified: Secondary | ICD-10-CM

## 2022-08-10 DIAGNOSIS — Z8249 Family history of ischemic heart disease and other diseases of the circulatory system: Secondary | ICD-10-CM

## 2022-08-10 DIAGNOSIS — F32A Depression, unspecified: Secondary | ICD-10-CM | POA: Diagnosis present

## 2022-08-10 DIAGNOSIS — E538 Deficiency of other specified B group vitamins: Secondary | ICD-10-CM | POA: Diagnosis present

## 2022-08-10 DIAGNOSIS — E860 Dehydration: Secondary | ICD-10-CM | POA: Diagnosis present

## 2022-08-10 DIAGNOSIS — I10 Essential (primary) hypertension: Secondary | ICD-10-CM | POA: Diagnosis present

## 2022-08-10 DIAGNOSIS — Z882 Allergy status to sulfonamides status: Secondary | ICD-10-CM

## 2022-08-10 DIAGNOSIS — Z79890 Hormone replacement therapy (postmenopausal): Secondary | ICD-10-CM

## 2022-08-10 DIAGNOSIS — Z96643 Presence of artificial hip joint, bilateral: Secondary | ICD-10-CM | POA: Diagnosis present

## 2022-08-10 DIAGNOSIS — R55 Syncope and collapse: Secondary | ICD-10-CM | POA: Diagnosis present

## 2022-08-10 DIAGNOSIS — Z885 Allergy status to narcotic agent status: Secondary | ICD-10-CM

## 2022-08-10 DIAGNOSIS — F1010 Alcohol abuse, uncomplicated: Secondary | ICD-10-CM | POA: Diagnosis present

## 2022-08-10 DIAGNOSIS — I951 Orthostatic hypotension: Secondary | ICD-10-CM | POA: Diagnosis not present

## 2022-08-10 DIAGNOSIS — E890 Postprocedural hypothyroidism: Secondary | ICD-10-CM | POA: Diagnosis present

## 2022-08-10 DIAGNOSIS — E039 Hypothyroidism, unspecified: Secondary | ICD-10-CM | POA: Diagnosis present

## 2022-08-10 DIAGNOSIS — Z23 Encounter for immunization: Secondary | ICD-10-CM

## 2022-08-10 DIAGNOSIS — F419 Anxiety disorder, unspecified: Secondary | ICD-10-CM | POA: Diagnosis present

## 2022-08-10 DIAGNOSIS — N179 Acute kidney failure, unspecified: Secondary | ICD-10-CM | POA: Diagnosis not present

## 2022-08-10 DIAGNOSIS — F1090 Alcohol use, unspecified, uncomplicated: Secondary | ICD-10-CM

## 2022-08-10 DIAGNOSIS — Z87891 Personal history of nicotine dependence: Secondary | ICD-10-CM

## 2022-08-10 DIAGNOSIS — E785 Hyperlipidemia, unspecified: Secondary | ICD-10-CM

## 2022-08-10 DIAGNOSIS — R197 Diarrhea, unspecified: Secondary | ICD-10-CM

## 2022-08-10 DIAGNOSIS — Z79899 Other long term (current) drug therapy: Secondary | ICD-10-CM

## 2022-08-10 DIAGNOSIS — K219 Gastro-esophageal reflux disease without esophagitis: Secondary | ICD-10-CM | POA: Diagnosis present

## 2022-08-10 LAB — COMPREHENSIVE METABOLIC PANEL
ALT: 32 U/L (ref 0–44)
AST: 39 U/L (ref 15–41)
Albumin: 4.7 g/dL (ref 3.5–5.0)
Alkaline Phosphatase: 54 U/L (ref 38–126)
Anion gap: 10 (ref 5–15)
BUN: 20 mg/dL (ref 8–23)
CO2: 27 mmol/L (ref 22–32)
Calcium: 10.4 mg/dL — ABNORMAL HIGH (ref 8.9–10.3)
Chloride: 100 mmol/L (ref 98–111)
Creatinine, Ser: 2.34 mg/dL — ABNORMAL HIGH (ref 0.61–1.24)
GFR, Estimated: 31 mL/min — ABNORMAL LOW (ref >60)
Glucose, Bld: 91 mg/dL (ref 70–99)
Potassium: 3.7 mmol/L (ref 3.5–5.1)
Sodium: 137 mmol/L (ref 135–145)
Total Bilirubin: 0.2 mg/dL — ABNORMAL LOW (ref 0.3–1.2)
Total Protein: 7.6 g/dL (ref 6.5–8.1)

## 2022-08-10 LAB — URINALYSIS, ROUTINE W REFLEX MICROSCOPIC
Bacteria, UA: NONE SEEN
Bilirubin Urine: NEGATIVE
Glucose, UA: NEGATIVE mg/dL
Hgb urine dipstick: NEGATIVE
Ketones, ur: NEGATIVE mg/dL
Nitrite: NEGATIVE
Protein, ur: 30 mg/dL — AB
Specific Gravity, Urine: 1.02 (ref 1.005–1.030)
pH: 5 (ref 5.0–8.0)

## 2022-08-10 LAB — CBC
HCT: 45 % (ref 39.0–52.0)
Hemoglobin: 15.1 g/dL (ref 13.0–17.0)
MCH: 33.3 pg (ref 26.0–34.0)
MCHC: 33.6 g/dL (ref 30.0–36.0)
MCV: 99.3 fL (ref 80.0–100.0)
Platelets: 149 10*3/uL — ABNORMAL LOW (ref 150–400)
RBC: 4.53 MIL/uL (ref 4.22–5.81)
RDW: 14.6 % (ref 11.5–15.5)
WBC: 9.6 10*3/uL (ref 4.0–10.5)
nRBC: 0 % (ref 0.0–0.2)

## 2022-08-10 LAB — TROPONIN I (HIGH SENSITIVITY): Troponin I (High Sensitivity): 2 ng/L (ref <18)

## 2022-08-10 LAB — CBG MONITORING, ED: Glucose-Capillary: 84 mg/dL (ref 70–99)

## 2022-08-10 LAB — ETHANOL: Alcohol, Ethyl (B): <10 mg/dL (ref <10)

## 2022-08-10 MED ORDER — ATORVASTATIN CALCIUM 40 MG PO TABS
80.0000 mg | ORAL_TABLET | Freq: Every day | ORAL | Status: DC
  Administered 2022-08-11 – 2022-08-12 (×2): 80 mg via ORAL
  Filled 2022-08-10 (×2): qty 2

## 2022-08-10 MED ORDER — GABAPENTIN 300 MG PO CAPS: 600.0000 mg | ORAL_CAPSULE | Freq: Every day | ORAL | Status: DC

## 2022-08-10 MED ORDER — ACETAMINOPHEN 650 MG RE SUPP: 650.0000 mg | Freq: Four times a day (QID) | RECTAL | Status: DC | PRN

## 2022-08-10 MED ORDER — THIAMINE HCL 100 MG/ML IJ SOLN: 100.0000 mg | Freq: Every day | INTRAMUSCULAR | Status: DC

## 2022-08-10 MED ORDER — VITAMIN B-12 1000 MCG PO TABS
1000.0000 ug | ORAL_TABLET | Freq: Every day | ORAL | Status: DC
  Administered 2022-08-11 – 2022-08-12 (×2): 1000 ug via ORAL
  Filled 2022-08-10 (×2): qty 1

## 2022-08-10 MED ORDER — FOLIC ACID 1 MG PO TABS
1.0000 mg | ORAL_TABLET | Freq: Every day | ORAL | Status: DC
  Administered 2022-08-11 – 2022-08-12 (×2): 1 mg via ORAL
  Filled 2022-08-10 (×2): qty 1

## 2022-08-10 MED ORDER — SODIUM CHLORIDE 0.9 % IV BOLUS
2000.0000 mL | Freq: Once | INTRAVENOUS | Status: AC
  Administered 2022-08-10: 2000 mL via INTRAVENOUS

## 2022-08-10 MED ORDER — ADULT MULTIVITAMIN W/MINERALS CH
1.0000 | ORAL_TABLET | Freq: Every day | ORAL | Status: DC
  Administered 2022-08-11 – 2022-08-12 (×2): 1 via ORAL
  Filled 2022-08-10 (×2): qty 1

## 2022-08-10 MED ORDER — LORAZEPAM 2 MG/ML IJ SOLN: 1.0000 mg | INTRAMUSCULAR | Status: DC | PRN

## 2022-08-10 MED ORDER — SODIUM CHLORIDE 0.9 % IV SOLN
INTRAVENOUS | Status: AC
  Administered 2022-08-10: 125 mL/h via INTRAVENOUS

## 2022-08-10 MED ORDER — ACETAMINOPHEN 325 MG PO TABS
650.0000 mg | ORAL_TABLET | Freq: Four times a day (QID) | ORAL | Status: DC | PRN
  Administered 2022-08-11: 650 mg via ORAL
  Filled 2022-08-10: qty 2

## 2022-08-10 MED ORDER — HYDROXYZINE HCL 25 MG PO TABS
25.0000 mg | ORAL_TABLET | ORAL | Status: DC | PRN
Start: 2022-08-10 — End: 2022-08-12
  Administered 2022-08-11 – 2022-08-12 (×2): 25 mg via ORAL
  Filled 2022-08-10 (×2): qty 1

## 2022-08-10 MED ORDER — SODIUM CHLORIDE 0.9 % IV BOLUS: 1000.0000 mL | Freq: Once | INTRAVENOUS | Status: DC

## 2022-08-10 MED ORDER — LORAZEPAM 1 MG PO TABS: 1.0000 mg | ORAL_TABLET | ORAL | Status: DC | PRN

## 2022-08-10 MED ORDER — GABAPENTIN 300 MG PO CAPS
300.0000 mg | ORAL_CAPSULE | Freq: Every day | ORAL | Status: DC
  Administered 2022-08-11 – 2022-08-12 (×2): 300 mg via ORAL
  Filled 2022-08-10 (×2): qty 1

## 2022-08-10 MED ORDER — THIAMINE HCL 100 MG PO TABS
100.0000 mg | ORAL_TABLET | Freq: Every day | ORAL | Status: DC
  Administered 2022-08-11 – 2022-08-12 (×2): 100 mg via ORAL
  Filled 2022-08-10 (×2): qty 1

## 2022-08-10 MED ORDER — FAMOTIDINE 20 MG PO TABS: 20.0000 mg | ORAL_TABLET | Freq: Every day | ORAL | Status: DC | PRN

## 2022-08-10 MED ORDER — LEVOTHYROXINE SODIUM 25 MCG PO TABS
175.0000 ug | ORAL_TABLET | Freq: Every day | ORAL | Status: DC
  Administered 2022-08-11 – 2022-08-12 (×2): 175 ug via ORAL
  Filled 2022-08-10 (×2): qty 1

## 2022-08-10 MED ORDER — HEPARIN SODIUM (PORCINE) 5000 UNIT/ML IJ SOLN
5000.0000 [IU] | Freq: Three times a day (TID) | INTRAMUSCULAR | Status: DC
  Administered 2022-08-10 – 2022-08-12 (×5): 5000 [IU] via SUBCUTANEOUS
  Filled 2022-08-10 (×5): qty 1

## 2022-08-10 MED ORDER — FAMOTIDINE 20 MG PO TABS: 40.0000 mg | ORAL_TABLET | Freq: Every day | ORAL | Status: DC | PRN

## 2022-08-10 MED ORDER — SERTRALINE HCL 50 MG PO TABS
50.0000 mg | ORAL_TABLET | Freq: Every day | ORAL | Status: DC
  Administered 2022-08-10 – 2022-08-11 (×2): 50 mg via ORAL
  Filled 2022-08-10 (×2): qty 1

## 2022-08-10 NOTE — H&P (Signed)
History and Physical    Philip Humphrey IDP:[id number] DOB: 09/23/1961 DOA: 08/10/2022  PCP: Simona Huh, NP  Patient coming from: Home  Chief Complaint: Syncope  HPI: Philip Humphrey is a 61 y.o. male with medical history significant of hypertension, hyperlipidemia, hypothyroidism, alcohol use disorder, peripheral neuropathy, hereditary hemochromatosis, B12 deficiency, chronic thrombocytopenia, GERD, depression, anxiety, arthritis, colon polyp, history of medical noncompliance with leaving AMA presents to the ED for evaluation of lightheadedness, syncopal event, and diarrhea.  Orthostatics positive (systolic dropped from 443 supine to 70 standing).  Afebrile.  Labs showing no leukocytosis or anemia, platelet count improved, creatinine 2.3 (baseline 0.7-0.8), UA pending, blood ethanol level undetectable, high-sensitivity troponin negative.  CT head negative for acute finding.  He complained of right hand pain but x-ray was negative. Patient was given 2 L normal saline.  Patient states yesterday he was having watery, nonbloody diarrhea all day.  Today around lunchtime he took Imodium after which the diarrhea stopped.  He was able to eat a [name] meal for lunch which included burger and fries.  Denies fevers, chills nausea, vomiting, or abdominal pain.  No recent antibiotic use.  He does report feeling dizzy since his diarrhea started yesterday and today as he was walking to his garage, he felt dizzy again and thinks he passed out.  When he regained consciousness he felt short of breath while he was calling EMS.  Denies chest pain.  No longer feels short of breath.  Reports previously taking lisinopril which was stopped and he now takes olmesartan for hypertension.  Review of Systems:  Review of Systems  All other systems reviewed and are negative.   Past Medical History:  Diagnosis Date   Allergy    occ. seasonal   Anxiety    Arthritis    Colon polyp    Condyloma    anal    Depression    GERD (gastroesophageal reflux disease)    History of transfusion    as child   Hypertension    Knee injury    right knee cap w piece broken off- MRI done 02-06-15-pending surgery. 11-18-16 right knee is still painful, both shoulders(limited ROM right).   Knee pain    Neuropathy    bilateral- greater left   Palpitations    normal stress test 10 yrs ago   Pneumonia    Thyroid disease    Thyroid removed unintentionally at age 74- no further thyroid tissue remains-uses daily supplement    Past Surgical History:  Procedure Laterality Date   COLONOSCOPY     COLONOSCOPY N/A 02/15/2015   Procedure: COLONOSCOPY;  Surgeon: Alphonsa Overall, MD;  Location: [facility];  Service: General;  Laterality: N/A;   cyst removed     from neck / abdomed   EXAMINATION UNDER ANESTHESIA N/A 02/15/2015   Procedure: EXAM UNDER ANESTHESIA;  Surgeon: Pedro Earls, MD;  Location: [facility];  Service: General;  Laterality: N/A;   HERNIA REPAIR Left    10 yrs ago   KNEE ARTHROSCOPY Left    x1 and meniscal tear    ROTATOR CUFF REPAIR Right    x1   SHOULDER ARTHROSCOPY W/ ROTATOR CUFF REPAIR Left    THYROIDECTOMY  age 20    TOTAL HIP ARTHROPLASTY Left 11/24/2016   Procedure: LEFT TOTAL HIP ARTHROPLASTY ANTERIOR APPROACH;  Surgeon: Paralee Cancel, MD;  Location: [facility];  Service: Orthopedics;  Laterality: Left;   TOTAL HIP ARTHROPLASTY     Right hip  Dr. Alvan Dame 08/03/17  TOTAL HIP ARTHROPLASTY Right 08/03/2017   Procedure: RIGHT TOTAL HIP ARTHROPLASTY ANTERIOR APPROACH;  Surgeon: Paralee Cancel, MD;  Location: [facility];  Service: Orthopedics;  Laterality: Right;  70 mins   UMBILICAL HERNIA REPAIR N/A 10/18/2017   Procedure: Umbilical hernia repair  and excision of lipomas;  Surgeon: Johnathan Hausen, MD;  Location: [facility];  Service: General;  Laterality: N/A;   WART FULGURATION N/A 02/15/2015   Procedure: REMOVAL OF ANAL TAGS,CONDYLOMA;  Surgeon: Pedro Earls, MD;  Location: [facility];  Service: General;   Laterality: N/A;   WRIST SURGERY     x2- left (repair tendon/ ligament)     reports that he has quit smoking. His smoking use included cigars. He has never used smokeless tobacco. He reports current alcohol use. He reports that he does not use drugs.  Allergies  Allergen Reactions   Sulfa Antibiotics Anaphylaxis and Swelling    Mouth and throat swelling   Oxycodone Itching    Family History  Problem Relation Age of Onset   Heart disease Mother    Cancer Brother        Lung   Lung disease Brother    Stroke Father     Prior to Admission medications   Medication Sig Start Date End Date Taking? Authorizing Provider  atorvastatin (LIPITOR) 80 MG tablet Take 80 mg by mouth daily. 10/09/16  Yes [provider]  bismuth subsalicylate (PEPTO BISMOL) 262 MG/15ML suspension Take 30 mLs by mouth every 6 (six) hours as needed for indigestion (stomach pain).   Yes [provider]  calcium carbonate (TUMS - DOSED IN MG ELEMENTAL CALCIUM) 500 MG chewable tablet Chew 2 tablets by mouth at bedtime as needed for indigestion or heartburn.   Yes [provider]  Cyanocobalamin (B-12) 1000 MCG SUBL Place 1,000 mcg under the tongue daily. 07/26/22  Yes Dede Query T, PA-C  diclofenac Sodium (VOLTAREN) 1 % GEL Apply 1 application topically 2 (two) times daily as needed (pain).   Yes [provider]  diphenhydramine-acetaminophen (TYLENOL PM) 25-500 MG TABS tablet Take 1 tablet by mouth at bedtime as needed (sleep).   Yes [provider]  famotidine (PEPCID) 40 MG tablet Take 40 mg by mouth daily as needed for heartburn or indigestion. 01/25/21  Yes [provider]  folic acid (FOLVITE) 1 MG tablet Take 1 tablet (1 mg total) by mouth daily. 04/14/22  Yes Thurnell Lose, MD  gabapentin (NEURONTIN) 300 MG capsule Take 300-600 mg by mouth 2 (two) times daily. Take 1 capsule (300 mg) in the morning and Take 2 capsules (600 mg) at bedtime   Yes [provider]  hydrOXYzine (ATARAX/VISTARIL) 25 MG tablet Take 25 mg by mouth every 4 (four) hours as needed for anxiety. For itching 06/17/21  Yes [provider]  loperamide (IMODIUM) 1 MG/5ML solution Take 1 mg by mouth daily as needed for diarrhea or loose stools.   Yes [provider]  Magnesium 400 MG TABS Take 400 mg by mouth daily.   Yes [provider]  MELATONIN PO Take 1 tablet by mouth at bedtime as needed (sleep).   Yes [provider]  nitroGLYCERIN (NITROSTAT) 0.4 MG SL tablet Place 0.4 mg under the tongue every 5 (five) minutes as needed for chest pain.   Yes [provider]  olmesartan (BENICAR) 20 MG tablet Take 20 mg by mouth daily. 05/18/22  Yes [provider]  Olopatadine HCl (PATADAY OP) Place 1 drop into both  eyes 2 (two) times daily as needed (itching/allergies).   Yes [provider]  sertraline (ZOLOFT) 50 MG tablet Take 50 mg by mouth at bedtime.   Yes [provider]  SYNTHROID 175 MCG tablet Take 1 tablet (175 mcg total) by mouth daily. NO MORE REFILLS WITHOUT OFFICE VISIT - 2ND NOTICE Patient taking differently: Take 175 mcg by mouth daily. 11/14/14  Yes Leandrew Koyanagi, MD  thiamine 100 MG tablet Take 1 tablet (100 mg total) by mouth daily. 04/14/22  Yes Thurnell Lose, MD  traMADol (ULTRAM) 50 MG tablet Take 50 mg by mouth every 4 (four) hours as needed for moderate pain. 03/10/22  Yes [provider]  Vitamin D, Ergocalciferol, (DRISDOL) 1.25 MG (50000 UNIT) CAPS capsule Take 50,000 Units by mouth every 7 (seven) days. 05/14/21  Yes [provider]    Physical Exam: Vitals:   08/10/22 1821 08/10/22 1922 08/10/22 2012  BP: 94/61 (!) 144/96   Pulse: 90 94   Resp: 18 15   Temp: 98.7 F (37.1 C)  99 F (37.2 C)  TempSrc: Oral  Oral  SpO2: 100% 99%     Physical Exam Vitals reviewed.  Constitutional:      General: He is not in acute distress. HENT:     Head:  Normocephalic and atraumatic.  Eyes:     Extraocular Movements: Extraocular movements intact.  Cardiovascular:     Rate and Rhythm: Normal rate and regular rhythm.     Pulses: Normal pulses.  Pulmonary:     Effort: Pulmonary effort is normal. No respiratory distress.     Breath sounds: Normal breath sounds. No wheezing or rales.  Abdominal:     General: Bowel sounds are normal. There is no distension.     Palpations: Abdomen is soft.     Tenderness: There is no abdominal tenderness.  Musculoskeletal:        General: No swelling or tenderness.     Cervical back: Normal range of motion.  Skin:    General: Skin is warm and dry.  Neurological:     General: No focal deficit present.     Mental Status: He is alert and oriented to person, place, and time.      Labs on Admission: I have personally reviewed following labs and imaging studies  CBC: Recent Labs  Lab 08/10/22 1919  WBC 9.6  HGB 15.1  HCT 45.0  MCV 99.3  PLT 160*   Basic Metabolic Panel: Recent Labs  Lab 08/10/22 1919  NA 137  K 3.7  CL 100  CO2 27  GLUCOSE 91  BUN 20  CREATININE 2.34*  CALCIUM 10.4*   GFR: CrCl cannot be calculated (Unknown ideal weight.). Liver Function Tests: Recent Labs  Lab 08/10/22 1919  AST 39  ALT 32  ALKPHOS 54  BILITOT 0.2*  PROT 7.6  ALBUMIN 4.7   No results for input(s): "LIPASE", "AMYLASE" in the last 168 hours. No results for input(s): "AMMONIA" in the last 168 hours. Coagulation Profile: No results for input(s): "INR", "PROTIME" in the last 168 hours. Cardiac Enzymes: No results for input(s): "CKTOTAL", "CKMB", "CKMBINDEX", "TROPONINI" in the last 168 hours. BNP (last 3 results) No results for input(s): "PROBNP" in the last 8760 hours. HbA1C: No results for input(s): "HGBA1C" in the last 72 hours. CBG: Recent Labs  Lab 08/10/22 1838  GLUCAP 84   Lipid Profile: No results for input(s): "CHOL", "HDL", "LDLCALC", "TRIG", "CHOLHDL", "LDLDIRECT" in the last  72 hours. Thyroid  Function Tests: No results for input(s): "TSH", "T4TOTAL", "FREET4", "T3FREE", "THYROIDAB" in the last 72 hours. Anemia Panel: No results for input(s): "VITAMINB12", "FOLATE", "FERRITIN", "TIBC", "IRON", "RETICCTPCT" in the last 72 hours. Urine analysis:    Component Value Date/Time   COLORURINE YELLOW 04/13/2022 0852   APPEARANCEUR HAZY (A) 04/13/2022 0852   LABSPEC 1.015 04/13/2022 0852   PHURINE 5.0 04/13/2022 0852   GLUCOSEU NEGATIVE 04/13/2022 0852   HGBUR NEGATIVE 04/13/2022 0852   BILIRUBINUR NEGATIVE 04/13/2022 0852   KETONESUR 20 (A) 04/13/2022 0852   PROTEINUR 30 (A) 04/13/2022 0852   NITRITE NEGATIVE 04/13/2022 0852   LEUKOCYTESUR NEGATIVE 04/13/2022 0852    Radiological Exams on Admission: I have personally reviewed images DG Hand Complete Right  Result Date: 08/10/2022 CLINICAL DATA:  Pain in the right third and fourth digits. EXAM: RIGHT HAND - COMPLETE 3+ VIEW COMPARISON:  None Available. FINDINGS: There is no acute fracture or dislocation the bones are mildly osteopenic. No significant arthritic changes. The soft tissues are unremarkable. IMPRESSION: Negative. Electronically Signed   By: Anner Crete M.D.   On: 08/10/2022 19:19   CT HEAD WO CONTRAST (5MM)  Result Date: 08/10/2022 CLINICAL DATA:  Head trauma. EXAM: CT HEAD WITHOUT CONTRAST TECHNIQUE: Contiguous axial images were obtained from the base of the skull through the vertex without intravenous contrast. RADIATION DOSE REDUCTION: This exam was performed according to the departmental dose-optimization program which includes automated exposure control, adjustment of the mA and/or kV according to patient size and/or use of iterative reconstruction technique. COMPARISON:  Head CT dated 04/12/2022. FINDINGS: Brain: The ventricles and sulci are appropriate size for the patient's age. The gray-white matter discrimination is preserved. There is no acute intracranial hemorrhage. No mass effect or  midline shift. No extra-axial fluid collection. Vascular: No hyperdense vessel or unexpected calcification. Skull: Normal. Negative for fracture or focal lesion. Sinuses/Orbits: No acute finding. Other: None IMPRESSION: No acute intracranial pathology. Electronically Signed   By: Anner Crete M.D.   On: 08/10/2022 19:15    EKG: Independently reviewed.  Sinus rhythm, no significant change since prior tracing.  Assessment and Plan  Syncope Orthostatic hypotension Likely secondary to severe dehydration from diarrhea.  Takes olmesartan.  Orthostatics were positive in the ED with systolic dropping from 809 supine to 70 standing.  ACS less likely as high-sensitivity troponin negative and not endorsing chest pain.  Also PE less likely given no tachycardia or hypoxia.  CT head negative for acute finding and no focal neurodeficit on exam. -Hold olmesartan -IV fluid hydration -Cardiac monitoring -Repeat orthostatics in a.m. -Fall precautions  Diarrhea Resolved after he took Imodium this afternoon.  No abdominal pain or tenderness.  C. difficile less likely given no fever, leukocytosis, or recent antibiotic use. -Continue to monitor, GI pathogen panel if there is recurrence of diarrhea -Enteric precautions  AKI Likely prerenal from severe dehydration.  Creatinine currently 2.3, baseline 0.7-0.8. -IV fluid hydration -Monitor renal function and urine output -Avoid nephrotoxic agents/hold home olmesartan  Alcohol use disorder No signs of withdrawal at this time. -CIWA protocol; Ativan as needed -Thiamine, folate, multivitamin  Hyperlipidemia -Continue Lipitor  Hypothyroidism -Continue Synthroid  Peripheral neuropathy -Continue gabapentin  Hereditary hemochromatosis Hemoglobin 15.1.  Per recent hematology office visit note from 8/4, therapeutic phlebotomy not recommended since ferritin was normal. -Outpatient hematology follow-up  Depression and anxiety -Continue home Zoloft,  hydroxyzine prn  GERD -Continue Pepcid prn  B12 deficiency -Continue home supplement  DVT prophylaxis: SQ Heparin Code Status: Full Code (discussed  with the patient) Family Communication: No family available at this time. Level of care: Telemetry bed Admission status: It is my clinical opinion that referral for OBSERVATION is reasonable and necessary in this patient based on the above information provided. The aforementioned taken together are felt to place the patient at high risk for further clinical deterioration. However, it is anticipated that the patient may be medically stable for discharge from the hospital within 24 to 48 hours.   Shela Leff MD [facility]  If 7PM-7AM, please contact night-coverage [email or web address]  08/10/2022, 9:30 PM

## 2022-08-10 NOTE — ED Triage Notes (Signed)
Pt BIB EMS from home. Pt recently stopped taking lisinopril due to low BP. Pt has recently started a new BP but has been drinking a lot of whiskey. Pt endorses dizziness.   18G LAC 521m NS  100/56 HR 94 98% RA CBG 112

## 2022-08-10 NOTE — ED Provider Notes (Signed)
[facility] DEPT Provider Note   CSN: [id number] Arrival date & time: 08/10/22  1814     History  Chief Complaint  Patient presents with   Dizziness    BLAIN HUNSUCKER is a 61 y.o. male.  Patient here with lightheadedness, syncopal event, has had bad diarrhea the last 2 days.  He has had low blood pressure at home.  He denies any recent antibiotic.  No chest pain or shortness of breath.  He is feeling better at rest currently.  He injured his right hand and hit his head when he fell.  He has no other extremity pain.  No neck pain.  He took Imodium today which is helped his diarrhea.  He has been drinking alcohol heavily recently.  He last drank at 3 AM.  He used to be on blood pressure medicine but he does not think he is on any anymore.  The history is provided by the patient.       Home Medications Prior to Admission medications   Medication Sig Start Date End Date Taking? Authorizing Provider  atorvastatin (LIPITOR) 80 MG tablet Take 80 mg by mouth daily. 10/09/16   [provider]  bismuth subsalicylate (PEPTO BISMOL) 262 MG/15ML suspension Take 30 mLs by mouth every 6 (six) hours as needed for indigestion (stomach pain).    [provider]  calcium carbonate (TUMS - DOSED IN MG ELEMENTAL CALCIUM) 500 MG chewable tablet Chew 2 tablets by mouth at bedtime as needed for indigestion or heartburn.    [provider]  Cyanocobalamin (B-12) 1000 MCG SUBL Place 1,000 mcg under the tongue daily. 07/26/22   Lincoln Brigham, PA-C  diclofenac Sodium (VOLTAREN) 1 % GEL Apply 1 application topically 2 (two) times daily as needed (pain).    [provider]  diphenhydramine-acetaminophen (TYLENOL PM) 25-500 MG TABS tablet Take 1 tablet by mouth at bedtime as needed (sleep).    [provider]  famotidine (PEPCID) 40 MG tablet Take 40 mg by mouth daily as needed for heartburn or indigestion. 01/25/21   [provider]  folic acid (FOLVITE) 1 MG tablet Take 1 tablet (1 mg total) by mouth daily. 04/14/22   Thurnell Lose, MD  gabapentin (NEURONTIN) 300 MG capsule Take 600 mg by mouth 2 (two) times daily. Pt currently taking 600 mg BID    [provider]  hydrOXYzine (ATARAX/VISTARIL) 25 MG tablet Take 25 mg by mouth every 4 (four) hours as needed. For itching 06/17/21   [provider]  Magnesium 400 MG TABS Take 400 mg by mouth daily.    [provider]  MELATONIN PO Take 1 tablet by mouth at bedtime.    [provider]  nitroGLYCERIN (NITROSTAT) 0.4 MG SL tablet Place 0.4 mg under the tongue every 5 (five) minutes as needed for chest pain.    [provider]  olmesartan (BENICAR) 20 MG tablet Take 20 mg by mouth daily. 05/18/22   [provider]  Olopatadine HCl (PATADAY OP) Place 1 drop into both eyes 2 (two) times daily as needed (itching/allergies).    [provider]  SYNTHROID 175 MCG tablet Take 1 tablet (175 mcg total) by mouth daily. NO MORE REFILLS WITHOUT OFFICE VISIT - 2ND NOTICE Patient taking differently: Take 175 mcg by mouth daily. 11/14/14   Leandrew Koyanagi, MD  thiamine 100 MG tablet Take 1 tablet (100 mg total) by mouth daily. 04/14/22   Thurnell Lose, MD  traMADol (ULTRAM) 50 MG tablet Take 50 mg by mouth every 4 (four) hours as needed for moderate pain. 03/10/22   [provider]  Vitamin D, Ergocalciferol, (DRISDOL) 1.25 MG (50000 UNIT) CAPS capsule Take 50,000 Units by mouth every 7 (seven) days. 05/14/21   [provider]      Allergies    Sulfa antibiotics and Oxycodone    Review of Systems   Review of Systems  Physical Exam Updated Vital Signs BP (!) 144/96   Pulse 94   Temp 99 F (37.2 C) (Oral)   Resp 15   SpO2 99%  Physical Exam Vitals and nursing note reviewed.  Constitutional:      General: He is not in acute distress.    Appearance: He is well-developed. He is not  ill-appearing.  HENT:     Head: Normocephalic and atraumatic.     Nose: Nose normal.     Mouth/Throat:     Mouth: Mucous membranes are moist.  Eyes:     Extraocular Movements: Extraocular movements intact.     Conjunctiva/sclera: Conjunctivae normal.     Pupils: Pupils are equal, round, and reactive to light.  Cardiovascular:     Rate and Rhythm: Normal rate and regular rhythm.     Pulses: Normal pulses.     Heart sounds: Normal heart sounds. No murmur heard. Pulmonary:     Effort: Pulmonary effort is normal. No respiratory distress.     Breath sounds: Normal breath sounds.  Abdominal:     Palpations: Abdomen is soft.     Tenderness: There is no abdominal tenderness.  Musculoskeletal:        General: No swelling.     Cervical back: Normal range of motion and neck supple.  Skin:    General: Skin is warm and dry.     Capillary Refill: Capillary refill takes less than 2 seconds.  Neurological:     General: No focal deficit present.     Mental Status: He is alert.  Psychiatric:        Mood and Affect: Mood normal.     ED Results / Procedures / Treatments   Labs (all labs ordered are listed, but only abnormal results are displayed) Labs Reviewed  CBC - Abnormal; Notable for the following components:      Result Value   Platelets 149 (*)    All other components within normal limits  COMPREHENSIVE METABOLIC PANEL - Abnormal; Notable for the following components:   Creatinine, Ser 2.34 (*)    Calcium 10.4 (*)    Total Bilirubin 0.2 (*)    GFR, Estimated 31 (*)    All other components within normal limits  ETHANOL  URINALYSIS, ROUTINE W REFLEX MICROSCOPIC  CBG MONITORING, ED  TROPONIN I (HIGH SENSITIVITY)    EKG EKG Interpretation  Date/Time:  Monday August 10 2022 18:36:18 EDT Ventricular Rate:  92 PR Interval:  147 QRS Duration: 86 QT Interval:  384 QTC Calculation: 475 R Axis:   5 Text Interpretation: Sinus rhythm Borderline T wave abnormalities Baseline  wander in lead(s) II Confirmed by Lennice Sites ([id number]) on 08/10/2022 8:14:52 PM  Radiology DG Hand Complete Right  Result Date: 08/10/2022 CLINICAL DATA:  Pain in the right third and fourth digits. EXAM: RIGHT HAND - COMPLETE 3+ VIEW COMPARISON:  None Available. FINDINGS: There is no acute fracture or dislocation the bones are mildly osteopenic. No significant arthritic changes. The soft tissues are unremarkable. IMPRESSION: Negative. Electronically Signed   By: Milas Hock  Radparvar M.D.   On: 08/10/2022 19:19   CT HEAD WO CONTRAST (5MM)  Result Date: 08/10/2022 CLINICAL DATA:  Head trauma. EXAM: CT HEAD WITHOUT CONTRAST TECHNIQUE: Contiguous axial images were obtained from the base of the skull through the vertex without intravenous contrast. RADIATION DOSE REDUCTION: This exam was performed according to the departmental dose-optimization program which includes automated exposure control, adjustment of the mA and/or kV according to patient size and/or use of iterative reconstruction technique. COMPARISON:  Head CT dated 04/12/2022. FINDINGS: Brain: The ventricles and sulci are appropriate size for the patient's age. The gray-white matter discrimination is preserved. There is no acute intracranial hemorrhage. No mass effect or midline shift. No extra-axial fluid collection. Vascular: No hyperdense vessel or unexpected calcification. Skull: Normal. Negative for fracture or focal lesion. Sinuses/Orbits: No acute finding. Other: None IMPRESSION: No acute intracranial pathology. Electronically Signed   By: Anner Crete M.D.   On: 08/10/2022 19:15    Procedures Procedures    Medications Ordered in ED Medications  sodium chloride 0.9 % bolus 2,000 mL (has no administration in time range)    ED Course/ Medical Decision Making/ A&P                           Medical Decision Making Amount and/or Complexity of Data Reviewed Labs: ordered.  Risk Decision regarding hospitalization.   Maguire Sime  Reynoso is here after syncopal event.  Normal vitals.  No fever.  Orthostatics done in the room by myself were positive.  Patient went from 423 systolic to 70 systolic from lying to standing.  He has been having a lot of diarrhea the last 2 days every hour.  He states he took some Imodium this morning and that has improved.  He states when he was walking down a flight of stairs he passed out hit his head and hurt his right hand.  He has been heavily drinking as well.  Denies any chest pain or shortness of breath.  No black or bloody stools.  He is not on blood thinners.  Recently stopped blood pressure medications due to low blood pressure.  Overall given his positive orthostatics suspect that he is significantly dehydrated from his diarrheal process.  Likely foodborne/viral process.  We will get CBC, BMP, hepatic function panel, CT head, and x-ray.  Will give 2 L fluid bolus.  Per my review and interpretation of labs there is no significant elevation in troponin.  His creatinine is elevated 2.34.  Otherwise no significant anemia or electrolyte abnormality.  Head CT is unremarkable.  An x-ray is unremarkable.  Overall we will admit for dehydration and AKI and orthostatic hypotension.  His baseline creatinine is less than 1.  Patient admitted to medicine.  This chart was dictated using voice recognition software.  Despite best efforts to proofread,  errors can occur which can change the documentation meaning.         Final Clinical Impression(s) / ED Diagnoses Final diagnoses:  AKI (acute kidney injury) ([place])  Orthostatic hypotension    Rx / DC Orders ED Discharge Orders     None         Lennice Sites, DO 08/10/22 2035

## 2022-08-10 NOTE — ED Provider Triage Note (Signed)
Emergency Medicine Provider Triage Evaluation Note  Philip Humphrey , a 61 y.o. male  was evaluated in triage.  Pt complains of diarrhea, fatigue and states he's been light headed and felt unwell. States he took imodium today and diarrhea has improved. He states he feels dehydrated.   No CP or SOB.   Was given some fluids via IV by EMS.   VERY vague about syncope but states he maybe passed out today.   Review of Systems  Positive: Diarrhea, fatigue, etoh use Negative: Fever   Physical Exam  BP 94/61 (BP Location: Right Arm)   Pulse 90   Temp 98.7 F (37.1 C) (Oral)   Resp 18   SpO2 100%  Gen:   Awake, no distress   Resp:  Normal effort  MSK:   Moves extremities without difficulty  Other:  Abd soft. Head atraumatic, fingers of R hand taped.   Medical Decision Making  Medically screening exam initiated at 6:53 PM.  Appropriate orders placed.  Philip Humphrey was informed that the remainder of the evaluation will be completed by another provider, this initial triage assessment does not replace that evaluation, and the importance of remaining in the ED until their evaluation is complete.  Labs, xrays.   Syncope ? EKG and CT head/trop added   Philip Humphrey, [place] 08/10/22 1858

## 2022-08-11 ENCOUNTER — Telehealth: Payer: Self-pay | Admitting: *Deleted

## 2022-08-11 ENCOUNTER — Encounter (HOSPITAL_COMMUNITY): Payer: Self-pay | Admitting: Internal Medicine

## 2022-08-11 DIAGNOSIS — E890 Postprocedural hypothyroidism: Secondary | ICD-10-CM | POA: Diagnosis present

## 2022-08-11 DIAGNOSIS — E538 Deficiency of other specified B group vitamins: Secondary | ICD-10-CM | POA: Diagnosis present

## 2022-08-11 DIAGNOSIS — Z79890 Hormone replacement therapy (postmenopausal): Secondary | ICD-10-CM | POA: Diagnosis not present

## 2022-08-11 DIAGNOSIS — Z885 Allergy status to narcotic agent status: Secondary | ICD-10-CM | POA: Diagnosis not present

## 2022-08-11 DIAGNOSIS — F1010 Alcohol abuse, uncomplicated: Secondary | ICD-10-CM | POA: Diagnosis present

## 2022-08-11 DIAGNOSIS — F32A Depression, unspecified: Secondary | ICD-10-CM | POA: Diagnosis present

## 2022-08-11 DIAGNOSIS — Z96643 Presence of artificial hip joint, bilateral: Secondary | ICD-10-CM | POA: Diagnosis present

## 2022-08-11 DIAGNOSIS — Z8249 Family history of ischemic heart disease and other diseases of the circulatory system: Secondary | ICD-10-CM | POA: Diagnosis not present

## 2022-08-11 DIAGNOSIS — Z23 Encounter for immunization: Secondary | ICD-10-CM | POA: Diagnosis not present

## 2022-08-11 DIAGNOSIS — I951 Orthostatic hypotension: Secondary | ICD-10-CM | POA: Diagnosis present

## 2022-08-11 DIAGNOSIS — E785 Hyperlipidemia, unspecified: Secondary | ICD-10-CM | POA: Diagnosis present

## 2022-08-11 DIAGNOSIS — Z882 Allergy status to sulfonamides status: Secondary | ICD-10-CM | POA: Diagnosis not present

## 2022-08-11 DIAGNOSIS — R55 Syncope and collapse: Secondary | ICD-10-CM | POA: Diagnosis not present

## 2022-08-11 DIAGNOSIS — R197 Diarrhea, unspecified: Secondary | ICD-10-CM | POA: Diagnosis present

## 2022-08-11 DIAGNOSIS — Z79899 Other long term (current) drug therapy: Secondary | ICD-10-CM | POA: Diagnosis not present

## 2022-08-11 DIAGNOSIS — N179 Acute kidney failure, unspecified: Secondary | ICD-10-CM | POA: Diagnosis present

## 2022-08-11 DIAGNOSIS — G629 Polyneuropathy, unspecified: Secondary | ICD-10-CM | POA: Diagnosis present

## 2022-08-11 DIAGNOSIS — I10 Essential (primary) hypertension: Secondary | ICD-10-CM | POA: Diagnosis present

## 2022-08-11 DIAGNOSIS — Z87891 Personal history of nicotine dependence: Secondary | ICD-10-CM | POA: Diagnosis not present

## 2022-08-11 DIAGNOSIS — E860 Dehydration: Secondary | ICD-10-CM | POA: Diagnosis present

## 2022-08-11 DIAGNOSIS — F419 Anxiety disorder, unspecified: Secondary | ICD-10-CM | POA: Diagnosis present

## 2022-08-11 DIAGNOSIS — K219 Gastro-esophageal reflux disease without esophagitis: Secondary | ICD-10-CM | POA: Diagnosis present

## 2022-08-11 LAB — CBC
HCT: 35.7 % — ABNORMAL LOW (ref 39.0–52.0)
Hemoglobin: 12.1 g/dL — ABNORMAL LOW (ref 13.0–17.0)
MCH: 33.7 pg (ref 26.0–34.0)
MCHC: 33.9 g/dL (ref 30.0–36.0)
MCV: 99.4 fL (ref 80.0–100.0)
Platelets: 116 10*3/uL — ABNORMAL LOW (ref 150–400)
RBC: 3.59 MIL/uL — ABNORMAL LOW (ref 4.22–5.81)
RDW: 14.4 % (ref 11.5–15.5)
WBC: 6.9 10*3/uL (ref 4.0–10.5)
nRBC: 0 % (ref 0.0–0.2)

## 2022-08-11 LAB — C DIFFICILE QUICK SCREEN W PCR REFLEX
C Diff antigen: NEGATIVE
C Diff interpretation: NOT DETECTED
C Diff toxin: NEGATIVE

## 2022-08-11 LAB — BASIC METABOLIC PANEL
Anion gap: 5 (ref 5–15)
BUN: 17 mg/dL (ref 8–23)
CO2: 23 mmol/L (ref 22–32)
Calcium: 8.8 mg/dL — ABNORMAL LOW (ref 8.9–10.3)
Chloride: 111 mmol/L (ref 98–111)
Creatinine, Ser: 1.49 mg/dL — ABNORMAL HIGH (ref 0.61–1.24)
GFR, Estimated: 53 mL/min — ABNORMAL LOW (ref >60)
Glucose, Bld: 92 mg/dL (ref 70–99)
Potassium: 3.4 mmol/L — ABNORMAL LOW (ref 3.5–5.1)
Sodium: 139 mmol/L (ref 135–145)

## 2022-08-11 MED ORDER — LOPERAMIDE HCL 2 MG PO CAPS
2.0000 mg | ORAL_CAPSULE | Freq: Once | ORAL | Status: AC
  Administered 2022-08-11: 2 mg via ORAL
  Filled 2022-08-11: qty 1

## 2022-08-11 MED ORDER — POTASSIUM CHLORIDE CRYS ER 20 MEQ PO TBCR
40.0000 meq | EXTENDED_RELEASE_TABLET | Freq: Once | ORAL | Status: AC
  Administered 2022-08-11: 40 meq via ORAL
  Filled 2022-08-11: qty 2

## 2022-08-11 MED ORDER — PHENOL 1.4 % MT LIQD
1.0000 | OROMUCOSAL | Status: DC | PRN
  Filled 2022-08-11: qty 177

## 2022-08-11 MED ORDER — GABAPENTIN 300 MG PO CAPS
600.0000 mg | ORAL_CAPSULE | Freq: Every day | ORAL | Status: DC
  Administered 2022-08-11 (×2): 600 mg via ORAL
  Filled 2022-08-11 (×2): qty 2

## 2022-08-11 MED ORDER — PNEUMOCOCCAL 20-VAL CONJ VACC 0.5 ML IM SUSY
0.5000 mL | PREFILLED_SYRINGE | INTRAMUSCULAR | Status: AC
  Administered 2022-08-12: 0.5 mL via INTRAMUSCULAR
  Filled 2022-08-11: qty 0.5

## 2022-08-11 MED ORDER — SODIUM CHLORIDE 0.9 % IV SOLN
INTRAVENOUS | Status: AC
Start: 2022-08-11 — End: 2022-08-12

## 2022-08-11 NOTE — TOC Initial Note (Addendum)
Transition of Care [facility]) - Initial/Assessment Note    Patient Details  Name: Philip Humphrey MRN: [id number] Date of Birth: 10/30/1961  [facility]) CM/SW Contact:    Vassie Moselle, LCSW Phone Number: 08/11/2022, 12:01 PM  Clinical Narrative:                 [facility] consulted for SA counseling and resources. CSW met with pt at bedside to discuss use and interest in resources. Pt shares that his wife passed away 4 years ago this month. He states he has been depressed and still grieving the loss if his wife. He reports he had been receiving substance use counseling from [facility] however, he was doing well and stopped going as he felt he no longer needed these services. He has never been to a rehab program. He did receive a few grief counseling sessions however, did not find these helpful and stopped attending. Pt does share that he is open to returning to [facility] or another agency for substance use counseling and is open to have resources added to his discharge paperwork. CSW added SA resources for outpatient and residential treatment program to AVS. Pt's daughter came into room and joined the conversation and reports that she has called [facility] who have bed availability in their rehab program. When this was brought up pt quickly switched the subject to his wanting imodium. When CSW attempted to bring conversation back to interest in residential treatment pt states he had to use the bathroom.   CSW spoke with daughter in waiting area with another friend she had present with her who is also a Education officer, museum. She shares that this is pt's 5th hospital admission for similar complaints and all stemming from his alcohol use. She states he will do well for a month or so and then will all of a sudden begin binge drinking again. He minimizes his use and somatizes his symptoms rather than acknowledge the alcohol uses role in his physical health decline. She is concerned that pt will  end up dying due to his binge drinking. She and her 2y.o. son currently live with him and she has attempted to inhibit his drinking by pouring out alcohol and hiding his wallet for a time. She states he has been isolating and has no supports. She says he will make statements that he wants to see his grandson grow up but, his actions say otherwise. She states that since her mother passed away he has lost his will to live. She is interested in pt going to rehab and is going to have a heart to heart talk with him. She is interested in pt being evaluated for a substance use IVC if he does not agree to go voluntarily.   CSW will follow up with pt and daughter once they have had a chance to discuss treatment together.   Update 1400: CSW spoke with pt's daughter following her conversation with her father regarding treatment. She states that her father continues to make excuses as to why he cannot go into rehab. These excuses include not wanting to miss his grandsons birthday on Saturday and having scheduled medical appointments for next week. He also states that he does not "believe in [facility]." She states that he is passively suicidal making statement that he no longer "has the fight left in me." And making statements that he does not care to live or die. She states he is avoiding his feelings and turns to  alcohol to numb them.  Expected Discharge Plan: Home/Self Care Barriers to Discharge: Continued Medical Work up   Patient Goals and CMS Choice Patient states their goals for this hospitalization and ongoing recovery are:: "To go home"   Choice offered to / list presented to : Patient, Adult Children  Expected Discharge Plan and Services Expected Discharge Plan: Home/Self Care In-house Referral: Clinical Social Work Discharge Planning Services: CM Consult Post Acute Care Choice: NA Living arrangements for the past 2 months: Single Family Home                 DME Arranged: N/A DME Agency:  NA                  Prior Living Arrangements/Services Living arrangements for the past 2 months: Single Family Home Lives with:: Adult Children Patient language and need for interpreter reviewed:: Yes Do you feel safe going back to the place where you live?: Yes      Need for Family Participation in Patient Care: No (Comment) Care giver support system in place?: No (comment) Current home services: DME Criminal Activity/Legal Involvement Pertinent to Current Situation/Hospitalization: No - Comment as needed  Activities of Daily Living Home Assistive Devices/Equipment: Eyeglasses, Cane (specify quad or straight), Walker (specify type) (shower chair) ADL Screening (condition at time of admission) Patient's cognitive ability adequate to safely complete daily activities?: Yes Is the patient deaf or have difficulty hearing?: Yes Does the patient have difficulty seeing, even when wearing glasses/contacts?: Yes (related to dizziness) Does the patient have difficulty concentrating, remembering, or making decisions?: Yes (trouble concentrating) Patient able to express need for assistance with ADLs?: Yes Does the patient have difficulty dressing or bathing?: Yes Independently performs ADLs?: No Communication: Independent Dressing (OT): Needs assistance Is this a change from baseline?: Change from baseline, expected to last <3days Grooming: Independent Feeding: Independent Bathing: Needs assistance Is this a change from baseline?: Change from baseline, expected to last <3 days Toileting: Needs assistance Is this a change from baseline?: Change from baseline, expected to last <3 days In/Out Bed: Needs assistance Is this a change from baseline?: Change from baseline, expected to last <3 days Walks in Home: Needs assistance Is this a change from baseline?: Change from baseline, expected to last <3 days Does the patient have difficulty walking or climbing stairs?: Yes Weakness of Legs:  Both (both legsstarted hurting real bad yesterday) Weakness of Arms/Hands: Both  Permission Sought/Granted Permission sought to share information with : Family Supports Permission granted to share information with : Yes, Verbal Permission Granted  Share Information with NAME: Christena Deem     Permission granted to share info w Relationship: Daughter  Permission granted to share info w Contact Information: [phone]  Emotional Assessment Appearance:: Appears stated age Attitude/Demeanor/Rapport: Avoidant, Charismatic Affect (typically observed): Defensive, Depressed Orientation: : Oriented to Self, Oriented to Place, Oriented to  Time, Oriented to Situation Alcohol / Substance Use: Alcohol Use Psych Involvement: No (comment)  Admission diagnosis:  Orthostatic hypotension [I95.1] Syncope [R55] AKI (acute kidney injury) ([place]) [N17.9] Patient Active Problem List   Diagnosis Date Noted   Syncope 08/10/2022   Orthostatic hypotension 08/10/2022   Diarrhea 08/10/2022   Alcohol use disorder 08/10/2022   HLD (hyperlipidemia) 08/10/2022   Peripheral neuropathy 08/10/2022   Fall 04/12/2022   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   AKI (acute kidney injury) ([place]) 03/03/2022   Hyponatremia 03/03/2022   Hypokalemia 17/51/0258   Acute metabolic acidosis 52/77/8242   Elevated LFTs 03/03/2022  Macrocytosis 03/03/2022   Thrombocytopenia ([place]) 03/03/2022   Other hemochromatosis 07/17/2021   Alcohol abuse 07/07/2021   Pancreatitis 07/07/2021   Acute kidney injury ([place]) 07/07/2021   Acute pancreatitis 07/07/2021   Status post total hip replacement, 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 [place] [address] Phone: [phone] Fax: [phone]     Social Determinants of Health (SDOH) Interventions    Readmission Risk Interventions     No data to display

## 2022-08-11 NOTE — Progress Notes (Addendum)
PROGRESS NOTE  Philip ERICSSON MCN:[id number] DOB: Jun 05, 1961 DOA: 08/10/2022 PCP: Simona Huh, NP   LOS: 0 days   Brief Narrative / Interim history: 61 y.o. male with medical history significant of hypertension, hyperlipidemia, hypothyroidism, alcohol use disorder, peripheral neuropathy, hereditary hemochromatosis, B12 deficiency, chronic thrombocytopenia, GERD, depression, anxiety, arthritis, colon polyp, history of medical noncompliance with leaving AMA presents to the ED for evaluation of lightheadedness, syncopal event, and diarrhea.   Pertinent data: Head CT 8/21-no acute intracranial pathology Right hand x-ray 8/21-negative C. difficile 8/22-negative  GI pathogen panel-pending  Subjective / 24h Interval events: Feeling a little bit better, still some weakness but improved compared to yesterday  Assesement and Plan: Principal Problem:   Syncope Active Problems:   AKI (acute kidney injury) ([place])   Hypothyroidism   Hemochromatosis type 1 ([facility])   Orthostatic hypotension   Diarrhea   Alcohol use disorder   HLD (hyperlipidemia)   Peripheral neuropathy   Principal problem Syncope, orthostatic hypotension-on admission patient's blood pressure dropped into the 70s upon standing, and he was quite symptomatic.  Repeat orthostatic vital signs are somewhat improved but still drops into the 62H systolic.  Continue IV fluid.    Active problems Acute kidney injury-prerenal from severe dehydration, creatinine 2.3 on admission.  Improved to 1.4, still not at baseline, still continues to have diarrhea.  Recheck renal function tomorrow morning  Diarrhea-he tells me he has been drinking up until 3 AM the night prior, I wonder whether it is related to that, no other clear-cut triggers.  Resolved after he took Imodium, but this morning he started to have watery diarrhea again.  C. difficile negative.  GI pathogen panel pending  Hyperlipidemia-continue Lipitor  Hypothyroidism-continue  Synthroid  Hereditary hemochromatosis-outpatient follow-up.  Her recent hematology office visit note from 8/4, therapeutic phlebotomy was not recommended since ferritin was normal.  Patient very insistent that all his symptoms currently are due to his hemochromatosis as he did not have phlebotomy when he saw oncology August 4.  He tells me that he has all the symptoms of iron poisoning with diarrhea, syncope, weakness.  He asks me to contact Dr. Chryl Heck, I have sent a message  Alcohol use disorder, depression, hopelessness-no withdrawals, continue CIWA.  Her daughter he has been having feelings of hopelessness, and somewhat passive suicidal ideation.  No current intent of self-harm.  Psychiatry consulted, will see tomorrow  Scheduled Meds:  atorvastatin  80 mg Oral Daily   cyanocobalamin  1,000 mcg Oral Daily   folic acid  1 mg Oral Daily   gabapentin  300 mg Oral Daily   gabapentin  600 mg Oral QHS   heparin  5,000 Units Subcutaneous Q8H   levothyroxine  175 mcg Oral Q0600   multivitamin with minerals  1 tablet Oral Daily   [START ON 08/12/2022] pneumococcal 20-valent conjugate vaccine  0.5 mL Intramuscular Tomorrow-1000   sertraline  50 mg Oral QHS   thiamine  100 mg Oral Daily   Or   thiamine  100 mg Intravenous Daily   Continuous Infusions:  sodium chloride     PRN Meds:.acetaminophen **OR** acetaminophen, famotidine, hydrOXYzine, LORazepam **OR** LORazepam, phenol  Diet Orders (From admission, onward)     Start     Ordered   08/10/22 2200  Diet Heart Room service appropriate? Yes; Fluid consistency: Thin  Diet effective now       Question Answer Comment  Room service appropriate? Yes   Fluid consistency: Thin      08/10/22 2204  DVT prophylaxis: heparin injection 5,000 Units Start: 08/10/22 2215   Lab Results  Component Value Date   PLT 116 (L) 08/11/2022      Code Status: Full Code  Family Communication: no family at bedside   Status is:  Observation  The patient will require care spanning > 2 midnights and should be moved to inpatient because: Persistent diarrhea, AKI   Level of care: Telemetry  Consultants:  Psychiatry   Objective: Vitals:   08/11/22 0917 08/11/22 0921 08/11/22 1312 08/11/22 1314  BP: 114/73 91/65 124/89 112/74  Pulse: 86 82 86 84  Resp:      Temp:    98.2 F (36.8 C)  TempSrc:    Oral  SpO2:   99% 100%  Weight:      Height:        Intake/Output Summary (Last 24 hours) at 08/11/2022 1437 Last data filed at 08/11/2022 1134 Gross per 24 hour  Intake 3713.77 ml  Output 140 ml  Net 3573.77 ml   Wt Readings from Last 3 Encounters:  08/11/22 91 kg  07/24/22 96 kg  05/21/22 91.4 kg    Examination:  Constitutional: NAD Eyes: no scleral icterus ENMT: Mucous membranes are moist.  Neck: normal, supple Respiratory: clear to auscultation bilaterally, no wheezing, no crackles. Normal respiratory effort. No accessory muscle use.  Cardiovascular: Regular rate and rhythm, no murmurs / rubs / gallops. No LE edema.  Abdomen: non distended, no tenderness. Bowel sounds positive.  Musculoskeletal: no clubbing / cyanosis.  Skin: no rashes Neurologic: non focal   Data Reviewed: I have independently reviewed following labs and imaging studies   CBC Recent Labs  Lab 08/10/22 1919 08/11/22 0534  WBC 9.6 6.9  HGB 15.1 12.1*  HCT 45.0 35.7*  PLT 149* 116*  MCV 99.3 99.4  MCH 33.3 33.7  MCHC 33.6 33.9  RDW 14.6 14.4    Recent Labs  Lab 08/10/22 1919 08/11/22 0534  NA 137 139  K 3.7 3.4*  CL 100 111  CO2 27 23  GLUCOSE 91 92  BUN 20 17  CREATININE 2.34* 1.49*  CALCIUM 10.4* 8.8*  AST 39  --   ALT 32  --   ALKPHOS 54  --   BILITOT 0.2*  --   ALBUMIN 4.7  --     ------------------------------------------------------------------------------------------------------------------ No results for input(s): "CHOL", "HDL", "LDLCALC", "TRIG", "CHOLHDL", "LDLDIRECT" in the last 72  hours.  No results found for: "HGBA1C" ------------------------------------------------------------------------------------------------------------------ No results for input(s): "TSH", "T4TOTAL", "T3FREE", "THYROIDAB" in the last 72 hours.  Invalid input(s): "FREET3"  Cardiac Enzymes No results for input(s): "CKMB", "TROPONINI", "MYOGLOBIN" in the last 168 hours.  Invalid input(s): "CK" ------------------------------------------------------------------------------------------------------------------    Component Value Date/Time   BNP 58.3 04/14/2022 0244    CBG: Recent Labs  Lab 08/10/22 1838  GLUCAP 84    No results found for this or any previous visit (from the past 240 hour(s)).   Radiology Studies: DG Hand Complete Right  Result Date: 08/10/2022 CLINICAL DATA:  Pain in the right third and fourth digits. EXAM: RIGHT HAND - COMPLETE 3+ VIEW COMPARISON:  None Available. FINDINGS: There is no acute fracture or dislocation the bones are mildly osteopenic. No significant arthritic changes. The soft tissues are unremarkable. IMPRESSION: Negative. Electronically Signed   By: Anner Crete M.D.   On: 08/10/2022 19:19   CT HEAD WO CONTRAST (5MM)  Result Date: 08/10/2022 CLINICAL DATA:  Head trauma. EXAM: CT HEAD WITHOUT CONTRAST TECHNIQUE: Contiguous axial images were obtained  from the base of the skull through the vertex without intravenous contrast. RADIATION DOSE REDUCTION: This exam was performed according to the departmental dose-optimization program which includes automated exposure control, adjustment of the mA and/or kV according to patient size and/or use of iterative reconstruction technique. COMPARISON:  Head CT dated 04/12/2022. FINDINGS: Brain: The ventricles and sulci are appropriate size for the patient's age. The gray-white matter discrimination is preserved. There is no acute intracranial hemorrhage. No mass effect or midline shift. No extra-axial fluid collection.  Vascular: No hyperdense vessel or unexpected calcification. Skull: Normal. Negative for fracture or focal lesion. Sinuses/Orbits: No acute finding. Other: None IMPRESSION: No acute intracranial pathology. Electronically Signed   By: Anner Crete M.D.   On: 08/10/2022 19:15     Marzetta Board, MD, PhD [facility]  Between 7 am - 7 pm I am available, please contact me via Amion (for emergencies) or Securechat (non urgent messages)  Between 7 pm - 7 am I am not available, please contact night coverage MD/APP via Amion

## 2022-08-11 NOTE — Progress Notes (Signed)
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 male returns for a follow up for continued management of vitamin B12 deficiency and hereditary hemochromatosis. He is admitted with chief complaint of light headedness, syncope and diarrhea.  Today he apparently reported to the hospitalist that he believes all his symptoms are because he didn't get a phlebotomy in August. Hence hematology was consulted.  He mentioned to me that he thinks iron posioning caused all his symptoms, he googled if iron poisoning could have caused his symptoms. He started drinking again. He said it was his wife's death anniversary and he felt very sad so he started binge drinking, he wasn't eating well. He stopped going to the  support group. He also didn't take his sertraline and levothyroxine for a few days, apparently he couldn't find the bottle  Rest of the pertinent 10 point ROS reviewed and negative.  MEDICAL HISTORY:  Past Medical History:  Diagnosis Date   Allergy    occ. seasonal   Anxiety    Arthritis    Colon polyp    Condyloma    anal   Depression    GERD (gastroesophageal reflux disease)    History of transfusion    as child   Hypertension    Knee injury    right knee cap w piece broken off- MRI done 02-06-15-pending surgery. 11-18-16 right knee is still painful, both shoulders(limited ROM right).   Knee pain    Neuropathy    bilateral- greater left   Palpitations    normal stress test 10 yrs ago   Pneumonia    Thyroid disease    Thyroid removed unintentionally at age 32- no further thyroid tissue remains-uses daily supplement    SURGICAL HISTORY: Past Surgical History:  Procedure Laterality Date   COLONOSCOPY     COLONOSCOPY N/A 02/15/2015    Procedure: COLONOSCOPY;  Surgeon: Alphonsa Overall, MD;  Location: [facility];  Service: General;  Laterality: N/A;   cyst removed     from neck / abdomed   EXAMINATION UNDER ANESTHESIA N/A 02/15/2015   Procedure: EXAM UNDER ANESTHESIA;  Surgeon: Pedro Earls, MD;  Location: [facility];  Service: General;  Laterality: N/A;   HERNIA REPAIR Left    10 yrs ago   KNEE ARTHROSCOPY Left    x1 and meniscal tear    ROTATOR CUFF REPAIR Right    x1   SHOULDER ARTHROSCOPY W/ ROTATOR CUFF REPAIR Left    THYROIDECTOMY  age 73    TOTAL HIP ARTHROPLASTY Left 11/24/2016   Procedure: LEFT TOTAL HIP ARTHROPLASTY ANTERIOR APPROACH;  Surgeon: Paralee Cancel, MD;  Location: [facility];  Service: Orthopedics;  Laterality: Left;   TOTAL HIP ARTHROPLASTY     Right hip  Dr. Alvan Dame 08/03/17   TOTAL HIP ARTHROPLASTY Right 08/03/2017   Procedure: RIGHT TOTAL HIP ARTHROPLASTY ANTERIOR APPROACH;  Surgeon: Paralee Cancel, MD;  Location: [facility];  Service: Orthopedics;  Laterality: Right;  70 mins   UMBILICAL HERNIA REPAIR N/A 10/18/2017   Procedure: Umbilical hernia repair  and excision of lipomas;  Surgeon: Johnathan Hausen, MD;  Location: [facility];  Service: General;  Laterality: N/A;   WART FULGURATION N/A 02/15/2015   Procedure: REMOVAL OF ANAL TAGS,CONDYLOMA;  Surgeon: Pedro Earls, MD;  Location: [facility];  Service: General;  Laterality: N/A;   WRIST SURGERY     x2- left (repair tendon/ ligament)    SOCIAL HISTORY: Social History   Socioeconomic History   Marital status: Widowed    Spouse name: Not on file   Number of children: Not on file   Years of education: Not on file   Highest education level: Not on file  Occupational History   Not on file  Tobacco Use   Smoking status: Former    Types: Cigars   Smokeless tobacco: Never   Tobacco comments:    occasional quit 2012  Vaping Use   Vaping Use: Never used  Substance and Sexual Activity   Alcohol use: Yes    Comment: drinking liquor each night recently   Drug use:  No   Sexual activity: Yes  Other Topics Concern   Not on file  Social History Narrative   Not on file   Social Determinants of Health   Financial Resource Strain: Not on file  Food Insecurity: Not on file  Transportation Needs: Not on file  Physical Activity: Not on file  Stress: Not on file  Social Connections: Not on file  Intimate Partner Violence: Not on file    FAMILY HISTORY: Family History  Problem Relation Age of Onset   Heart disease Mother            Lung disease Brother    Stroke Father     ALLERGIES:  is allergic to sulfa antibiotics and oxycodone.  MEDICATIONS:  Current Facility-Administered Medications  Medication Dose Route Frequency Provider Last Rate Last Admin   0.9 %  sodium chloride infusion   Intravenous Continuous Caren Griffins, MD 125 mL/hr at 08/11/22 1445 New Bag at 08/11/22 1445   acetaminophen (TYLENOL) tablet 650 mg  650 mg Oral Q6H PRN Shela Leff, MD   650 mg at 08/11/22 0231   Or   acetaminophen (TYLENOL) suppository 650 mg  650 mg Rectal Q6H PRN Shela Leff, MD       atorvastatin (LIPITOR) tablet 80 mg  80 mg Oral Daily Shela Leff, MD   80 mg at 08/11/22 0102   cyanocobalamin (VITAMIN B12) tablet 1,000 mcg  1,000 mcg Oral Daily Shela Leff, MD   1,000 mcg at 08/11/22 7253   famotidine (PEPCID) tablet 20 mg  20 mg Oral Daily PRN Shela Leff, MD       folic acid (FOLVITE) tablet 1 mg  1 mg Oral Daily Shela Leff, MD   1 mg at 08/11/22 6644   gabapentin (NEURONTIN) capsule 300 mg  300 mg Oral Daily Shela Leff, MD   300 mg at 08/11/22 0347   gabapentin (NEURONTIN) capsule 600 mg  600 mg Oral QHS Shela Leff, MD   600 mg at 08/11/22 2108   heparin injection 5,000 Units  5,000 Units Subcutaneous Quay Burow, MD   5,000 Units at 08/11/22 2109   hydrOXYzine (ATARAX) tablet 25 mg  25 mg Oral Q4H PRN Shela Leff, MD   25 mg at 08/11/22 0101   levothyroxine (SYNTHROID)  tablet 175 mcg  175 mcg Oral Q0600 Shela Leff, MD   175 mcg at 08/11/22 4259   LORazepam (ATIVAN) tablet 1-4 mg  1-4 mg Oral Q1H PRN Shela Leff, MD       Or   LORazepam (ATIVAN) injection 1-4 mg  1-4 mg Intravenous Q1H PRN Shela Leff, MD       multivitamin with minerals tablet 1 tablet  1 tablet  Oral Daily Shela Leff, MD   1 tablet at 08/11/22 0939   phenol (CHLORASEPTIC) mouth spray 1 spray  1 spray Mouth/Throat PRN Shela Leff, MD       Derrill Memo ON 08/12/2022] pneumococcal 20-valent conjugate vaccine (PREVNAR 20) injection 0.5 mL  0.5 mL Intramuscular Tomorrow-1000 Shela Leff, MD       sertraline (ZOLOFT) tablet 50 mg  50 mg Oral QHS Shela Leff, MD   50 mg at 08/11/22 2109   thiamine (VITAMIN B1) tablet 100 mg  100 mg Oral Daily Shela Leff, MD   100 mg at 08/11/22 8676   Or   thiamine (VITAMIN B1) injection 100 mg  100 mg Intravenous Daily Shela Leff, MD        PHYSICAL EXAMINATION: ECOG PERFORMANCE STATUS: 0 - Asymptomatic  Vitals:   08/11/22 1645 08/11/22 2039  BP: 124/89 127/88  Pulse: 87 85  Resp:  19  Temp:  98.8 F (37.1 C)  SpO2: 99% 100%   Filed Weights   08/11/22 0037  Weight: 200 lb 9.9 oz (91 kg)   PHYSICAL EXAM:  Constitutional: Oriented to person, place, and time and well-developed, well-nourished, and in no distress.  No lower extremity swelling   LABORATORY DATA:  I have reviewed the data as listed Lab Results  Component Value Date   WBC 6.9 08/11/2022   HGB 12.1 (L) 08/11/2022   HCT 35.7 (L) 08/11/2022   MCV 99.4 08/11/2022   PLT 116 (L) 08/11/2022     Chemistry      Component Value Date/Time   NA 139 08/11/2022 0534   K 3.4 (L) 08/11/2022 0534   CL 111 08/11/2022 0534   CO2 23 08/11/2022 0534   BUN 17 08/11/2022 0534   CREATININE 1.49 (H) 08/11/2022 0534   CREATININE 0.85 07/24/2022 0807   CREATININE 1.22 07/13/2013 1253      Component Value Date/Time   CALCIUM 8.8 (L)  08/11/2022 0534   ALKPHOS 54 08/10/2022 1919   AST 39 08/10/2022 1919   AST 148 (H) 07/24/2022 0807   ALT 32 08/10/2022 1919   ALT 119 (H) 07/24/2022 0807   BILITOT 0.2 (L) 08/10/2022 1919   BILITOT 0.4 07/24/2022 0807     CBC reviewed,  RADIOGRAPHIC STUDIES: I have personally reviewed the radiological images as listed and agreed with the findings in the report. DG Hand Complete Right  Result Date: 08/10/2022 CLINICAL DATA:  Pain in the right third and fourth digits. EXAM: RIGHT HAND - COMPLETE 3+ VIEW COMPARISON:  None Available. FINDINGS: There is no acute fracture or dislocation the bones are mildly osteopenic. No significant arthritic changes. The soft tissues are unremarkable. IMPRESSION: Negative. Electronically Signed   By: Anner Crete M.D.   On: 08/10/2022 19:19   CT HEAD WO CONTRAST (5MM)  Result Date: 08/10/2022 CLINICAL DATA:  Head trauma. EXAM: CT HEAD WITHOUT CONTRAST TECHNIQUE: Contiguous axial images were obtained from the base of the skull through the vertex without intravenous contrast. RADIATION DOSE REDUCTION: This exam was performed according to the departmental dose-optimization program which includes automated exposure control, adjustment of the mA and/or kV according to patient size and/or use of iterative reconstruction technique. COMPARISON:  Head CT dated 04/12/2022. FINDINGS: Brain: The ventricles and sulci are appropriate size for the patient's age. The gray-white matter discrimination is preserved. There is no acute intracranial hemorrhage. No mass effect or midline shift. No extra-axial fluid collection. Vascular: No hyperdense vessel or unexpected calcification. Skull: Normal. Negative for fracture or focal  lesion. Sinuses/Orbits: No acute finding. Other: None IMPRESSION: No acute intracranial pathology. Electronically Signed   By: Anner Crete M.D.   On: 08/10/2022 19:15    ASSESSMENT & PLAN:  Philip Humphrey is a 61 y.o. male who returns for a follow  up for vitamin B12 deficiency and hereditary hemochromatosis.   #1 Hereditary hemochromatosis, carrier for C282Y gene: When he came last in August, his ferritin was 53 and Hb at 12.6, hence we deferred phlebotomy since his ferritin is right at target, I clearly expressed to him that none of his symptoms are related to hemochromatosis. We discussed clearly that every time he gets admitted and has hypotension and AKI, he also happens to be binge drinking and not drinking or eating well. He also right before this admission suddenly stopped taking sertraline and levothyroxine. I clearly explained to him that as much as he wants to think this is his hemochromatosis, this is not consistent with hemochromatosis He will need some help with quitting the alcohol, but he doesn't appear motivated. He has on multiple occasions made promised about quitting I have strongly encouraged him to consider rehabilitation. We will arrange for outpatient visit after discharge.  2.  B12 deficiency: --last B12 levels in August were normal.  All questions were answered. The patient knows to call the clinic with any problems, questions or concerns.  I have spent a total of 35 minutes minutes of face-to-face and non-face-to-face time, preparing to see the patient, performing a medically appropriate examination, counseling and educating the patient, ordering medications/tests, communicating with other health care professionals, documenting clinical information in the electronic health record, and care coordination.

## 2022-08-11 NOTE — Progress Notes (Signed)
       CROSS COVER NOTE  NAME: Philip Humphrey MRN: [id number] DOB : [birth year]    Date of Service   08/11/22  HPI/Events of Note   Medication request received for Imodium for reports of diarrhea. Cdiff negative and GI pathogen panel collected, results pending.  Interventions   Plan: Imodium x1    This document was prepared using Dragon voice recognition software and may include unintentional dictation errors.  Neomia Glass DNP, MHA, FNP-BC Nurse Practitioner [facility] Pager [phone]

## 2022-08-11 NOTE — Telephone Encounter (Signed)
VM received from pt stating he is currently in [facility] " after my BP dropped and I fell".  "This is the 4th time this year this has happened because of my hemochromotosis and I had almost died"   "I came last week and told I didn't need a phlebotomy and now this has happened"  " I would to see Dr Chryl Heck and get this settled because I think if they took a pint of blood about every month I wouldn't be having these issues.  This note was given to MD for review for appropriate follow up.  Of note pt's ferritin was 53 per lab earlier this month and did not meet criteria for phlebotomy.

## 2022-08-11 NOTE — Plan of Care (Signed)
Pt admitted to unit this shift. Pt states he felt better. Pt did begin to have pain in jaws, cheeks, and ears bilaterally. Seemed to be TMJ area. Pt feels better with heat packs applied. Problem: Education: Goal: Knowledge of General Education information will improve Description: Including pain rating scale, medication(s)/side effects and non-pharmacologic comfort measures Outcome: Progressing   Problem: Health Behavior/Discharge Planning: Goal: Ability to manage health-related needs will improve Outcome: Progressing   Problem: Clinical Measurements: Goal: Ability to maintain clinical measurements within normal limits will improve Outcome: Progressing Goal: Will remain free from infection Outcome: Progressing Goal: Diagnostic test results will improve Outcome: Progressing Goal: Respiratory complications will improve Outcome: Progressing Goal: Cardiovascular complication will be avoided Outcome: Progressing   Problem: Activity: Goal: Risk for activity intolerance will decrease Outcome: Progressing   Problem: Nutrition: Goal: Adequate nutrition will be maintained Outcome: Progressing   Problem: Coping: Goal: Level of anxiety will decrease Outcome: Progressing   Problem: Elimination: Goal: Will not experience complications related to bowel motility Outcome: Progressing Goal: Will not experience complications related to urinary retention Outcome: Progressing   Problem: Pain Managment: Goal: General experience of comfort will improve Outcome: Progressing   Problem: Safety: Goal: Ability to remain free from injury will improve Outcome: Progressing   Problem: Skin Integrity: Goal: Risk for impaired skin integrity will decrease Outcome: Progressing

## 2022-08-12 DIAGNOSIS — R197 Diarrhea, unspecified: Secondary | ICD-10-CM

## 2022-08-12 DIAGNOSIS — N179 Acute kidney failure, unspecified: Secondary | ICD-10-CM

## 2022-08-12 DIAGNOSIS — F1010 Alcohol abuse, uncomplicated: Secondary | ICD-10-CM | POA: Diagnosis not present

## 2022-08-12 DIAGNOSIS — R55 Syncope and collapse: Secondary | ICD-10-CM | POA: Diagnosis not present

## 2022-08-12 LAB — COMPREHENSIVE METABOLIC PANEL
ALT: 20 U/L (ref 0–44)
AST: 19 U/L (ref 15–41)
Albumin: 3.2 g/dL — ABNORMAL LOW (ref 3.5–5.0)
Alkaline Phosphatase: 36 U/L — ABNORMAL LOW (ref 38–126)
Anion gap: 5 (ref 5–15)
BUN: 10 mg/dL (ref 8–23)
CO2: 23 mmol/L (ref 22–32)
Calcium: 8.5 mg/dL — ABNORMAL LOW (ref 8.9–10.3)
Chloride: 114 mmol/L — ABNORMAL HIGH (ref 98–111)
Creatinine, Ser: 0.83 mg/dL (ref 0.61–1.24)
GFR, Estimated: >60 mL/min (ref >60)
Glucose, Bld: 87 mg/dL (ref 70–99)
Potassium: 3.7 mmol/L (ref 3.5–5.1)
Sodium: 142 mmol/L (ref 135–145)
Total Bilirubin: 0.4 mg/dL (ref 0.3–1.2)
Total Protein: 5.4 g/dL — ABNORMAL LOW (ref 6.5–8.1)

## 2022-08-12 LAB — GASTROINTESTINAL PANEL BY PCR, STOOL (REPLACES STOOL CULTURE)

## 2022-08-12 LAB — CBC
HCT: 35.5 % — ABNORMAL LOW (ref 39.0–52.0)
Hemoglobin: 11.6 g/dL — ABNORMAL LOW (ref 13.0–17.0)
MCH: 33.1 pg (ref 26.0–34.0)
MCHC: 32.7 g/dL (ref 30.0–36.0)
MCV: 101.4 fL — ABNORMAL HIGH (ref 80.0–100.0)
Platelets: 102 10*3/uL — ABNORMAL LOW (ref 150–400)
RBC: 3.5 MIL/uL — ABNORMAL LOW (ref 4.22–5.81)
RDW: 14.3 % (ref 11.5–15.5)
WBC: 5.3 10*3/uL (ref 4.0–10.5)
nRBC: 0 % (ref 0.0–0.2)

## 2022-08-12 LAB — MAGNESIUM: Magnesium: 1.3 mg/dL — ABNORMAL LOW (ref 1.7–2.4)

## 2022-08-12 MED ORDER — MAGNESIUM SULFATE 2 GM/50ML IV SOLN
2.0000 g | Freq: Once | INTRAVENOUS | Status: AC
Start: 2022-08-12 — End: 2022-08-12
  Administered 2022-08-12: 2 g via INTRAVENOUS
  Filled 2022-08-12: qty 50

## 2022-08-12 MED ORDER — MAGNESIUM SULFATE 4 GM/100ML IV SOLN: 4.0000 g | Freq: Once | INTRAVENOUS | Status: DC

## 2022-08-12 NOTE — Discharge Instructions (Signed)
_________________________________________________________________________________________________________________ Substance Abuse Treatment Programs   Intensive Outpatient Programs [facility]                                   [address], [place]                                                     [facility] [place] [address] [place], [place]   [facility]                             (Inpatient and outpatient)                                             [address]                                                                                                                [place] [phone] (Suboxone and Methadone)   [address]                                                           [place], [place] [address]                                       [phone]  [address] [place], [place] (Outpatient/Inpatient, [place])                    (insurance only) [phone]                                                                                                                                    [facility] ([place]) [place], [place]                            [facility]                                        [address]                                         [place], [place]                                                     [facility] (for caregivers and family) [phone] [address] Kristeen Mans. [place], [place] [facility]  Counseling   [facility]: Mobile Crisis Management 24 hours:  [phone]   [facility] [employer] sliding scale fee and walk in schedule: M-F 8am-12pm/1pm-3pm [place], [place] [address] [place], [place] [address] [phone]   [facility] ([facility])- new patient walk-in appointments available Monday - Friday 8am -3pm.          [address] [place], [place] [address] [phone] or crisis line- [facility]/ Intensive Outpatient Therapy Program [place], [place] [address] [address]  [phone]                                                   [address], [place] [address]                                                 [place]   [facility] [phone]. [place], [place] [address]     [employer] [facility]                                                                                                             [address] Johnette Abraham  [place], [place] [address]                                                           [phone]   [place], [place], [place] [address] [phone]   [facility]                                   [address]                            [place], [place] [address]                                               [place], [place] [address]  [place] [address]                                                [place] [address]   [facility]                                               [address], [place], [place], [place] [address] [place]                                               [address]                                                [place], [place] [address]                                               [phone]                                                     [facility] [address] [place], [place] [address] [phone] Resources for Temporary Residential Assistance/Crisis [facility]) M-F 8am-3pm   [address], [place] [address]   [phone] Services include: laundry, barbering, support groups, case management, phone  & computer access, showers, AA/NA mtgs, mental health/substance abuse nurse, job skills class, disability information, VA assistance, spiritual classes, etc.

## 2022-08-12 NOTE — Consult Note (Signed)
[facility] Face-to-Face Psychiatry Consult   Reason for Consult:  Alcohol use Referring Physician:  Shela Leff. MD Patient Identification: Philip [place] MRN:  [id number] Principal Diagnosis: Syncope Diagnosis:  Principal Problem:   Syncope Active Problems:   Hypothyroidism   Alcohol abuse   AKI (acute kidney injury) ([place])   Hemochromatosis type 1 (HCC)   Orthostatic hypotension   Diarrhea   Alcohol use disorder   HLD (hyperlipidemia)   Peripheral neuropathy   Total Time spent with patient: 30 minutes  Subjective:   Philip [place] is a 61 y.o. male with medical history significant of hypertension, hyperlipidemia, hypothyroidism, alcohol use disorder, peripheral neuropathy, hereditary hemochromatosis, B12 deficiency, chronic thrombocytopenia, GERD, depression, anxiety, arthritis, colon polyp, history of medical noncompliance with leaving AMA presents to the ED for evaluation of lightheadedness, syncopal event, and diarrhea.   HPI:   Patient reports that he has a history of depression related to the death of his wife 4 years ago. States that he takes zoloft. He did miss taking for a few days. He states that drinks alcohol 3-4 nights per week. States that he will take 2-3 shots. States that he will go several days without drinking anything and then will drink for 3-4 nights. Denies a history of alcohol withdrawal symptoms. Patient reports that he was seeing a counselor and plans to reestablish care with her. Patient denies suicidal ideations. Denies a history of suicide attempts. Denies homicidal ideations. States that his daughter and grandchild live with him.  On evaluation patient is alert and oriented x 4, pleasant, and cooperative. Speech is clear and coherent. Mood is euthymic and affect is congruent with mood. Thought process is coherent and thought content is logical. Denies auditory and visual hallucinations. No indication that patient is responding to internal stimuli. No  evidence of delusional thought content. Denies suicidal ideations. Denies homicidal ideations.    Past Psychiatric History: Depression, alcohol use disorder  Risk to Self:  No Risk to Others:  NO Prior Inpatient Therapy:  No  Prior Outpatient Therapy:  Yes  Past Medical History:  Past Medical History:  Diagnosis Date   Allergy    occ. seasonal   Anxiety    Arthritis    Colon polyp    Condyloma    anal   Depression    GERD (gastroesophageal reflux disease)    History of transfusion    as child   Hypertension    Knee injury    right knee cap w piece broken off- MRI done 02-06-15-pending surgery. 11-18-16 right knee is still painful, both shoulders(limited ROM right).   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 thyroid tissue remains-uses daily supplement    Past Surgical History:  Procedure Laterality Date   COLONOSCOPY     COLONOSCOPY N/A 02/15/2015   Procedure: COLONOSCOPY;  Surgeon: Alphonsa Overall, MD;  Location: [facility];  Service: General;  Laterality: N/A;   cyst removed     from neck / abdomed   EXAMINATION UNDER ANESTHESIA N/A 02/15/2015   Procedure: EXAM UNDER ANESTHESIA;  Surgeon: Pedro Earls, MD;  Location: [facility];  Service: General;  Laterality: N/A;   HERNIA REPAIR Left    10 yrs ago   KNEE ARTHROSCOPY Left    x1 and meniscal tear  ROTATOR CUFF REPAIR Right    x1   SHOULDER ARTHROSCOPY W/ ROTATOR CUFF REPAIR Left    THYROIDECTOMY  age 77    TOTAL HIP ARTHROPLASTY Left 11/24/2016   Procedure: LEFT TOTAL HIP ARTHROPLASTY ANTERIOR APPROACH;  Surgeon: Paralee Cancel, MD;  Location: [facility];  Service: Orthopedics;  Laterality: Left;   TOTAL HIP ARTHROPLASTY     Right hip  Dr. Alvan Dame 08/03/17   TOTAL HIP ARTHROPLASTY Right 08/03/2017   Procedure: RIGHT TOTAL HIP ARTHROPLASTY ANTERIOR APPROACH;  Surgeon: Paralee Cancel, MD;   Location: [facility];  Service: Orthopedics;  Laterality: Right;  70 mins   UMBILICAL HERNIA REPAIR N/A 10/18/2017   Procedure: Umbilical hernia repair  and excision of lipomas;  Surgeon: Johnathan Hausen, MD;  Location: [facility];  Service: General;  Laterality: N/A;   WART FULGURATION N/A 02/15/2015   Procedure: REMOVAL OF ANAL TAGS,CONDYLOMA;  Surgeon: Pedro Earls, MD;  Location: [facility];  Service: General;  Laterality: N/A;   WRIST SURGERY     x2- left (repair tendon/ ligament)   Family History:  Family History  Problem Relation Age of Onset   Heart disease Mother    Cancer Brother        Lung   Lung disease Brother    Stroke Father     Social History:  Social History   Substance and Sexual Activity  Alcohol Use Yes   Comment: drinking liquor each night recently     Social History   Substance and Sexual Activity  Drug Use No    Social History   Socioeconomic History   Marital status: Widowed    Spouse name: Not on file   Number of children: Not on file   Years of education: Not on file   Highest education level: Not on file  Occupational History   Not on file  Tobacco Use   Smoking status: Former    Types: Cigars   Smokeless tobacco: Never   Tobacco comments:    occasional quit 2012  Vaping Use   Vaping Use: Never used  Substance and Sexual Activity   Alcohol use: Yes    Comment: drinking liquor each night recently   Drug use: No   Sexual activity: Yes  Other Topics Concern   Not on file  Social History Narrative   Not on file   Social Determinants of Health   Financial Resource Strain: Not on file  Food Insecurity: Not on file  Transportation Needs: Not on file  Physical Activity: Not on file  Stress: Not on file  Social Connections: Not on file   Additional Social History: Specify valuables returned: Games developer, wallet, phone, charger,  Allergies:   Allergies  Allergen Reactions   Sulfa Antibiotics Anaphylaxis and Swelling    Mouth and throat  swelling   Oxycodone Itching    Labs:  Results for orders placed or performed during the hospital encounter of 08/10/22 (from the past 48 hour(s))  Urinalysis, Routine w reflex microscopic     Status: Abnormal   Collection Time: 08/10/22  6:25 PM  Result Value Ref Range   Color, Urine AMBER (A) YELLOW    Comment: BIOCHEMICALS MAY BE AFFECTED BY COLOR   APPearance HAZY (A) CLEAR   Specific Gravity, Urine 1.020 1.005 - 1.030   pH 5.0 5.0 - 8.0   Glucose, UA NEGATIVE NEGATIVE mg/dL   Hgb urine dipstick NEGATIVE NEGATIVE   Bilirubin Urine NEGATIVE NEGATIVE   Ketones, ur NEGATIVE NEGATIVE mg/dL  Protein, ur 30 (A) NEGATIVE mg/dL   Nitrite NEGATIVE NEGATIVE   Leukocytes,Ua TRACE (A) NEGATIVE   RBC / HPF 0-5 0 - 5 RBC/hpf   WBC, UA 11-20 0 - 5 WBC/hpf   Bacteria, UA NONE SEEN NONE SEEN   Squamous Epithelial / LPF 0-5 0 - 5   Mucus PRESENT    Hyaline Casts, UA PRESENT     Comment: Performed at [facility], [place] [address], [place], [place] [address]  CBG monitoring, ED     Status: None   Collection Time: 08/10/22  6:38 PM  Result Value Ref Range   Glucose-Capillary 84 70 - 99 mg/dL    Comment: Glucose reference range applies only to samples taken after fasting for at least 8 hours.  CBC     Status: Abnormal   Collection Time: 08/10/22  7:19 PM  Result Value Ref Range   WBC 9.6 4.0 - 10.5 K/uL   RBC 4.53 4.22 - 5.81 MIL/uL   Hemoglobin 15.1 13.0 - 17.0 g/dL   HCT 45.0 39.0 - 52.0 %   MCV 99.3 80.0 - 100.0 fL   MCH 33.3 26.0 - 34.0 pg   MCHC 33.6 30.0 - 36.0 g/dL   RDW 14.6 11.5 - 15.5 %   Platelets 149 (L) 150 - 400 K/uL   nRBC 0.0 0.0 - 0.2 %    Comment: Performed at [facility], [place] [address], [place], [place] [address]  Comprehensive metabolic panel     Status: Abnormal   Collection Time: 08/10/22  7:19 PM  Result Value Ref Range   Sodium 137 135 - 145 mmol/L   Potassium 3.7 3.5 - 5.1 mmol/L   Chloride 100 98 - 111 mmol/L   CO2  27 22 - 32 mmol/L   Glucose, Bld 91 70 - 99 mg/dL    Comment: Glucose reference range applies only to samples taken after fasting for at least 8 hours.   BUN 20 8 - 23 mg/dL   Creatinine, Ser 2.34 (H) 0.61 - 1.24 mg/dL   Calcium 10.4 (H) 8.9 - 10.3 mg/dL   Total Protein 7.6 6.5 - 8.1 g/dL   Albumin 4.7 3.5 - 5.0 g/dL   AST 39 15 - 41 U/L   ALT 32 0 - 44 U/L   Alkaline Phosphatase 54 38 - 126 U/L   Total Bilirubin 0.2 (L) 0.3 - 1.2 mg/dL   GFR, Estimated 31 (L) >60 mL/min    Comment: (NOTE) Calculated using the CKD-EPI Creatinine Equation (2021)    Anion gap 10 5 - 15    Comment: Performed at [facility], [place] [address], [place], [place] [address]  Ethanol     Status: None   Collection Time: 08/10/22  7:19 PM  Result Value Ref Range   Alcohol, Ethyl (B) <10 <10 mg/dL    Comment: (NOTE) Lowest detectable limit for serum alcohol is 10 mg/dL.  For medical purposes only. Performed at [facility], [place] [address], [place], [place] [address]   Troponin I (High Sensitivity)     Status: None   Collection Time: 08/10/22  7:19 PM  Result Value Ref Range   Troponin I (High Sensitivity) 2 <18 ng/L    Comment: (NOTE) Elevated high sensitivity troponin I (hsTnI) values and significant  changes across serial measurements may suggest ACS but many other  chronic and acute conditions are known to elevate hsTnI results.  Refer to the "Links" section for chest  pain algorithms and additional  guidance. Performed at [facility], [place] [address], [place], [place] [address]   CBC     Status: Abnormal   Collection Time: 08/11/22  5:34 AM  Result Value Ref Range   WBC 6.9 4.0 - 10.5 K/uL   RBC 3.59 (L) 4.22 - 5.81 MIL/uL   Hemoglobin 12.1 (L) 13.0 - 17.0 g/dL   HCT 35.7 (L) 39.0 - 52.0 %   MCV 99.4 80.0 - 100.0 fL   MCH 33.7 26.0 - 34.0 pg   MCHC 33.9 30.0 - 36.0 g/dL   RDW 14.4 11.5 - 15.5 %   Platelets 116 (L) 150 - 400 K/uL    nRBC 0.0 0.0 - 0.2 %    Comment: Performed at [facility], [place] [address] [place], [place] [address]  Basic metabolic panel     Status: Abnormal   Collection Time: 08/11/22  5:34 AM  Result Value Ref Range   Sodium 139 135 - 145 mmol/L   Potassium 3.4 (L) 3.5 - 5.1 mmol/L   Chloride 111 98 - 111 mmol/L   CO2 23 22 - 32 mmol/L   Glucose, Bld 92 70 - 99 mg/dL    Comment: Glucose reference range applies only to samples taken after fasting for at least 8 hours.   BUN 17 8 - 23 mg/dL   Creatinine, Ser 1.49 (H) 0.61 - 1.24 mg/dL   Calcium 8.8 (L) 8.9 - 10.3 mg/dL   GFR, Estimated 53 (L) >60 mL/min    Comment: (NOTE) Calculated using the CKD-EPI Creatinine Equation (2021)    Anion gap 5 5 - 15    Comment: Performed at [facility], [place] [address] [place], [place] [address]  C Difficile Quick Screen w PCR reflex     Status: None   Collection Time: 08/11/22  1:12 PM   Specimen: STOOL  Result Value Ref Range   C Diff antigen NEGATIVE NEGATIVE   C Diff toxin NEGATIVE NEGATIVE   C Diff interpretation No C. difficile detected.     Comment: Performed at [facility], [place] [address], [place], [place] [address]  Comprehensive metabolic panel     Status: Abnormal   Collection Time: 08/12/22  6:14 AM  Result Value Ref Range   Sodium 142 135 - 145 mmol/L   Potassium 3.7 3.5 - 5.1 mmol/L   Chloride 114 (H) 98 - 111 mmol/L   CO2 23 22 - 32 mmol/L   Glucose, Bld 87 70 - 99 mg/dL    Comment: Glucose reference range applies only to samples taken after fasting for at least 8 hours.   BUN 10 8 - 23 mg/dL   Creatinine, Ser 0.83 0.61 - 1.24 mg/dL   Calcium 8.5 (L) 8.9 - 10.3 mg/dL   Total Protein 5.4 (L) 6.5 - 8.1 g/dL   Albumin 3.2 (L) 3.5 - 5.0 g/dL   AST 19 15 - 41 U/L   ALT 20 0 - 44 U/L   Alkaline Phosphatase 36 (L) 38 - 126 U/L   Total Bilirubin 0.4 0.3 - 1.2 mg/dL   GFR, Estimated >60 >60 mL/min    Comment: (NOTE) Calculated using  the CKD-EPI Creatinine Equation (2021)    Anion gap 5 5 - 15    Comment: Performed at [facility], [place] [address] [place], [place] [address]  CBC     Status: Abnormal   Collection Time: 08/12/22  6:14 AM  Result  Value Ref Range   WBC 5.3 4.0 - 10.5 K/uL   RBC 3.50 (L) 4.22 - 5.81 MIL/uL   Hemoglobin 11.6 (L) 13.0 - 17.0 g/dL   HCT 35.5 (L) 39.0 - 52.0 %   MCV 101.4 (H) 80.0 - 100.0 fL   MCH 33.1 26.0 - 34.0 pg   MCHC 32.7 30.0 - 36.0 g/dL   RDW 14.3 11.5 - 15.5 %   Platelets 102 (L) 150 - 400 K/uL    Comment: Immature Platelet Fraction may be clinically indicated, consider ordering this additional test [id number]    nRBC 0.0 0.0 - 0.2 %    Comment: Performed at [facility], [place] [address], [place], [place] [address]  Magnesium     Status: Abnormal   Collection Time: 08/12/22  6:14 AM  Result Value Ref Range   Magnesium 1.3 (L) 1.7 - 2.4 mg/dL    Comment: Performed at [facility], [place] [address], [place], [place] [address]    Current Facility-Administered Medications  Medication Dose Route Frequency Provider Last Rate Last Admin   acetaminophen (TYLENOL) tablet 650 mg  650 mg Oral Q6H PRN Shela Leff, MD   650 mg at 08/11/22 0231   Or   acetaminophen (TYLENOL) suppository 650 mg  650 mg Rectal Q6H PRN Shela Leff, MD       atorvastatin (LIPITOR) tablet 80 mg  80 mg Oral Daily Shela Leff, MD   80 mg at 08/12/22 9242   cyanocobalamin (VITAMIN B12) tablet 1,000 mcg  1,000 mcg Oral Daily Shela Leff, MD   1,000 mcg at 08/12/22 0943   famotidine (PEPCID) tablet 20 mg  20 mg Oral Daily PRN Shela Leff, MD       folic acid (FOLVITE) tablet 1 mg  1 mg Oral Daily Shela Leff, MD   1 mg at 08/12/22 0943   gabapentin (NEURONTIN) capsule 300 mg  300 mg Oral Daily Shela Leff, MD   300 mg at 08/12/22 0943   gabapentin (NEURONTIN) capsule 600 mg  600 mg Oral QHS Shela Leff, MD    600 mg at 08/11/22 2108   heparin injection 5,000 Units  5,000 Units Subcutaneous Quay Burow, MD   5,000 Units at 08/12/22 6834   hydrOXYzine (ATARAX) tablet 25 mg  25 mg Oral Q4H PRN Shela Leff, MD   25 mg at 08/12/22 0020   levothyroxine (SYNTHROID) tablet 175 mcg  175 mcg Oral Q0600 Shela Leff, MD   175 mcg at 08/12/22 0537   LORazepam (ATIVAN) tablet 1-4 mg  1-4 mg Oral Q1H PRN Shela Leff, MD       Or   LORazepam (ATIVAN) injection 1-4 mg  1-4 mg Intravenous Q1H PRN Shela Leff, MD       multivitamin with minerals tablet 1 tablet  1 tablet Oral Daily Shela Leff, MD   1 tablet at 08/12/22 0943   phenol (CHLORASEPTIC) mouth spray 1 spray  1 spray Mouth/Throat PRN Shela Leff, MD       sertraline (ZOLOFT) tablet 50 mg  50 mg Oral QHS Shela Leff, MD   50 mg at 08/11/22 2109   thiamine (VITAMIN B1) tablet 100 mg  100 mg Oral Daily Shela Leff, MD   100 mg at 08/12/22 1962   Or   thiamine (VITAMIN B1) injection 100 mg  100 mg Intravenous Daily Shela Leff, MD       Current Outpatient Medications  Medication Sig Dispense Refill   atorvastatin (LIPITOR) 80  MG tablet Take 80 mg by mouth daily.     bismuth subsalicylate (PEPTO BISMOL) 262 MG/15ML suspension Take 30 mLs by mouth every 6 (six) hours as needed for indigestion (stomach pain).     calcium carbonate (TUMS - DOSED IN MG ELEMENTAL CALCIUM) 500 MG chewable tablet Chew 2 tablets by mouth at bedtime as needed for indigestion or heartburn.     Cyanocobalamin (B-12) 1000 MCG SUBL Place 1,000 mcg under the tongue daily. 30 tablet 6   diclofenac Sodium (VOLTAREN) 1 % GEL Apply 1 application topically 2 (two) times daily as needed (pain).     diphenhydramine-acetaminophen (TYLENOL PM) 25-500 MG TABS tablet Take 1 tablet by mouth at bedtime as needed (sleep).     famotidine (PEPCID) 40 MG tablet Take 40 mg by mouth daily as needed for heartburn or indigestion.      folic acid (FOLVITE) 1 MG tablet Take 1 tablet (1 mg total) by mouth daily. 30 tablet 0   gabapentin (NEURONTIN) 300 MG capsule Take 300-600 mg by mouth 2 (two) times daily. Take 1 capsule (300 mg) in the morning and Take 2 capsules (600 mg) at bedtime     hydrOXYzine (ATARAX/VISTARIL) 25 MG tablet Take 25 mg by mouth every 4 (four) hours as needed for anxiety. For itching     loperamide (IMODIUM) 1 MG/5ML solution Take 1 mg by mouth daily as needed for diarrhea or loose stools.     Magnesium 400 MG TABS Take 400 mg by mouth daily.     MELATONIN PO Take 1 tablet by mouth at bedtime as needed (sleep).     nitroGLYCERIN (NITROSTAT) 0.4 MG SL tablet Place 0.4 mg under the tongue every 5 (five) minutes as needed for chest pain.     Olopatadine HCl (PATADAY OP) Place 1 drop into both eyes 2 (two) times daily as needed (itching/allergies).     sertraline (ZOLOFT) 50 MG tablet Take 50 mg by mouth at bedtime.     SYNTHROID 175 MCG tablet Take 1 tablet (175 mcg total) by mouth daily. NO MORE REFILLS WITHOUT OFFICE VISIT - 2ND NOTICE (Patient taking differently: Take 175 mcg by mouth daily.) 15 tablet 0   thiamine 100 MG tablet Take 1 tablet (100 mg total) by mouth daily. 30 tablet 0   traMADol (ULTRAM) 50 MG tablet Take 50 mg by mouth every 4 (four) hours as needed for moderate pain.     Vitamin D, Ergocalciferol, (DRISDOL) 1.25 MG (50000 UNIT) CAPS capsule Take 50,000 Units by mouth every 7 (seven) days.      Musculoskeletal: Strength & Muscle Tone: within normal limits Gait & Station: normal Patient leans: N/A            Psychiatric Specialty Exam:  Presentation  General Appearance: Appropriate for Environment; Well Groomed  Eye Contact:Good  Speech:Clear and Coherent; Normal Rate  Speech Volume:Normal  Handedness:Right   Mood and Affect  Mood:Euthymic  Affect:Congruent   Thought Process  Thought Processes:Coherent; Goal Directed; Linear  Descriptions of  Associations:Intact  Orientation:Full (Time, Place and Person)  Thought Content:Logical  History of Schizophrenia/Schizoaffective disorder:No data recorded Duration of Psychotic Symptoms:No data recorded Hallucinations:Hallucinations: None  Ideas of Reference:None  Suicidal Thoughts:Suicidal Thoughts: No  Homicidal Thoughts:Homicidal Thoughts: No   Sensorium  Memory:Immediate Good; Recent Good; Remote Good  Judgment:Fair  Insight:Fair   Executive Functions  Concentration:Good  Attention Span:Good  [place] of Knowledge:Good  Language:Good   Psychomotor Activity  Psychomotor Activity:Psychomotor Activity: Normal   Assets  Assets:Communication Skills; Desire for Improvement; Financial Resources/Insurance; Housing; Physical Health; Social Support   Sleep  Sleep:Sleep: Fair   Physical Exam: Physical Exam Constitutional:      General: He is not in acute distress.    Appearance: He is not ill-appearing, toxic-appearing or diaphoretic.  HENT:     Right Ear: External ear normal.     Left Ear: External ear normal.  Cardiovascular:     Rate and Rhythm: Normal rate.  Pulmonary:     Effort: Pulmonary effort is normal. No respiratory distress.  Musculoskeletal:     Cervical back: Normal range of motion.  Neurological:     Mental Status: He is alert and oriented to person, place, and time.  Psychiatric:        Thought Content: Thought content is not paranoid or delusional. Thought content does not include homicidal or suicidal ideation.    Review of Systems  Constitutional:  Negative for chills, diaphoresis and fever.  Respiratory:  Negative for cough and shortness of breath.   Cardiovascular:  Negative for chest pain and palpitations.  Gastrointestinal:  Negative for diarrhea, nausea and vomiting.  Psychiatric/Behavioral:  Positive for depression and substance abuse. Negative for hallucinations, memory loss and suicidal ideas. The patient is  nervous/anxious and has insomnia.    Blood pressure 112/81, pulse 78, temperature 98 F (36.7 C), resp. rate 18, height '5\' 10"'$  (1.778 m), weight 91 kg, SpO2 98 %. Body mass index is 28.79 kg/m.    Disposition: No evidence of imminent risk to self or others at present.   Patient does not meet criteria for psychiatric inpatient admission. Discussed crisis plan, support from social network, calling 911, coming to the Emergency Department, and calling Suicide Hotline.  Outpatient resources for therapy and substance abuse treatment have been placed in the discharge instructions.   Rozetta Nunnery, NP 08/12/2022 2:34 PM

## 2022-08-12 NOTE — Discharge Summary (Signed)
Physician Discharge Summary  Kamuela Magos Bodiford IFO:[id number] DOB: [birth year] DOA: 08/10/2022  PCP: Simona Huh, NP  Admit date: 08/10/2022 Discharge date: 08/12/2022  Admitted From: home Discharge disposition: home   Recommendations for Outpatient Follow-Up:   Close outpatient follow up with Counsellor/psych- patient does not have active suicidal ideations/plan-- was doing well with his counseling but stopped going- able to contract for safety and I do not think he needs inpatient psych Alcohol cessation Cbc, bmp, mg 1 month   Discharge Diagnosis:   Principal Problem:   Syncope Active Problems:   AKI (acute kidney injury) ([place])   Hypothyroidism   Alcohol abuse   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  Diet recommendation: Low sodium, heart healthy.  Wound care: None.  Code status: Full.   History of Present Illness:   Philip Humphrey is a 61 y.o. male with medical history significant of hypertension, hyperlipidemia, hypothyroidism, alcohol use disorder, peripheral neuropathy, hereditary hemochromatosis, B12 deficiency, chronic thrombocytopenia, GERD, depression, anxiety, arthritis, colon polyp, history of medical noncompliance with leaving AMA presents to the ED for evaluation of lightheadedness, syncopal event, and diarrhea.  Orthostatics positive (systolic dropped from 676 supine to 70 standing).  Afebrile.  Labs showing no leukocytosis or anemia, platelet count improved, creatinine 2.3 (baseline 0.7-0.8), UA pending, blood ethanol level undetectable, high-sensitivity troponin negative.  CT head negative for acute finding.  He complained of right hand pain but x-ray was negative. Patient was given 2 L normal saline.   Patient states yesterday he was having watery, nonbloody diarrhea all day.  Today around lunchtime he took  Imodium after which the diarrhea stopped.  He was able to eat a [name] meal for lunch which included burger and fries.  Denies fevers, chills nausea, vomiting, or abdominal pain.  No recent antibiotic use.  He does report feeling dizzy since his diarrhea started yesterday and today as he was walking to his garage, he felt dizzy again and thinks he passed out.  When he regained consciousness he felt short of breath while he was calling EMS.  Denies chest pain.  No longer feels short of breath.  Reports previously taking lisinopril which was stopped and he now takes olmesartan for hypertension.     Hospital Course by Problem:   Syncope, orthostatic hypotension-on admission patient's blood pressure dropped into the 70s upon standing, and he was quite symptomatic.  -from dehydration -resolved    Acute kidney injury-prerenal from severe dehydration, creatinine 2.3 on admission.   -resolved with IVF   Diarrhea-he tells me he has been drinking up until 3 AM the night prior, I wonder whether it is related to that, no other clear-cut triggers -  C. difficile negative.  GI pathogen panel negative  Hyperlipidemia-continue Lipitor   Hypothyroidism-continue Synthroid   Hereditary hemochromatosis-outpatient follow-up.  Her recent hematology office visit note from 8/4, therapeutic phlebotomy was not recommended since ferritin was normal.   -seen by Dr. Elby Beck follow up   Alcohol use disorder, depression, hopelessness -continue outpatient follow up  Hypomagnesemia -replete  Medical Consultants:   Heme onc    Discharge Exam:   Vitals:   08/12/22 1022 08/12/22 1027  BP: 114/75 112/81  Pulse: 71 78  Resp:    Temp:    SpO2:     Vitals:   08/12/22 0504 08/12/22 1018 08/12/22 1022 08/12/22  1027  BP: 103/75 117/78 114/75 112/81  Pulse: 69 70 71 78  Resp: 18     Temp: 98 F (36.7 C)     TempSrc:      SpO2: 98%     Weight:      Height:        General exam: Appears calm  and comfortable.    The results of significant diagnostics from this hospitalization (including imaging, microbiology, ancillary and laboratory) are listed below for reference.     Procedures and Diagnostic Studies:   DG Hand Complete Right  Result Date: 08/10/2022 CLINICAL DATA:  Pain in the right third and fourth digits. EXAM: RIGHT HAND - COMPLETE 3+ VIEW COMPARISON:  None Available. FINDINGS: There is no acute fracture or dislocation the bones are mildly osteopenic. No significant arthritic changes. The soft tissues are unremarkable. IMPRESSION: Negative. Electronically Signed   By: Anner Crete M.D.   On: 08/10/2022 19:19   CT HEAD WO CONTRAST (5MM)  Result Date: 08/10/2022 CLINICAL DATA:  Head trauma. EXAM: CT HEAD WITHOUT CONTRAST TECHNIQUE: Contiguous axial images were obtained from the base of the skull through the vertex without intravenous contrast. RADIATION DOSE REDUCTION: This exam was performed according to the departmental dose-optimization program which includes automated exposure control, adjustment of the mA and/or kV according to patient size and/or use of iterative reconstruction technique. COMPARISON:  Head CT dated 04/12/2022. FINDINGS: Brain: The ventricles and sulci are appropriate size for the patient's age. The gray-white matter discrimination is preserved. There is no acute intracranial hemorrhage. No mass effect or midline shift. No extra-axial fluid collection. Vascular: No hyperdense vessel or unexpected calcification. Skull: Normal. Negative for fracture or focal lesion. Sinuses/Orbits: No acute finding. Other: None IMPRESSION: No acute intracranial pathology. Electronically Signed   By: Anner Crete M.D.   On: 08/10/2022 19:15     Labs:   Basic Metabolic Panel: Recent Labs  Lab 08/10/22 1919 08/11/22 0534 08/12/22 0614  NA 137 139 142  K 3.7 3.4* 3.7  CL 100 111 114*  CO2 '27 23 23  '$ GLUCOSE 91 92 87  BUN '20 17 10  '$ CREATININE 2.34* 1.49* 0.83   CALCIUM 10.4* 8.8* 8.5*  MG  --   --  1.3*   GFR Estimated Creatinine Clearance: 104.7 mL/min (by C-G formula based on SCr of 0.83 mg/dL). Liver Function Tests: Recent Labs  Lab 08/10/22 1919 08/12/22 0614  AST 39 19  ALT 32 20  ALKPHOS 54 36*  BILITOT 0.2* 0.4  PROT 7.6 5.4*  ALBUMIN 4.7 3.2*   No results for input(s): "LIPASE", "AMYLASE" in the last 168 hours. No results for input(s): "AMMONIA" in the last 168 hours. Coagulation profile No results for input(s): "INR", "PROTIME" in the last 168 hours.  CBC: Recent Labs  Lab 08/10/22 1919 08/11/22 0534 08/12/22 0614  WBC 9.6 6.9 5.3  HGB 15.1 12.1* 11.6*  HCT 45.0 35.7* 35.5*  MCV 99.3 99.4 101.4*  PLT 149* 116* 102*   Cardiac Enzymes: No results for input(s): "CKTOTAL", "CKMB", "CKMBINDEX", "TROPONINI" in the last 168 hours. BNP: Invalid input(s): "POCBNP" CBG: Recent Labs  Lab 08/10/22 1838  GLUCAP 84   D-Dimer No results for input(s): "DDIMER" in the last 72 hours. Hgb A1c No results for input(s): "HGBA1C" in the last 72 hours. Lipid Profile No results for input(s): "CHOL", "HDL", "LDLCALC", "TRIG", "CHOLHDL", "LDLDIRECT" in the last 72 hours. Thyroid function studies No results for input(s): "TSH", "T4TOTAL", "T3FREE", "THYROIDAB" in the last 72 hours.  Invalid input(s): "FREET3" Anemia work up No results for input(s): "VITAMINB12", "FOLATE", "FERRITIN", "TIBC", "IRON", "RETICCTPCT" in the last 72 hours. Microbiology Recent Results (from the past 240 hour(s))  Gastrointestinal Panel by PCR , Stool     Status: None   Collection Time: 08/10/22  9:44 AM   Specimen: STOOL  Result Value Ref Range Status   Campylobacter species NOT DETECTED NOT DETECTED Final   Plesimonas shigelloides NOT DETECTED NOT DETECTED Final   Salmonella species NOT DETECTED NOT DETECTED Final   Yersinia enterocolitica NOT DETECTED NOT DETECTED Final   Vibrio species NOT DETECTED NOT DETECTED Final   Vibrio cholerae NOT  DETECTED NOT DETECTED Final   Enteroaggregative E coli (EAEC) NOT DETECTED NOT DETECTED Final   Enteropathogenic E coli (EPEC) NOT DETECTED NOT DETECTED Final   Enterotoxigenic E coli (ETEC) NOT DETECTED NOT DETECTED Final   Shiga like toxin producing E coli (STEC) NOT DETECTED NOT DETECTED Final   Shigella/Enteroinvasive E coli (EIEC) NOT DETECTED NOT DETECTED Final   Cryptosporidium NOT DETECTED NOT DETECTED Final   Cyclospora cayetanensis NOT DETECTED NOT DETECTED Final   Entamoeba histolytica NOT DETECTED NOT DETECTED Final   Giardia lamblia NOT DETECTED NOT DETECTED Final   Adenovirus F40/41 NOT DETECTED NOT DETECTED Final   Astrovirus NOT DETECTED NOT DETECTED Final   Norovirus GI/GII NOT DETECTED NOT DETECTED Final   Rotavirus A NOT DETECTED NOT DETECTED Final   Sapovirus (I, II, IV, and V) NOT DETECTED NOT DETECTED Final    Comment: Performed at [facility], [address] [place], [place] [address]  C Difficile Quick Screen w PCR reflex     Status: None   Collection Time: 08/11/22  1:12 PM   Specimen: STOOL  Result Value Ref Range Status   C Diff antigen NEGATIVE NEGATIVE Final   C Diff toxin NEGATIVE NEGATIVE Final   C Diff interpretation No C. difficile detected.  Final    Comment: Performed at [facility], [place] [address], [place], [place] [address]     Discharge Instructions:   Discharge Instructions     Diet - low sodium heart healthy   Complete by: As directed    Discharge instructions   Complete by: As directed    Continue to follow up outpatient with your therapist Stay hydrated.  Avoid alcohol Holding BP medication for now-- may need to resume if BP elevated   Increase activity slowly   Complete by: As directed       Allergies as of 08/12/2022       Reactions   Sulfa Antibiotics Anaphylaxis, Swelling   Mouth and throat swelling   Oxycodone Itching        Medication List     STOP taking these medications     olmesartan 20 MG tablet Commonly known as: BENICAR       TAKE these medications    atorvastatin 80 MG tablet Commonly known as: LIPITOR Take 80 mg by mouth daily.   B-12 1000 MCG Subl Place 1,000 mcg under the tongue daily.   bismuth subsalicylate 709 GG/83MO suspension Commonly known as: PEPTO BISMOL Take 30 mLs by mouth every 6 (six) hours as needed for indigestion (stomach pain).   calcium carbonate 500 MG chewable tablet Commonly known as: TUMS - dosed in mg elemental calcium Chew 2 tablets by mouth at bedtime as needed for indigestion or heartburn.   diclofenac Sodium 1 % Gel Commonly known as: VOLTAREN Apply 1 application topically 2 (two)  times daily as needed (pain).   diphenhydramine-acetaminophen 25-500 MG Tabs tablet Commonly known as: TYLENOL PM Take 1 tablet by mouth at bedtime as needed (sleep).   famotidine 40 MG tablet Commonly known as: PEPCID Take 40 mg by mouth daily as needed for heartburn or indigestion.   folic acid 1 MG tablet Commonly known as: FOLVITE Take 1 tablet (1 mg total) by mouth daily.   gabapentin 300 MG capsule Commonly known as: NEURONTIN Take 300-600 mg by mouth 2 (two) times daily. Take 1 capsule (300 mg) in the morning and Take 2 capsules (600 mg) at bedtime   hydrOXYzine 25 MG tablet Commonly known as: ATARAX Take 25 mg by mouth every 4 (four) hours as needed for anxiety. For itching   loperamide 1 MG/5ML solution Commonly known as: IMODIUM Take 1 mg by mouth daily as needed for diarrhea or loose stools.   Magnesium 400 MG Tabs Take 400 mg by mouth daily.   MELATONIN PO Take 1 tablet by mouth at bedtime as needed (sleep).   nitroGLYCERIN 0.4 MG SL tablet Commonly known as: NITROSTAT Place 0.4 mg under the tongue every 5 (five) minutes as needed for chest pain.   PATADAY OP Place 1 drop into both eyes 2 (two) times daily as needed (itching/allergies).   sertraline 50 MG tablet Commonly known as: ZOLOFT Take  50 mg by mouth at bedtime.   Synthroid 175 MCG tablet Generic drug: levothyroxine Take 1 tablet (175 mcg total) by mouth daily. NO MORE REFILLS WITHOUT OFFICE VISIT - 2ND NOTICE What changed: additional instructions   thiamine 100 MG tablet Commonly known as: VITAMIN B1 Take 1 tablet (100 mg total) by mouth daily.   traMADol 50 MG tablet Commonly known as: ULTRAM Take 50 mg by mouth every 4 (four) hours as needed for moderate pain.   Vitamin D (Ergocalciferol) 1.25 MG (50000 UNIT) Caps capsule Commonly known as: DRISDOL Take 50,000 Units by mouth every 7 (seven) days.          Time coordinating discharge: 45 min  Signed:  Geradine Girt DO  [facility] 08/12/2022, 4:49 PM

## 2022-08-12 NOTE — Plan of Care (Signed)

## 2022-08-25 ENCOUNTER — Other Ambulatory Visit: Payer: Self-pay | Admitting: *Deleted

## 2022-08-26 ENCOUNTER — Telehealth: Payer: Self-pay | Admitting: *Deleted

## 2022-08-26 ENCOUNTER — Inpatient Hospital Stay: Payer: 59

## 2022-08-26 ENCOUNTER — Other Ambulatory Visit: Payer: Self-pay

## 2022-08-26 ENCOUNTER — Inpatient Hospital Stay: Payer: 59 | Attending: Hematology and Oncology

## 2022-08-26 DIAGNOSIS — E538 Deficiency of other specified B group vitamins: Secondary | ICD-10-CM | POA: Insufficient documentation

## 2022-08-26 LAB — CBC WITH DIFFERENTIAL (CANCER CENTER ONLY)
Abs Immature Granulocytes: 0.01 10*3/uL (ref 0.00–0.07)
Basophils Absolute: 0 10*3/uL (ref 0.0–0.1)
Basophils Relative: 0 %
Eosinophils Absolute: 0.3 10*3/uL (ref 0.0–0.5)
Eosinophils Relative: 6 %
HCT: 39.6 % (ref 39.0–52.0)
Hemoglobin: 13.8 g/dL (ref 13.0–17.0)
Immature Granulocytes: 0 %
Lymphocytes Relative: 26 %
Lymphs Abs: 1.3 10*3/uL (ref 0.7–4.0)
MCH: 33.1 pg (ref 26.0–34.0)
MCHC: 34.8 g/dL (ref 30.0–36.0)
MCV: 95 fL (ref 80.0–100.0)
Monocytes Absolute: 0.4 10*3/uL (ref 0.1–1.0)
Monocytes Relative: 8 %
Neutro Abs: 3 10*3/uL (ref 1.7–7.7)
Neutrophils Relative %: 60 %
Platelet Count: 156 10*3/uL (ref 150–400)
RBC: 4.17 MIL/uL — ABNORMAL LOW (ref 4.22–5.81)
RDW: 15.4 % (ref 11.5–15.5)
WBC Count: 5.1 10*3/uL (ref 4.0–10.5)
nRBC: 0 % (ref 0.0–0.2)

## 2022-08-26 LAB — CMP (CANCER CENTER ONLY)
ALT: 23 U/L (ref 0–44)
AST: 47 U/L — ABNORMAL HIGH (ref 15–41)
Albumin: 4.3 g/dL (ref 3.5–5.0)
Alkaline Phosphatase: 46 U/L (ref 38–126)
Anion gap: 13 (ref 5–15)
BUN: 13 mg/dL (ref 8–23)
CO2: 25 mmol/L (ref 22–32)
Calcium: 9.7 mg/dL (ref 8.9–10.3)
Chloride: 105 mmol/L (ref 98–111)
Creatinine: 0.79 mg/dL (ref 0.61–1.24)
GFR, Estimated: >60 mL/min (ref >60)
Glucose, Bld: 140 mg/dL — ABNORMAL HIGH (ref 70–99)
Potassium: 3.3 mmol/L — ABNORMAL LOW (ref 3.5–5.1)
Sodium: 143 mmol/L (ref 135–145)
Total Bilirubin: 0.7 mg/dL (ref 0.3–1.2)
Total Protein: 7.7 g/dL (ref 6.5–8.1)

## 2022-08-26 LAB — VITAMIN B12: Vitamin B-12: 151 pg/mL — ABNORMAL LOW (ref 180–914)

## 2022-08-26 NOTE — Progress Notes (Signed)
Ok to proceed with phlebotomy today with current labs per Dr Chryl Heck.  Philip Humphrey presents today for phlebotomy per MD orders. Phlebotomy procedure started at 1547 and ended at 1552. 509 grams removed. Patient observed for 30 minutes after procedure without any incident. Patient tolerated procedure well. IV needle removed intact. Beverage provided.

## 2022-08-26 NOTE — Telephone Encounter (Signed)
Per review of need for phlebotomy today- MD gave order to proceed if heme and hct are the same or higher then the 07/23/2022 levels.

## 2022-08-27 ENCOUNTER — Other Ambulatory Visit: Payer: Self-pay | Admitting: *Deleted

## 2022-08-27 LAB — FERRITIN: Ferritin: 84 ng/mL (ref 24–336)

## 2022-08-31 ENCOUNTER — Telehealth: Payer: Self-pay | Admitting: *Deleted

## 2022-08-31 NOTE — Telephone Encounter (Addendum)
-----   Message from Benay Pike, MD sent at 08/29/2022 11:37 PM EDT ----- Tivis Ringer, He may have stopped taking B12. His B12 levels are low, he needs to get back on it. He can start with pills again.   This RN called pt and discussed above - he states he has not been taking the B12 daily and will resume.  Ferritin reviewed with level done prior to his phlebotomy.  Appts reviewed for next month.  No further needs at present.

## 2022-09-02 ENCOUNTER — Encounter: Payer: Self-pay | Admitting: Hematology and Oncology

## 2022-09-14 ENCOUNTER — Emergency Department (HOSPITAL_COMMUNITY)
Admission: EM | Admit: 2022-09-14 | Discharge: 2022-09-15 | Disposition: A | Discharge status: Other Acute Inpatient Hospital | Payer: 59 | Attending: Emergency Medicine | Admitting: Emergency Medicine

## 2022-09-14 ENCOUNTER — Encounter (HOSPITAL_COMMUNITY): Payer: Self-pay

## 2022-09-14 DIAGNOSIS — Z87891 Personal history of nicotine dependence: Secondary | ICD-10-CM | POA: Diagnosis not present

## 2022-09-14 DIAGNOSIS — F1010 Alcohol abuse, uncomplicated: Secondary | ICD-10-CM | POA: Insufficient documentation

## 2022-09-14 DIAGNOSIS — Z20822 Contact with and (suspected) exposure to covid-19: Secondary | ICD-10-CM | POA: Diagnosis not present

## 2022-09-14 DIAGNOSIS — I10 Essential (primary) hypertension: Secondary | ICD-10-CM | POA: Insufficient documentation

## 2022-09-14 DIAGNOSIS — R7401 Elevation of levels of liver transaminase levels: Secondary | ICD-10-CM | POA: Insufficient documentation

## 2022-09-14 DIAGNOSIS — Z96641 Presence of right artificial hip joint: Secondary | ICD-10-CM | POA: Insufficient documentation

## 2022-09-14 DIAGNOSIS — F329 Major depressive disorder, single episode, unspecified: Secondary | ICD-10-CM | POA: Insufficient documentation

## 2022-09-14 DIAGNOSIS — Z046 Encounter for general psychiatric examination, requested by authority: Secondary | ICD-10-CM | POA: Diagnosis present

## 2022-09-14 DIAGNOSIS — F1090 Alcohol use, unspecified, uncomplicated: Secondary | ICD-10-CM

## 2022-09-14 DIAGNOSIS — Y906 Blood alcohol level of 120-199 mg/100 ml: Secondary | ICD-10-CM | POA: Insufficient documentation

## 2022-09-14 DIAGNOSIS — Z79899 Other long term (current) drug therapy: Secondary | ICD-10-CM | POA: Insufficient documentation

## 2022-09-14 LAB — CBC WITH DIFFERENTIAL/PLATELET
Abs Immature Granulocytes: 0.04 10*3/uL (ref 0.00–0.07)
Basophils Absolute: 0.1 10*3/uL (ref 0.0–0.1)
Basophils Relative: 1 %
Eosinophils Absolute: 0.3 10*3/uL (ref 0.0–0.5)
Eosinophils Relative: 4 %
HCT: 44.4 % (ref 39.0–52.0)
Hemoglobin: 14.8 g/dL (ref 13.0–17.0)
Immature Granulocytes: 0 %
Lymphocytes Relative: 33 %
Lymphs Abs: 3.1 10*3/uL (ref 0.7–4.0)
MCH: 33.3 pg (ref 26.0–34.0)
MCHC: 33.3 g/dL (ref 30.0–36.0)
MCV: 99.8 fL (ref 80.0–100.0)
Monocytes Absolute: 0.6 10*3/uL (ref 0.1–1.0)
Monocytes Relative: 6 %
Neutro Abs: 5.3 10*3/uL (ref 1.7–7.7)
Neutrophils Relative %: 56 %
Platelets: 360 10*3/uL (ref 150–400)
RBC: 4.45 MIL/uL (ref 4.22–5.81)
RDW: 16.3 % — ABNORMAL HIGH (ref 11.5–15.5)
WBC: 9.5 10*3/uL (ref 4.0–10.5)
nRBC: 0 % (ref 0.0–0.2)

## 2022-09-14 LAB — COMPREHENSIVE METABOLIC PANEL
ALT: 118 U/L — ABNORMAL HIGH (ref 0–44)
AST: 47 U/L — ABNORMAL HIGH (ref 15–41)
Albumin: 4.3 g/dL (ref 3.5–5.0)
Alkaline Phosphatase: 74 U/L (ref 38–126)
Anion gap: 15 (ref 5–15)
BUN: 17 mg/dL (ref 8–23)
CO2: 18 mmol/L — ABNORMAL LOW (ref 22–32)
Calcium: 9.5 mg/dL (ref 8.9–10.3)
Chloride: 105 mmol/L (ref 98–111)
Creatinine, Ser: 0.81 mg/dL (ref 0.61–1.24)
GFR, Estimated: >60 mL/min (ref >60)
Glucose, Bld: 70 mg/dL (ref 70–99)
Potassium: 3.9 mmol/L (ref 3.5–5.1)
Sodium: 138 mmol/L (ref 135–145)
Total Bilirubin: 1 mg/dL (ref 0.3–1.2)
Total Protein: 8 g/dL (ref 6.5–8.1)

## 2022-09-14 LAB — ETHANOL: Alcohol, Ethyl (B): 184 mg/dL — ABNORMAL HIGH (ref <10)

## 2022-09-14 LAB — ACETAMINOPHEN LEVEL: Acetaminophen (Tylenol), Serum: <10 ug/mL — ABNORMAL LOW (ref 10–30)

## 2022-09-14 LAB — RESP PANEL BY RT-PCR (FLU A&B, COVID) ARPGX2
Influenza A by PCR: NEGATIVE
Influenza B by PCR: NEGATIVE
SARS Coronavirus 2 by RT PCR: NEGATIVE

## 2022-09-14 LAB — SALICYLATE LEVEL: Salicylate Lvl: <7 mg/dL — ABNORMAL LOW (ref 7.0–30.0)

## 2022-09-14 MED ORDER — LORAZEPAM 2 MG/ML IJ SOLN: 0.0000 mg | Freq: Four times a day (QID) | INTRAMUSCULAR | Status: DC

## 2022-09-14 MED ORDER — THIAMINE MONONITRATE 100 MG PO TABS
100.0000 mg | ORAL_TABLET | Freq: Every day | ORAL | Status: DC
  Administered 2022-09-14 – 2022-09-15 (×2): 100 mg via ORAL
  Filled 2022-09-14 (×2): qty 1

## 2022-09-14 MED ORDER — SERTRALINE HCL 50 MG PO TABS
50.0000 mg | ORAL_TABLET | Freq: Every day | ORAL | Status: DC
  Administered 2022-09-14: 50 mg via ORAL
  Filled 2022-09-14: qty 1

## 2022-09-14 MED ORDER — LEVOTHYROXINE SODIUM 50 MCG PO TABS
175.0000 ug | ORAL_TABLET | Freq: Every day | ORAL | Status: DC
  Administered 2022-09-14: 175 ug via ORAL
  Filled 2022-09-14: qty 1

## 2022-09-14 MED ORDER — THIAMINE HCL 100 MG/ML IJ SOLN: 100.0000 mg | Freq: Every day | INTRAMUSCULAR | Status: DC

## 2022-09-14 MED ORDER — LORAZEPAM 1 MG PO TABS: 0.0000 mg | ORAL_TABLET | Freq: Two times a day (BID) | ORAL | Status: DC

## 2022-09-14 MED ORDER — LORAZEPAM 1 MG PO TABS
0.0000 mg | ORAL_TABLET | Freq: Four times a day (QID) | ORAL | Status: DC
  Administered 2022-09-14 – 2022-09-15 (×2): 2 mg via ORAL
  Filled 2022-09-14 (×2): qty 2

## 2022-09-14 MED ORDER — ZOLPIDEM TARTRATE 5 MG PO TABS
5.0000 mg | ORAL_TABLET | Freq: Every evening | ORAL | Status: DC | PRN
  Administered 2022-09-15: 5 mg via ORAL
  Filled 2022-09-14: qty 1

## 2022-09-14 MED ORDER — LORAZEPAM 2 MG/ML IJ SOLN: 0.0000 mg | Freq: Two times a day (BID) | INTRAMUSCULAR | Status: DC

## 2022-09-14 MED ORDER — ONDANSETRON HCL 4 MG PO TABS: 4.0000 mg | ORAL_TABLET | Freq: Three times a day (TID) | ORAL | Status: DC | PRN

## 2022-09-14 MED ORDER — ALUM & MAG HYDROXIDE-SIMETH 200-200-20 MG/5ML PO SUSP: 30.0000 mL | Freq: Four times a day (QID) | ORAL | Status: DC | PRN

## 2022-09-14 MED ORDER — ATORVASTATIN CALCIUM 40 MG PO TABS
80.0000 mg | ORAL_TABLET | Freq: Every day | ORAL | Status: DC
  Administered 2022-09-14 – 2022-09-15 (×2): 80 mg via ORAL
  Filled 2022-09-14 (×2): qty 2

## 2022-09-14 MED ORDER — ACETAMINOPHEN 325 MG PO TABS: 650.0000 mg | ORAL_TABLET | ORAL | Status: DC | PRN
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
## 2022-09-14 NOTE — ED Notes (Signed)
Pts belongings are located in cabinet labelled 16-18 Resus A. (2 Bags)
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
## 2022-09-15 ENCOUNTER — Ambulatory Visit (INDEPENDENT_AMBULATORY_CARE_PROVIDER_SITE_OTHER)
Admission: EM | Admit: 2022-09-15 | Discharge: 2022-09-15 | Disposition: A | Discharge status: Home / Self Care | Payer: 59 | Source: Home / Self Care

## 2022-09-15 DIAGNOSIS — F10982 Alcohol use, unspecified with alcohol-induced sleep disorder: Secondary | ICD-10-CM

## 2022-09-15 DIAGNOSIS — F1010 Alcohol abuse, uncomplicated: Secondary | ICD-10-CM

## 2022-09-15 DIAGNOSIS — F1090 Alcohol use, unspecified, uncomplicated: Secondary | ICD-10-CM | POA: Diagnosis not present

## 2022-09-15 DIAGNOSIS — Z79899 Other long term (current) drug therapy: Secondary | ICD-10-CM | POA: Insufficient documentation

## 2022-09-15 DIAGNOSIS — F32A Depression, unspecified: Secondary | ICD-10-CM | POA: Insufficient documentation

## 2022-09-15 DIAGNOSIS — F10182 Alcohol abuse with alcohol-induced sleep disorder: Secondary | ICD-10-CM | POA: Insufficient documentation

## 2022-09-15 LAB — FOLATE: Folate: 9.1 ng/mL (ref >5.9)

## 2022-09-15 LAB — TSH: TSH: 38.729 u[IU]/mL — ABNORMAL HIGH (ref 0.350–4.500)

## 2022-09-15 LAB — VITAMIN B12: Vitamin B-12: 285 pg/mL (ref 180–914)

## 2022-09-15 MED ORDER — TRAZODONE HCL 50 MG PO TABS: 50.0000 mg | ORAL_TABLET | Freq: Every day | ORAL | 0 refills | Status: DC

## 2022-09-15 NOTE — ED Notes (Signed)
Patient slept only about 1 hour even after ambien and ativan.
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
## 2022-09-15 NOTE — ED Provider Notes (Signed)
Emergency Medicine Observation Re-evaluation Note  Philip Humphrey is a 61 y.o. male, seen on rounds today.  Pt initially presented to the ED for complaints of IVC and Alcohol Problem Currently, the patient is eating breakfast.  Physical Exam  BP (!) 153/88 (BP Location: Right Arm)   Pulse (!) 105   Temp 97.9 F (36.6 C) (Oral)   Resp 18   Ht '5\' 10"'$  (1.778 m)   Wt 90.7 kg   SpO2 98%   BMI 28.70 kg/m  Physical Exam General: Awake, alert, calm Cardiac: Extremities well-perfused Lungs: Breathing is unlabored Psych: No agitation  ED Course / MDM  EKG:   I have reviewed the labs performed to date as well as medications administered while in observation.  Recent changes in the last 24 hours include presentation to the ED yesterday under IVC from daughter.  Daughter is concerned of his safety given his alcohol abuse, which has been worsened by recent psychosocial stressors.  TTS has evaluated and recommends inpatient psychiatric admission.  Plan  Current plan is for inpatient psychiatric admission.    Godfrey Pick, MD 09/15/22 [phone]
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
## 2022-09-15 NOTE — ED Notes (Signed)
Patient off unit to [facility] per provider. Patient alert, calm, cooperative, no s/s of distress. Discharge information and belongings given to [employer] for transport. [place] complete and went with patient. Patient ambulatory off unit, escorted and transported by [employer].Philip Humphrey

## 2022-09-15 NOTE — Discharge Instructions (Addendum)
Call: [facility] to schedule a therapy appointment. For insomnia, I am starting you on Trazodone 50 mg at bedtime. Your primary care provider will need to refill the medication. Alcoholics Anonymous in [place]  [phone]

## 2022-09-17 ENCOUNTER — Encounter: Payer: Self-pay | Admitting: Hematology and Oncology

## 2022-09-18 ENCOUNTER — Other Ambulatory Visit: Payer: Self-pay | Admitting: *Deleted

## 2022-09-18 ENCOUNTER — Encounter: Payer: Self-pay | Admitting: Hematology and Oncology

## 2022-09-20 ENCOUNTER — Encounter: Payer: Self-pay | Admitting: Hematology and Oncology

## 2022-09-21 ENCOUNTER — Inpatient Hospital Stay: Payer: 59 | Attending: Hematology and Oncology

## 2022-09-21 ENCOUNTER — Inpatient Hospital Stay: Payer: 59 | Admitting: Hematology and Oncology

## 2022-09-26 ENCOUNTER — Other Ambulatory Visit: Payer: Self-pay

## 2022-09-26 ENCOUNTER — Emergency Department (HOSPITAL_COMMUNITY)
Admission: EM | Admit: 2022-09-26 | Discharge: 2022-09-26 | Disposition: A | Discharge status: Home / Self Care | Payer: Medicare Other | Attending: Emergency Medicine | Admitting: Emergency Medicine

## 2022-09-26 ENCOUNTER — Emergency Department (HOSPITAL_COMMUNITY): Payer: Medicare Other

## 2022-09-26 ENCOUNTER — Encounter: Payer: Self-pay | Admitting: Hematology and Oncology

## 2022-09-26 DIAGNOSIS — Y907 Blood alcohol level of 200-239 mg/100 ml: Secondary | ICD-10-CM | POA: Diagnosis not present

## 2022-09-26 DIAGNOSIS — Z789 Other specified health status: Secondary | ICD-10-CM

## 2022-09-26 DIAGNOSIS — F1090 Alcohol use, unspecified, uncomplicated: Secondary | ICD-10-CM | POA: Insufficient documentation

## 2022-09-26 DIAGNOSIS — R42 Dizziness and giddiness: Secondary | ICD-10-CM | POA: Insufficient documentation

## 2022-09-26 DIAGNOSIS — D72819 Decreased white blood cell count, unspecified: Secondary | ICD-10-CM | POA: Insufficient documentation

## 2022-09-26 DIAGNOSIS — Z79899 Other long term (current) drug therapy: Secondary | ICD-10-CM | POA: Diagnosis not present

## 2022-09-26 LAB — RAPID URINE DRUG SCREEN, HOSP PERFORMED
Amphetamines: NOT DETECTED
Barbiturates: NOT DETECTED
Benzodiazepines: NOT DETECTED
Cocaine: NOT DETECTED
Opiates: NOT DETECTED
Tetrahydrocannabinol: NOT DETECTED

## 2022-09-26 LAB — ETHANOL: Alcohol, Ethyl (B): 226 mg/dL — ABNORMAL HIGH (ref <10)

## 2022-09-26 LAB — CBC WITH DIFFERENTIAL/PLATELET
Abs Immature Granulocytes: 0.02 10*3/uL (ref 0.00–0.07)
Basophils Absolute: 0 10*3/uL (ref 0.0–0.1)
Basophils Relative: 1 %
Eosinophils Absolute: 0.1 10*3/uL (ref 0.0–0.5)
Eosinophils Relative: 2 %
HCT: 34.5 % — ABNORMAL LOW (ref 39.0–52.0)
Hemoglobin: 11.8 g/dL — ABNORMAL LOW (ref 13.0–17.0)
Immature Granulocytes: 1 %
Lymphocytes Relative: 36 %
Lymphs Abs: 1.4 10*3/uL (ref 0.7–4.0)
MCH: 33.9 pg (ref 26.0–34.0)
MCHC: 34.2 g/dL (ref 30.0–36.0)
MCV: 99.1 fL (ref 80.0–100.0)
Monocytes Absolute: 0.5 10*3/uL (ref 0.1–1.0)
Monocytes Relative: 12 %
Neutro Abs: 1.9 10*3/uL (ref 1.7–7.7)
Neutrophils Relative %: 48 %
Platelets: 127 10*3/uL — ABNORMAL LOW (ref 150–400)
RBC: 3.48 MIL/uL — ABNORMAL LOW (ref 4.22–5.81)
RDW: 15.1 % (ref 11.5–15.5)
WBC: 3.9 10*3/uL — ABNORMAL LOW (ref 4.0–10.5)
nRBC: 0 % (ref 0.0–0.2)

## 2022-09-26 LAB — URINALYSIS, ROUTINE W REFLEX MICROSCOPIC
Bilirubin Urine: NEGATIVE
Glucose, UA: NEGATIVE mg/dL
Hgb urine dipstick: NEGATIVE
Ketones, ur: 20 mg/dL — AB
Leukocytes,Ua: NEGATIVE
Nitrite: NEGATIVE
Protein, ur: 100 mg/dL — AB
Specific Gravity, Urine: 1.019 (ref 1.005–1.030)
pH: 5 (ref 5.0–8.0)

## 2022-09-26 LAB — COMPREHENSIVE METABOLIC PANEL
ALT: 44 U/L (ref 0–44)
AST: 79 U/L — ABNORMAL HIGH (ref 15–41)
Albumin: 4 g/dL (ref 3.5–5.0)
Alkaline Phosphatase: 56 U/L (ref 38–126)
Anion gap: 12 (ref 5–15)
BUN: 8 mg/dL (ref 8–23)
CO2: 26 mmol/L (ref 22–32)
Calcium: 8.8 mg/dL — ABNORMAL LOW (ref 8.9–10.3)
Chloride: 103 mmol/L (ref 98–111)
Creatinine, Ser: 0.7 mg/dL (ref 0.61–1.24)
GFR, Estimated: >60 mL/min (ref >60)
Glucose, Bld: 89 mg/dL (ref 70–99)
Potassium: 3.2 mmol/L — ABNORMAL LOW (ref 3.5–5.1)
Sodium: 141 mmol/L (ref 135–145)
Total Bilirubin: 1.3 mg/dL — ABNORMAL HIGH (ref 0.3–1.2)
Total Protein: 6.8 g/dL (ref 6.5–8.1)

## 2022-09-26 LAB — TROPONIN I (HIGH SENSITIVITY)
Troponin I (High Sensitivity): 5 ng/L (ref <18)
Troponin I (High Sensitivity): 5 ng/L (ref <18)

## 2022-09-26 MED ORDER — SODIUM CHLORIDE 0.9 % IV BOLUS
1000.0000 mL | Freq: Once | INTRAVENOUS | Status: AC
  Administered 2022-09-26: 1000 mL via INTRAVENOUS

## 2022-09-26 NOTE — Discharge Instructions (Signed)
Follow-up outpatient, return for new or worsening symptoms 

## 2022-09-26 NOTE — ED Notes (Signed)
Urine requested  ?

## 2022-09-26 NOTE — ED Provider Notes (Signed)
[facility] DEPT Provider Note   CSN: [id number] Arrival date & time: 09/26/22  1550     History  Chief Complaint  Patient presents with   Dizziness    DARROLL BREDESON is a 61 y.o. male history of EtOH abuse, hemachromatosis s/p recurrent phlebotomy here for evaluation of intoxication dizziness.  Patient states he gets intermittent dizziness in the morning when he first wakes up.  Resolves after he is up for a few hours.  Typically only occurs and goes from sitting to standing.  He has a difficult time describing this.  He denies any numbness or weakness.  Typically occurs after he has been drinking alcohol.  He states he can go days without having any withdrawal symptoms.  Does typically state he drinks liquor at least every other day.  He does not want EtOH cessation at this time.  He denies any recent falls or injuries.  He states his family has been concerned about him. He does states that when he drinks heavily that he does not eat and drink water as much and he think that his intermittent dizziness is from this.  Of note family did IVC him approximately 2 weeks ago for EtOH abuse.  Patient denies any SI, HI, AVH.  Family does want patient to have inpatient treatment for his EtOH use however patient does not want this at this time.  He does states that typically he drinks due to the death of multiple family members he denies any severe depression.  He states he is sleeping at night.  Subsequently discharged from [facility] IVC was rescinded by psychiatry.  He states he is not currently followed outpatient by psych. HPI     Home Medications Prior to Admission medications   Medication Sig Start Date End Date Taking? Authorizing Provider  atorvastatin (LIPITOR) 80 MG tablet Take 80 mg by mouth daily. 10/09/16   [provider]  bismuth subsalicylate (PEPTO BISMOL) 262 MG/15ML suspension Take 30 mLs by mouth every 6 (six) hours as needed for indigestion  (stomach pain).    [provider]  calcium carbonate (TUMS - DOSED IN MG ELEMENTAL CALCIUM) 500 MG chewable tablet Chew 2 tablets by mouth at bedtime as needed for indigestion or heartburn.    [provider]  Cyanocobalamin (B-12) 1000 MCG SUBL Place 1,000 mcg under the tongue daily. 07/26/22   Lincoln Brigham, PA-C  diclofenac Sodium (VOLTAREN) 1 % GEL Apply 1 application topically 2 (two) times daily as needed (pain).    [provider]  diphenhydramine-acetaminophen (TYLENOL PM) 25-500 MG TABS tablet Take 1 tablet by mouth at bedtime as needed (sleep).    [provider]  famotidine (PEPCID) 40 MG tablet Take 40 mg by mouth daily as needed for heartburn or indigestion. 01/25/21   [provider]  folic acid (FOLVITE) 1 MG tablet Take 1 tablet (1 mg total) by mouth daily. 04/14/22   Thurnell Lose, MD  gabapentin (NEURONTIN) 300 MG capsule Take 300-600 mg by mouth 2 (two) times daily. Take 1 capsule (300 mg) in the morning and Take 2 capsules (600 mg) at bedtime    [provider]  hydrOXYzine (ATARAX/VISTARIL) 25 MG tablet Take 25 mg by mouth every 4 (four) hours as needed for anxiety. For itching 06/17/21   [provider]  lisinopril (ZESTRIL) 20 MG tablet Take 20 mg by mouth daily. 08/02/22   [provider]  Magnesium 400 MG TABS Take 400 mg by mouth  daily.    [provider]  MELATONIN PO Take 1 tablet by mouth at bedtime as needed (sleep).    [provider]  midodrine (PROAMATINE) 2.5 MG tablet Take 5 mg by mouth 3 (three) times daily. 09/03/22   [provider]  nitroGLYCERIN (NITROSTAT) 0.4 MG SL tablet Place 0.4 mg under the tongue every 5 (five) minutes as needed for chest pain.    [provider]  olmesartan (BENICAR) 20 MG tablet Take 20 mg by mouth daily. 08/14/22   [provider]  sertraline (ZOLOFT) 50 MG tablet Take 50 mg by mouth at bedtime.    [provider]   SYNTHROID 175 MCG tablet Take 1 tablet (175 mcg total) by mouth daily. NO MORE REFILLS WITHOUT OFFICE VISIT - 2ND NOTICE Patient taking differently: Take 175 mcg by mouth daily. 11/14/14   Leandrew Koyanagi, MD  thiamine 100 MG tablet Take 1 tablet (100 mg total) by mouth daily. Patient not taking: Reported on 09/14/2022 04/14/22   Thurnell Lose, MD  traZODone (DESYREL) 50 MG tablet Take 1 tablet (50 mg total) by mouth at bedtime. 09/15/22   Scot Jun, FNP  Vitamin D, Ergocalciferol, (DRISDOL) 1.25 MG (50000 UNIT) CAPS capsule Take 50,000 Units by mouth every 7 (seven) days. 05/14/21   [provider]      Allergies    Sulfa antibiotics and Oxycodone    Review of Systems   Review of Systems  Constitutional: Negative.   HENT: Negative.    Respiratory: Negative.    Cardiovascular: Negative.   Gastrointestinal: Negative.   Genitourinary: Negative.   Musculoskeletal: Negative.   Skin: Negative.   Neurological:  Positive for dizziness (resolved).  All other systems reviewed and are negative.   Physical Exam Updated Vital Signs BP 136/89 (BP Location: Right Arm)   Pulse 90   Temp 98.6 F (37 C) (Oral)   Resp 18   Ht '5\' 10"'$  (1.778 m)   Wt 86.2 kg   SpO2 99%   BMI 27.26 kg/m  Physical Exam Vitals and nursing note reviewed.  Constitutional:      General: He is not in acute distress.    Appearance: He is well-developed. He is not ill-appearing, toxic-appearing or diaphoretic.     Comments: Smells of alcohol  HENT:     Head: Normocephalic and atraumatic.     Nose: Nose normal.     Mouth/Throat:     Mouth: Mucous membranes are moist.  Eyes:     Pupils: Pupils are equal, round, and reactive to light.  Cardiovascular:     Rate and Rhythm: Normal rate and regular rhythm.     Pulses: Normal pulses.     Heart sounds: Normal heart sounds.  Pulmonary:     Effort: Pulmonary effort is normal. No respiratory distress.     Breath sounds: Normal breath sounds.   Abdominal:     General: Bowel sounds are normal. There is no distension.     Palpations: Abdomen is soft.     Tenderness: There is no abdominal tenderness. There is no right CVA tenderness, left CVA tenderness, guarding or rebound.  Musculoskeletal:        General: Normal range of motion.     Cervical back: Normal range of motion and neck supple.  Skin:    General: Skin is warm and dry.     Capillary Refill: Capillary refill takes less than 2 seconds.  Neurological:     General: No focal  deficit present.     Mental Status: He is alert and oriented to person, place, and time.     Cranial Nerves: Cranial nerves 2-12 are intact.     Sensory: Sensation is intact.     Motor: Motor function is intact.     Comments: CN 2-12 grossly intact Equal strength Intact sensation Ambulatory without ataxiz     ED Results / Procedures / Treatments   Labs (all labs ordered are listed, but only abnormal results are displayed) Labs Reviewed  CBC WITH DIFFERENTIAL/PLATELET - Abnormal; Notable for the following components:      Result Value   WBC 3.9 (*)    RBC 3.48 (*)    Hemoglobin 11.8 (*)    HCT 34.5 (*)    Platelets 127 (*)    All other components within normal limits  COMPREHENSIVE METABOLIC PANEL - Abnormal; Notable for the following components:   Potassium 3.2 (*)    Calcium 8.8 (*)    AST 79 (*)    Total Bilirubin 1.3 (*)    All other components within normal limits  ETHANOL - Abnormal; Notable for the following components:   Alcohol, Ethyl (B) 226 (*)    All other components within normal limits  URINALYSIS, ROUTINE W REFLEX MICROSCOPIC - Abnormal; Notable for the following components:   Color, Urine AMBER (*)    APPearance CLOUDY (*)    Ketones, ur 20 (*)    Protein, ur 100 (*)    Bacteria, UA RARE (*)    All other components within normal limits  RAPID URINE DRUG SCREEN, HOSP PERFORMED  TROPONIN I (HIGH SENSITIVITY)  TROPONIN I (HIGH SENSITIVITY)    EKG EKG  Interpretation  Date/Time:  Saturday September 26 2022 16:05:14 EDT Ventricular Rate:  92 PR Interval:  154 QRS Duration: 84 QT Interval:  374 QTC Calculation: 462 R Axis:   17 Text Interpretation: Normal sinus rhythm Normal ECG When compared with ECG of 14-Sep-2022 19:01, PREVIOUS ECG IS PRESENT Confirmed by Nanda Quinton [phone]) on 09/26/2022 4:09:08 PM  Radiology CT HEAD WO CONTRAST (5MM)  Result Date: 09/26/2022 CLINICAL DATA:  Dizziness. EXAM: CT HEAD WITHOUT CONTRAST TECHNIQUE: Contiguous axial images were obtained from the base of the skull through the vertex without intravenous contrast. RADIATION DOSE REDUCTION: This exam was performed according to the departmental dose-optimization program which includes automated exposure control, adjustment of the mA and/or kV according to patient size and/or use of iterative reconstruction technique. COMPARISON:  08/10/2022. FINDINGS: Brain: No evidence of acute infarction, hemorrhage, hydrocephalus, extra-axial collection or mass lesion/mass effect. Vascular: No hyperdense vessel or unexpected calcification. Skull: Normal. Negative for fracture or focal lesion. Sinuses/Orbits: Globes and orbits are unremarkable. Visualized sinuses are clear. Other: None. IMPRESSION: No acute intracranial abnormalities. Electronically Signed   By: Lajean Manes M.D.   On: 09/26/2022 16:42    Procedures Procedures    Medications Ordered in ED Medications  sodium chloride 0.9 % bolus 1,000 mL (0 mLs Intravenous Stopped 09/26/22 1905)    ED Course/ Medical Decision Making/ A&P    61 year old here for evaluation of EtOH use and dizziness.  History of similar.  States typically when he drinks large amounts he has back on eating and drinking water.  Symptoms typically occur in the morning and self resolved.  Occur when he goes from sitting to standing.  He denies any chest pain, shortness of breath.  He has no current dizziness.  Denies any illicit substance use.   States family called  EMS because they want him to have inpatient treatment for his alcohol use however he denies need for this.  He was actually IVC by his family 2 weeks ago for similar he was seen by psychiatry who rescinded his IVC.  He denies any SI, HI, AVH.  He does appear to have good insight to his alcohol use.  Does state he drinks due to losses of multiple family members.  He states he can go for long periods of time without drinking without any withdrawal symptoms.  Denies any recent falls or injuries.  He has a nonfocal neuro exam without deficits.  Heart and lungs clear.  Abdomen soft, nontender.  Does state that he does not intend to abstain from alcohol at this time.  He has had outpatient referrals placed previously for EtOH use however he is not interested in this.  Labs and imaging personally viewed and interpreted:  CBC leukopenia 3.9, hemoglobin 11.8, history of hemochromatosis s/p recurrent phlebotomy.  He denies any melena or blood per rectum. Patient defers occult to assess for bleeding. Metabolic panel potassium 3.2, mild elevation in LFTs, AST 7.9, T. bili 1.3, no abdominal pain or vomiting.  Suspect this is due to his alcohol use Troponin 3 CT head without significant [place] Ethanol 226 EKG without ischemic changes  Patient reassessed.  Feels improved after IV fluids.  He is ambulatory without difficulty, tolerating p.o. intake.  Pending delta troponin, urine  Patient reassessed.  States he wants to leave.  At this time he has capacity to make medical decisions.  He is ambulatory.  A&O x3.  He denies any SI, HI, AVH.  He is not interested in inpatient treatment for his alcohol use.  He does not meet criteria for IVC at this time.  He understands he is leaving with pending troponin levels.  He is understanding of risk versus benefit.  After patient left delta troponin flat, UDS negative, urine negative for UTI.  I feel likely his intermittent dizziness likely due to  dehydration, EtOH use.  At this time I low suspicion for acute ACS, PE, dissection, CVA, posterior circulation etiology, bacterial infectious process.  The patient has been appropriately medically screened and/or stabilized in the ED. I have low suspicion for any other emergent medical condition which would require further screening, evaluation or treatment in the ED or require inpatient management.  Patient is hemodynamically stable and in no acute distress.  Patient able to ambulate in department prior to ED.  Evaluation does not show acute pathology that would require ongoing or additional emergent interventions while in the emergency department or further inpatient treatment.  I have discussed the diagnosis with the patient and answered all questions.  Pain is been managed while in the emergency department and patient has no further complaints prior to discharge.  Patient is comfortable with plan discussed in room and is stable for discharge at this time.  I have discussed strict return precautions for returning to the emergency department.  Patient was encouraged to follow-up with PCP/specialist refer to at discharge.                            Medical Decision Making Amount and/or Complexity of Data Reviewed External Data Reviewed: labs, radiology, ECG and notes. Labs: ordered. Decision-making details documented in ED Course. Radiology: ordered and independent interpretation performed. Decision-making details documented in ED Course. ECG/medicine tests: ordered and independent interpretation performed. Decision-making details documented in ED Course.  Risk OTC drugs. Prescription drug management. Parenteral controlled substances. Decision regarding hospitalization. Diagnosis or treatment significantly limited by social determinants of health.          Final Clinical Impression(s) / ED Diagnoses Final diagnoses:  Dizziness  Alcohol use    Rx / DC Orders ED Discharge Orders      None         Dishon Kehoe A, PA-C 09/26/22 Reuben Likes, MD 09/28/22 1036

## 2022-09-26 NOTE — ED Triage Notes (Signed)
Ems brings pt in from home for dizziness. Family concerned about pt drinking and states pt seems more sleepy.

## 2022-09-30 ENCOUNTER — Encounter: Payer: Self-pay | Admitting: Hematology and Oncology

## 2023-02-05 ENCOUNTER — Encounter: Payer: Self-pay | Admitting: Hematology and Oncology

## 2023-07-23 ENCOUNTER — Inpatient Hospital Stay (HOSPITAL_COMMUNITY)
Admission: EM | Admit: 2023-07-23 | Discharge: 2023-07-26 | DRG: 641 | Discharge status: Against Medical Advice | Payer: Medicare Other | Attending: Internal Medicine | Admitting: Internal Medicine

## 2023-07-23 ENCOUNTER — Emergency Department (HOSPITAL_COMMUNITY): Payer: Medicare Other

## 2023-07-23 ENCOUNTER — Other Ambulatory Visit: Payer: Self-pay

## 2023-07-23 ENCOUNTER — Encounter (HOSPITAL_COMMUNITY): Payer: Self-pay

## 2023-07-23 DIAGNOSIS — G6289 Other specified polyneuropathies: Secondary | ICD-10-CM

## 2023-07-23 DIAGNOSIS — E871 Hypo-osmolality and hyponatremia: Secondary | ICD-10-CM | POA: Diagnosis present

## 2023-07-23 DIAGNOSIS — D539 Nutritional anemia, unspecified: Secondary | ICD-10-CM | POA: Diagnosis present

## 2023-07-23 DIAGNOSIS — R296 Repeated falls: Secondary | ICD-10-CM | POA: Diagnosis present

## 2023-07-23 DIAGNOSIS — E876 Hypokalemia: Principal | ICD-10-CM | POA: Diagnosis present

## 2023-07-23 DIAGNOSIS — E860 Dehydration: Secondary | ICD-10-CM | POA: Diagnosis present

## 2023-07-23 DIAGNOSIS — W2201XA Walked into wall, initial encounter: Secondary | ICD-10-CM | POA: Diagnosis present

## 2023-07-23 DIAGNOSIS — Y900 Blood alcohol level of less than 20 mg/100 ml: Secondary | ICD-10-CM | POA: Diagnosis present

## 2023-07-23 DIAGNOSIS — Z885 Allergy status to narcotic agent status: Secondary | ICD-10-CM

## 2023-07-23 DIAGNOSIS — Y92009 Unspecified place in unspecified non-institutional (private) residence as the place of occurrence of the external cause: Secondary | ICD-10-CM | POA: Diagnosis not present

## 2023-07-23 DIAGNOSIS — D693 Immune thrombocytopenic purpura: Secondary | ICD-10-CM | POA: Diagnosis present

## 2023-07-23 DIAGNOSIS — R55 Syncope and collapse: Secondary | ICD-10-CM | POA: Diagnosis present

## 2023-07-23 DIAGNOSIS — Z87891 Personal history of nicotine dependence: Secondary | ICD-10-CM | POA: Diagnosis not present

## 2023-07-23 DIAGNOSIS — I10 Essential (primary) hypertension: Secondary | ICD-10-CM | POA: Diagnosis present

## 2023-07-23 DIAGNOSIS — Z9181 History of falling: Secondary | ICD-10-CM

## 2023-07-23 DIAGNOSIS — I959 Hypotension, unspecified: Secondary | ICD-10-CM | POA: Insufficient documentation

## 2023-07-23 DIAGNOSIS — K219 Gastro-esophageal reflux disease without esophagitis: Secondary | ICD-10-CM | POA: Diagnosis present

## 2023-07-23 DIAGNOSIS — F32A Depression, unspecified: Secondary | ICD-10-CM | POA: Diagnosis present

## 2023-07-23 DIAGNOSIS — D72829 Elevated white blood cell count, unspecified: Secondary | ICD-10-CM | POA: Insufficient documentation

## 2023-07-23 DIAGNOSIS — Z5321 Procedure and treatment not carried out due to patient leaving prior to being seen by health care provider: Secondary | ICD-10-CM | POA: Diagnosis present

## 2023-07-23 DIAGNOSIS — Z8249 Family history of ischemic heart disease and other diseases of the circulatory system: Secondary | ICD-10-CM | POA: Diagnosis not present

## 2023-07-23 DIAGNOSIS — Z823 Family history of stroke: Secondary | ICD-10-CM | POA: Diagnosis not present

## 2023-07-23 DIAGNOSIS — F411 Generalized anxiety disorder: Secondary | ICD-10-CM | POA: Diagnosis present

## 2023-07-23 DIAGNOSIS — D649 Anemia, unspecified: Secondary | ICD-10-CM | POA: Diagnosis not present

## 2023-07-23 DIAGNOSIS — F1010 Alcohol abuse, uncomplicated: Secondary | ICD-10-CM | POA: Diagnosis present

## 2023-07-23 DIAGNOSIS — E890 Postprocedural hypothyroidism: Secondary | ICD-10-CM | POA: Diagnosis present

## 2023-07-23 DIAGNOSIS — E538 Deficiency of other specified B group vitamins: Secondary | ICD-10-CM | POA: Diagnosis present

## 2023-07-23 DIAGNOSIS — Z96643 Presence of artificial hip joint, bilateral: Secondary | ICD-10-CM | POA: Diagnosis present

## 2023-07-23 DIAGNOSIS — E878 Other disorders of electrolyte and fluid balance, not elsewhere classified: Secondary | ICD-10-CM | POA: Diagnosis present

## 2023-07-23 DIAGNOSIS — E785 Hyperlipidemia, unspecified: Secondary | ICD-10-CM | POA: Diagnosis present

## 2023-07-23 DIAGNOSIS — Z79890 Hormone replacement therapy (postmenopausal): Secondary | ICD-10-CM

## 2023-07-23 DIAGNOSIS — K760 Fatty (change of) liver, not elsewhere classified: Secondary | ICD-10-CM | POA: Diagnosis present

## 2023-07-23 DIAGNOSIS — G629 Polyneuropathy, unspecified: Secondary | ICD-10-CM | POA: Diagnosis present

## 2023-07-23 DIAGNOSIS — R42 Dizziness and giddiness: Secondary | ICD-10-CM | POA: Diagnosis not present

## 2023-07-23 DIAGNOSIS — Z79899 Other long term (current) drug therapy: Secondary | ICD-10-CM

## 2023-07-23 DIAGNOSIS — Z882 Allergy status to sulfonamides status: Secondary | ICD-10-CM

## 2023-07-23 DIAGNOSIS — F1090 Alcohol use, unspecified, uncomplicated: Secondary | ICD-10-CM | POA: Diagnosis not present

## 2023-07-23 DIAGNOSIS — E039 Hypothyroidism, unspecified: Secondary | ICD-10-CM

## 2023-07-23 DIAGNOSIS — Z888 Allergy status to other drugs, medicaments and biological substances status: Secondary | ICD-10-CM

## 2023-07-23 LAB — CBC
HCT: 25.1 % — ABNORMAL LOW (ref 39.0–52.0)
Hemoglobin: 8.7 g/dL — ABNORMAL LOW (ref 13.0–17.0)
MCH: 38.3 pg — ABNORMAL HIGH (ref 26.0–34.0)
MCHC: 34.7 g/dL (ref 30.0–36.0)
MCV: 110.6 fL — ABNORMAL HIGH (ref 80.0–100.0)
Platelets: 123 10*3/uL — ABNORMAL LOW (ref 150–400)
RBC: 2.27 MIL/uL — ABNORMAL LOW (ref 4.22–5.81)
RDW: 17.6 % — ABNORMAL HIGH (ref 11.5–15.5)
WBC: 17.4 10*3/uL — ABNORMAL HIGH (ref 4.0–10.5)
nRBC: 0.2 % (ref 0.0–0.2)

## 2023-07-23 LAB — FOLATE: Folate: 2.3 ng/mL — ABNORMAL LOW (ref >5.9)

## 2023-07-23 LAB — MAGNESIUM: Magnesium: 1.6 mg/dL — ABNORMAL LOW (ref 1.7–2.4)

## 2023-07-23 LAB — IRON AND TIBC
Iron: 179 ug/dL (ref 45–182)
Saturation Ratios: 90 % — ABNORMAL HIGH (ref 17.9–39.5)
TIBC: 199 ug/dL — ABNORMAL LOW (ref 250–450)
UIBC: 20 ug/dL

## 2023-07-23 LAB — DIFFERENTIAL
Abs Immature Granulocytes: 0.12 10*3/uL — ABNORMAL HIGH (ref 0.00–0.07)
Basophils Absolute: 0.1 10*3/uL (ref 0.0–0.1)
Basophils Relative: 0 %
Eosinophils Absolute: 0.9 10*3/uL — ABNORMAL HIGH (ref 0.0–0.5)
Eosinophils Relative: 5 %
Immature Granulocytes: 1 %
Lymphocytes Relative: 5 %
Lymphs Abs: 0.9 10*3/uL (ref 0.7–4.0)
Monocytes Absolute: 0.9 10*3/uL (ref 0.1–1.0)
Monocytes Relative: 5 %
Neutro Abs: 14.6 10*3/uL — ABNORMAL HIGH (ref 1.7–7.7)
Neutrophils Relative %: 84 %

## 2023-07-23 LAB — HEPATIC FUNCTION PANEL
ALT: 27 U/L (ref 0–44)
AST: 54 U/L — ABNORMAL HIGH (ref 15–41)
Albumin: 3.8 g/dL (ref 3.5–5.0)
Alkaline Phosphatase: 154 U/L — ABNORMAL HIGH (ref 38–126)
Bilirubin, Direct: 1.1 mg/dL — ABNORMAL HIGH (ref 0.0–0.2)
Indirect Bilirubin: 2.3 mg/dL — ABNORMAL HIGH (ref 0.3–0.9)
Total Bilirubin: 3.4 mg/dL — ABNORMAL HIGH (ref 0.3–1.2)
Total Protein: 7 g/dL (ref 6.5–8.1)

## 2023-07-23 LAB — BASIC METABOLIC PANEL
Anion gap: 22 — ABNORMAL HIGH (ref 5–15)
BUN: 11 mg/dL (ref 8–23)
CO2: 24 mmol/L (ref 22–32)
Calcium: 9 mg/dL (ref 8.9–10.3)
Chloride: 86 mmol/L — ABNORMAL LOW (ref 98–111)
Creatinine, Ser: 0.91 mg/dL (ref 0.61–1.24)
GFR, Estimated: >60 mL/min (ref >60)
Glucose, Bld: 134 mg/dL — ABNORMAL HIGH (ref 70–99)
Potassium: 2.5 mmol/L — CL (ref 3.5–5.1)
Sodium: 132 mmol/L — ABNORMAL LOW (ref 135–145)

## 2023-07-23 LAB — PHOSPHORUS: Phosphorus: 1.8 mg/dL — ABNORMAL LOW (ref 2.5–4.6)

## 2023-07-23 LAB — VITAMIN B12: Vitamin B-12: 319 pg/mL (ref 180–914)

## 2023-07-23 LAB — LACTIC ACID, PLASMA: Lactic Acid, Venous: 1.6 mmol/L (ref 0.5–1.9)

## 2023-07-23 LAB — FERRITIN: Ferritin: 372 ng/mL — ABNORMAL HIGH (ref 24–336)

## 2023-07-23 LAB — LACTATE DEHYDROGENASE: LDH: 107 U/L (ref 98–192)

## 2023-07-23 LAB — TSH: TSH: 13.977 u[IU]/mL — ABNORMAL HIGH (ref 0.350–4.500)

## 2023-07-23 LAB — CBG MONITORING, ED: Glucose-Capillary: 138 mg/dL — ABNORMAL HIGH (ref 70–99)

## 2023-07-23 MED ORDER — POTASSIUM CHLORIDE CRYS ER 20 MEQ PO TBCR
40.0000 meq | EXTENDED_RELEASE_TABLET | Freq: Once | ORAL | Status: AC
  Administered 2023-07-23: 40 meq via ORAL
  Filled 2023-07-23: qty 2

## 2023-07-23 MED ORDER — SODIUM CHLORIDE 0.9 % IV BOLUS
1000.0000 mL | Freq: Once | INTRAVENOUS | Status: AC
  Administered 2023-07-23: 1000 mL via INTRAVENOUS

## 2023-07-23 MED ORDER — SODIUM CHLORIDE 0.9% FLUSH
3.0000 mL | Freq: Two times a day (BID) | INTRAVENOUS | Status: DC
  Administered 2023-07-23 – 2023-07-26 (×5): 3 mL via INTRAVENOUS

## 2023-07-23 MED ORDER — SODIUM CHLORIDE 0.9% FLUSH: 3.0000 mL | INTRAVENOUS | Status: DC | PRN

## 2023-07-23 MED ORDER — POTASSIUM CHLORIDE 10 MEQ/100ML IV SOLN
10.0000 meq | Freq: Once | INTRAVENOUS | Status: AC
  Administered 2023-07-23: 10 meq via INTRAVENOUS
  Filled 2023-07-23: qty 100

## 2023-07-23 MED ORDER — FAMOTIDINE 20 MG PO TABS: 40.0000 mg | ORAL_TABLET | Freq: Every day | ORAL | Status: DC | PRN

## 2023-07-23 MED ORDER — CALCIUM CARBONATE ANTACID 500 MG PO CHEW: 2.0000 | CHEWABLE_TABLET | Freq: Every evening | ORAL | Status: DC | PRN

## 2023-07-23 MED ORDER — ONDANSETRON HCL 4 MG/2ML IJ SOLN: 4.0000 mg | Freq: Four times a day (QID) | INTRAMUSCULAR | Status: DC | PRN

## 2023-07-23 MED ORDER — ACETAMINOPHEN 650 MG RE SUPP: 650.0000 mg | Freq: Four times a day (QID) | RECTAL | Status: DC | PRN

## 2023-07-23 MED ORDER — IOHEXOL 300 MG/ML  SOLN
100.0000 mL | Freq: Once | INTRAMUSCULAR | Status: AC | PRN
  Administered 2023-07-23: 100 mL via INTRAVENOUS

## 2023-07-23 MED ORDER — MELATONIN 5 MG PO TABS
5.0000 mg | ORAL_TABLET | Freq: Every evening | ORAL | Status: DC | PRN
  Administered 2023-07-24 – 2023-07-25 (×2): 5 mg via ORAL
  Filled 2023-07-23 (×2): qty 1

## 2023-07-23 MED ORDER — ENOXAPARIN SODIUM 40 MG/0.4ML IJ SOSY: 40.0000 mg | PREFILLED_SYRINGE | INTRAMUSCULAR | Status: DC

## 2023-07-23 MED ORDER — BISMUTH SUBSALICYLATE 262 MG/15ML PO SUSP: 30.0000 mL | Freq: Four times a day (QID) | ORAL | Status: DC | PRN

## 2023-07-23 MED ORDER — VITAMIN B-12 1000 MCG PO TABS
1000.0000 ug | ORAL_TABLET | Freq: Every day | ORAL | Status: DC
  Administered 2023-07-25 – 2023-07-26 (×2): 1000 ug via ORAL
  Filled 2023-07-23: qty 1
  Filled 2023-07-23 (×2): qty 10
  Filled 2023-07-23: qty 1

## 2023-07-23 MED ORDER — ALUM & MAG HYDROXIDE-SIMETH 200-200-20 MG/5ML PO SUSP
30.0000 mL | Freq: Once | ORAL | Status: AC
  Administered 2023-07-23: 30 mL via ORAL
  Filled 2023-07-23: qty 30

## 2023-07-23 MED ORDER — ATORVASTATIN CALCIUM 40 MG PO TABS
80.0000 mg | ORAL_TABLET | Freq: Every day | ORAL | Status: DC
  Administered 2023-07-23: 80 mg via ORAL
  Filled 2023-07-23: qty 2

## 2023-07-23 MED ORDER — SODIUM CHLORIDE 0.9 % IV SOLN: INTRAVENOUS | Status: AC

## 2023-07-23 MED ORDER — POTASSIUM CHLORIDE 10 MEQ/100ML IV SOLN
10.0000 meq | INTRAVENOUS | Status: AC
  Administered 2023-07-23 – 2023-07-24 (×4): 10 meq via INTRAVENOUS
  Filled 2023-07-23 (×4): qty 100

## 2023-07-23 MED ORDER — ACETAMINOPHEN 325 MG PO TABS
650.0000 mg | ORAL_TABLET | Freq: Four times a day (QID) | ORAL | Status: DC | PRN
  Administered 2023-07-24 (×2): 650 mg via ORAL
  Filled 2023-07-23 (×3): qty 2

## 2023-07-23 MED ORDER — HYDROXYZINE HCL 25 MG PO TABS
25.0000 mg | ORAL_TABLET | ORAL | Status: DC | PRN
  Administered 2023-07-24 – 2023-07-25 (×2): 25 mg via ORAL
  Filled 2023-07-23 (×2): qty 1

## 2023-07-23 MED ORDER — POTASSIUM & SODIUM PHOSPHATES 280-160-250 MG PO PACK
1.0000 | PACK | Freq: Three times a day (TID) | ORAL | Status: AC
  Administered 2023-07-24 (×2): 1 via ORAL
  Filled 2023-07-23 (×5): qty 1

## 2023-07-23 MED ORDER — ONDANSETRON HCL 4 MG PO TABS: 4.0000 mg | ORAL_TABLET | Freq: Four times a day (QID) | ORAL | Status: DC | PRN

## 2023-07-23 MED ORDER — FOLIC ACID 1 MG PO TABS
1.0000 mg | ORAL_TABLET | Freq: Every day | ORAL | Status: DC
  Administered 2023-07-24 – 2023-07-26 (×3): 1 mg via ORAL
  Filled 2023-07-23 (×3): qty 1

## 2023-07-23 MED ORDER — SODIUM CHLORIDE 0.9 % IV SOLN: 250.0000 mL | INTRAVENOUS | Status: DC | PRN

## 2023-07-23 MED ORDER — LEVOTHYROXINE SODIUM 25 MCG PO TABS
175.0000 ug | ORAL_TABLET | Freq: Every day | ORAL | Status: DC
  Administered 2023-07-24 – 2023-07-26 (×3): 175 ug via ORAL
  Filled 2023-07-23 (×3): qty 1

## 2023-07-23 MED ORDER — SERTRALINE HCL 50 MG PO TABS
50.0000 mg | ORAL_TABLET | Freq: Every day | ORAL | Status: DC
  Administered 2023-07-23: 50 mg via ORAL
  Filled 2023-07-23: qty 1

## 2023-07-23 MED ORDER — SENNOSIDES-DOCUSATE SODIUM 8.6-50 MG PO TABS: 1.0000 | ORAL_TABLET | Freq: Every evening | ORAL | Status: DC | PRN

## 2023-07-23 MED ORDER — MAGNESIUM SULFATE 2 GM/50ML IV SOLN
2.0000 g | Freq: Once | INTRAVENOUS | Status: AC
  Administered 2023-07-24: 2 g via INTRAVENOUS
  Filled 2023-07-23: qty 50

## 2023-07-23 NOTE — ED Triage Notes (Addendum)
Patient BIB [facility] from home. Dizziness on and off for a year. This episode began 2 days ago. [name] he has also been falling more due to the dizziness.

## 2023-07-23 NOTE — ED Notes (Signed)
He states he can't urinate. I have asked him twice since he has been here.

## 2023-07-23 NOTE — ED Provider Notes (Signed)
[facility] Provider Note   CSN: [id number] Arrival date & time: 07/23/23  1547     History {Add pertinent medical, surgical, social history, OB history to HPI:1} Chief Complaint  Patient presents with   Dizziness    Philip Humphrey is a 62 y.o. male.  Patient complains of dizziness and weakness.  He recently had a fall and hit his head.   Dizziness      Home Medications Prior to Admission medications   Medication Sig Start Date End Date Taking? Authorizing Provider  atorvastatin (LIPITOR) 80 MG tablet Take 80 mg by mouth daily. 10/09/16   [provider]  bismuth subsalicylate (PEPTO BISMOL) 262 MG/15ML suspension Take 30 mLs by mouth every 6 (six) hours as needed for indigestion (stomach pain).    [provider]  calcium carbonate (TUMS - DOSED IN MG ELEMENTAL CALCIUM) 500 MG chewable tablet Chew 2 tablets by mouth at bedtime as needed for indigestion or heartburn.    [provider]  Cyanocobalamin (B-12) 1000 MCG SUBL Place 1,000 mcg under the tongue daily. 07/26/22   Briant Cedar, PA-C  diclofenac Sodium (VOLTAREN) 1 % GEL Apply 1 application topically 2 (two) times daily as needed (pain).    [provider]  diphenhydramine-acetaminophen (TYLENOL PM) 25-500 MG TABS tablet Take 1 tablet by mouth at bedtime as needed (sleep).    [provider]  famotidine (PEPCID) 40 MG tablet Take 40 mg by mouth daily as needed for heartburn or indigestion. 01/25/21   [provider]  folic acid (FOLVITE) 1 MG tablet Take 1 tablet (1 mg total) by mouth daily. 04/14/22   Leroy Sea, MD  gabapentin (NEURONTIN) 300 MG capsule Take 300-600 mg by mouth 2 (two) times daily. Take 1 capsule (300 mg) in the morning and Take 2 capsules (600 mg) at bedtime    [provider]  hydrOXYzine (ATARAX/VISTARIL) 25 MG tablet Take 25 mg by mouth every 4 (four) hours as needed for anxiety. For  itching 06/17/21   [provider]  lisinopril (ZESTRIL) 20 MG tablet Take 20 mg by mouth daily. 08/02/22   [provider]  Magnesium 400 MG TABS Take 400 mg by mouth daily.    [provider]  MELATONIN PO Take 1 tablet by mouth at bedtime as needed (sleep).    [provider]  midodrine (PROAMATINE) 2.5 MG tablet Take 5 mg by mouth 3 (three) times daily. 09/03/22   [provider]  nitroGLYCERIN (NITROSTAT) 0.4 MG SL tablet Place 0.4 mg under the tongue every 5 (five) minutes as needed for chest pain.    [provider]  olmesartan (BENICAR) 20 MG tablet Take 20 mg by mouth daily. 08/14/22   [provider]  sertraline (ZOLOFT) 50 MG tablet Take 50 mg by mouth at bedtime.    [provider]  SYNTHROID 175 MCG tablet Take 1 tablet (175 mcg total) by mouth daily. NO MORE REFILLS WITHOUT OFFICE VISIT - 2ND NOTICE Patient taking differently: Take 175 mcg by mouth daily. 11/14/14   Tonye Pearson, MD  thiamine 100 MG tablet Take 1 tablet (100 mg total) by mouth daily. Patient not taking: Reported on 09/14/2022 04/14/22   Leroy Sea, MD  traZODone (DESYREL) 50 MG tablet Take 1 tablet (50 mg total) by mouth at bedtime. 09/15/22   Bing Neighbors, NP  Vitamin D, Ergocalciferol, (DRISDOL) 1.25 MG (50000 UNIT) CAPS capsule Take 50,000  Units by mouth every 7 (seven) days. 05/14/21   [provider]      Allergies    Sulfa antibiotics and Oxycodone    Review of Systems   Review of Systems  Neurological:  Positive for dizziness.    Physical Exam Updated Vital Signs BP 109/79   Pulse (!) 106   Temp 98.3 F (36.8 C) (Oral)   Resp 16   Ht 5' 9.5" (1.765 m)   Wt 86.2 kg   SpO2 100%   BMI 27.66 kg/m  Physical Exam  ED Results / Procedures / Treatments   Labs (all labs ordered are listed, but only abnormal results are displayed) Labs Reviewed  BASIC METABOLIC PANEL - Abnormal; Notable for the following  components:      Result Value   Sodium 132 (*)    Potassium 2.5 (*)    Chloride 86 (*)    Glucose, Bld 134 (*)    Anion gap 22 (*)    All other components within normal limits  CBC - Abnormal; Notable for the following components:   WBC 17.4 (*)    RBC 2.27 (*)    Hemoglobin 8.7 (*)    HCT 25.1 (*)    MCV 110.6 (*)    MCH 38.3 (*)    RDW 17.6 (*)    Platelets 123 (*)    All other components within normal limits  DIFFERENTIAL - Abnormal; Notable for the following components:   Neutro Abs 14.6 (*)    Eosinophils Absolute 0.9 (*)    Abs Immature Granulocytes 0.12 (*)    All other components within normal limits  HEPATIC FUNCTION PANEL - Abnormal; Notable for the following components:   AST 54 (*)    Alkaline Phosphatase 154 (*)    Total Bilirubin 3.4 (*)    Bilirubin, Direct 1.1 (*)    Indirect Bilirubin 2.3 (*)    All other components within normal limits  CBG MONITORING, ED - Abnormal; Notable for the following components:   Glucose-Capillary 138 (*)    All other components within normal limits  URINALYSIS, ROUTINE W REFLEX MICROSCOPIC  RAPID URINE DRUG SCREEN, HOSP PERFORMED  ETHANOL  OCCULT BLOOD X 1 CARD TO LAB, STOOL  LACTIC ACID, PLASMA  VITAMIN B12  FOLATE  IRON AND TIBC  FERRITIN  RETICULOCYTES  LACTATE DEHYDROGENASE    EKG None  Radiology CT ABDOMEN PELVIS W CONTRAST  Result Date: 07/23/2023 CLINICAL DATA:  Abdominal pain, acute, nonlocalized EXAM: CT ABDOMEN AND PELVIS WITH CONTRAST TECHNIQUE: Multidetector CT imaging of the abdomen and pelvis was performed using the standard protocol following bolus administration of intravenous contrast. RADIATION DOSE REDUCTION: This exam was performed according to the departmental dose-optimization program which includes automated exposure control, adjustment of the mA and/or kV according to patient size and/or use of iterative reconstruction technique. CONTRAST:  OMNIPAQUE IOHEXOL 300 MG/ML  SOLN COMPARISON:   07/07/2021 FINDINGS: Lower chest: No acute abnormality Hepatobiliary: Diffuse low-density throughout the liver compatible with fatty infiltration. No focal abnormality. Gallbladder unremarkable. Pancreas: No focal abnormality or ductal dilatation. Spleen: No focal abnormality.  Normal size. Adrenals/Urinary Tract: Adrenal glands are normal. Small bilateral renal cysts, the largest 1.8 cm in the midpole of the left kidney, unchanged. No follow-up imaging recommended. No stones or hydronephrosis. Urinary bladder obscured by beam hardening artifact from bilateral hip replacements. Stomach/Bowel: Stomach, large and small bowel grossly unremarkable. Vascular/Lymphatic: Aortic atherosclerosis. No evidence of aneurysm or adenopathy. Reproductive: Obscured by beam hardening artifact from  bilateral hip replacements. Other: No free fluid or free air. Musculoskeletal: Bilateral hip replacements. No acute bony abnormality. IMPRESSION: Hepatic steatosis. No acute findings. Aortic atherosclerosis. Electronically Signed   By: Charlett Nose M.D.   On: 07/23/2023 18:28   CT Head Wo Contrast  Result Date: 07/23/2023 CLINICAL DATA:  Head trauma, abnormal mental status (Age 75-64y). Dizziness, fall. EXAM: CT HEAD WITHOUT CONTRAST TECHNIQUE: Contiguous axial images were obtained from the base of the skull through the vertex without intravenous contrast. RADIATION DOSE REDUCTION: This exam was performed according to the departmental dose-optimization program which includes automated exposure control, adjustment of the mA and/or kV according to patient size and/or use of iterative reconstruction technique. COMPARISON:  09/26/2022 FINDINGS: Brain: No acute intracranial abnormality. Specifically, no hemorrhage, hydrocephalus, mass lesion, acute infarction, or significant intracranial injury. Vascular: No hyperdense vessel or unexpected calcification. Skull: No acute calvarial abnormality. Sinuses/Orbits: No acute findings Other: None  IMPRESSION: No acute intracranial abnormality. Electronically Signed   By: Charlett Nose M.D.   On: 07/23/2023 18:25   DG Pelvis Portable  Result Date: 07/23/2023 CLINICAL DATA:  Shortness of breath.  Occasional falls. EXAM: PORTABLE PELVIS 2 VIEWS COMPARISON:  None Available. FINDINGS: Bilateral hip arthroplasty identified with screw fixated acetabular cups and Press-Fit femoral components. Mild degenerative changes of the right sacroiliac joint. No acute fracture or dislocation. 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 Electronically Signed   By: Karen Kays M.D.   On: 07/23/2023 17:16   DG Chest Port 1 View  Result Date: 07/23/2023 CLINICAL DATA:  Shortness of breath EXAM: PORTABLE CHEST 1 VIEW COMPARISON:  04/12/2022 FINDINGS: Underinflation. No consolidation, pneumothorax or effusion. No edema. Normal cardiopericardial silhouette. Overlapping cardiac leads. Truncated appearance of the distal right clavicle. Chronic. IMPRESSION: Underinflation.  No acute cardiopulmonary disease. Electronically Signed   By: Karen Kays M.D.   On: 07/23/2023 17:15    Procedures Procedures  {Document cardiac monitor, telemetry assessment procedure when appropriate:1}  Medications Ordered in ED Medications  sodium chloride 0.9 % bolus 1,000 mL (has no administration in time range)  potassium chloride 10 mEq in 100 mL IVPB (has no administration in time range)  sodium chloride 0.9 % bolus 1,000 mL (1,000 mLs Intravenous New Bag/Given 07/23/23 1829)  potassium chloride SA (KLOR-CON M) CR tablet 40 mEq (40 mEq Oral Given 07/23/23 1826)  potassium chloride 10 mEq in 100 mL IVPB (10 mEq Intravenous New Bag/Given 07/23/23 1829)  iohexol (OMNIPAQUE) 300 MG/ML solution 100 mL (100 mLs Intravenous Contrast Given 07/23/23 1800)    ED Course/ Medical Decision Making/ A&P  CRITICAL CARE Performed by: Bethann Berkshire Total critical care time: 40 minutes Critical care time was  exclusive of separately billable procedures and treating other patients. Critical care was necessary to treat or prevent imminent or life-threatening deterioration. Critical care was time spent personally by me on the following activities: development of treatment plan with patient and/or surrogate as well as nursing, discussions with consultants, evaluation of patient's response to treatment, examination of patient, obtaining history from patient or surrogate, ordering and performing treatments and interventions, ordering and review of laboratory studies, ordering and review of radiographic studies, pulse oximetry and re-evaluation of patient's condition.  {   Click here for ABCD2, HEART and other calculatorsREFRESH Note before signing :1}                              Medical Decision Making  Amount and/or Complexity of Data Reviewed Labs: ordered. Radiology: ordered. ECG/medicine tests: ordered.  Risk Prescription drug management. Decision regarding hospitalization.   Patient with hypokalemia dizziness with dehydration elevated.  He will be admitted to medicine  {Document critical care time when appropriate:1} {Document review of labs and clinical decision tools ie heart score, Chads2Vasc2 etc:1}  {Document your independent review of radiology images, and any outside records:1} {Document your discussion with family members, caretakers, and with consultants:1} {Document social determinants of health affecting pt's care:1} {Document your decision making why or why not admission, treatments were needed:1} Final Clinical Impression(s) / ED Diagnoses Final diagnoses:  Dizziness  Hypokalemia    Rx / DC Orders ED Discharge Orders     None

## 2023-07-23 NOTE — H&P (Signed)
History and Physical    Philip Humphrey WUJ:[id number] DOB: Oct 02, 1961 DOA: 07/23/2023  PCP: Courtney Paris, NP   Patient coming from: Home   Chief Complaint:  Chief Complaint  Patient presents with   Dizziness    HPI:  Philip Humphrey is a 62 y.o. male with medical history significant of significant for essential hypertension, hyperlipidemia, hypothyroidism, chronic alcohol use, peripheral neuropathy, hereditray hemochromatosis, vitamin B12 deficiency, chronic thrombocytopenia, GERD, generalized anxiety disorder, depression and arthritis, presented to emergency department with complaining of dizziness and recurrent fall at home.  Patient reported he has history of chronic dizziness for last couple of months however for last couple of days dizziness has been getting worse.  When he tries to get out of the bed he feels dizzy and trying to ambulate with his walker also feel imbalance at well.  He is also has recurrent fall due to dizziness.  Recently he fell and hit his head against a wall 4 days ago.  Denies any loss of consciousness.  Denies any headache, blurry vision, chest pain, palpitation, vomiting, diarrhea, change of appetite and change of weight.  Patient reported occasional dark stool for last 4 to 6 months.  Denies any noticing frank blood per rectum, black tarry stool.  Is complaining about nausea and poor appetite at home.  Denies any vomiting and abdominal pain. Had lost follow-up with outpatient PCP and oncology for last 8 to 10 months months.  Per chart review last oncology visit on 07/2022 found out patient has stable hereditary hemochromatosis not requiring therapeutic phlebotomy at that time.  ED Course:  Initial presentation to ED heart rate 106, respiratory rate 16 blood pressure 109/79 O2 sat 100% room air. Blood glucose 138 normal range.  Chloride 86, blood glucose 134, BMP showed sodium 132, low potassium of 2.5, creatinine 0.91, GFR above 60 and anion gap elevated  22. CBC showed elevated WBC count 17, RBC 2.27, hemoglobin 8.7 (baseline around 12), low hematocrit 25, platelet 123.  Elevated absolute neutrophil count 14.6. Hepatic panel showed elevated chronically elevated AST 54, ALP 154, AST 27, elevated bilirubin 3.4 and indirect bilirubin 2.3.  Imaging, chest x-ray underinflation.  No acute cardiopulmonary process  X-ray pelvis bilateral hip arthroplasty and degenerative change of the right sacroiliac joint.  CT head no acute abnormality.  CT ab pelvis showed hepatic asteatosis no acute finding and aortic atherosclerosis.  As patient found hypotensive in the ED blood pressure borderline soft however maintaining MAP above 65 received NS 2 L bolus.  Hospitalist team has been consulted to admit patient for further workup for dizziness.  Review of Systems:  Review of Systems  Constitutional:  Positive for malaise/fatigue. Negative for chills, fever and weight loss.  HENT:  Negative for congestion, ear discharge, ear pain, hearing loss, sinus pain and tinnitus.   Eyes:  Negative for blurred vision, double vision, pain and discharge.  Respiratory:  Negative for cough, sputum production and shortness of breath.   Cardiovascular:  Negative for chest pain, palpitations, orthopnea and leg swelling.  Gastrointestinal:  Positive for nausea. Negative for abdominal pain, blood in stool, constipation, diarrhea, heartburn, melena and vomiting.  Genitourinary:  Negative for dysuria, frequency and urgency.  Musculoskeletal:  Negative for back pain, myalgias and neck pain.  Neurological:  Positive for dizziness. Negative for tingling, tremors, sensory change, focal weakness, seizures, loss of consciousness, weakness and headaches.  Psychiatric/Behavioral:  The patient is not nervous/anxious.     Past Medical History:  Diagnosis Date  Allergy    occ. seasonal   Anxiety    Arthritis    Colon polyp    Condyloma    anal   Depression    GERD  (gastroesophageal reflux disease)    History of transfusion    as child   Hypertension    Knee injury    right knee cap w piece broken off- MRI done 02-06-15-pending surgery. 11-18-16 right knee is still painful, both shoulders(limited ROM right).   Knee pain    Neuropathy    bilateral- greater left   Palpitations    normal stress test 10 yrs ago   Pneumonia    Thyroid disease    Thyroid removed unintentionally at age 51- no further thyroid tissue remains-uses daily supplement    Past Surgical History:  Procedure Laterality Date   COLONOSCOPY     COLONOSCOPY N/A 02/15/2015   Procedure: COLONOSCOPY;  Surgeon: Ovidio Kin, MD;  Location: [facility];  Service: General;  Laterality: N/A;   cyst removed     from neck / abdomed   EXAMINATION UNDER ANESTHESIA N/A 02/15/2015   Procedure: EXAM UNDER ANESTHESIA;  Surgeon: Valarie Merino, MD;  Location: [facility];  Service: General;  Laterality: N/A;   HERNIA REPAIR Left    10 yrs ago   KNEE ARTHROSCOPY Left    x1 and meniscal tear    ROTATOR CUFF REPAIR Right    x1   SHOULDER ARTHROSCOPY W/ ROTATOR CUFF REPAIR Left    THYROIDECTOMY  age 84    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 Charlann Boxer 08/03/17   TOTAL HIP ARTHROPLASTY Right 08/03/2017   Procedure: RIGHT TOTAL HIP ARTHROPLASTY ANTERIOR APPROACH;  Surgeon: [name], MD;  Location: [facility];  Service: Orthopedics;  Laterality: Right;  70 mins   UMBILICAL HERNIA REPAIR N/A 10/18/2017   Procedure: Umbilical hernia repair  and excision of lipomas;  Surgeon: Luretha Murphy, MD;  Location: [facility];  Service: General;  Laterality: N/A;   WART FULGURATION N/A 02/15/2015   Procedure: REMOVAL OF ANAL TAGS,CONDYLOMA;  Surgeon: Valarie Merino, MD;  Location: [facility];  Service: General;  Laterality: N/A;   WRIST  SURGERY     x2- left (repair tendon/ ligament)     reports that he has quit smoking. His smoking use included cigars. He has never used smokeless tobacco. He reports current alcohol use. He reports that he does not use drugs.  Allergies  Allergen Reactions   Sulfa Antibiotics Anaphylaxis, Swelling and Other (See Comments)    Mouth and throat swelling   Oxycodone Itching   Nickel Rash and Other (See Comments)    Caused cold sores    Family History  Problem Relation Age of Onset   Heart disease Mother    Cancer Brother        Lung   Lung disease Brother    Stroke Father     Prior to Admission medications   Medication Sig Start Date End Date Taking? Authorizing Provider  atorvastatin (LIPITOR) 80 MG tablet Take 80 mg by mouth daily. 10/09/16   [provider]  bismuth subsalicylate (PEPTO BISMOL) 262 MG/15ML suspension Take 30 mLs by mouth every 6 (six) hours as needed for indigestion (stomach pain).    [provider]  calcium carbonate (TUMS -  DOSED IN MG ELEMENTAL CALCIUM) 500 MG chewable tablet Chew 2 tablets by mouth at bedtime as needed for indigestion or heartburn.    [provider]  Cyanocobalamin (B-12) 1000 MCG SUBL Place 1,000 mcg under the tongue daily. 07/26/22   Briant Cedar, PA-C  diclofenac Sodium (VOLTAREN) 1 % GEL Apply 1 application topically 2 (two) times daily as needed (pain).    [provider]  diphenhydramine-acetaminophen (TYLENOL PM) 25-500 MG TABS tablet Take 1 tablet by mouth at bedtime as needed (sleep).    [provider]  famotidine (PEPCID) 40 MG tablet Take 40 mg by mouth daily as needed for heartburn or indigestion. 01/25/21   [provider]  folic acid (FOLVITE) 1 MG tablet Take 1 tablet (1 mg total) by mouth daily. 04/14/22   Leroy Sea, MD  gabapentin (NEURONTIN) 300 MG capsule Take 300-600 mg by mouth 2 (two) times daily. Take 1 capsule (300 mg) in the morning and Take 2 capsules (600  mg) at bedtime    [provider]  hydrOXYzine (ATARAX/VISTARIL) 25 MG tablet Take 25 mg by mouth every 4 (four) hours as needed for anxiety. For itching 06/17/21   [provider]  lisinopril (ZESTRIL) 20 MG tablet Take 20 mg by mouth daily. 08/02/22   [provider]  Magnesium 400 MG TABS Take 400 mg by mouth daily.    [provider]  MELATONIN PO Take 1 tablet by mouth at bedtime as needed (sleep).    [provider]  midodrine (PROAMATINE) 2.5 MG tablet Take 5 mg by mouth 3 (three) times daily. 09/03/22   [provider]  nitroGLYCERIN (NITROSTAT) 0.4 MG SL tablet Place 0.4 mg under the tongue every 5 (five) minutes as needed for chest pain.    [provider]  olmesartan (BENICAR) 20 MG tablet Take 20 mg by mouth daily. 08/14/22   [provider]  sertraline (ZOLOFT) 50 MG tablet Take 50 mg by mouth at bedtime.    [provider]  SYNTHROID 175 MCG tablet Take 1 tablet (175 mcg total) by mouth daily. NO MORE REFILLS WITHOUT OFFICE VISIT - 2ND NOTICE Patient taking differently: Take 175 mcg by mouth daily. 11/14/14   Tonye Pearson, MD  thiamine 100 MG tablet Take 1 tablet (100 mg total) by mouth daily. Patient not taking: Reported on 09/14/2022 04/14/22   Leroy Sea, MD  traZODone (DESYREL) 50 MG tablet Take 1 tablet (50 mg total) by mouth at bedtime. 09/15/22   Bing Neighbors, NP  Vitamin D, Ergocalciferol, (DRISDOL) 1.25 MG (50000 UNIT) CAPS capsule Take 50,000 Units by mouth every 7 (seven) days. 05/14/21   [provider]     Physical Exam: Vitals:   07/23/23 1557 07/23/23 1601 07/23/23 1605 07/23/23 2000  BP:  109/79  108/74  Pulse:  (!) 106  98  Resp:  16  16  Temp:  98.3 F (36.8 C)  98.3 F (36.8 C)  TempSrc:  Oral    SpO2:  100%  97%  Weight: 87 kg  86.2 kg   Height: 5\' 10"  (1.778 m)  5' 9.5" (1.765 m)     Physical Exam Constitutional:      Appearance: He is  ill-appearing.  HENT:     Mouth/Throat:     Mouth: Mucous membranes are moist.  Eyes:     Pupils: Pupils are equal, round, and reactive to light.  Cardiovascular:     Rate and Rhythm: Tachycardia  present.     Pulses: Normal pulses.     Heart sounds: Normal heart sounds.  Pulmonary:     Effort: Pulmonary effort is normal.     Breath sounds: Normal breath sounds.  Abdominal:     General: Bowel sounds are normal. There is no distension.     Tenderness: There is no abdominal tenderness. There is no guarding or rebound.  Musculoskeletal:     Cervical back: Neck supple.     Right lower leg: No edema.     Left lower leg: No edema.  Skin:    General: Skin is warm.  Neurological:     Mental Status: He is alert and oriented to person, place, and time.     Cranial Nerves: No cranial nerve deficit.     Sensory: No sensory deficit.     Motor: No weakness.     Coordination: Coordination normal.  Psychiatric:        Mood and Affect: Mood normal.        Thought Content: Thought content normal.      Labs on Admission: I have personally reviewed following labs and imaging studies  CBC: Recent Labs  Lab 07/23/23 1635  WBC 17.4*  NEUTROABS 14.6*  HGB 8.7*  HCT 25.1*  MCV 110.6*  PLT 123*   Basic Metabolic Panel: Recent Labs  Lab 07/23/23 1635 07/23/23 2005  NA 132*  --   K 2.5*  --   CL 86*  --   CO2 24  --   GLUCOSE 134*  --   BUN 11  --   CREATININE 0.91  --   CALCIUM 9.0  --   MG  --  1.6*  PHOS  --  1.8*   GFR: Estimated Creatinine Clearance: 84.5 mL/min (by C-G formula based on SCr of 0.91 mg/dL). Liver Function Tests: Recent Labs  Lab 07/23/23 1635  AST 54*  ALT 27  ALKPHOS 154*  BILITOT 3.4*  PROT 7.0  ALBUMIN 3.8   No results for input(s): "LIPASE", "AMYLASE" in the last 168 hours. No results for input(s): "AMMONIA" in the last 168 hours. Coagulation Profile: No results for input(s): "INR", "PROTIME" in the last 168 hours. Cardiac Enzymes: No  results for input(s): "CKTOTAL", "CKMB", "CKMBINDEX", "TROPONINI", "TROPONINIHS" in the last 168 hours. BNP (last 3 results) No results for input(s): "BNP" in the last 8760 hours. HbA1C: No results for input(s): "HGBA1C" in the last 72 hours. CBG: Recent Labs  Lab 07/23/23 1612  GLUCAP 138*   Lipid Profile: No results for input(s): "CHOL", "HDL", "LDLCALC", "TRIG", "CHOLHDL", "LDLDIRECT" in the last 72 hours. Thyroid Function Tests: Recent Labs    07/23/23 2005  TSH 13.977*   Anemia Panel: Recent Labs    07/23/23 2005  VITAMINB12 319  FOLATE 2.3*  FERRITIN 372*  TIBC 199*  IRON 179   Urine analysis:    Component Value Date/Time   COLORURINE AMBER (A) 09/26/2022 1839   APPEARANCEUR CLOUDY (A) 09/26/2022 1839   LABSPEC 1.019 09/26/2022 1839   PHURINE 5.0 09/26/2022 1839   GLUCOSEU NEGATIVE 09/26/2022 1839   HGBUR NEGATIVE 09/26/2022 1839   BILIRUBINUR NEGATIVE 09/26/2022 1839   KETONESUR 20 (A) 09/26/2022 1839   PROTEINUR 100 (A) 09/26/2022 1839   NITRITE NEGATIVE 09/26/2022 1839   LEUKOCYTESUR NEGATIVE 09/26/2022 1839    Radiological Exams on Admission: I have personally reviewed images CT ABDOMEN PELVIS W CONTRAST  Result Date: 07/23/2023 CLINICAL DATA:  Abdominal pain, acute, nonlocalized EXAM: CT ABDOMEN AND PELVIS  WITH CONTRAST TECHNIQUE: Multidetector CT imaging of the abdomen and pelvis was performed using the standard protocol following bolus administration of intravenous contrast. RADIATION DOSE REDUCTION: This exam was performed according to the departmental dose-optimization program which includes automated exposure control, adjustment of the mA and/or kV according to patient size and/or use of iterative reconstruction technique. CONTRAST:  OMNIPAQUE IOHEXOL 300 MG/ML  SOLN COMPARISON:  07/07/2021 FINDINGS: Lower chest: No acute abnormality Hepatobiliary: Diffuse low-density throughout the liver compatible with fatty infiltration. No focal abnormality.  Gallbladder unremarkable. Pancreas: No focal abnormality or ductal dilatation. Spleen: No focal abnormality.  Normal size. Adrenals/Urinary Tract: Adrenal glands are normal. Small bilateral renal cysts, the largest 1.8 cm in the midpole of the left kidney, unchanged. No follow-up imaging recommended. No stones or hydronephrosis. Urinary bladder obscured by beam hardening artifact from bilateral hip replacements. Stomach/Bowel: Stomach, large and small bowel grossly unremarkable. Vascular/Lymphatic: Aortic atherosclerosis. No evidence of aneurysm or adenopathy. Reproductive: Obscured by beam hardening artifact from bilateral hip replacements. Other: No free fluid or free air. Musculoskeletal: Bilateral hip replacements. No acute bony abnormality. IMPRESSION: Hepatic steatosis. No acute findings. Aortic atherosclerosis. Electronically Signed   By: Charlett Nose M.D.   On: 07/23/2023 18:28   CT Head Wo Contrast  Result Date: 07/23/2023 CLINICAL DATA:  Head trauma, abnormal mental status (Age 45-64y). Dizziness, fall. EXAM: CT HEAD WITHOUT CONTRAST TECHNIQUE: Contiguous axial images were obtained from the base of the skull through the vertex without intravenous contrast. RADIATION DOSE REDUCTION: This exam was performed according to the departmental dose-optimization program which includes automated exposure control, adjustment of the mA and/or kV according to patient size and/or use of iterative reconstruction technique. COMPARISON:  09/26/2022 FINDINGS: Brain: No acute intracranial abnormality. Specifically, no hemorrhage, hydrocephalus, mass lesion, acute infarction, or significant intracranial injury. Vascular: No hyperdense vessel or unexpected calcification. Skull: No acute calvarial abnormality. Sinuses/Orbits: No acute findings Other: None IMPRESSION: No acute intracranial abnormality. Electronically Signed   By: Charlett Nose M.D.   On: 07/23/2023 18:25   DG Pelvis Portable  Result Date: 07/23/2023 CLINICAL  DATA:  Shortness of breath.  Occasional falls. EXAM: PORTABLE PELVIS 2 VIEWS COMPARISON:  None Available. FINDINGS: Bilateral hip arthroplasty identified with screw fixated acetabular cups and Press-Fit femoral components. Mild degenerative changes of the right sacroiliac joint. No acute fracture or dislocation. 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 Electronically Signed   By: Karen Kays M.D.   On: 07/23/2023 17:16   DG Chest Port 1 View  Result Date: 07/23/2023 CLINICAL DATA:  Shortness of breath EXAM: PORTABLE CHEST 1 VIEW COMPARISON:  04/12/2022 FINDINGS: Underinflation. No consolidation, pneumothorax or effusion. No edema. Normal cardiopericardial silhouette. Overlapping cardiac leads. Truncated appearance of the distal right clavicle. Chronic. IMPRESSION: Underinflation.  No acute cardiopulmonary disease. Electronically Signed   By: Karen Kays M.D.   On: 07/23/2023 17:15    EKG: My personal interpretation of EKG shows: Sinus tachycardia heart rate 107.  No ST and T wave abnormality    Assessment/Plan: Principal Problem:   Postural dizziness with near syncope Active Problems:   Hypokalemia   Dizziness   Acute on chronic anemia   Hypothyroidism   Essential hypertension   Hereditary hemochromatosis (HCC)   Chronic alcohol use   Hyperlipidemia   Peripheral neuropathy   Leukocytosis   Chronic idiopathic thrombocytopenia (HCC)   Hepatic steatosis   Vitamin B12 deficiency   GERD (gastroesophageal reflux disease)   Hypotensive episode   Hypophosphatemia  Hypomagnesemia    Assessment and Plan: Postural dizziness with near syncope -Patient coming with complaining of chronic dizziness for last few months however for last couple of days it has been progressively getting worse that he  is unable to feel steady while walking and getting out of the bed.  Patient reported 1 fall last week.  Denies any loss of consciousness.  He has  also associated nausea. -Workup revealed several electrolyte derangement including hyponatremia, hypokalemia, hypophosphatemia and hypomagnesemia. -Patient found hypotensive in the emergency department as well. - CT head unremarkable. -Dizziness could be peripheral vertigo contributing to several electrolyte derangement, hypotension and polypharmacy -EKG showed sinus tachycardia heart rate 107. - Checking echocardiogram.  On discharge patient need Holter monitor to make sure patient does not have any arrhythmia. -Checking orthostatic vital. -Continue fall precaution - Holding home blood pressure regimen in the setting of hypotension. - Consulted with physical therapy for evaluation, - Consulted physical therapy for vestibular balance assessment.   Hyponatremia -Serum sodium 132 initial presentation - In the ED patient received 2 L of normal NS bolus. -Continue NS 50 cc/h for 10 hours. - Checking serum sodium and potassium level 3 times daily for next 1 day.  Hypokalemia -Low serum potassium 2.5 initial presentation - In the ED patient received KCl IV 10 mwq and oral KCl 40 mEq.-Replating with KCl IV 10 mEq X 4=40 -No EKG change with hypokalemia. - Continue to monitor electrolyte closely and replete accordingly  Hypomagnesemia -Low magnesium level 1.8. - Repleted with IV mag sulfate 2 g once  Hypophosphatemia -Low Phos 1.8.  Repleted with Phos-Nak 3 times daily for 1 day. -Continue to trend electrolyte and replete as needed  Hypothyroidism -Resume home levothyroxine 175 mcg daily. - Checking TSH level  Essential hypertension Currently hypotensive -Patient reported low blood pressure at home. - Home blood pressure regimen include zestril 20 mg daily and Benicar 20 mg daily.  Holding oral blood pressure regimen in the setting of borderline low blood pressure and dizziness. -Continue to check blood pressure and will start accordingly.  Hereditary hemochromatosis -History of  hemochromatosis follow-up with oncology outpatient lost follow-up after 2023. - Lab check today showed low H&H 8.7 and 25.1.  Patient's baseline hemoglobin around 11-14.  Patient denies any active GI bleed however he is reporting black stool on and off for last 4 to 6 months. - Checking stool occult blood test - Trending hemoglobin 3 times daily for 1 day - Checking iron, TIBC, ferritin, folate and B12 level. -Patient reported lost follow-up with oncology outpatient. -Please reach out and consult on-call oncologist in the a.m.    Leukocytosis - Elevated WBC count 17.  Normal lactic acid 1.6. -Chest x-ra no acute cardiopulmonary process - CT abdomen pelvis no acute intra-abdominal finding. -Patient is afebrile.  No concern for infection at this time.  Unclear cause of leukocytosis. - Continue to monitor for development of any fever and chills.  And will trend WBC count.   Chronic idiopathic thrombocytopenia -Platelet count 123.  Baseline platelet is around 102 to 127.  Continue to monitor.  Hepatic steatosis Hyperbilirubinemia Chronic transaminitis Elevated ALP -CT abdomen pelvis showed hepatic asteatosis.  Patient has history of hemochromatosis which affect liver function as well. - Elevated AST 54, ALT, 27, alkaline phosphatase 154, bilirubin 3.4, indirect bilirubin 2.3 and direct bilirubin 1.1.  Given patient has elevated indirect bilirubin and low hemoglobin checking LDH. -Please reach out to on-call GI in the morning for consult.  History of chronic alcohol  use -Patient declines using of alcohol recently.  Still checking blood alcohol level and UDS.  Peripheral neuropathy -At home patient takes Neurontin 300 mg in the morning and 600 mg at bedtime.  Holding gabapentin in the setting of dizziness and recurrent fall.  Generalized anxiety disorder - Resumed home Atarax 25 mg 4 times daily as needed.  Hyperlipidemia - Holding Lipitor in the setting of transaminitis.  DVT  prophylaxis:  SCDs Code Status:  Full Code Diet: Heart healthy diet. Family Communication: Discussed treatment plan with patient Disposition Plan: Pending disposition.  Possible discharge to home next 2 to 3 days.  Consults: Oncology and gastroenterology Admission status:   Inpatient, Telemetry bed  Severity of Illness: The appropriate patient status for this patient is INPATIENT. Inpatient status is judged to be reasonable and necessary in order to provide the required intensity of service to ensure the patient's safety. The patient's presenting symptoms, physical exam findings, and initial radiographic and laboratory data in the context of their chronic comorbidities is felt to place them at high risk for further clinical deterioration. Furthermore, it is not anticipated that the patient will be medically stable for discharge from the hospital within 2 midnights of admission.   * I certify that at the point of admission it is my clinical judgment that the patient will require inpatient hospital care spanning beyond 2 midnights from the point of admission due to high intensity of service, high risk for further deterioration and high frequency of surveillance required.Marland Kitchen    Tereasa Coop, MD [facility]  How to contact the [facility] Attending or Consulting provider 7A - 7P or covering provider during after hours 7P -7A, for this patient.  Check the care team in [facility] and look for a) attending/consulting [facility] provider listed and b) the [facility] team listed Log into [email or web address] and use [facility]'s universal password to access. If you do not have the password, please contact the hospital operator. Locate the [facility] provider you are looking for under [facility] and page to a number that you can be directly reached. If you still have difficulty reaching the provider, please page the [facility] (Director on Call) for the Hospitalists listed on amion for assistance.  07/23/2023, 11:27 PM

## 2023-07-23 NOTE — ED Notes (Signed)
IV pending

## 2023-07-24 ENCOUNTER — Inpatient Hospital Stay (HOSPITAL_COMMUNITY): Payer: Medicare Other

## 2023-07-24 DIAGNOSIS — R55 Syncope and collapse: Secondary | ICD-10-CM | POA: Diagnosis not present

## 2023-07-24 DIAGNOSIS — R42 Dizziness and giddiness: Secondary | ICD-10-CM

## 2023-07-24 LAB — CBC
HCT: 19.7 % — ABNORMAL LOW (ref 39.0–52.0)
Hemoglobin: 6.6 g/dL — CL (ref 13.0–17.0)
MCH: 37.3 pg — ABNORMAL HIGH (ref 26.0–34.0)
MCHC: 33.5 g/dL (ref 30.0–36.0)
MCV: 111.3 fL — ABNORMAL HIGH (ref 80.0–100.0)
Platelets: 93 10*3/uL — ABNORMAL LOW (ref 150–400)
RBC: 1.77 MIL/uL — ABNORMAL LOW (ref 4.22–5.81)
RDW: 17.3 % — ABNORMAL HIGH (ref 11.5–15.5)
WBC: 13.1 10*3/uL — ABNORMAL HIGH (ref 4.0–10.5)
nRBC: 0.2 % (ref 0.0–0.2)

## 2023-07-24 LAB — BASIC METABOLIC PANEL
Anion gap: 10 (ref 5–15)
BUN: 9 mg/dL (ref 8–23)
CO2: 26 mmol/L (ref 22–32)
Calcium: 7.5 mg/dL — ABNORMAL LOW (ref 8.9–10.3)
Chloride: 95 mmol/L — ABNORMAL LOW (ref 98–111)
Creatinine, Ser: 0.63 mg/dL (ref 0.61–1.24)
GFR, Estimated: >60 mL/min (ref >60)
Glucose, Bld: 120 mg/dL — ABNORMAL HIGH (ref 70–99)
Potassium: 2.9 mmol/L — ABNORMAL LOW (ref 3.5–5.1)
Sodium: 131 mmol/L — ABNORMAL LOW (ref 135–145)

## 2023-07-24 LAB — PREPARE RBC (CROSSMATCH)

## 2023-07-24 LAB — LACTIC ACID, PLASMA: Lactic Acid, Venous: 1.2 mmol/L (ref 0.5–1.9)

## 2023-07-24 MED ORDER — SODIUM CHLORIDE 0.9% IV SOLUTION: Freq: Once | INTRAVENOUS | Status: DC

## 2023-07-24 MED ORDER — POTASSIUM CHLORIDE 20 MEQ PO PACK: 40.0000 meq | PACK | Freq: Three times a day (TID) | ORAL | Status: DC

## 2023-07-24 MED ORDER — MIDODRINE HCL 5 MG PO TABS
5.0000 mg | ORAL_TABLET | Freq: Three times a day (TID) | ORAL | Status: DC
  Administered 2023-07-24 – 2023-07-26 (×8): 5 mg via ORAL
  Filled 2023-07-24 (×8): qty 1

## 2023-07-24 MED ORDER — POTASSIUM PHOSPHATES 15 MMOLE/5ML IV SOLN
30.0000 mmol | Freq: Once | INTRAVENOUS | Status: AC
  Administered 2023-07-24: 30 mmol via INTRAVENOUS
  Filled 2023-07-24: qty 10

## 2023-07-24 MED ORDER — MIDODRINE HCL 5 MG PO TABS: 5.0000 mg | ORAL_TABLET | Freq: Three times a day (TID) | ORAL | Status: DC

## 2023-07-24 NOTE — Consult Note (Signed)
[facility] Consultation Note  Referring Provider: [facility] Primary Care Physician:  Courtney Paris, NP Primary Gastroenterologist:  Gentry Fitz  Reason for Consultation:  anemia, syncope  HPI: Philip Humphrey is a 62 y.o. male admitted dizziness with syncope..  Found to have Hgb 6.6.  MCV 111.  No hematemesis, blood in stool (melena or hematochezia), change in bowel habits.  Has had some nausea.  Possible weight loss.  Reports prior endoscopy (can't find report) and colonoscopy (2016, diverticulosis otherwise unrevealing, by Dr. Ovidio Kin).  CT atherosclerosis, hepatic steatosis, no other acute findings.   Past Medical History:  Diagnosis Date   Allergy    occ. seasonal   Anxiety    Arthritis    Colon polyp    Condyloma    anal   Depression    GERD (gastroesophageal reflux disease)    History of transfusion    as child   Hypertension    Knee injury    right knee cap w piece broken off- MRI done 02-06-15-pending surgery. 11-18-16 right knee is still painful, both shoulders(limited ROM right).   Knee pain    Neuropathy    bilateral- greater left   Palpitations    normal stress test 10 yrs ago   Pneumonia    Thyroid disease    Thyroid removed unintentionally at age 23- no further thyroid tissue remains-uses daily supplement    Past Surgical History:  Procedure Laterality Date   COLONOSCOPY     COLONOSCOPY N/A 02/15/2015   Procedure: COLONOSCOPY;  Surgeon: Ovidio Kin, MD;  Location: [facility];  Service: General;  Laterality: N/A;   cyst removed     from neck / abdomed   EXAMINATION UNDER ANESTHESIA N/A 02/15/2015   Procedure: EXAM UNDER ANESTHESIA;  Surgeon: Valarie Merino, MD;  Location: [facility];  Service: General;  Laterality: N/A;   HERNIA REPAIR Left    10 yrs ago   KNEE ARTHROSCOPY Left    x1 and meniscal tear    ROTATOR CUFF REPAIR Right    x1   SHOULDER ARTHROSCOPY W/ ROTATOR CUFF REPAIR Left    THYROIDECTOMY  age 2    TOTAL HIP  ARTHROPLASTY Left 11/24/2016   Procedure: LEFT TOTAL HIP ARTHROPLASTY ANTERIOR APPROACH;  Surgeon: [name], MD;  Location: [facility];  Service: Orthopedics;  Laterality: Left;   TOTAL HIP ARTHROPLASTY     Right hip  Dr. Charlann Boxer 08/03/17   TOTAL HIP ARTHROPLASTY Right 08/03/2017   Procedure: RIGHT TOTAL HIP ARTHROPLASTY ANTERIOR APPROACH;  Surgeon: [name], MD;  Location: [facility];  Service: Orthopedics;  Laterality: Right;  70 mins   UMBILICAL HERNIA REPAIR N/A 10/18/2017   Procedure: Umbilical hernia repair  and excision of lipomas;  Surgeon: Luretha Murphy, MD;  Location: [facility];  Service: General;  Laterality: N/A;   WART FULGURATION N/A 02/15/2015   Procedure: REMOVAL OF ANAL TAGS,CONDYLOMA;  Surgeon: Valarie Merino, MD;  Location: [facility];  Service: General;  Laterality: N/A;   WRIST SURGERY     x2- left (repair tendon/ ligament)    Prior to Admission medications   Medication Sig Start Date End Date Taking? Authorizing Provider  diphenhydramine-acetaminophen (TYLENOL PM) 25-500 MG TABS tablet Take 2-3 tablets by mouth at bedtime as needed (for sleep).   Yes [provider]  SYNTHROID 175 MCG tablet Take 1 tablet (175 mcg total) by mouth daily. NO MORE REFILLS WITHOUT OFFICE VISIT - 2ND NOTICE 11/14/14  Yes Tonye Pearson, MD  atorvastatin (LIPITOR)  80 MG tablet Take 80 mg by mouth daily. Patient not taking: Reported on 07/23/2023 10/09/16   [provider]  bismuth subsalicylate (PEPTO BISMOL) 262 MG/15ML suspension Take 30 mLs by mouth every 6 (six) hours as needed for indigestion (stomach pain).    [provider]  calcium carbonate (TUMS - DOSED IN MG ELEMENTAL CALCIUM) 500 MG chewable tablet Chew 2 tablets by mouth at bedtime as needed for indigestion or heartburn.    [provider]  Cyanocobalamin (B-12) 1000 MCG SUBL Place 1,000 mcg under the tongue daily. 07/26/22   Briant Cedar, PA-C  diclofenac Sodium (VOLTAREN) 1 % GEL Apply 1  application topically 2 (two) times daily as needed (pain).    [provider]  famotidine (PEPCID) 40 MG tablet Take 40 mg by mouth daily as needed for heartburn or indigestion. Patient not taking: Reported on 07/23/2023 01/25/21   [provider]  folic acid (FOLVITE) 1 MG tablet Take 1 tablet (1 mg total) by mouth daily. Patient not taking: Reported on 07/23/2023 04/14/22   Leroy Sea, MD  gabapentin (NEURONTIN) 300 MG capsule Take 300-600 mg by mouth 2 (two) times daily. Take 1 capsule (300 mg) in the morning and Take 2 capsules (600 mg) at bedtime Patient not taking: Reported on 07/23/2023    [provider]  hydrOXYzine (ATARAX/VISTARIL) 25 MG tablet Take 25 mg by mouth every 4 (four) hours as needed for anxiety. For itching Patient not taking: Reported on 07/23/2023 06/17/21   [provider]  lisinopril (ZESTRIL) 20 MG tablet Take 20 mg by mouth daily. Patient not taking: Reported on 07/23/2023 08/02/22   [provider]  Magnesium 400 MG TABS Take 400 mg by mouth daily.    [provider]  MELATONIN PO Take 1 tablet by mouth at bedtime as needed (sleep).    [provider]  midodrine (PROAMATINE) 2.5 MG tablet Take 5 mg by mouth 3 (three) times daily. Patient not taking: Reported on 07/23/2023 09/03/22   [provider]  nitroGLYCERIN (NITROSTAT) 0.4 MG SL tablet Place 0.4 mg under the tongue every 5 (five) minutes as needed for chest pain. Patient not taking: Reported on 07/23/2023    [provider]  olmesartan (BENICAR) 20 MG tablet Take 20 mg by mouth daily. Patient not taking: Reported on 07/23/2023 08/14/22   [provider]  sertraline (ZOLOFT) 50 MG tablet Take 50 mg by mouth at bedtime. Patient not taking: Reported on 07/23/2023    [provider]  thiamine 100 MG tablet Take 1 tablet (100 mg total) by mouth daily. 04/14/22   Leroy Sea, MD  traZODone (DESYREL) 50 MG tablet Take 1 tablet (50  mg total) by mouth at bedtime. Patient not taking: Reported on 07/23/2023 09/15/22   Bing Neighbors, NP  Vitamin D, Ergocalciferol, (DRISDOL) 1.25 MG (50000 UNIT) CAPS capsule Take 50,000 Units by mouth every 7 (seven) days. Patient not taking: Reported on 07/23/2023 05/14/21   [provider]    Current Facility-Administered Medications  Medication Dose Route Frequency Provider Last Rate Last Admin   0.9 %  sodium chloride infusion (Manually program via Guardrails IV Fluids)   Intravenous Once Sundil, Subrina, MD       0.9 %  sodium chloride infusion  250 mL Intravenous PRN Janalyn Shy, Subrina, MD       acetaminophen (TYLENOL) tablet 650 mg  650 mg Oral Q6H PRN Janalyn Shy, Subrina, MD   650 mg at 07/24/23 [phone]  Or   acetaminophen (TYLENOL) suppository 650 mg  650 mg Rectal Q6H PRN Janalyn Shy, Subrina, MD       bismuth subsalicylate (PEPTO BISMOL) 262 MG/15ML suspension 30 mL  30 mL Oral Q6H PRN Janalyn Shy, Subrina, MD       calcium carbonate (TUMS - dosed in mg elemental calcium) chewable tablet 400 mg of elemental calcium  2 tablet Oral QHS PRN Janalyn Shy, Subrina, MD       famotidine (PEPCID) tablet 40 mg  40 mg Oral Daily PRN Janalyn Shy, Subrina, MD       folic acid (FOLVITE) tablet 1 mg  1 mg Oral Daily Sundil, Subrina, MD   1 mg at 07/24/23 0981   hydrOXYzine (ATARAX) tablet 25 mg  25 mg Oral Q4H PRN Janalyn Shy, Subrina, MD   25 mg at 07/24/23 1914   levothyroxine (SYNTHROID) tablet 175 mcg  175 mcg Oral Q0600 Tereasa Coop, MD   175 mcg at 07/24/23 0507   melatonin tablet 5 mg  5 mg Oral QHS PRN Janalyn Shy, Subrina, MD       midodrine (PROAMATINE) tablet 5 mg  5 mg Oral TID WC Sundil, Subrina, MD   5 mg at 07/24/23 0855   ondansetron (ZOFRAN) tablet 4 mg  4 mg Oral Q6H PRN Janalyn Shy, Subrina, MD       Or   ondansetron [facility]) injection 4 mg  4 mg Intravenous Q6H PRN Janalyn Shy, Subrina, MD       potassium & sodium phosphates (PHOS-NAK) 280-160-250 MG packet 1 packet  1 packet Oral TID WC & HS Sundil, Subrina, MD        potassium PHOSPHATE 30 mmol in dextrose 5 % 500 mL infusion  30 mmol Intravenous Once Alberteen Sam, MD 85 mL/hr at 07/24/23 0938 30 mmol at 07/24/23 7829   senna-docusate (Senokot-S) tablet 1 tablet  1 tablet Oral QHS PRN Janalyn Shy, Subrina, MD       sertraline (ZOLOFT) tablet 50 mg  50 mg Oral QHS Sundil, Subrina, MD   50 mg at 07/23/23 2329   sodium chloride flush (NS) 0.9 % injection 3 mL  3 mL Intravenous Q12H Sundil, Subrina, MD   3 mL at 07/24/23 0940   sodium chloride flush (NS) 0.9 % injection 3 mL  3 mL Intravenous PRN Sundil, Subrina, MD       vitamin B-12 (CYANOCOBALAMIN) tablet 1,000 mcg  1,000 mcg Oral Daily Sundil, Subrina, MD        Allergies as of 07/23/2023 - Review Complete 07/23/2023  Allergen Reaction Noted   Sulfa antibiotics Anaphylaxis, Swelling, and Other (See Comments) 04/08/2012   Oxycodone Itching 07/13/2013   Nickel Rash and Other (See Comments) 07/23/2023    Family History  Problem Relation Age of Onset   Heart disease Mother    Cancer Brother        Lung   Lung disease Brother    Stroke Father     Social History   Socioeconomic History   Marital status: Single    Spouse name: Not on file   Number of children: Not on file   Years of education: Not on file   Highest education level: Not on file  Occupational History   Not on file  Tobacco Use   Smoking status: Former    Types: Cigars   Smokeless tobacco: Never   Tobacco comments:    occasional quit 2012  Vaping Use   Vaping status: Never Used  Substance and Sexual Activity   Alcohol use:  Yes    Comment: drinking liquor each night recently   Drug use: No   Sexual activity: Yes  Other Topics Concern   Not on file  Social History Narrative   Not on file   Social Determinants of Health   Financial Resource Strain: Not on file  Food Insecurity: Not on file  Transportation Needs: Not on file  Physical Activity: Not on file  Stress: Not on file  Social Connections: Not on  file  Intimate Partner Violence: Not on file    Review of Systems: As per HPI, all others negative  Physical Exam: Vital signs in last 24 hours: Temp:  [97.5 F (36.4 C)-98.4 F (36.9 C)] 97.8 F (36.6 C) (08/03 1102) Pulse Rate:  [85-106] 85 (08/03 1349) Resp:  [16-18] 17 (08/03 1102) BP: (90-112)/(51-79) 112/64 (08/03 1349) SpO2:  [91 %-100 %] 97 % (08/03 1203) Weight:  [86.2 kg-87 kg] 86.2 kg (08/02 1605)   General:   Alert,  overweight, older-appearing than stated age, cooperative in NAD Head:  Normocephalic and atraumatic. Eyes:  Sclera clear, no icterus.   Conjunctiva pale Ears:  Normal auditory acuity. Nose:  No deformity, discharge,  or lesions. Mouth:  No deformity or lesions.  Oropharynx pale and dry Neck:  Supple; no masses or thyromegaly. Lungs:  No respiratory distress Abdomen:  Soft, mild protuberant, otherwise nontender and nondistended. No masses, hepatosplenomegaly or hernias noted. Normal bowel sounds, without guarding, and without rebound.     Msk:  Symmetrical without gross deformities. Normal posture. Pulses:  Normal pulses noted. Extremities:  Without clubbing or edema. Neurologic:  Alert and  oriented x4;  grossly normal neurologically. Skin:  Scattered ecchymoses, otherwise intact without significant lesions or rashes. Cervical Nodes:  No significant cervical adenopathy. Psych:  Alert and cooperative. Normal mood and affect.   Lab Results: Recent Labs    07/23/23 1635 07/24/23 0458  WBC 17.4* 13.1*  HGB 8.7* 6.6*  HCT 25.1* 19.7*  PLT 123* 93*   BMET Recent Labs    07/23/23 1635 07/24/23 0458  NA 132* 131*  K 2.5* 2.9*  CL 86* 95*  CO2 24 26  GLUCOSE 134* 120*  BUN 11 9  CREATININE 0.91 0.63  CALCIUM 9.0 7.5*   LFT Recent Labs    07/23/23 1635  PROT 7.0  ALBUMIN 3.8  AST 54*  ALT 27  ALKPHOS 154*  BILITOT 3.4*  BILIDIR 1.1*  IBILI 2.3*   PT/INR No results for input(s): "LABPROT", "INR" in the last 72  hours.  Studies/Results: ECHOCARDIOGRAM COMPLETE  Result Date: 07/24/2023    ECHOCARDIOGRAM REPORT   Patient Name:   RONAK DUQUETTE Date of Exam: 07/24/2023 Medical Rec #:  [id number]          Height:       69.5 in Accession #:    [id number]         Weight:       190.0 lb Date of Birth:  04-22-1961          BSA:          2.031 m Patient Age:    62 years           BP:           90/51 mmHg Patient Gender: M                  HR:           80 bpm. Exam Location:  Inpatient Procedure: 2D  Echo, Color Doppler and Cardiac Doppler Indications:    Syncope  History:        Patient has no prior history of Echocardiogram examinations.                 Signs/Symptoms:Syncope; Risk Factors:Hypertension, Dyslipidemia,                 alcohol abuse and Former Smoker. Hx hemochromatosis.  Sonographer:    Wallie Char Referring Phys: [id number] SUBRINA SUNDIL IMPRESSIONS  1. Left ventricular ejection fraction, by estimation, is 65 to 70%. The left ventricle has normal function. The left ventricle has no regional wall motion abnormalities. There is moderate asymmetric left ventricular hypertrophy of the basal and septal segments. Left ventricular diastolic parameters were normal.  2. Right ventricular systolic function is normal. The right ventricular size is normal.  3. The mitral valve is normal in structure. No evidence of mitral valve regurgitation. No evidence of mitral stenosis.  4. The aortic valve is tricuspid. Aortic valve regurgitation is not visualized. No aortic stenosis is present. Comparison(s): No prior Echocardiogram. FINDINGS  Left Ventricle: Left ventricular ejection fraction, by estimation, is 65 to 70%. The left ventricle has normal function. The left ventricle has no regional wall motion abnormalities. The left ventricular internal cavity size was normal in size. There is  moderate asymmetric left ventricular hypertrophy of the basal and septal segments. Left ventricular diastolic parameters were normal. Right  Ventricle: The right ventricular size is normal. No increase in right ventricular wall thickness. Right ventricular systolic function is normal. Left Atrium: Left atrial size was normal in size. Right Atrium: Right atrial size was normal in size. Pericardium: There is no evidence of pericardial effusion. Mitral Valve: The mitral valve is normal in structure. No evidence of mitral valve regurgitation. No evidence of mitral valve stenosis. MV peak gradient, 2.8 mmHg. The mean mitral valve gradient is 1.0 mmHg. Tricuspid Valve: The tricuspid valve is normal in structure. Tricuspid valve regurgitation is trivial. No evidence of tricuspid stenosis. Aortic Valve: The aortic valve is tricuspid. Aortic valve regurgitation is not visualized. No aortic stenosis is present. Aortic valve mean gradient measures 6.5 mmHg. Aortic valve peak gradient measures 9.5 mmHg. Aortic valve area, by VTI measures 2.12 cm. Pulmonic Valve: The pulmonic valve was grossly normal. Pulmonic valve regurgitation is not visualized. No evidence of pulmonic stenosis. Aorta: The aortic root and ascending aorta are structurally normal, with no evidence of dilitation. Venous: The inferior vena cava was not well visualized. IAS/Shunts: The interatrial septum was not well visualized.  LEFT VENTRICLE PLAX 2D LVIDd:         3.70 cm     Diastology LVIDs:         2.50 cm     LV e' medial:    8.05 cm/s LV PW:         1.00 cm     LV E/e' medial:  11.1 LV IVS:        1.40 cm     LV e' lateral:   10.20 cm/s LVOT diam:     1.90 cm     LV E/e' lateral: 8.8 LV SV:         65 LV SV Index:   32 LVOT Area:     2.84 cm  LV Volumes (MOD) LV vol d, MOD A2C: 82.0 ml LV vol d, MOD A4C: 83.3 ml LV vol s, MOD A2C: 26.2 ml LV vol s, MOD A4C: 27.6 ml LV SV MOD  A2C:     55.8 ml LV SV MOD A4C:     83.3 ml LV SV MOD BP:      56.0 ml RIGHT VENTRICLE RV Basal diam:  3.30 cm RV S prime:     14.20 cm/s TAPSE (M-mode): 2.0 cm LEFT ATRIUM             Index        RIGHT ATRIUM            Index LA diam:        3.30 cm 1.62 cm/m   RA Area:     13.00 cm LA Vol (A2C):   36.9 ml 18.16 ml/m  RA Volume:   28.00 ml  13.78 ml/m LA Vol (A4C):   43.4 ml 21.36 ml/m LA Biplane Vol: 40.9 ml 20.13 ml/m  AORTIC VALVE AV Area (Vmax):    2.04 cm AV Area (Vmean):   1.97 cm AV Area (VTI):     2.12 cm AV Vmax:           154.50 cm/s AV Vmean:          119.000 cm/s AV VTI:            0.308 m AV Peak Grad:      9.5 mmHg AV Mean Grad:      6.5 mmHg LVOT Vmax:         111.00 cm/s LVOT Vmean:        82.600 cm/s LVOT VTI:          0.230 m LVOT/AV VTI ratio: 0.75  AORTA Ao Root diam: 3.30 cm Ao Asc diam:  3.70 cm MITRAL VALVE               TRICUSPID VALVE MV Area (PHT): 4.15 cm    TR Peak grad:   18.7 mmHg MV Area VTI:   3.32 cm    TR Vmax:        216.00 cm/s MV Peak grad:  2.8 mmHg MV Mean grad:  1.0 mmHg    SHUNTS MV Vmax:       0.83 m/s    Systemic VTI:  0.23 m MV Vmean:      56.3 cm/s   Systemic Diam: 1.90 cm MV Decel Time: 183 msec MV E velocity: 89.30 cm/s MV A velocity: 84.90 cm/s MV E/A ratio:  1.05 Vishnu Priya Mallipeddi Electronically signed by Winfield Rast Mallipeddi Signature Date/Time: 07/24/2023/12:11:18 PM    Final    CT ABDOMEN PELVIS W CONTRAST  Result Date: 07/23/2023 CLINICAL DATA:  Abdominal pain, acute, nonlocalized EXAM: CT ABDOMEN AND PELVIS WITH CONTRAST TECHNIQUE: Multidetector CT imaging of the abdomen and pelvis was performed using the standard protocol following bolus administration of intravenous contrast. RADIATION DOSE REDUCTION: This exam was performed according to the departmental dose-optimization program which includes automated exposure control, adjustment of the mA and/or kV according to patient size and/or use of iterative reconstruction technique. CONTRAST:  OMNIPAQUE IOHEXOL 300 MG/ML  SOLN COMPARISON:  07/07/2021 FINDINGS: Lower chest: No acute abnormality Hepatobiliary: Diffuse low-density throughout the liver compatible with fatty infiltration. No focal abnormality.  Gallbladder unremarkable. Pancreas: No focal abnormality or ductal dilatation. Spleen: No focal abnormality.  Normal size. Adrenals/Urinary Tract: Adrenal glands are normal. Small bilateral renal cysts, the largest 1.8 cm in the midpole of the left kidney, unchanged. No follow-up imaging recommended. No stones or hydronephrosis. Urinary bladder obscured by beam hardening artifact from bilateral hip replacements. Stomach/Bowel: Stomach, large and small  bowel grossly unremarkable. Vascular/Lymphatic: Aortic atherosclerosis. No evidence of aneurysm or adenopathy. Reproductive: Obscured by beam hardening artifact from bilateral hip replacements. Other: No free fluid or free air. Musculoskeletal: Bilateral hip replacements. No acute bony abnormality. IMPRESSION: Hepatic steatosis. No acute findings. Aortic atherosclerosis. Electronically Signed   By: Charlett Nose M.D.   On: 07/23/2023 18:28   CT Head Wo Contrast  Result Date: 07/23/2023 CLINICAL DATA:  Head trauma, abnormal mental status (Age 45-64y). Dizziness, fall. EXAM: CT HEAD WITHOUT CONTRAST TECHNIQUE: Contiguous axial images were obtained from the base of the skull through the vertex without intravenous contrast. RADIATION DOSE REDUCTION: This exam was performed according to the departmental dose-optimization program which includes automated exposure control, adjustment of the mA and/or kV according to patient size and/or use of iterative reconstruction technique. COMPARISON:  09/26/2022 FINDINGS: Brain: No acute intracranial abnormality. Specifically, no hemorrhage, hydrocephalus, mass lesion, acute infarction, or significant intracranial injury. Vascular: No hyperdense vessel or unexpected calcification. Skull: No acute calvarial abnormality. Sinuses/Orbits: No acute findings Other: None IMPRESSION: No acute intracranial abnormality. Electronically Signed   By: Charlett Nose M.D.   On: 07/23/2023 18:25   DG Pelvis Portable  Result Date: 07/23/2023 CLINICAL  DATA:  Shortness of breath.  Occasional falls. EXAM: PORTABLE PELVIS 2 VIEWS COMPARISON:  None Available. FINDINGS: Bilateral hip arthroplasty identified with screw fixated acetabular cups and Press-Fit femoral components. Mild degenerative changes of the right sacroiliac joint. No acute fracture or dislocation. 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 Electronically Signed   By: Karen Kays M.D.   On: 07/23/2023 17:16   DG Chest Port 1 View  Result Date: 07/23/2023 CLINICAL DATA:  Shortness of breath EXAM: PORTABLE CHEST 1 VIEW COMPARISON:  04/12/2022 FINDINGS: Underinflation. No consolidation, pneumothorax or effusion. No edema. Normal cardiopericardial silhouette. Overlapping cardiac leads. Truncated appearance of the distal right clavicle. Chronic. IMPRESSION: Underinflation.  No acute cardiopulmonary disease. Electronically Signed   By: Karen Kays M.D.   On: 07/23/2023 17:15    Impression:  Dizziness, weakness, syncope. Anemia.  Macrocytic.  No overt GI bleeding.  Not convinced GI etiology, but can't rule out at this time. History B12 deficiency (levels currently ok) and folate deficiency (levels currently low). Hereditary hemochromatosis. Chronic alcohol use.  Plan:  Will plan endoscopy/colonoscopy Monday (schedule permitting; there is no non-emergent anesthesia availability over the weekend) to better exclude GI causes of anemia. CBCs; transfuse if needed. Vit B12/folate repletion. Alcohol abstinence. Risks (bleeding, infection, bowel perforation that could require surgery, sedation-related changes in cardiopulmonary systems), benefits (identification and possible treatment of source of symptoms, exclusion of certain causes of symptoms), and alternatives (watchful waiting, radiographic imaging studies, empiric medical treatment) of upper endoscopy (EGD) were explained to patient/family in detail and patient wishes to proceed.  Risks  (bleeding, infection, bowel perforation that could require surgery, sedation-related changes in cardiopulmonary systems), benefits (identification and possible treatment of source of symptoms, exclusion of certain causes of symptoms), and alternatives (watchful waiting, radiographic imaging studies, empiric medical treatment) of colonoscopy were explained to patient/family in detail and patient wishes to proceed.  Soft diet today, clear liquid diet tomorrow. [facility] will follow.   LOS: 1 day   Shyasia Funches M  07/24/2023, 1:56 PM  Cell [phone] If no answer or after 5 PM call [phone]

## 2023-07-24 NOTE — ED Notes (Signed)
ED TO INPATIENT HANDOFF REPORT  Name/Age/Gender Philip Humphrey Philip Humphrey 62 y.o. male  Code Status    Code Status Orders  (From admission, onward)           Start     Ordered   07/23/23 1954  Full code  Continuous       Question:  By:  Answer:  Consent: discussion documented in EHR   07/23/23 1955           Code Status History     Date Active Date Inactive Code Status Order ID Comments User Context   09/14/2022 2101 09/15/2022 1356 Full Code [id number]  Philip Humphrey ED   08/10/2022 2204 08/12/2022 1931 Full Code [id number]  Philip Giovanni, MD ED   04/12/2022 2201 04/14/2022 1819 Full Code [id number]  Philip Drop, DO ED   03/03/2022 1727 03/05/2022 1156 Full Code [id number]  Philip Lloyd, MD ED   07/07/2021 1752 07/09/2021 1725 Full Code [id number]  Philip Running, MD ED   08/03/2017 1051 08/04/2017 1306 Full Code [id number]  Philip Romans, MD Inpatient   11/24/2016 1152 11/25/2016 1432 Full Code [id number]  Philip Humphrey Inpatient       Home/SNF/Other Home  Chief Complaint Postural dizziness with near syncope [R42, R55] Postural dizziness [R42] Hypokalemia [E87.6]  Level of Care/Admitting Diagnosis ED Disposition     ED Disposition  Admit   Condition  --   Comment  Hospital Area: [facility] [[id number]]  Level of Care: Telemetry [5]  Admit to tele based on following criteria: Other see comments  Comments: electrolytes  May admit patient to [facility] or [facility] if equivalent level of care is available:: Yes  Covid Evaluation: Asymptomatic - no recent exposure (last 10 days) testing not required  Diagnosis: Hypokalemia [172180]  Admitting Physician: Philip Humphrey [[id number]]  Attending Physician: Philip Humphrey [[id number]]  Certification:: I certify this patient will need inpatient services for at least 2 midnights          Medical History Past Medical History:  Diagnosis Date   Allergy    occ. seasonal    Anxiety    Arthritis    Colon polyp    Condyloma    anal   Depression    GERD (gastroesophageal reflux disease)    History of transfusion    as child   Hypertension    Knee injury    right knee cap w piece broken off- MRI done 02-06-15-pending surgery. 11-18-16 right knee is still painful, both shoulders(limited ROM right).   Knee pain    Neuropathy    bilateral- greater left   Palpitations    normal stress test 10 yrs ago   Pneumonia    Thyroid disease    Thyroid removed unintentionally at age 33- no further thyroid tissue remains-uses daily supplement    Allergies Allergies  Allergen Reactions   Sulfa Antibiotics Anaphylaxis, Swelling and Other (See Comments)    Mouth and throat swelling   Oxycodone Itching   Nickel Rash and Other (See Comments)    Caused cold sores    IV Location/Drains/Wounds Patient Lines/Drains/Airways Status     Active Line/Drains/Airways     Name Placement date Placement time Site Days   Peripheral IV 07/23/23 22 G 1" Right Antecubital 07/23/23  1638  Antecubital  1   Peripheral IV 07/23/23 20 G Anterior;Left;Proximal Forearm 07/23/23  2025  Forearm  1   Incision (Closed) 02/15/15 Rectum 02/15/15  1029  --  3081   Incision - 2 Ports Abdomen Right;Upper Left;Upper 10/18/17  1501  -- 2105            Labs/Imaging Results for orders placed or performed during the hospital encounter of 07/23/23 (from the past 48 hour(s))  CBG monitoring, ED     Status: Abnormal   Collection Time: 07/23/23  4:12 PM  Result Value Ref Range   Glucose-Capillary 138 (H) 70 - 99 mg/dL    Comment: Glucose reference range applies only to samples taken after fasting for at least 8 hours.  Basic metabolic panel     Status: Abnormal   Collection Time: 07/23/23  4:35 PM  Result Value Ref Range   Sodium 132 (L) 135 - 145 mmol/L    Comment: ELECTROLYTES REPEATED TO VERIFY   Potassium 2.5 (LL) 3.5 - 5.1 mmol/L    Comment: CRITICAL RESULT CALLED TO, READ BACK BY AND  VERIFIED WITH FOX, B RN @ 1743 07/23/23. GILBERTL    Chloride 86 (L) 98 - 111 mmol/L    Comment: ELECTROLYTES REPEATED TO VERIFY   CO2 24 22 - 32 mmol/L    Comment: ELECTROLYTES REPEATED TO VERIFY   Glucose, Bld 134 (H) 70 - 99 mg/dL    Comment: Glucose reference range applies only to samples taken after fasting for at least 8 hours.   BUN 11 8 - 23 mg/dL   Creatinine, Ser 5.28 0.61 - 1.24 mg/dL   Calcium 9.0 8.9 - 41.3 mg/dL   GFR, Estimated >24 >40 mL/min    Comment: (NOTE) Calculated using the CKD-EPI Creatinine Equation (2021)    Anion gap 22 (H) 5 - 15    Comment: ELECTROLYTES REPEATED TO VERIFY Performed at [facility], [address] [place], [place] [address]   CBC     Status: Abnormal   Collection Time: 07/23/23  4:35 PM  Result Value Ref Range   WBC 17.4 (H) 4.0 - 10.5 K/uL   RBC 2.27 (L) 4.22 - 5.81 MIL/uL   Hemoglobin 8.7 (L) 13.0 - 17.0 g/dL   HCT 53.6 (L) 64.4 - 03.4 %   MCV 110.6 (H) 80.0 - 100.0 fL   MCH 38.3 (H) 26.0 - 34.0 pg   MCHC 34.7 30.0 - 36.0 g/dL   RDW 74.2 (H) 59.5 - 63.8 %   Platelets 123 (L) 150 - 400 K/uL    Comment: Immature Platelet Fraction may be clinically indicated, consider ordering this additional test [id number] CONSISTENT WITH PREVIOUS RESULT REPEATED TO VERIFY    nRBC 0.2 0.0 - 0.2 %    Comment: Performed at [facility], [address], [place], [place] [address]  Differential     Status: Abnormal   Collection Time: 07/23/23  4:35 PM  Result Value Ref Range   Neutrophils Relative % 84 %   Neutro Abs 14.6 (H) 1.7 - 7.7 K/uL   Lymphocytes Relative 5 %   Lymphs Abs 0.9 0.7 - 4.0 K/uL   Monocytes Relative 5 %   Monocytes Absolute 0.9 0.1 - 1.0 K/uL   Eosinophils Relative 5 %   Eosinophils Absolute 0.9 (H) 0.0 - 0.5 K/uL   Basophils Relative 0 %   Basophils Absolute 0.1 0.0 - 0.1 K/uL   Immature Granulocytes 1 %   Abs Immature Granulocytes 0.12 (H) 0.00 - 0.07 K/uL    Comment: Performed at  [facility], [address], [place], [place] [address]  Hepatic function panel     Status:  Abnormal   Collection Time: 07/23/23  4:35 PM  Result Value Ref Range   Total Protein 7.0 6.5 - 8.1 g/dL   Albumin 3.8 3.5 - 5.0 g/dL   AST 54 (H) 15 - 41 U/L   ALT 27 0 - 44 U/L   Alkaline Phosphatase 154 (H) 38 - 126 U/L   Total Bilirubin 3.4 (H) 0.3 - 1.2 mg/dL   Bilirubin, Direct 1.1 (H) 0.0 - 0.2 mg/dL   Indirect Bilirubin 2.3 (H) 0.3 - 0.9 mg/dL    Comment: Performed at [facility], [address] [place], [place] [address]  Lactic acid, plasma     Status: None   Collection Time: 07/23/23  8:05 PM  Result Value Ref Range   Lactic Acid, Venous 1.6 0.5 - 1.9 mmol/L    Comment: Performed at [facility], [address], [place], [place] [address]  Vitamin B12     Status: None   Collection Time: 07/23/23  8:05 PM  Result Value Ref Range   Vitamin B-12 319 180 - 914 pg/mL    Comment: (NOTE) This assay is not validated for testing neonatal or myeloproliferative syndrome specimens for Vitamin B12 levels. Performed at [facility], [address], [place], [place] [address]   Folate     Status: Abnormal   Collection Time: 07/23/23  8:05 PM  Result Value Ref Range   Folate 2.3 (L) >5.9 ng/mL    Comment: Performed at [facility], [address], [place], [place] [address]  Iron and TIBC     Status: Abnormal   Collection Time: 07/23/23  8:05 PM  Result Value Ref Range   Iron 179 45 - 182 ug/dL   TIBC 295 (L) 621 - 308 ug/dL   Saturation Ratios 90 (H) 17.9 - 39.5 %   UIBC 20 ug/dL    Comment: Performed at [facility], [address], [place], [place] [address]  Ferritin     Status: Abnormal   Collection Time: 07/23/23  8:05 PM  Result Value Ref Range   Ferritin 372 (H) 24 - 336 ng/mL    Comment: Performed at [facility], [address],  [place], [place] [address]  Lactate dehydrogenase     Status: None   Collection Time: 07/23/23  8:05 PM  Result Value Ref Range   LDH 107 98 - 192 U/L    Comment: Performed at [facility], [address] [place], [place] [address]  Magnesium     Status: Abnormal   Collection Time: 07/23/23  8:05 PM  Result Value Ref Range   Magnesium 1.6 (L) 1.7 - 2.4 mg/dL    Comment: Performed at [facility], [address], [place], [place] [address]  Phosphorus     Status: Abnormal   Collection Time: 07/23/23  8:05 PM  Result Value Ref Range   Phosphorus 1.8 (L) 2.5 - 4.6 mg/dL    Comment: Performed at [facility], [address], [place], [place] [address]  TSH     Status: Abnormal   Collection Time: 07/23/23  8:05 PM  Result Value Ref Range   TSH 13.977 (H) 0.350 - 4.500 uIU/mL    Comment: Performed by Philip 3rd Generation assay with Philip functional sensitivity of <=0.01 uIU/mL. Performed at [facility], [address] [place], [place] [address]   Urinalysis, Routine w reflex microscopic -Urine, Clean Catch  Status: Abnormal   Collection Time: 07/24/23  3:30 AM  Result Value Ref Range   Color, Urine AMBER (Philip) YELLOW    Comment: BIOCHEMICALS MAY BE AFFECTED BY COLOR   APPearance CLEAR CLEAR   Specific Gravity, Urine >1.046 (H) 1.005 - 1.030   pH 5.0 5.0 - 8.0   Glucose, UA NEGATIVE NEGATIVE mg/dL   Hgb urine dipstick NEGATIVE NEGATIVE   Bilirubin Urine SMALL (Philip) NEGATIVE   Ketones, ur 20 (Philip) NEGATIVE mg/dL   Protein, ur 30 (Philip) NEGATIVE mg/dL   Nitrite NEGATIVE NEGATIVE   Leukocytes,Ua NEGATIVE NEGATIVE   RBC / HPF 0-5 0 - 5 RBC/hpf   WBC, UA 0-5 0 - 5 WBC/hpf   Bacteria, UA NONE SEEN NONE SEEN   Squamous Epithelial / HPF 0-5 0 - 5 /HPF   Mucus PRESENT     Comment: Performed at [facility], [address], [place], [place] [address]  Rapid urine drug screen (hospital performed)     Status: None    Collection Time: 07/24/23  3:30 AM  Result Value Ref Range   Opiates NONE DETECTED NONE DETECTED   Cocaine NONE DETECTED NONE DETECTED   Benzodiazepines NONE DETECTED NONE DETECTED   Amphetamines NONE DETECTED NONE DETECTED   Tetrahydrocannabinol NONE DETECTED NONE DETECTED   Barbiturates NONE DETECTED NONE DETECTED    Comment: (NOTE) DRUG SCREEN FOR MEDICAL PURPOSES ONLY.  IF CONFIRMATION IS NEEDED FOR ANY PURPOSE, NOTIFY LAB WITHIN 5 DAYS.  LOWEST DETECTABLE LIMITS FOR URINE DRUG SCREEN Drug Class                     Cutoff (ng/mL) Amphetamine and metabolites    1000 Barbiturate and metabolites    200 Benzodiazepine                 200 Opiates and metabolites        300 Cocaine and metabolites        300 THC                            50 Performed at [facility], [address] [place], [place] [address]   Magnesium     Status: None   Collection Time: 07/24/23  4:58 AM  Result Value Ref Range   Magnesium 2.0 1.7 - 2.4 mg/dL    Comment: Performed at [facility], [address], [place], [place] [address]  Phosphorus     Status: Abnormal   Collection Time: 07/24/23  4:58 AM  Result Value Ref Range   Phosphorus 1.1 (L) 2.5 - 4.6 mg/dL    Comment: Performed at [facility], [address] [place], [place] [address]  Lactic acid, plasma     Status: None   Collection Time: 07/24/23  4:58 AM  Result Value Ref Range   Lactic Acid, Venous 1.2 0.5 - 1.9 mmol/L    Comment: Performed at [facility], [address], [place], [place] [address]  CBC     Status: Abnormal   Collection Time: 07/24/23  4:58 AM  Result Value Ref Range   WBC 13.1 (H) 4.0 - 10.5 K/uL   RBC 1.77 (L) 4.22 - 5.81 MIL/uL   Hemoglobin 6.6 (LL) 13.0 - 17.0 g/dL    Comment: REPEATED TO VERIFY THIS CRITICAL RESULT HAS VERIFIED AND BEEN CALLED TO ABREU,D BY PATRICIA LUZOLO ON 08 03 2024 AT 0555,  AND HAS BEEN READ BACK. CRITICAL  RESULT VERIFIED    HCT 19.7 (L) 39.0 - 52.0 %   MCV 111.3 (H) 80.0 - 100.0 fL   MCH 37.3 (H) 26.0 - 34.0 pg   MCHC 33.5 30.0 - 36.0 g/dL   RDW 16.1 (H) 09.6 - 04.5 %   Platelets 93 (L) 150 - 400 K/uL    Comment: SPECIMEN CHECKED FOR CLOTS Immature Platelet Fraction may be clinically indicated, consider ordering this additional test [id number] REPEATED TO VERIFY    nRBC 0.2 0.0 - 0.2 %    Comment: Performed at [facility], [address], [place], [place] [address]  Basic metabolic panel     Status: Abnormal   Collection Time: 07/24/23  4:58 AM  Result Value Ref Range   Sodium 131 (L) 135 - 145 mmol/L   Potassium 2.9 (L) 3.5 - 5.1 mmol/L   Chloride 95 (L) 98 - 111 mmol/L   CO2 26 22 - 32 mmol/L   Glucose, Bld 120 (H) 70 - 99 mg/dL    Comment: Glucose reference range applies only to samples taken after fasting for at least 8 hours.   BUN 9 8 - 23 mg/dL   Creatinine, Ser 5.62 0.61 - 1.24 mg/dL   Calcium 7.5 (L) 8.9 - 10.3 mg/dL   GFR, Estimated >13 >08 mL/min    Comment: (NOTE) Calculated using the CKD-EPI Creatinine Equation (2021)    Anion gap 10 5 - 15    Comment: Performed at [facility], [address] [place], [place] [address]  Type and screen [facility]     Status: None (Preliminary result)   Collection Time: 07/24/23  6:53 AM  Result Value Ref Range   ABO/RH(D) Philip POS    Antibody Screen NEG    Sample Expiration 07/27/2023,2359    Unit Number [id number]    Blood Component Type RED CELLS,LR    Unit division 00    Status of Unit ALLOCATED    Transfusion Status OK TO TRANSFUSE    Crossmatch Result Compatible    Unit Number [id number]    Blood Component Type RED CELLS,LR    Unit division 00    Status of Unit ISSUED    Transfusion Status OK TO TRANSFUSE    Crossmatch Result      Compatible Performed at [facility], [address], [place], [place] [address]   Prepare RBC  (crossmatch)     Status: None   Collection Time: 07/24/23  6:53 AM  Result Value Ref Range   Order Confirmation      ORDER PROCESSED BY BLOOD BANK Performed at [facility], [address], [place], [place] [address]    CT ABDOMEN PELVIS W CONTRAST  Result Date: 07/23/2023 CLINICAL DATA:  Abdominal pain, acute, nonlocalized EXAM: CT ABDOMEN AND PELVIS WITH CONTRAST TECHNIQUE: Multidetector CT imaging of the abdomen and pelvis was performed using the standard protocol following bolus administration of intravenous contrast. RADIATION DOSE REDUCTION: This exam was performed according to the departmental dose-optimization program which includes automated exposure control, adjustment of the mA and/or kV according to patient size and/or use of iterative reconstruction technique. CONTRAST:  OMNIPAQUE IOHEXOL 300 MG/ML  SOLN COMPARISON:  07/07/2021 FINDINGS: Lower chest: No acute abnormality Hepatobiliary: Diffuse low-density throughout the liver compatible with fatty infiltration. No focal abnormality. Gallbladder unremarkable. Pancreas: No focal abnormality or ductal dilatation. Spleen: No focal abnormality.  Normal size. Adrenals/Urinary Tract: Adrenal  glands are normal. Small bilateral renal cysts, the largest 1.8 cm in the midpole of the left kidney, unchanged. No follow-up imaging recommended. No stones or hydronephrosis. Urinary bladder obscured by beam hardening artifact from bilateral hip replacements. Stomach/Bowel: Stomach, large and small bowel grossly unremarkable. Vascular/Lymphatic: Aortic atherosclerosis. No evidence of aneurysm or adenopathy. Reproductive: Obscured by beam hardening artifact from bilateral hip replacements. Other: No free fluid or free air. Musculoskeletal: Bilateral hip replacements. No acute bony abnormality. IMPRESSION: Hepatic steatosis. No acute findings. Aortic atherosclerosis. Electronically Signed   By: Charlett Nose M.D.   On: 07/23/2023 18:28    CT Head Wo Contrast  Result Date: 07/23/2023 CLINICAL DATA:  Head trauma, abnormal mental status (Age 23-64y). Dizziness, fall. EXAM: CT HEAD WITHOUT CONTRAST TECHNIQUE: Contiguous axial images were obtained from the base of the skull through the vertex without intravenous contrast. RADIATION DOSE REDUCTION: This exam was performed according to the departmental dose-optimization program which includes automated exposure control, adjustment of the mA and/or kV according to patient size and/or use of iterative reconstruction technique. COMPARISON:  09/26/2022 FINDINGS: Brain: No acute intracranial abnormality. Specifically, no hemorrhage, hydrocephalus, mass lesion, acute infarction, or significant intracranial injury. Vascular: No hyperdense vessel or unexpected calcification. Skull: No acute calvarial abnormality. Sinuses/Orbits: No acute findings Other: None IMPRESSION: No acute intracranial abnormality. Electronically Signed   By: Charlett Nose M.D.   On: 07/23/2023 18:25   DG Pelvis Portable  Result Date: 07/23/2023 CLINICAL DATA:  Shortness of breath.  Occasional falls. EXAM: PORTABLE PELVIS 2 VIEWS COMPARISON:  None Available. FINDINGS: Bilateral hip arthroplasty identified with screw fixated acetabular cups and Press-Fit femoral components. Mild degenerative changes of the right sacroiliac joint. No acute fracture or dislocation. 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 Electronically Signed   By: Karen Kays M.D.   On: 07/23/2023 17:16   DG Chest Port 1 View  Result Date: 07/23/2023 CLINICAL DATA:  Shortness of breath EXAM: PORTABLE CHEST 1 VIEW COMPARISON:  04/12/2022 FINDINGS: Underinflation. No consolidation, pneumothorax or effusion. No edema. Normal cardiopericardial silhouette. Overlapping cardiac leads. Truncated appearance of the distal right clavicle. Chronic. IMPRESSION: Underinflation.  No acute cardiopulmonary disease. Electronically  Signed   By: Karen Kays M.D.   On: 07/23/2023 17:15    Pending Labs Unresulted Labs (From admission, onward)     Start     Ordered   07/25/23 0500  CBC  Tomorrow morning,   R        07/24/23 0426   07/25/23 0500  Comprehensive metabolic panel  Tomorrow morning,   R        07/24/23 0426   07/24/23 0800  Basic metabolic panel  3 times daily,   R      07/23/23 1933   07/24/23 0800  Hemoglobin and hematocrit, blood  3 times daily,   R      07/23/23 2005   07/24/23 0500  Magnesium  Daily,   R      07/23/23 2315   07/24/23 0500  Phosphorus  Daily,   R      07/23/23 2315   07/23/23 2030  Reticulocytes  Once,   AD        07/23/23 2030   07/23/23 2030  HIV Antibody (routine testing w rflx)  Once,   R        07/23/23 2030   07/23/23 1929  Occult blood card to lab, stool  Once,   URGENT  Question:  Release to patient  Answer:  Immediate   07/23/23 1928   07/23/23 1928  Ethanol  Add-on,   AD        07/23/23 1927            Vitals/Pain Today's Vitals   07/24/23 1047 07/24/23 1056 07/24/23 1102 07/24/23 1203  BP: 99/63  100/75 109/74  Pulse: 88  90 92  Resp: 17  17   Temp: 97.7 F (36.5 C)  97.8 F (36.6 C)   TempSrc: Oral  Oral   SpO2: 99%  98% 97%  Weight:      Height:      PainSc:  0-No pain      Isolation Precautions No active isolations  Medications Medications  hydrOXYzine (ATARAX) tablet 25 mg (25 mg Oral Given 07/24/23 0936)  sertraline (ZOLOFT) tablet 50 mg (50 mg Oral Given 07/23/23 2329)  levothyroxine (SYNTHROID) tablet 175 mcg (175 mcg Oral Given 07/24/23 0507)  bismuth subsalicylate (PEPTO BISMOL) 262 MG/15ML suspension 30 mL (has no administration in time range)  calcium carbonate (TUMS - dosed in mg elemental calcium) chewable tablet 400 mg of elemental calcium (has no administration in time range)  famotidine (PEPCID) tablet 40 mg (has no administration in time range)  vitamin B-12 (CYANOCOBALAMIN) tablet 1,000 mcg (has no administration in time range)   folic acid (FOLVITE) tablet 1 mg (1 mg Oral Given 07/24/23 0936)  melatonin tablet 5 mg (has no administration in time range)  sodium chloride flush (NS) 0.9 % injection 3 mL (3 mLs Intravenous Given 07/24/23 0940)  sodium chloride flush (NS) 0.9 % injection 3 mL (has no administration in time range)  0.9 %  sodium chloride infusion (has no administration in time range)  acetaminophen (TYLENOL) tablet 650 mg (650 mg Oral Given 07/24/23 0936)    Or  acetaminophen (TYLENOL) suppository 650 mg ( Rectal See Alternative 07/24/23 0936)  ondansetron (ZOFRAN) tablet 4 mg (has no administration in time range)    Or  ondansetron (ZOFRAN) injection 4 mg (has no administration in time range)  senna-docusate (Senokot-S) tablet 1 tablet (has no administration in time range)  potassium & sodium phosphates (PHOS-NAK) 280-160-250 MG packet 1 packet (has no administration in time range)  0.9 %  sodium chloride infusion (0 mLs Intravenous Stopped 07/24/23 1056)  midodrine (PROAMATINE) tablet 5 mg (5 mg Oral Given 07/24/23 0855)  0.9 %  sodium chloride infusion (Manually program via Guardrails IV Fluids) (has no administration in time range)  potassium PHOSPHATE 30 mmol in dextrose 5 % 500 mL infusion (30 mmol Intravenous New Bag/Given 07/24/23 0938)  sodium chloride 0.9 % bolus 1,000 mL (0 mLs Intravenous Stopped 07/24/23 0705)  potassium chloride SA (KLOR-CON M) CR tablet 40 mEq (40 mEq Oral Given 07/23/23 1826)  potassium chloride 10 mEq in 100 mL IVPB (0 mEq Intravenous Stopped 07/24/23 0706)  sodium chloride 0.9 % bolus 1,000 mL (0 mLs Intravenous Stopped 07/24/23 0743)  iohexol (OMNIPAQUE) 300 MG/ML solution 100 mL (100 mLs Intravenous Contrast Given 07/23/23 1800)  potassium chloride 10 mEq in 100 mL IVPB (0 mEq Intravenous Stopped 07/24/23 0743)  alum & mag hydroxide-simeth (MAALOX/MYLANTA) 200-200-20 MG/5ML suspension 30 mL (30 mLs Oral Given 07/23/23 2120)  magnesium sulfate IVPB 2 g 50 mL (0 g Intravenous Stopped 07/24/23 0743)     Mobility walks with device

## 2023-07-24 NOTE — ED Notes (Signed)
Orth Vital : Laying 88/46 114/ 77 sitting

## 2023-07-24 NOTE — Progress Notes (Signed)
  Progress Note   Patient: Philip Humphrey UJW:[id number] DOB: 03-Sep-1961 DOA: 07/23/2023     1 DOS: the patient was seen and examined on 07/24/2023 at 10:05AM      Brief hospital course: Philip Humphrey is a 62 y.o. M with hypothyroidism, hemochromatosis, chronic alcohol use, B12 deficiency, and thrombocytopenia who presented with dizziness.  In the ER, found to have BP 100, K 2.5, mag 1.6, Na 132, phos 1.1 and elevated bilirubin.  Hgb also dropped to 6.6 with fluid repletion.  GI consulted.     Assessment and Plan: Dizziness due to hypokalemia, dehydration, hypophosphatemia, hyponatremia and anemia See summary from Dr. Janalyn Humphrey, acute on chronic component to symptoms.  Here found to have several electrolyte derangements and anemia which are all likely contributing.  CTH unremarkable, doubt stroke.  Echo with normal EF, normal valves, clinically insignificant septal asymmetry. - Supplement K - Supplement Phos - IV fluids and trend Na   Macrocytic anemia History of vitamin B12 deficiency Folate deficiency - Supplement Folate and B12 - Consult GI, appreciate cares  Hypertension BP low.  Has midodrine, lisinopril, and olmesartan all on his med list.  On my questioning, he really doesn't know what he is taking. - Resume midodrine - IV fluids - Hold lisinopril and olmesartan  Hyponatremia - Hold sertraline  Hypothyoridism - Continue levothyroxine  Alcohol use disorder Recommend cessation  Thrombocytopenia Slight dilution, overall stable  Hemochromatosis - Outpatient follow up  Transaminitis Likely due to prior alcohol use, possibly cirrhosis  - Consult GI, apprecaite cares  Peripheral neuropathy - Hold gabapentin  Hyperlipidemia - Hold Lipitor for now        Subjective: Patient fels okay just lying in bed, but overall weak.  No confusion, no seizures, no fever, no dairrhea or vomiting.     Physical Exam: BP (!) 98/59 (BP Location: Right Arm)   Pulse 84    Temp 97.8 F (36.6 C) (Oral)   Resp 18   Ht 5' 9.5" (1.765 m)   Wt 86.2 kg   SpO2 100%   BMI 27.66 kg/m   Thin adult male, lying in bed, interactive and appropriate RRR, no murmurs, no peripheral edema Respiratory rate normal, lungs clear without rales or wheezes   Data Reviewed: Total bilirubin elevated Folate low Iron level elevated Potassium up to 2.9 Sodium: 131 Phosphate low  Family Communication:     Disposition: Status is: Inpatient The patient was admitted with dizziness from multiple electrolyte derangements and anemia  GI will see him and do an endoscopy on Monday, after which if his electrolytes are repleted he may be able to go home        Author: Alberteen Sam, MD 07/24/2023 7:57 PM  For on call review [email or web address].

## 2023-07-24 NOTE — Significant Event (Signed)
  Around 5:15 AM patient blood pressure dropped to 83/54.  MAP is 64.  Give patient normal saline bolus 500 mL and resumed home midodrine 5 mg 3 times daily.  Ordered stat CBC, BMP and lactic acid. -Hemoglobin dropped 8.9 to 6.6.  No evidence of external source of bleeding. - BMP showed persistent low potassium 2.9, low chloride and normal BUN 9. Replating with KCl 40 meq 3 times daily. -Unclear source of bleeding. - Consulted with inpatient GI Dr. Dulce Sellar for evaluation.  Discussed imaging CT abdomen pelvis with contrast that was done earlier which did not showed any evidence of source of bleeding except showed hepatic steatosis and aortic atherosclerosis.  Patient does not have any abdominal pain and no external source of bleeding like hematemesis, melena and bright red blood per rectum.  After discussing with Dr. Dulce Sellar plan to do conservative management for now.   Appreciate GI input. - Preparing to unit of blood and transfusing 1 unit of blood now.  Continue to monitor H&H every 6 hours and transfuse as needed.   Tereasa Coop, MD [facility] 07/24/2023, 6:21 AM

## 2023-07-24 NOTE — Evaluation (Signed)
Physical Therapy Evaluation Patient Details Name: Philip Humphrey MRN: [id number] DOB: September 09, 1961 Today's Date: 07/24/2023  History of Present Illness  Pt is a 62yo male presenting to [facility] ED on 8/2 dizziness with syncope, found to be anemic, received 1 unit PRBC. CT head negative for acute abnormality.  PMH: anxiety & depression, GERD, HTN, neuropathy, R-RCR, L-RCR, L-THA, R-THA.   Clinical Impression  Pt presents with chief complaint of dizziness and falls over the course of the past year with worsening frequency, duration, and severity, most recently fell and hit his head ~5days and has bruising on top of scalp as well as bilateral arms. Mild L eye floating to lateral side with Test of Skew (Cover/uncover test) without symptomatic report; pt symptomatic with horizontal head movements (cervical ROM WFL) as well as horizontal eye movements and positional changes; however, pt did not tolerate much testing so may need additional assessment (see vestibular evaluation chart below for full details). Pt also endorsed 6/10 L lower rib pain very tender to palpation (approximately ribs 8-10) over LUQ of abdomen, endorsed 5/10 R lower rib pain but stated L worse than R, also endorsed pain when lying supine and attempting rolling for vestibular exam, no external bruising noted, pt may have distended abdomen; communicated this to nursing staff; this PT examined chest x-ray 8/2 but unable to visualize lower ribs in image. Pt required up to minA for bed mobility and transfers, orthostatic vitals completed with symptomatic report of dizziness in standing but without significant BP drop. Pt would benefit from continued acute skilled therapy to address the functional mobility deficits noted above.    VESTIBULAR EVALUATION   07/24/23 0001  Vestibular Assessment  General Observation Pt with several bruises, top of scalp, bilateral arms.  Symptom Behavior  Subjective history of current problem Pt states he was outside  checking out the scenery while standing and the next thing he knew he was sitting down; been occurring over a year and triggers can including looking around too fast, moving too fast. Pt endorses drinking but reports no problems while drinking.  Type of Dizziness  Comment ("Buzzed")  Frequency of Dizziness 2-3x/week  Duration of Dizziness 30s or less  Symptom Nature Positional;Motion provoked  Aggravating Factors Moving eyes;Supine to sit;Turning body quickly;Turning head quickly  Relieving Factors Lying supine;Head stationary;Closing eyes;Rest  Progression of Symptoms Worse  Oculomotor Exam  Oculomotor Alignment Normal  Ocular ROM normal  Spontaneous Absent  Gaze-induced  Absent  Head shaking Horizontal Absent  Smooth Pursuits Intact  Saccades Intact  Comment Dizziness with head turns in horizontal  Oculomotor Exam-Fixation Suppressed   Left Head Impulse negative, but pt with poor tolerance for testing.  Right Head Impulse negative, but pt with poor tolerance for testing.  Head Shaking Nystagmus-Horizontal negative  Positional Testing  Dix-Hallpike Dix-Hallpike Left (negative)  Horizontal Canal Testing Horizontal Canal Left (possible L nystagmus but pt did not tolerate full test)  Dix-Hallpike Left  Dix-Hallpike Left Duration 60  Dix-Hallpike Left Symptoms No nystagmus  Horizontal Canal Left  Horizontal Canal Left Duration 30  Horizontal Canal Left Symptoms Normal  Cognition  Cognition Orientation Level Oriented x 4  Positional Sensitivities  Supine to Sitting 2  Head Turning x 5 2  Rolling Right 2  Rolling Left 2  Positional Sensitivities Comments Pt reporting mild dizziness with STS, supine to sit, and rolling as well as horizontal head movements  Orthostatics  BP supine (x 5 minutes) 102/68  HR supine (x 5 minutes) 85  BP sitting  102/62  HR sitting 88  BP standing (after 1 minute) 91/66  HR standing (after 1 minute) 100  BP standing (after 3 minutes) 108/68  HR  standing (after 3 minutes) 100       If plan is discharge home, recommend the following: Assist for transportation;Help with stairs or ramp for entrance;A little help with bathing/dressing/bathroom;A little help with walking and/or transfers   Can travel by private vehicle        Equipment Recommendations None recommended by PT  Recommendations for Other Services       Functional Status Assessment Patient has had a recent decline in their functional status and demonstrates the ability to make significant improvements in function in a reasonable and predictable amount of time.     Precautions / Restrictions Precautions Precautions: Fall Precaution Comments: dizziness, history of repeated falls Restrictions Weight Bearing Restrictions: No      Mobility  Bed Mobility Overal bed mobility: Modified Independent, Needs Assistance Bed Mobility: Supine to Sit     Supine to sit: Min assist     General bed mobility comments: Pt initially modified independent but with repeated mobility required min A, likely secondary to rib pain and dizziness    Transfers Overall transfer level: Needs assistance Equipment used: Rolling walker (2 wheels) Transfers: Sit to/from Stand Sit to Stand: Min assist           General transfer comment: Min assist for lift assistance.    Ambulation/Gait Ambulation/Gait assistance: Min guard Gait Distance (Feet): 23 Feet Assistive device: Rolling walker (2 wheels) Gait Pattern/deviations: Step-through pattern, Decreased stride length, Trunk flexed Gait velocity: decreased     General Gait Details: Pt ambulated with RW and min guard, no overt LOB noted or physical assist rqeuired, pt with trunk flexed and with one short standing rest break with forearms on RW grips, reporting inreased dizziness so returned to room.  Stairs            Wheelchair Mobility     Tilt Bed    Modified Rankin (Stroke Patients Only)       Balance Overall  balance assessment: Needs assistance Sitting-balance support: Feet supported, No upper extremity supported Sitting balance-Leahy Scale: Good     Standing balance support: Reliant on assistive device for balance, During functional activity, Single extremity supported Standing balance-Leahy Scale: Fair                               Pertinent Vitals/Pain Pain Assessment Pain Assessment: 0-10 Pain Score: 6  Pain Location: Lower ribs, L>R but bilaterally Pain Descriptors / Indicators: Discomfort, Grimacing, Moaning, Sharp Pain Intervention(s): Monitored during session, Repositioned, Other (comment) (notified RN)    Home Living Family/patient expects to be discharged to:: Private residence Living Arrangements: Alone   Type of Home: House Home Access: Stairs to enter Entrance Stairs-Rails: Left Entrance Stairs-Number of Steps: 4   Home Layout: One level Home Equipment: Agricultural consultant (2 wheels);Cane - single point;Tub bench      Prior Function Prior Level of Function : Independent/Modified Independent;History of Falls (last six months)             Mobility Comments: RW or SPC but endorses 5 falls in the last 3 months. ADLs Comments: Driving, IND. I feel most comfortable in the car, like the motion evens out my dizziness.     Hand Dominance   Dominant Hand: Right    Extremity/Trunk Assessment  Upper Extremity Assessment Upper Extremity Assessment: Overall WFL for tasks assessed    Lower Extremity Assessment Lower Extremity Assessment: Overall WFL for tasks assessed    Cervical / Trunk Assessment Cervical / Trunk Assessment: Normal  Communication   Communication: No difficulties  Cognition Arousal/Alertness: Awake/alert Behavior During Therapy: WFL for tasks assessed/performed, Flat affect Overall Cognitive Status: Within Functional Limits for tasks assessed                                          General Comments       Exercises     Assessment/Plan    PT Assessment Patient needs continued PT services  PT Problem List Decreased activity tolerance;Decreased balance;Decreased coordination;Pain       PT Treatment Interventions DME instruction;Gait training;Stair training;Functional mobility training;Therapeutic activities;Therapeutic exercise;Balance training;Wheelchair mobility training;Patient/family education;Neuromuscular re-education    PT Goals (Current goals can be found in the Care Plan section)  Acute Rehab PT Goals Patient Stated Goal: to feel better. PT Goal Formulation: With patient Time For Goal Achievement: 08/07/23 Potential to Achieve Goals: Good    Frequency Min 1X/week     Co-evaluation               AM-PAC PT "6 Clicks" Mobility  Outcome Measure Help needed turning from your back to your side while in a flat bed without using bedrails?: A Little Help needed moving from lying on your back to sitting on the side of a flat bed without using bedrails?: A Little Help needed moving to and from a bed to a chair (including a wheelchair)?: A Little Help needed standing up from a chair using your arms (e.g., wheelchair or bedside chair)?: A Little Help needed to walk in hospital room?: A Little Help needed climbing 3-5 steps with a railing? : A Lot 6 Click Score: 17    End of Session Equipment Utilized During Treatment: Gait belt Activity Tolerance: Patient limited by pain Patient left: in bed;with call bell/phone within reach;with bed alarm set Nurse Communication: Mobility status;Other (comment) (Pain in ribs suggestive of fx) PT Visit Diagnosis: Dizziness and giddiness (R42);Difficulty in walking, not elsewhere classified (R26.2);History of falling (Z91.81)    Time: 1625-1730 PT Time Calculation (min) (ACUTE ONLY): 65 min   Charges:   PT Evaluation $PT Eval Moderate Complexity: 1 Mod PT Treatments $Gait Training: 8-22 mins $Therapeutic Activity: 8-22 mins $Canalith  Rep Proc: 8-22 mins PT General Charges $$ ACUTE PT VISIT: 1 Visit         Jamesetta Geralds, PT, DPT [facility] Office: [phone]  Jamesetta Geralds 07/24/2023, 5:59 PM

## 2023-07-24 NOTE — Hospital Course (Addendum)
Mr Decandia is a 62 y.o. M with hypothyroidism, hemochromatosis, chronic alcohol use, B12 deficiency, and thrombocytopenia who presented with dizziness.  In the ER, found to have BP 100, K 2.5, mag 1.6, Na 132, phos 1.1 and elevated bilirubin.  Hgb also dropped to 6.6 with fluid repletion.  GI consulted.

## 2023-07-25 DIAGNOSIS — R42 Dizziness and giddiness: Secondary | ICD-10-CM

## 2023-07-25 DIAGNOSIS — F1090 Alcohol use, unspecified, uncomplicated: Secondary | ICD-10-CM

## 2023-07-25 DIAGNOSIS — E876 Hypokalemia: Secondary | ICD-10-CM | POA: Diagnosis not present

## 2023-07-25 DIAGNOSIS — K760 Fatty (change of) liver, not elsewhere classified: Secondary | ICD-10-CM

## 2023-07-25 DIAGNOSIS — I959 Hypotension, unspecified: Secondary | ICD-10-CM

## 2023-07-25 DIAGNOSIS — D693 Immune thrombocytopenic purpura: Secondary | ICD-10-CM

## 2023-07-25 DIAGNOSIS — R55 Syncope and collapse: Secondary | ICD-10-CM

## 2023-07-25 DIAGNOSIS — E538 Deficiency of other specified B group vitamins: Secondary | ICD-10-CM

## 2023-07-25 LAB — PHOSPHORUS: Phosphorus: 2.3 mg/dL — ABNORMAL LOW (ref 2.5–4.6)

## 2023-07-25 LAB — COMPREHENSIVE METABOLIC PANEL WITH GFR
ALT: 19 U/L (ref 0–44)
AST: 44 U/L — ABNORMAL HIGH (ref 15–41)
Albumin: 2.9 g/dL — ABNORMAL LOW (ref 3.5–5.0)
Alkaline Phosphatase: 115 U/L (ref 38–126)
Anion gap: 9 (ref 5–15)
BUN: 6 mg/dL — ABNORMAL LOW (ref 8–23)
CO2: 28 mmol/L (ref 22–32)
Calcium: 8.3 mg/dL — ABNORMAL LOW (ref 8.9–10.3)
Chloride: 95 mmol/L — ABNORMAL LOW (ref 98–111)
Creatinine, Ser: 0.55 mg/dL — ABNORMAL LOW (ref 0.61–1.24)
GFR, Estimated: >60 mL/min (ref >60)
Glucose, Bld: 118 mg/dL — ABNORMAL HIGH (ref 70–99)
Potassium: 3 mmol/L — ABNORMAL LOW (ref 3.5–5.1)
Sodium: 132 mmol/L — ABNORMAL LOW (ref 135–145)
Total Bilirubin: 1.6 mg/dL — ABNORMAL HIGH (ref 0.3–1.2)
Total Protein: 5.4 g/dL — ABNORMAL LOW (ref 6.5–8.1)

## 2023-07-25 LAB — CBC
HCT: 24.3 % — ABNORMAL LOW (ref 39.0–52.0)
Hemoglobin: 8.4 g/dL — ABNORMAL LOW (ref 13.0–17.0)
MCH: 36.1 pg — ABNORMAL HIGH (ref 26.0–34.0)
MCHC: 34.6 g/dL (ref 30.0–36.0)
MCV: 104.3 fL — ABNORMAL HIGH (ref 80.0–100.0)
Platelets: 92 10*3/uL — ABNORMAL LOW (ref 150–400)
RBC: 2.33 MIL/uL — ABNORMAL LOW (ref 4.22–5.81)
RDW: 20.5 % — ABNORMAL HIGH (ref 11.5–15.5)
WBC: 9.1 10*3/uL (ref 4.0–10.5)
nRBC: 0 % (ref 0.0–0.2)

## 2023-07-25 LAB — MAGNESIUM: Magnesium: 1.6 mg/dL — ABNORMAL LOW (ref 1.7–2.4)

## 2023-07-25 MED ORDER — MAGNESIUM SULFATE 2 GM/50ML IV SOLN
2.0000 g | Freq: Once | INTRAVENOUS | Status: AC
  Administered 2023-07-25: 2 g via INTRAVENOUS
  Filled 2023-07-25: qty 50

## 2023-07-25 MED ORDER — POTASSIUM PHOSPHATES 15 MMOLE/5ML IV SOLN
15.0000 mmol | Freq: Once | INTRAVENOUS | Status: AC
  Administered 2023-07-25: 15 mmol via INTRAVENOUS
  Filled 2023-07-25: qty 5

## 2023-07-25 MED ORDER — POTASSIUM CHLORIDE 10 MEQ/100ML IV SOLN: 10.0000 meq | INTRAVENOUS | Status: DC

## 2023-07-25 MED ORDER — SODIUM CHLORIDE 0.9 % IV SOLN: INTRAVENOUS | Status: DC

## 2023-07-25 MED ORDER — PEG 3350-KCL-NA BICARB-NACL 420 G PO SOLR
3000.0000 mL | Freq: Once | ORAL | Status: AC
  Administered 2023-07-25: 3000 mL via ORAL

## 2023-07-25 MED ORDER — PEG 3350-KCL-NA BICARB-NACL 420 G PO SOLR: 4000.0000 mL | Freq: Once | ORAL | Status: DC

## 2023-07-25 MED ORDER — BISACODYL 10 MG RE SUPP
10.0000 mg | Freq: Once | RECTAL | Status: AC
  Administered 2023-07-25: 10 mg via RECTAL
  Filled 2023-07-25: qty 1

## 2023-07-25 MED ORDER — PEG 3350-KCL-NA BICARB-NACL 420 G PO SOLR
1000.0000 mL | Freq: Once | ORAL | Status: AC
  Administered 2023-07-26: 1000 mL via ORAL

## 2023-07-25 NOTE — Progress Notes (Signed)
Progress Note   Patient: Philip Humphrey:[id number] DOB: May 19, 1961 DOA: 07/23/2023     2 DOS: the patient was seen and examined on 07/25/2023   Brief hospital course: Philip Humphrey is a 62 y.o. M with hypothyroidism, hemochromatosis, chronic alcohol use, B12 deficiency, and thrombocytopenia who presented with dizziness.  In the ER, found to have BP 100, K 2.5, mag 1.6, Na 132, phos 1.1 and elevated bilirubin.  Hgb also dropped to 6.6 with fluid repletion. He got a unit of blood, electrolytes replaced with IV hydration. GI consulted who advised EGD/ colonoscopy on Monday.  Assessment and Plan: Dizziness due to hypokalemia, dehydration, hypophosphatemia, hyponatremia and anemia Symptoms possibly due to  several electrolyte derangements and anemia, alcohol abuse. CTH unremarkable, Echo with normal EF, normal valves, clinically insignificant septal asymmetry. Continue to replete electrolytes. Gentle IV hydration. GI work up for anemia.   Macrocytic anemia History of vitamin B12 deficiency Folate deficiency Continue Folate and B12 supplementation. Transfuse for Hb less than 7. GI consult appreciated, EGD/ colonoscopy plan for Monday. Clear liquid diet.   Hypertension BP lower side.  Hold lisinopril, and olmesartan. Resume midodrine Continue IV fluids   Hyponatremia Hold sertraline. Continue IV hydration. Sodium improved.  Hypokalemia Hypomagnesemia Hypophosphatemia- Ordered IV replacements. Will continue to follow electrolytes as replete as needed.   Hypothyoridism Continue levothyroxine   Alcohol use disorder Did discuss ill effects of alcohol. Advised to quit alcohol. Watch for withdrawal symptoms. Continue thiamine, folate. Fall precautions.   Thrombocytopenia Slight dilution, overall stable at above 90. No active bleeding.   Hereditary Hemochromatosis Outpatient follow up   Transaminitis Due to alcohol use, possibly cirrhosis. Hold statin.   Peripheral  neuropathy Hold gabapentin   Hyperlipidemia Hold Lipitor due to elevated LFT.      Subjective: Patient is seen and examined today morning. He is upset that he did not get food. Wishes to go home if he does not get food. Encouraged to stay for GI work up. RN at bedside. He is at high risk for fall.  Physical Exam: Vitals:   07/24/23 1939 07/25/23 0000 07/25/23 0456 07/25/23 0606  BP: (!) 98/59 103/62 103/72   Pulse: 84 88 92   Resp: 18  18   Temp: 97.8 F (36.6 C) 98 F (36.7 C) 97.7 F (36.5 C)   TempSrc: Oral Oral Oral   SpO2: 100% 98% 100%   Weight:    83.5 kg  Height:       General - Elderly Caucasian male, no apparent distress HEENT - PERRLA, EOMI, atraumatic head, non tender sinuses. Lung - Clear, bibasal rales Heart - S1, S2 heard, no murmurs, rubs, trace pedal edema Neuro - Alert, awake and oriented x 3, non focal exam. Skin - Warm and dry. Data Reviewed:     Latest Ref Rng & Units 07/25/2023    7:56 AM 07/24/2023    5:53 PM 07/24/2023    4:58 AM  CBC  WBC 4.0 - 10.5 K/uL 9.1   13.1   Hemoglobin 13.0 - 17.0 g/dL 8.4  8.6  6.6   Hematocrit 39.0 - 52.0 % 24.3  24.6  19.7   Platelets 150 - 400 K/uL 92   93       Latest Ref Rng & Units 07/25/2023    7:56 AM 07/24/2023    5:53 PM 07/24/2023    4:58 AM  BMP  Glucose 70 - 99 mg/dL 147  829  562   BUN 8 - 23 mg/dL 6  8  9   Creatinine 0.61 - 1.24 mg/dL 5.57  3.22  0.25   Sodium 135 - 145 mmol/L 132  129  131   Potassium 3.5 - 5.1 mmol/L 3.0  3.3  2.9   Chloride 98 - 111 mmol/L 95  93  95   CO2 22 - 32 mmol/L 28  27  26    Calcium 8.9 - 10.3 mg/dL 8.3  8.0  7.5    ECHOCARDIOGRAM COMPLETE  Result Date: 07/24/2023    ECHOCARDIOGRAM REPORT   Patient Name:   Philip Humphrey Date of Exam: 07/24/2023 Medical Rec #:  [id number]          Height:       69.5 in Accession #:    [id number]         Weight:       190.0 lb Date of Birth:  03-17-1961          BSA:          2.031 m Patient Age:    62 years           BP:           90/51  mmHg Patient Gender: M                  HR:           80 bpm. Exam Location:  Inpatient Procedure: 2D Echo, Color Doppler and Cardiac Doppler Indications:    Syncope  History:        Patient has no prior history of Echocardiogram examinations.                 Signs/Symptoms:Syncope; Risk Factors:Hypertension, Dyslipidemia,                 alcohol abuse and Former Smoker. Hx hemochromatosis.  Sonographer:    Philip Humphrey Referring Phys: [id number] SUBRINA SUNDIL IMPRESSIONS  1. Left ventricular ejection fraction, by estimation, is 65 to 70%. The left ventricle has normal function. The left ventricle has no regional wall motion abnormalities. There is moderate asymmetric left ventricular hypertrophy of the basal and septal segments. Left ventricular diastolic parameters were normal.  2. Right ventricular systolic function is normal. The right ventricular size is normal.  3. The mitral valve is normal in structure. No evidence of mitral valve regurgitation. No evidence of mitral stenosis.  4. The aortic valve is tricuspid. Aortic valve regurgitation is not visualized. No aortic stenosis is present. Comparison(s): No prior Echocardiogram. FINDINGS  Left Ventricle: Left ventricular ejection fraction, by estimation, is 65 to 70%. The left ventricle has normal function. The left ventricle has no regional wall motion abnormalities. The left ventricular internal cavity size was normal in size. There is  moderate asymmetric left ventricular hypertrophy of the basal and septal segments. Left ventricular diastolic parameters were normal. Right Ventricle: The right ventricular size is normal. No increase in right ventricular wall thickness. Right ventricular systolic function is normal. Left Atrium: Left atrial size was normal in size. Right Atrium: Right atrial size was normal in size. Pericardium: There is no evidence of pericardial effusion. Mitral Valve: The mitral valve is normal in structure. No evidence of mitral valve  regurgitation. No evidence of mitral valve stenosis. MV peak gradient, 2.8 mmHg. The mean mitral valve gradient is 1.0 mmHg. Tricuspid Valve: The tricuspid valve is normal in structure. Tricuspid valve regurgitation is trivial. No evidence of tricuspid stenosis. Aortic Valve: The aortic valve is tricuspid. Aortic valve regurgitation  is not visualized. No aortic stenosis is present. Aortic valve mean gradient measures 6.5 mmHg. Aortic valve peak gradient measures 9.5 mmHg. Aortic valve area, by VTI measures 2.12 cm. Pulmonic Valve: The pulmonic valve was grossly normal. Pulmonic valve regurgitation is not visualized. No evidence of pulmonic stenosis. Aorta: The aortic root and ascending aorta are structurally normal, with no evidence of dilitation. Venous: The inferior vena cava was not well visualized. IAS/Shunts: The interatrial septum was not well visualized.  LEFT VENTRICLE PLAX 2D LVIDd:         3.70 cm     Diastology LVIDs:         2.50 cm     LV e' medial:    8.05 cm/s LV PW:         1.00 cm     LV E/e' medial:  11.1 LV IVS:        1.40 cm     LV e' lateral:   10.20 cm/s LVOT diam:     1.90 cm     LV E/e' lateral: 8.8 LV SV:         65 LV SV Index:   32 LVOT Area:     2.84 cm  LV Volumes (MOD) LV vol d, MOD A2C: 82.0 ml LV vol d, MOD A4C: 83.3 ml LV vol s, MOD A2C: 26.2 ml LV vol s, MOD A4C: 27.6 ml LV SV MOD A2C:     55.8 ml LV SV MOD A4C:     83.3 ml LV SV MOD BP:      56.0 ml RIGHT VENTRICLE RV Basal diam:  3.30 cm RV S prime:     14.20 cm/s TAPSE (M-mode): 2.0 cm LEFT ATRIUM             Index        RIGHT ATRIUM           Index LA diam:        3.30 cm 1.62 cm/m   RA Area:     13.00 cm LA Vol (A2C):   36.9 ml 18.16 ml/m  RA Volume:   28.00 ml  13.78 ml/m LA Vol (A4C):   43.4 ml 21.36 ml/m LA Biplane Vol: 40.9 ml 20.13 ml/m  AORTIC VALVE AV Area (Vmax):    2.04 cm AV Area (Vmean):   1.97 cm AV Area (VTI):     2.12 cm AV Vmax:           154.50 cm/s AV Vmean:          119.000 cm/s AV VTI:             0.308 m AV Peak Grad:      9.5 mmHg AV Mean Grad:      6.5 mmHg LVOT Vmax:         111.00 cm/s LVOT Vmean:        82.600 cm/s LVOT VTI:          0.230 m LVOT/AV VTI ratio: 0.75  AORTA Ao Root diam: 3.30 cm Ao Asc diam:  3.70 cm MITRAL VALVE               TRICUSPID VALVE MV Area (PHT): 4.15 cm    TR Peak grad:   18.7 mmHg MV Area VTI:   3.32 cm    TR Vmax:        216.00 cm/s MV Peak grad:  2.8 mmHg MV Mean grad:  1.0 mmHg    SHUNTS MV  Vmax:       0.83 m/s    Systemic VTI:  0.23 m MV Vmean:      56.3 cm/s   Systemic Diam: 1.90 cm MV Decel Time: 183 msec MV E velocity: 89.30 cm/s MV A velocity: 84.90 cm/s MV E/A ratio:  1.05 Vishnu Priya Mallipeddi Electronically signed by Winfield Rast Mallipeddi Signature Date/Time: 07/24/2023/12:11:18 PM    Final    CT ABDOMEN PELVIS W CONTRAST  Result Date: 07/23/2023 CLINICAL DATA:  Abdominal pain, acute, nonlocalized EXAM: CT ABDOMEN AND PELVIS WITH CONTRAST TECHNIQUE: Multidetector CT imaging of the abdomen and pelvis was performed using the standard protocol following bolus administration of intravenous contrast. RADIATION DOSE REDUCTION: This exam was performed according to the departmental dose-optimization program which includes automated exposure control, adjustment of the mA and/or kV according to patient size and/or use of iterative reconstruction technique. CONTRAST:  OMNIPAQUE IOHEXOL 300 MG/ML  SOLN COMPARISON:  07/07/2021 FINDINGS: Lower chest: No acute abnormality Hepatobiliary: Diffuse low-density throughout the liver compatible with fatty infiltration. No focal abnormality. Gallbladder unremarkable. Pancreas: No focal abnormality or ductal dilatation. Spleen: No focal abnormality.  Normal size. Adrenals/Urinary Tract: Adrenal glands are normal. Small bilateral renal cysts, the largest 1.8 cm in the midpole of the left kidney, unchanged. No follow-up imaging recommended. No stones or hydronephrosis. Urinary bladder obscured by beam hardening artifact from  bilateral hip replacements. Stomach/Bowel: Stomach, large and small bowel grossly unremarkable. Vascular/Lymphatic: Aortic atherosclerosis. No evidence of aneurysm or adenopathy. Reproductive: Obscured by beam hardening artifact from bilateral hip replacements. Other: No free fluid or free air. Musculoskeletal: Bilateral hip replacements. No acute bony abnormality. IMPRESSION: Hepatic steatosis. No acute findings. Aortic atherosclerosis. Electronically Signed   By: Charlett Nose M.D.   On: 07/23/2023 18:28   CT Head Wo Contrast  Result Date: 07/23/2023 CLINICAL DATA:  Head trauma, abnormal mental status (Age 22-64y). Dizziness, fall. EXAM: CT HEAD WITHOUT CONTRAST TECHNIQUE: Contiguous axial images were obtained from the base of the skull through the vertex without intravenous contrast. RADIATION DOSE REDUCTION: This exam was performed according to the departmental dose-optimization program which includes automated exposure control, adjustment of the mA and/or kV according to patient size and/or use of iterative reconstruction technique. COMPARISON:  09/26/2022 FINDINGS: Brain: No acute intracranial abnormality. Specifically, no hemorrhage, hydrocephalus, mass lesion, acute infarction, or significant intracranial injury. Vascular: No hyperdense vessel or unexpected calcification. Skull: No acute calvarial abnormality. Sinuses/Orbits: No acute findings Other: None IMPRESSION: No acute intracranial abnormality. Electronically Signed   By: Charlett Nose M.D.   On: 07/23/2023 18:25   DG Pelvis Portable  Result Date: 07/23/2023 CLINICAL DATA:  Shortness of breath.  Occasional falls. EXAM: PORTABLE PELVIS 2 VIEWS COMPARISON:  None Available. FINDINGS: Bilateral hip arthroplasty identified with screw fixated acetabular cups and Press-Fit femoral components. Mild degenerative changes of the right sacroiliac joint. No acute fracture or dislocation. IMPRESSION: Bilateral hip arthroplasty. Degenerative changes of the right  sacroiliac joint. Electronically Signed   By: Karen Kays M.D.   On: 07/23/2023 17:16   DG Chest Port 1 View  Result Date: 07/23/2023 CLINICAL DATA:  Shortness of breath EXAM: PORTABLE CHEST 1 VIEW COMPARISON:  04/12/2022 FINDINGS: Underinflation. No consolidation, pneumothorax or effusion. No edema. Normal cardiopericardial silhouette. Overlapping cardiac leads. Truncated appearance of the distal right clavicle. Chronic. IMPRESSION: Underinflation.  No acute cardiopulmonary disease. Electronically Signed   By: Karen Kays M.D.   On: 07/23/2023 17:15     Family Communication: Patient understands the above care  plan.  Disposition: Status is: Inpatient Remains inpatient appropriate because: severe electrolyte abnormalities. GI work up.  Planned Discharge Destination: Home    Time spent: 44 minutes  Author: Marcelino Duster, MD 07/25/2023 10:46 AM  For on call review [email or web address].

## 2023-07-25 NOTE — Plan of Care (Signed)
No acute event overnight. Pt NPO since midnight.    Problem: Education: Goal: Knowledge of General Education information will improve Description: Including pain rating scale, medication(s)/side effects and non-pharmacologic comfort measures Outcome: Progressing   Problem: Health Behavior/Discharge Planning: Goal: Ability to manage health-related needs will improve Outcome: Progressing   Problem: Clinical Measurements: Goal: Ability to maintain clinical measurements within normal limits will improve Outcome: Progressing Goal: Will remain free from infection Outcome: Progressing Goal: Diagnostic test results will improve Outcome: Progressing Goal: Respiratory complications will improve Outcome: Progressing Goal: Cardiovascular complication will be avoided Outcome: Progressing   Problem: Activity: Goal: Risk for activity intolerance will decrease Outcome: Progressing

## 2023-07-25 NOTE — Plan of Care (Signed)

## 2023-07-25 NOTE — Progress Notes (Signed)
Subjective: No abdominal pain. No blood in stool or hematemesis.  Objective: Vital signs in last 24 hours: Temp:  [97.5 F (36.4 C)-98 F (36.7 C)] 97.7 F (36.5 C) (08/04 0456) Pulse Rate:  [81-92] 92 (08/04 0456) Resp:  [16-18] 18 (08/04 0456) BP: (98-112)/(59-72) 103/72 (08/04 0456) SpO2:  [98 %-100 %] 100 % (08/04 0456) Weight:  [83.5 kg] 83.5 kg (08/04 0606) Weight change: -3.5 kg Last BM Date : 07/22/23  PE: GEN:  NAD, older-appearing than stated age SKIN:  Scattered ecchymoses NEURO:  No encephalopathy ABD:  Soft, non-tender  Lab Results: CBC    Component Value Date/Time   WBC 9.1 07/25/2023 0756   RBC 2.33 (L) 07/25/2023 0756   HGB 8.4 (L) 07/25/2023 0756   HGB 13.8 08/26/2022 1520   HCT 24.3 (L) 07/25/2023 0756   PLT 92 (L) 07/25/2023 0756   PLT 156 08/26/2022 1520   MCV 104.3 (H) 07/25/2023 0756   MCV 100.9 (A) 07/13/2013 1256   MCH 36.1 (H) 07/25/2023 0756   MCHC 34.6 07/25/2023 0756   RDW 20.5 (H) 07/25/2023 0756   LYMPHSABS 0.9 07/23/2023 1635   MONOABS 0.9 07/23/2023 1635   EOSABS 0.9 (H) 07/23/2023 1635   BASOSABS 0.1 07/23/2023 1635  CMP     Component Value Date/Time   NA 132 (L) 07/25/2023 0756   K 3.0 (L) 07/25/2023 0756   CL 95 (L) 07/25/2023 0756   CO2 28 07/25/2023 0756   GLUCOSE 118 (H) 07/25/2023 0756   BUN 6 (L) 07/25/2023 0756   CREATININE 0.55 (L) 07/25/2023 0756   CREATININE 0.79 08/26/2022 1520   CREATININE 1.22 07/13/2013 1253   CALCIUM 8.3 (L) 07/25/2023 0756   PROT 5.4 (L) 07/25/2023 0756   ALBUMIN 2.9 (L) 07/25/2023 0756   AST 44 (H) 07/25/2023 0756   AST 47 (H) 08/26/2022 1520   ALT 19 07/25/2023 0756   ALT 23 08/26/2022 1520   ALKPHOS 115 07/25/2023 0756   BILITOT 1.6 (H) 07/25/2023 0756   BILITOT 0.7 08/26/2022 1520   GFRNONAA >60 07/25/2023 0756   GFRNONAA >60 08/26/2022 1520   Assessment:  Dizziness, weakness, syncope. Anemia.  Macrocytic.  No overt GI bleeding.  Not convinced GI etiology, but can't rule out  at this time. History B12 deficiency (levels currently ok) and folate deficiency (levels currently low). Hereditary hemochromatosis. Chronic alcohol use.  Plan:   Endoscopy and colonoscopy tomorrow ~ 2 pm with Dr. Levora Angel, to help more comfortably exclude any potential GI-mediated causes of patient's anemia. Clear liquid diet today. Next step in management pending endoscopy and colonoscopy findings. [facility] will follow.   Philip Humphrey 07/25/2023, 1:43 PM   Cell [phone] If no answer or after 5 PM call [phone]

## 2023-07-25 NOTE — Progress Notes (Signed)
Physical Therapy Treatment Patient Details Name: Philip Humphrey MRN: [id number] DOB: [birth year] Today's Date: 07/25/2023   History of Present Illness Pt is a 62yo male presenting to [facility] ED on 8/2 dizziness with syncope, found to be anemic, received 1 unit PRBC. CT head negative for acute abnormality.  PMH: anxiety & depression, GERD, HTN, neuropathy, R-RCR, L-RCR, L-THA, R-THA    PT Comments  Pt admitted for "dizziness due to hypokalemia, dehydration, hypophosphatemia, hyponatremia and anemia - symptoms possibly due to several electrolyte derangements and anemia, alcohol abuse".  Vestibular evaluation initiated yesterday.  Able to perform positional testing to rule out BPPV today.  Performed Left and Right Dix Hallpike and horizontal canal testing which were negative for nystagmus.  Pt reports "blurry vision" with right sided testing but no dizziness or spinning.  Pt reported dizziness/spinning with standing and ambulating which limited ambulation distance.  Pt anticipates endoscopy and colonoscopy tomorrow and states he is hoping they find potential cause for his anemia.  If dizziness continues upon this admission and d/c, recommend f/u with Neuro OPPT for dizziness and recurrent falls.  Pt reports he has a RW at home.    If plan is discharge home, recommend the following: Assist for transportation;Help with stairs or ramp for entrance;A little help with bathing/dressing/bathroom;A little help with walking and/or transfers   Can travel by private vehicle        Equipment Recommendations  None recommended by PT    Recommendations for Other Services       Precautions / Restrictions Precautions Precautions: Fall     Mobility  Bed Mobility Overal bed mobility: Modified Independent             General bed mobility comments: increased time due to rib pain    Transfers Overall transfer level: Needs assistance Equipment used: Rolling walker (2 wheels) Transfers: Sit to/from  Stand Sit to Stand: Min guard           General transfer comment: contact guard for safety; increased time and effort from regular height (pt declined raising bed)    Ambulation/Gait Ambulation/Gait assistance: Min guard Gait Distance (Feet): 15 Feet Assistive device: Rolling walker (2 wheels) Gait Pattern/deviations: Step-through pattern, Decreased stride length, Trunk flexed Gait velocity: decreased     General Gait Details: pt self limited distance due to increasing "dizziness", contact guard for safety however no physical assist required   Stairs             Wheelchair Mobility     Tilt Bed    Modified Rankin (Stroke Patients Only)       Balance Overall balance assessment: History of Falls                                          Cognition Arousal/Alertness: Awake/alert Behavior During Therapy: WFL for tasks assessed/performed, Flat affect Overall Cognitive Status: Within Functional Limits for tasks assessed                                          Exercises      General Comments        Pertinent Vitals/Pain Pain Assessment Pain Assessment: 0-10 Pain Score: 5  Pain Location: Lower ribs, L>R but bilaterally (reports nonacute rib pain) Pain Descriptors / Indicators: Grimacing, Tender  Pain Intervention(s): Monitored during session, Repositioned    Home Living                          Prior Function            PT Goals (current goals can now be found in the care plan section) Progress towards PT goals: Progressing toward goals    Frequency    Min 1X/week      PT Plan Discharge plan needs to be updated    Co-evaluation              AM-PAC PT "6 Clicks" Mobility   Outcome Measure  Help needed turning from your back to your side while in a flat bed without using bedrails?: A Little Help needed moving from lying on your back to sitting on the side of a flat bed without using  bedrails?: A Little Help needed moving to and from a bed to a chair (including a wheelchair)?: A Little Help needed standing up from a chair using your arms (e.g., wheelchair or bedside chair)?: A Little Help needed to walk in hospital room?: A Little Help needed climbing 3-5 steps with a railing? : A Lot 6 Click Score: 17    End of Session Equipment Utilized During Treatment: Gait belt Activity Tolerance: Patient tolerated treatment well Patient left: in bed;with call bell/phone within reach;with bed alarm set   PT Visit Diagnosis: Difficulty in walking, not elsewhere classified (R26.2)     Time: 1220-1250 PT Time Calculation (min) (ACUTE ONLY): 30 min  Charges:    $Gait Training: 8-22 mins $Physical Performance Test: 8-22 mins PT General Charges $$ ACUTE PT VISIT: 1 Visit                    Philip Humphrey, DPT Physical Therapist Acute Rehabilitation Services Office: [phone]    Philip Humphrey 07/25/2023, 3:46 PM

## 2023-07-26 DIAGNOSIS — F1090 Alcohol use, unspecified, uncomplicated: Secondary | ICD-10-CM | POA: Diagnosis not present

## 2023-07-26 DIAGNOSIS — E876 Hypokalemia: Secondary | ICD-10-CM | POA: Diagnosis not present

## 2023-07-26 DIAGNOSIS — R42 Dizziness and giddiness: Secondary | ICD-10-CM | POA: Diagnosis not present

## 2023-07-26 DIAGNOSIS — I10 Essential (primary) hypertension: Secondary | ICD-10-CM

## 2023-07-26 DIAGNOSIS — D649 Anemia, unspecified: Secondary | ICD-10-CM | POA: Diagnosis not present

## 2023-07-26 NOTE — Progress Notes (Signed)
   07/26/23 1250  TOC Brief Assessment  Insurance and Status Reviewed  Patient has primary care physician Yes  Home environment has been reviewed Home  Prior level of function: Independent  Prior/Current Home Services No current home services  Social Determinants of Health Reivew SDOH reviewed no interventions necessary  Readmission risk has been reviewed Yes  Transition of care needs no transition of care needs at this time

## 2023-07-26 NOTE — Discharge Summary (Signed)
Physician AMA Discharge Summary   Patient: Philip Humphrey MRN: [id number] DOB: [birth year]  Admit date:     07/23/2023  Discharge date: 07/26/23  Discharge Physician: Marcelino Duster   PCP: Courtney Paris, NP   Recommendations at discharge:    Patient signed out AMA, understands to follow up with PCP.  Discharge Diagnoses: Principal Problem:   Postural dizziness with near syncope Active Problems:   Hypokalemia   Dizziness   Acute on chronic anemia   Hypothyroidism   Essential hypertension   Hereditary hemochromatosis (HCC)   Chronic alcohol use   Hyperlipidemia   Peripheral neuropathy   Leukocytosis   Chronic idiopathic thrombocytopenia (HCC)   Hepatic steatosis   Vitamin B12 deficiency   GERD (gastroesophageal reflux disease)   Hypotensive episode   Hypophosphatemia   Hypomagnesemia   Postural dizziness  Resolved Problems:   * No resolved hospital problems. [facility] Course: Philip Humphrey is a 62 y.o. M with hypothyroidism, hemochromatosis, chronic alcohol use, B12 deficiency, and thrombocytopenia who presented with dizziness.   In the ER, found to have BP 100, K 2.5, mag 1.6, Na 132, phos 1.1 and elevated bilirubin.  Hgb also dropped to 6.6 with fluid repletion. He got a unit of blood, electrolytes replaced with IV hydration admitted to hospitalist service for further management and evaluation. His electrolytes closely monitored and repleted accordingly. GI consulted who advised EGD/ colonoscopy. He refused to stay and wishes to sign out. I discussed with him the risks of leaving against medical advice. He is alert, awake, in right mind do understand the risks, agrees to follow with PCP, call 911 for severe rectal bleeding, and or unusual symptoms. RN also counseled him, did not want to stay, left AMA.   Assessment and Plan: Dizziness due to electrolyte abnormalities and anemia Symptoms possibly due to  several electrolyte derangements and anemia, alcohol abuse.  CTH unremarkable, Echo with normal EF, normal valves, clinically insignificant septal asymmetry. GI work up for anemia ongoing. He did not want to stay left AMA.   Macrocytic anemia History of vitamin B12 deficiency Folate deficiency Continue Folate and B12 supplementation. GI consult appreciated, EGD/ colonoscopy scheduled for tomorrow. He did not want to stay left AMA.   Hypertension BP lower side.  Held lisinopril, and olmesartan. Resumed midodrine  Hyponatremia Hold sertraline. Sodium improved.   Hypokalemia Hypomagnesemia Hypophosphatemia- S/p IV and oral replacements. Need monitoring, but he did not want to stay left AMA.   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 Advised to quit alcohol. Continue thiamine, folate.   Thrombocytopenia Slight dilution, overall stable at above 90. No active bleeding.   Hereditary Hemochromatosis Outpatient follow up suggested.   Transaminitis Due to alcohol use, possibly cirrhosis. Held statin.   Peripheral neuropathy Held gabapentin   Hyperlipidemia Held Lipitor due to elevated LFT.  He understands to follow up with his PCP, GI upon discharge.      Consultants: GI Procedures performed: none  Disposition:  signed out AMA Diet recommendation:  Cardiac diet DISCHARGE MEDICATION: Patient signed out AMA. NO NEW MEDICATIONS PRESCRIBED.   Discharge Exam: Filed Weights   07/23/23 1557 07/23/23 1605 07/25/23 0606  Weight: 87 kg 86.2 kg 83.5 kg   General - Elderly Caucasian male, no apparent distress HEENT - PERRLA, EOMI, atraumatic head, non tender sinuses. Lung - Clear, bibasal rales Heart - S1, S2 heard, no murmurs, rubs, trace pedal edema Neuro - Alert, awake and oriented x 3, non focal  exam. Skin - Warm and dry.  Condition at discharge: fair  The results of significant diagnostics from this hospitalization (including imaging, microbiology, ancillary and  laboratory) are listed below for reference.   Imaging Studies: ECHOCARDIOGRAM COMPLETE  Result Date: 07/24/2023    ECHOCARDIOGRAM REPORT   Patient Name:   Philip Humphrey Date of Exam: 07/24/2023 Medical Rec #:  [id number]          Height:       69.5 in Accession #:    [id number]         Weight:       190.0 lb Date of Birth:  11/15/1961          BSA:          2.031 m Patient Age:    62 years           BP:           90/51 mmHg Patient Gender: M                  HR:           80 bpm. Exam Location:  Inpatient Procedure: 2D Echo, Color Doppler and Cardiac Doppler Indications:    Syncope  History:        Patient has no prior history of Echocardiogram examinations.                 Signs/Symptoms:Syncope; Risk Factors:Hypertension, Dyslipidemia,                 alcohol abuse and Former Smoker. Hx hemochromatosis.  Sonographer:    Wallie Char Referring Phys: [id number] SUBRINA SUNDIL IMPRESSIONS  1. Left ventricular ejection fraction, by estimation, is 65 to 70%. The left ventricle has normal function. The left ventricle has no regional wall motion abnormalities. There is moderate asymmetric left ventricular hypertrophy of the basal and septal segments. Left ventricular diastolic parameters were normal.  2. Right ventricular systolic function is normal. The right ventricular size is normal.  3. The mitral valve is normal in structure. No evidence of mitral valve regurgitation. No evidence of mitral stenosis.  4. The aortic valve is tricuspid. Aortic valve regurgitation is not visualized. No aortic stenosis is present. Comparison(s): No prior Echocardiogram. FINDINGS  Left Ventricle: Left ventricular ejection fraction, by estimation, is 65 to 70%. The left ventricle has normal function. The left ventricle has no regional wall motion abnormalities. The left ventricular internal cavity size was normal in size. There is  moderate asymmetric left ventricular hypertrophy of the basal and septal segments. Left ventricular  diastolic parameters were normal. Right Ventricle: The right ventricular size is normal. No increase in right ventricular wall thickness. Right ventricular systolic function is normal. Left Atrium: Left atrial size was normal in size. Right Atrium: Right atrial size was normal in size. Pericardium: There is no evidence of pericardial effusion. Mitral Valve: The mitral valve is normal in structure. No evidence of mitral valve regurgitation. No evidence of mitral valve stenosis. MV peak gradient, 2.8 mmHg. The mean mitral valve gradient is 1.0 mmHg. Tricuspid Valve: The tricuspid valve is normal in structure. Tricuspid valve regurgitation is trivial. No evidence of tricuspid stenosis. Aortic Valve: The aortic valve is tricuspid. Aortic valve regurgitation is not visualized. No aortic stenosis is present. Aortic valve mean gradient measures 6.5 mmHg. Aortic valve peak gradient measures 9.5 mmHg. Aortic valve area, by VTI measures 2.12 cm. Pulmonic Valve: The pulmonic valve was grossly normal. Pulmonic valve regurgitation is not  visualized. No evidence of pulmonic stenosis. Aorta: The aortic root and ascending aorta are structurally normal, with no evidence of dilitation. Venous: The inferior vena cava was not well visualized. IAS/Shunts: The interatrial septum was not well visualized.  LEFT VENTRICLE PLAX 2D LVIDd:         3.70 cm     Diastology LVIDs:         2.50 cm     LV e' medial:    8.05 cm/s LV PW:         1.00 cm     LV E/e' medial:  11.1 LV IVS:        1.40 cm     LV e' lateral:   10.20 cm/s LVOT diam:     1.90 cm     LV E/e' lateral: 8.8 LV SV:         65 LV SV Index:   32 LVOT Area:     2.84 cm  LV Volumes (MOD) LV vol d, MOD A2C: 82.0 ml LV vol d, MOD A4C: 83.3 ml LV vol s, MOD A2C: 26.2 ml LV vol s, MOD A4C: 27.6 ml LV SV MOD A2C:     55.8 ml LV SV MOD A4C:     83.3 ml LV SV MOD BP:      56.0 ml RIGHT VENTRICLE RV Basal diam:  3.30 cm RV S prime:     14.20 cm/s TAPSE (M-mode): 2.0 cm LEFT ATRIUM              Index        RIGHT ATRIUM           Index LA diam:        3.30 cm 1.62 cm/m   RA Area:     13.00 cm LA Vol (A2C):   36.9 ml 18.16 ml/m  RA Volume:   28.00 ml  13.78 ml/m LA Vol (A4C):   43.4 ml 21.36 ml/m LA Biplane Vol: 40.9 ml 20.13 ml/m  AORTIC VALVE AV Area (Vmax):    2.04 cm AV Area (Vmean):   1.97 cm AV Area (VTI):     2.12 cm AV Vmax:           154.50 cm/s AV Vmean:          119.000 cm/s AV VTI:            0.308 m AV Peak Grad:      9.5 mmHg AV Mean Grad:      6.5 mmHg LVOT Vmax:         111.00 cm/s LVOT Vmean:        82.600 cm/s LVOT VTI:          0.230 m LVOT/AV VTI ratio: 0.75  AORTA Ao Root diam: 3.30 cm Ao Asc diam:  3.70 cm MITRAL VALVE               TRICUSPID VALVE MV Area (PHT): 4.15 cm    TR Peak grad:   18.7 mmHg MV Area VTI:   3.32 cm    TR Vmax:        216.00 cm/s MV Peak grad:  2.8 mmHg MV Mean grad:  1.0 mmHg    SHUNTS MV Vmax:       0.83 m/s    Systemic VTI:  0.23 m MV Vmean:      56.3 cm/s   Systemic Diam: 1.90 cm MV Decel Time: 183 msec MV E velocity: 89.30 cm/s MV  A velocity: 84.90 cm/s MV E/A ratio:  1.05 Vishnu Priya Mallipeddi Electronically signed by Winfield Rast Mallipeddi Signature Date/Time: 07/24/2023/12:11:18 PM    Final    CT ABDOMEN PELVIS W CONTRAST  Result Date: 07/23/2023 CLINICAL DATA:  Abdominal pain, acute, nonlocalized EXAM: CT ABDOMEN AND PELVIS WITH CONTRAST TECHNIQUE: Multidetector CT imaging of the abdomen and pelvis was performed using the standard protocol following bolus administration of intravenous contrast. RADIATION DOSE REDUCTION: This exam was performed according to the departmental dose-optimization program which includes automated exposure control, adjustment of the mA and/or kV according to patient size and/or use of iterative reconstruction technique. CONTRAST:  OMNIPAQUE IOHEXOL 300 MG/ML  SOLN COMPARISON:  07/07/2021 FINDINGS: Lower chest: No acute abnormality Hepatobiliary: Diffuse low-density throughout the liver compatible with  fatty infiltration. No focal abnormality. Gallbladder unremarkable. Pancreas: No focal abnormality or ductal dilatation. Spleen: No focal abnormality.  Normal size. Adrenals/Urinary Tract: Adrenal glands are normal. Small bilateral renal cysts, the largest 1.8 cm in the midpole of the left kidney, unchanged. No follow-up imaging recommended. No stones or hydronephrosis. Urinary bladder obscured by beam hardening artifact from bilateral hip replacements. Stomach/Bowel: Stomach, large and small bowel grossly unremarkable. Vascular/Lymphatic: Aortic atherosclerosis. No evidence of aneurysm or adenopathy. Reproductive: Obscured by beam hardening artifact from bilateral hip replacements. Other: No free fluid or free air. Musculoskeletal: Bilateral hip replacements. No acute bony abnormality. IMPRESSION: Hepatic steatosis. No acute findings. Aortic atherosclerosis. Electronically Signed   By: Charlett Nose M.D.   On: 07/23/2023 18:28   CT Head Wo Contrast  Result Date: 07/23/2023 CLINICAL DATA:  Head trauma, abnormal mental status (Age 54-64y). Dizziness, fall. EXAM: CT HEAD WITHOUT CONTRAST TECHNIQUE: Contiguous axial images were obtained from the base of the skull through the vertex without intravenous contrast. RADIATION DOSE REDUCTION: This exam was performed according to the departmental dose-optimization program which includes automated exposure control, adjustment of the mA and/or kV according to patient size and/or use of iterative reconstruction technique. COMPARISON:  09/26/2022 FINDINGS: Brain: No acute intracranial abnormality. Specifically, no hemorrhage, hydrocephalus, mass lesion, acute infarction, or significant intracranial injury. Vascular: No hyperdense vessel or unexpected calcification. Skull: No acute calvarial abnormality. Sinuses/Orbits: No acute findings Other: None IMPRESSION: No acute intracranial abnormality. Electronically Signed   By: Charlett Nose M.D.   On: 07/23/2023 18:25   DG Pelvis  Portable  Result Date: 07/23/2023 CLINICAL DATA:  Shortness of breath.  Occasional falls. EXAM: PORTABLE PELVIS 2 VIEWS COMPARISON:  None Available. FINDINGS: Bilateral hip arthroplasty identified with screw fixated acetabular cups and Press-Fit femoral components. Mild degenerative changes of the right sacroiliac joint. No acute fracture or dislocation. 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 Electronically Signed   By: Karen Kays M.D.   On: 07/23/2023 17:16   DG Chest Port 1 View  Result Date: 07/23/2023 CLINICAL DATA:  Shortness of breath EXAM: PORTABLE CHEST 1 VIEW COMPARISON:  04/12/2022 FINDINGS: Underinflation. No consolidation, pneumothorax or effusion. No edema. Normal cardiopericardial silhouette. Overlapping cardiac leads. Truncated appearance of the distal right clavicle. Chronic. IMPRESSION: Underinflation.  No acute cardiopulmonary disease. Electronically Signed   By: Karen Kays M.D.   On: 07/23/2023 17:15    Microbiology: Results for orders placed or performed during the hospital encounter of 09/14/22  Resp Panel by RT-PCR (Flu A&B, Covid) Anterior Nasal Swab     Status: None   Collection Time: 09/14/22  6:50 PM   Specimen: Anterior Nasal Swab  Result Value Ref Range Status  SARS Coronavirus 2 by RT PCR NEGATIVE NEGATIVE Final    Comment: (NOTE) SARS-CoV-2 target nucleic acids are NOT DETECTED.  The SARS-CoV-2 RNA is generally detectable in upper respiratory specimens during the acute phase of infection. The lowest concentration of SARS-CoV-2 viral copies this assay can detect is 138 copies/mL. A negative result does not preclude SARS-Cov-2 infection and should not be used as the sole basis for treatment or other patient management decisions. A negative result may occur with  improper specimen collection/handling, submission of specimen other than nasopharyngeal swab, presence of viral mutation(s) within the areas  targeted by this assay, and inadequate number of viral copies(<138 copies/mL). A negative result must be combined with clinical observations, patient history, and epidemiological information. The expected result is Negative.  Fact Sheet for Patients:  BloggerCourse.com  Fact Sheet for Healthcare Providers:  SeriousBroker.it  This test is no t yet approved or cleared by the [place] FDA and  has been authorized for detection and/or diagnosis of SARS-CoV-2 by FDA under an Emergency Use Authorization (EUA). This EUA will remain  in effect (meaning this test can be used) for the duration of the COVID-19 declaration under Section 564(b)(1) of the Act, 21 U.S.C.section 360bbb-3(b)(1), unless the authorization is terminated  or revoked sooner.       Influenza A by PCR NEGATIVE NEGATIVE Final   Influenza B by PCR NEGATIVE NEGATIVE Final    Comment: (NOTE) The Xpert Xpress SARS-CoV-2/FLU/RSV plus assay is intended as an aid in the diagnosis of influenza from Nasopharyngeal swab specimens and should not be used as a sole basis for treatment. Nasal washings and aspirates are unacceptable for Xpert Xpress SARS-CoV-2/FLU/RSV testing.  Fact Sheet for Patients: BloggerCourse.com  Fact Sheet for Healthcare Providers: SeriousBroker.it  This test is not yet approved or cleared by the [place] FDA and has been authorized for detection and/or diagnosis of SARS-CoV-2 by FDA under an Emergency Use Authorization (EUA). This EUA will remain in effect (meaning this test can be used) for the duration of the COVID-19 declaration under Section 564(b)(1) of the Act, 21 U.S.C. section 360bbb-3(b)(1), unless the authorization is terminated or revoked.  Performed at [facility], [address], [place], [place] [address]     Labs: CBC: Recent Labs  Lab 07/23/23 1635  07/24/23 0458 07/24/23 1753 07/25/23 0756  WBC 17.4* 13.1*  --  9.1  NEUTROABS 14.6*  --   --   --   HGB 8.7* 6.6* 8.6* 8.4*  HCT 25.1* 19.7* 24.6* 24.3*  MCV 110.6* 111.3*  --  104.3*  PLT 123* 93*  --  92*   Basic Metabolic Panel: Recent Labs  Lab 07/23/23 1635 07/23/23 2005 07/24/23 0458 07/24/23 1753 07/25/23 0756 07/26/23 0457  NA 132*  --  131* 129* 132*  --   K 2.5*  --  2.9* 3.3* 3.0*  --   CL 86*  --  95* 93* 95*  --   CO2 24  --  26 27 28   --   GLUCOSE 134*  --  120* 137* 118*  --   BUN 11  --  9 8 6*  --   CREATININE 0.91  --  0.63 0.57* 0.55*  --   CALCIUM 9.0  --  7.5* 8.0* 8.3*  --   MG  --  1.6* 2.0  --  1.6* 1.8  PHOS  --  1.8* 1.1*  --  2.3* 3.2   Liver Function Tests: Recent Labs  Lab 07/23/23  1635 07/25/23 0756  AST 54* 44*  ALT 27 19  ALKPHOS 154* 115  BILITOT 3.4* 1.6*  PROT 7.0 5.4*  ALBUMIN 3.8 2.9*   CBG: Recent Labs  Lab 07/23/23 1612  GLUCAP 138*    Discharge time spent: greater than 30 minutes.  Signed: Marcelino Duster, MD [facility] 07/26/2023

## 2023-07-26 NOTE — Plan of Care (Signed)

## 2023-07-26 NOTE — Progress Notes (Signed)
[facility] Gastroenterology Progress Note  Sandeep Olds Willert 62 y.o. [birth year]  CC: Anemia   Subjective: Patient seen and examined at bedside.  He was scheduled for EGD and colonoscopy today but he did not bring the prep.  Now he is saying that he does not want to have procedures done and wants to go home.  ROS : Negative for chest pain.   Objective: Vital signs in last 24 hours: Vitals:   07/25/23 1932 07/26/23 0405  BP: 102/76 96/65  Pulse: 92 91  Resp:  18  Temp: 97.8 F (36.6 C) 97.8 F (36.6 C)  SpO2: 99% 97%    Physical Exam:  General:  Alert, cooperative, no distress, appears stated age  Head:  Normocephalic, without obvious abnormality, atraumatic  Eyes:  , EOM's intact,   Lungs:   Clear to auscultation bilaterally, respirations unlabored  Heart:  Regular rate and rhythm, S1, S2 normal  Abdomen:   Soft, non-tender, bowel sounds active all four quadrants,  no masses,   Extremities: Extremities normal, atraumatic, no  edema  Pulses: 2+ and symmetric    Lab Results: Recent Labs    07/24/23 1753 07/25/23 0756 07/26/23 0457  NA 129* 132*  --   K 3.3* 3.0*  --   CL 93* 95*  --   CO2 27 28  --   GLUCOSE 137* 118*  --   BUN 8 6*  --   CREATININE 0.57* 0.55*  --   CALCIUM 8.0* 8.3*  --   MG  --  1.6* 1.8  PHOS  --  2.3* 3.2   Recent Labs    07/23/23 1635 07/25/23 0756  AST 54* 44*  ALT 27 19  ALKPHOS 154* 115  BILITOT 3.4* 1.6*  PROT 7.0 5.4*  ALBUMIN 3.8 2.9*   Recent Labs    07/23/23 1635 07/24/23 0458 07/24/23 1753 07/25/23 0756  WBC 17.4* 13.1*  --  9.1  NEUTROABS 14.6*  --   --   --   HGB 8.7* 6.6* 8.6* 8.4*  HCT 25.1* 19.7* 24.6* 24.3*  MCV 110.6* 111.3*  --  104.3*  PLT 123* 93*  --  92*   No results for input(s): "LABPROT", "INR" in the last 72 hours.    Assessment/Plan: -Anemia.  No overt bleeding.  Was scheduled for EGD and colonoscopy today but it did not drink prep.  Hemoglobin was down to 6.6. -History of vitamin B 12  deficiency.  Normal level during this admission. -Elevated MCV.  Probably from chronic alcohol use.  Has folic acid deficiency. -Hereditary hemochromatosis.  Currently anemic but iron saturation is at 90 is elevated ferritin.  Recommendations ------------------------ -Patient is now declining any kind of endoscopy workup at this time.  His CT abdomen pelvis with IV contrast on admission was negative for acute changes. -He is anemia  is likely multifactorial.  Recommend folic acid supplement on discharge. -GI will sign off.  Patient was advised to call our clinic if he wants any further GI workup.  Call us back if needed.   Kathi Der MD, FACP 07/26/2023, 10:39 AM  Contact #  [phone]

## 2023-07-27 SURGERY — ESOPHAGOGASTRODUODENOSCOPY (EGD) WITH PROPOFOL
Anesthesia: Monitor Anesthesia Care | Laterality: Left

## 2023-07-28 LAB — TYPE AND SCREEN
ABO/RH(D): A POS
Antibody Screen: NEGATIVE
Unit division: 0
Unit division: 0

## 2023-07-28 LAB — BPAM RBC
Blood Product Expiration Date: 202408282359
Blood Product Expiration Date: 202408282359
ISSUE DATE / TIME: 202408031039
Unit Type and Rh: 6200
Unit Type and Rh: 6200

## 2023-09-24 ENCOUNTER — Encounter (HOSPITAL_COMMUNITY): Payer: Self-pay | Admitting: Emergency Medicine

## 2023-09-24 ENCOUNTER — Inpatient Hospital Stay (HOSPITAL_COMMUNITY)
Admission: EM | Admit: 2023-09-24 | Discharge: 2023-10-04 | DRG: 812 | Disposition: A | Discharge status: Skilled Nursing Facility | Payer: Medicare Other | Attending: Internal Medicine | Admitting: Internal Medicine

## 2023-09-24 ENCOUNTER — Emergency Department (HOSPITAL_COMMUNITY): Payer: Medicare Other

## 2023-09-24 DIAGNOSIS — E039 Hypothyroidism, unspecified: Secondary | ICD-10-CM | POA: Diagnosis present

## 2023-09-24 DIAGNOSIS — Z9181 History of falling: Secondary | ICD-10-CM

## 2023-09-24 DIAGNOSIS — Z8719 Personal history of other diseases of the digestive system: Secondary | ICD-10-CM

## 2023-09-24 DIAGNOSIS — D6959 Other secondary thrombocytopenia: Secondary | ICD-10-CM | POA: Diagnosis present

## 2023-09-24 DIAGNOSIS — D500 Iron deficiency anemia secondary to blood loss (chronic): Secondary | ICD-10-CM | POA: Diagnosis present

## 2023-09-24 DIAGNOSIS — E890 Postprocedural hypothyroidism: Secondary | ICD-10-CM | POA: Diagnosis present

## 2023-09-24 DIAGNOSIS — K219 Gastro-esophageal reflux disease without esophagitis: Secondary | ICD-10-CM | POA: Diagnosis present

## 2023-09-24 DIAGNOSIS — R5381 Other malaise: Secondary | ICD-10-CM | POA: Diagnosis present

## 2023-09-24 DIAGNOSIS — Z860100 Personal history of colon polyps, unspecified: Secondary | ICD-10-CM

## 2023-09-24 DIAGNOSIS — I9589 Other hypotension: Secondary | ICD-10-CM | POA: Diagnosis present

## 2023-09-24 DIAGNOSIS — R195 Other fecal abnormalities: Secondary | ICD-10-CM

## 2023-09-24 DIAGNOSIS — F419 Anxiety disorder, unspecified: Secondary | ICD-10-CM | POA: Diagnosis present

## 2023-09-24 DIAGNOSIS — F10939 Alcohol use, unspecified with withdrawal, unspecified: Secondary | ICD-10-CM | POA: Diagnosis not present

## 2023-09-24 DIAGNOSIS — Z66 Do not resuscitate: Secondary | ICD-10-CM | POA: Diagnosis present

## 2023-09-24 DIAGNOSIS — Z882 Allergy status to sulfonamides status: Secondary | ICD-10-CM

## 2023-09-24 DIAGNOSIS — D72829 Elevated white blood cell count, unspecified: Secondary | ICD-10-CM | POA: Diagnosis not present

## 2023-09-24 DIAGNOSIS — D649 Anemia, unspecified: Secondary | ICD-10-CM | POA: Diagnosis not present

## 2023-09-24 DIAGNOSIS — F32A Depression, unspecified: Secondary | ICD-10-CM | POA: Diagnosis present

## 2023-09-24 DIAGNOSIS — I10 Essential (primary) hypertension: Secondary | ICD-10-CM | POA: Diagnosis present

## 2023-09-24 DIAGNOSIS — I2489 Other forms of acute ischemic heart disease: Secondary | ICD-10-CM | POA: Diagnosis present

## 2023-09-24 DIAGNOSIS — K709 Alcoholic liver disease, unspecified: Secondary | ICD-10-CM | POA: Diagnosis not present

## 2023-09-24 DIAGNOSIS — G629 Polyneuropathy, unspecified: Secondary | ICD-10-CM | POA: Diagnosis present

## 2023-09-24 DIAGNOSIS — E860 Dehydration: Secondary | ICD-10-CM | POA: Diagnosis present

## 2023-09-24 DIAGNOSIS — E876 Hypokalemia: Secondary | ICD-10-CM | POA: Diagnosis present

## 2023-09-24 DIAGNOSIS — Z87891 Personal history of nicotine dependence: Secondary | ICD-10-CM

## 2023-09-24 DIAGNOSIS — D693 Immune thrombocytopenic purpura: Secondary | ICD-10-CM | POA: Diagnosis present

## 2023-09-24 DIAGNOSIS — K760 Fatty (change of) liver, not elsewhere classified: Secondary | ICD-10-CM | POA: Diagnosis present

## 2023-09-24 DIAGNOSIS — Z8711 Personal history of peptic ulcer disease: Secondary | ICD-10-CM

## 2023-09-24 DIAGNOSIS — Z823 Family history of stroke: Secondary | ICD-10-CM

## 2023-09-24 DIAGNOSIS — Z515 Encounter for palliative care: Secondary | ICD-10-CM | POA: Diagnosis not present

## 2023-09-24 DIAGNOSIS — R42 Dizziness and giddiness: Secondary | ICD-10-CM | POA: Diagnosis present

## 2023-09-24 DIAGNOSIS — Z96643 Presence of artificial hip joint, bilateral: Secondary | ICD-10-CM | POA: Diagnosis present

## 2023-09-24 DIAGNOSIS — Z91048 Other nonmedicinal substance allergy status: Secondary | ICD-10-CM

## 2023-09-24 DIAGNOSIS — K922 Gastrointestinal hemorrhage, unspecified: Secondary | ICD-10-CM | POA: Diagnosis present

## 2023-09-24 DIAGNOSIS — F10231 Alcohol dependence with withdrawal delirium: Secondary | ICD-10-CM | POA: Diagnosis not present

## 2023-09-24 DIAGNOSIS — E032 Hypothyroidism due to medicaments and other exogenous substances: Secondary | ICD-10-CM | POA: Diagnosis not present

## 2023-09-24 DIAGNOSIS — D539 Nutritional anemia, unspecified: Secondary | ICD-10-CM | POA: Diagnosis present

## 2023-09-24 DIAGNOSIS — G8929 Other chronic pain: Secondary | ICD-10-CM | POA: Diagnosis present

## 2023-09-24 DIAGNOSIS — H109 Unspecified conjunctivitis: Secondary | ICD-10-CM | POA: Diagnosis not present

## 2023-09-24 DIAGNOSIS — F1010 Alcohol abuse, uncomplicated: Secondary | ICD-10-CM | POA: Diagnosis not present

## 2023-09-24 DIAGNOSIS — Z8249 Family history of ischemic heart disease and other diseases of the circulatory system: Secondary | ICD-10-CM

## 2023-09-24 DIAGNOSIS — D684 Acquired coagulation factor deficiency: Secondary | ICD-10-CM | POA: Diagnosis present

## 2023-09-24 DIAGNOSIS — Z885 Allergy status to narcotic agent status: Secondary | ICD-10-CM

## 2023-09-24 DIAGNOSIS — D531 Other megaloblastic anemias, not elsewhere classified: Principal | ICD-10-CM | POA: Diagnosis present

## 2023-09-24 DIAGNOSIS — R55 Syncope and collapse: Secondary | ICD-10-CM | POA: Diagnosis present

## 2023-09-24 DIAGNOSIS — Z79899 Other long term (current) drug therapy: Secondary | ICD-10-CM

## 2023-09-24 DIAGNOSIS — Z79890 Hormone replacement therapy (postmenopausal): Secondary | ICD-10-CM

## 2023-09-24 DIAGNOSIS — Z7189 Other specified counseling: Secondary | ICD-10-CM | POA: Diagnosis not present

## 2023-09-24 LAB — COMPREHENSIVE METABOLIC PANEL
ALT: 23 U/L (ref 0–44)
AST: 49 U/L — ABNORMAL HIGH (ref 15–41)
Albumin: 3 g/dL — ABNORMAL LOW (ref 3.5–5.0)
Alkaline Phosphatase: 195 U/L — ABNORMAL HIGH (ref 38–126)
Anion gap: 16 — ABNORMAL HIGH (ref 5–15)
BUN: 11 mg/dL (ref 8–23)
CO2: 27 mmol/L (ref 22–32)
Calcium: 8.2 mg/dL — ABNORMAL LOW (ref 8.9–10.3)
Chloride: 96 mmol/L — ABNORMAL LOW (ref 98–111)
Creatinine, Ser: 0.95 mg/dL (ref 0.61–1.24)
GFR, Estimated: >60 mL/min (ref >60)
Glucose, Bld: 75 mg/dL (ref 70–99)
Potassium: 2.7 mmol/L — CL (ref 3.5–5.1)
Sodium: 139 mmol/L (ref 135–145)
Total Bilirubin: 2.1 mg/dL — ABNORMAL HIGH (ref 0.3–1.2)
Total Protein: 5.6 g/dL — ABNORMAL LOW (ref 6.5–8.1)

## 2023-09-24 LAB — I-STAT CHEM 8, ED
BUN: 10 mg/dL (ref 8–23)
Calcium, Ion: 1.03 mmol/L — ABNORMAL LOW (ref 1.15–1.40)
Chloride: 96 mmol/L — ABNORMAL LOW (ref 98–111)
Creatinine, Ser: 0.8 mg/dL (ref 0.61–1.24)
Glucose, Bld: 76 mg/dL (ref 70–99)
HCT: 19 % — ABNORMAL LOW (ref 39.0–52.0)
Hemoglobin: 6.5 g/dL — CL (ref 13.0–17.0)
Potassium: 2.7 mmol/L — CL (ref 3.5–5.1)
Sodium: 138 mmol/L (ref 135–145)
TCO2: 26 mmol/L (ref 22–32)

## 2023-09-24 LAB — CBC WITH DIFFERENTIAL/PLATELET
Abs Immature Granulocytes: 0.21 10*3/uL — ABNORMAL HIGH (ref 0.00–0.07)
Basophils Absolute: 0 10*3/uL (ref 0.0–0.1)
Basophils Relative: 0 %
Eosinophils Absolute: 0.5 10*3/uL (ref 0.0–0.5)
Eosinophils Relative: 4 %
HCT: 18.5 % — ABNORMAL LOW (ref 39.0–52.0)
Hemoglobin: 5.9 g/dL — CL (ref 13.0–17.0)
Immature Granulocytes: 2 %
Lymphocytes Relative: 8 %
Lymphs Abs: 1.1 10*3/uL (ref 0.7–4.0)
MCH: 36.9 pg — ABNORMAL HIGH (ref 26.0–34.0)
MCHC: 31.9 g/dL (ref 30.0–36.0)
MCV: 115.6 fL — ABNORMAL HIGH (ref 80.0–100.0)
Monocytes Absolute: 0.9 10*3/uL (ref 0.1–1.0)
Monocytes Relative: 7 %
Neutro Abs: 11 10*3/uL — ABNORMAL HIGH (ref 1.7–7.7)
Neutrophils Relative %: 79 %
Platelets: 49 10*3/uL — ABNORMAL LOW (ref 150–400)
RBC: 1.6 MIL/uL — ABNORMAL LOW (ref 4.22–5.81)
RDW: 20.5 % — ABNORMAL HIGH (ref 11.5–15.5)
Smear Review: DECREASED
WBC: 13.8 10*3/uL — ABNORMAL HIGH (ref 4.0–10.5)
nRBC: 0.4 % — ABNORMAL HIGH (ref 0.0–0.2)

## 2023-09-24 LAB — IRON AND TIBC
Iron: 66 ug/dL (ref 45–182)
Saturation Ratios: 40 % — ABNORMAL HIGH (ref 17.9–39.5)
TIBC: 167 ug/dL — ABNORMAL LOW (ref 250–450)
UIBC: 101 ug/dL

## 2023-09-24 LAB — PROTIME-INR
INR: 1.3 — ABNORMAL HIGH (ref 0.8–1.2)
Prothrombin Time: 16.2 s — ABNORMAL HIGH (ref 11.4–15.2)

## 2023-09-24 LAB — MAGNESIUM: Magnesium: 1.2 mg/dL — ABNORMAL LOW (ref 1.7–2.4)

## 2023-09-24 LAB — RETICULOCYTES
Immature Retic Fract: 17.2 % — ABNORMAL HIGH (ref 2.3–15.9)
RBC.: 1.6 MIL/uL — ABNORMAL LOW (ref 4.22–5.81)
Retic Count, Absolute: 61 10*3/uL (ref 19.0–186.0)
Retic Ct Pct: 3.8 % — ABNORMAL HIGH (ref 0.4–3.1)

## 2023-09-24 LAB — TROPONIN I (HIGH SENSITIVITY)
Troponin I (High Sensitivity): 115 ng/L (ref <18)
Troponin I (High Sensitivity): 117 ng/L (ref <18)

## 2023-09-24 LAB — VITAMIN B12: Vitamin B-12: 898 pg/mL (ref 180–914)

## 2023-09-24 LAB — PHOSPHORUS: Phosphorus: 1.4 mg/dL — ABNORMAL LOW (ref 2.5–4.6)

## 2023-09-24 LAB — POC OCCULT BLOOD, ED: Fecal Occult Bld: POSITIVE — AB

## 2023-09-24 LAB — ETHANOL: Alcohol, Ethyl (B): <10 mg/dL (ref <10)

## 2023-09-24 LAB — FERRITIN: Ferritin: 213 ng/mL (ref 24–336)

## 2023-09-24 LAB — PREPARE RBC (CROSSMATCH)

## 2023-09-24 LAB — FOLATE: Folate: 4.3 ng/mL — ABNORMAL LOW (ref >5.9)

## 2023-09-24 MED ORDER — LORAZEPAM 1 MG PO TABS: 1.0000 mg | ORAL_TABLET | ORAL | Status: AC | PRN

## 2023-09-24 MED ORDER — CYANOCOBALAMIN 1000 MCG/ML IJ SOLN
1000.0000 ug | Freq: Once | INTRAMUSCULAR | Status: AC
  Administered 2023-09-25: 1000 ug via INTRAMUSCULAR
  Filled 2023-09-24: qty 1

## 2023-09-24 MED ORDER — FOLIC ACID 1 MG PO TABS
1.0000 mg | ORAL_TABLET | Freq: Every day | ORAL | Status: DC
  Administered 2023-09-24 – 2023-10-04 (×11): 1 mg via ORAL
  Filled 2023-09-24 (×11): qty 1

## 2023-09-24 MED ORDER — SODIUM CHLORIDE 0.9% IV SOLUTION: Freq: Once | INTRAVENOUS | Status: AC

## 2023-09-24 MED ORDER — POTASSIUM CHLORIDE CRYS ER 20 MEQ PO TBCR
40.0000 meq | EXTENDED_RELEASE_TABLET | Freq: Once | ORAL | Status: AC
  Administered 2023-09-24: 40 meq via ORAL
  Filled 2023-09-24: qty 2

## 2023-09-24 MED ORDER — MAGNESIUM SULFATE 2 GM/50ML IV SOLN
2.0000 g | Freq: Once | INTRAVENOUS | Status: AC
  Administered 2023-09-24: 2 g via INTRAVENOUS
  Filled 2023-09-24: qty 50

## 2023-09-24 MED ORDER — ACETAMINOPHEN 325 MG PO TABS: 650.0000 mg | ORAL_TABLET | Freq: Four times a day (QID) | ORAL | Status: DC | PRN

## 2023-09-24 MED ORDER — PANTOPRAZOLE SODIUM 40 MG IV SOLR
40.0000 mg | Freq: Two times a day (BID) | INTRAVENOUS | Status: DC
  Administered 2023-09-25 – 2023-09-26 (×3): 40 mg via INTRAVENOUS
  Filled 2023-09-24 (×3): qty 10

## 2023-09-24 MED ORDER — SENNOSIDES-DOCUSATE SODIUM 8.6-50 MG PO TABS: 1.0000 | ORAL_TABLET | Freq: Every evening | ORAL | Status: DC | PRN

## 2023-09-24 MED ORDER — LEVOTHYROXINE SODIUM 75 MCG PO TABS
175.0000 ug | ORAL_TABLET | Freq: Every day | ORAL | Status: DC
  Administered 2023-09-25 – 2023-10-04 (×10): 175 ug via ORAL
  Filled 2023-09-24 (×10): qty 1

## 2023-09-24 MED ORDER — POTASSIUM CHLORIDE 10 MEQ/100ML IV SOLN
10.0000 meq | INTRAVENOUS | Status: AC
  Administered 2023-09-24 – 2023-09-25 (×3): 10 meq via INTRAVENOUS
  Filled 2023-09-24 (×3): qty 100

## 2023-09-24 MED ORDER — LORAZEPAM 2 MG/ML IJ SOLN
1.0000 mg | INTRAMUSCULAR | Status: AC | PRN
  Administered 2023-09-26 – 2023-09-27 (×4): 2 mg via INTRAVENOUS
  Filled 2023-09-24 (×4): qty 1

## 2023-09-24 MED ORDER — LACTATED RINGERS IV BOLUS
1000.0000 mL | Freq: Once | INTRAVENOUS | Status: AC
  Administered 2023-09-24: 1000 mL via INTRAVENOUS

## 2023-09-24 MED ORDER — PANTOPRAZOLE 80MG IVPB - SIMPLE MED
80.0000 mg | Freq: Once | INTRAVENOUS | Status: AC
  Administered 2023-09-24: 80 mg via INTRAVENOUS
  Filled 2023-09-24: qty 100

## 2023-09-24 MED ORDER — ACETAMINOPHEN 650 MG RE SUPP: 650.0000 mg | Freq: Four times a day (QID) | RECTAL | Status: DC | PRN

## 2023-09-24 MED ORDER — MIDODRINE HCL 5 MG PO TABS
5.0000 mg | ORAL_TABLET | Freq: Three times a day (TID) | ORAL | Status: DC
  Administered 2023-09-24 – 2023-10-04 (×17): 5 mg via ORAL
  Filled 2023-09-24 (×26): qty 1

## 2023-09-24 MED ORDER — SERTRALINE HCL 50 MG PO TABS
50.0000 mg | ORAL_TABLET | Freq: Every day | ORAL | Status: DC
  Administered 2023-09-25 – 2023-10-03 (×9): 50 mg via ORAL
  Filled 2023-09-24 (×9): qty 1

## 2023-09-24 MED ORDER — THIAMINE HCL 100 MG/ML IJ SOLN
100.0000 mg | Freq: Every day | INTRAMUSCULAR | Status: DC
  Administered 2023-09-25 – 2023-09-27 (×3): 100 mg via INTRAVENOUS
  Filled 2023-09-24 (×4): qty 2

## 2023-09-24 MED ORDER — THIAMINE HCL 100 MG PO TABS: 100.0000 mg | ORAL_TABLET | Freq: Every day | ORAL | Status: DC

## 2023-09-24 MED ORDER — THIAMINE MONONITRATE 100 MG PO TABS
100.0000 mg | ORAL_TABLET | Freq: Every day | ORAL | Status: DC
  Administered 2023-09-24 – 2023-10-04 (×8): 100 mg via ORAL
  Filled 2023-09-24 (×10): qty 1

## 2023-09-24 MED ORDER — MIDODRINE HCL 5 MG PO TABS: 5.0000 mg | ORAL_TABLET | Freq: Once | ORAL | Status: DC

## 2023-09-24 NOTE — ED Notes (Signed)
Patient transported to CT 

## 2023-09-24 NOTE — ED Notes (Signed)
Admitting Provider at bedside. 

## 2023-09-24 NOTE — ED Notes (Signed)
ED TO INPATIENT HANDOFF REPORT  ED Nurse Name and Phone #: Marcello Moores [phone]  S Name/Age/Gender Philip Humphrey 62 y.o. male Room/Bed: 025C/025C  Code Status   Code Status: Prior  Home/SNF/Other Home Patient oriented to: self, place, time, and situation Is this baseline? Yes   Triage Complete: Triage complete  Chief Complaint Symptomatic anemia [D64.9]  Triage Note Pt here from home with c/o multiple falls over the last 2 days , pt states that he got dizzy before he fell along with some chest tightness   Allergies Allergies  Allergen Reactions   Sulfa Antibiotics Anaphylaxis, Swelling and Other (See Comments)    Mouth and throat swelling   Oxycodone Itching   Nickel Rash and Other (See Comments)    Caused cold sores    Level of Care/Admitting Diagnosis ED Disposition     ED Disposition  Admit   Condition  --   Comment  Hospital Area: [facility] [[id number]]  Level of Care: Progressive [102]  Admit to Progressive based on following criteria: GI, ENDOCRINE disease patients with GI bleeding, acute liver failure or pancreatitis, stable with diabetic ketoacidosis or thyrotoxicosis (hypothyroid) state.  May admit patient to [facility] or [facility] if equivalent level of care is available:: No  Covid Evaluation: Asymptomatic - no recent exposure (last 10 days) testing not required  Diagnosis: Symptomatic anemia [1478295]  Admitting Physician: Gery Pray [[id number]]  Attending Physician: Gery Pray [[id number]]  Certification:: I certify this patient will need inpatient services for at least 2 midnights  Expected Medical Readiness: 09/26/2023          B Medical/Surgery History Past Medical History:  Diagnosis Date   Allergy    occ. seasonal   Anxiety    Arthritis    Colon polyp    Condyloma    anal   Depression    GERD (gastroesophageal reflux disease)    History of transfusion    as child   Hypertension    Knee injury    right knee cap  w piece broken off- MRI done 02-06-15-pending surgery. 11-18-16 right knee is still painful, both shoulders(limited ROM right).   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 thyroid tissue remains-uses daily supplement   Past Surgical History:  Procedure Laterality Date   COLONOSCOPY     COLONOSCOPY N/A 02/15/2015   Procedure: COLONOSCOPY;  Surgeon: Ovidio Kin, MD;  Location: [facility];  Service: General;  Laterality: N/A;   cyst removed     from neck / abdomed   EXAMINATION UNDER ANESTHESIA N/A 02/15/2015   Procedure: EXAM UNDER ANESTHESIA;  Surgeon: Valarie Merino, MD;  Location: [facility];  Service: General;  Laterality: N/A;   HERNIA REPAIR Left    10 yrs ago   KNEE ARTHROSCOPY Left    x1 and meniscal tear    ROTATOR CUFF REPAIR Right    x1   SHOULDER ARTHROSCOPY W/ ROTATOR CUFF REPAIR Left    THYROIDECTOMY  age 68    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 Charlann Boxer 08/03/17   TOTAL HIP ARTHROPLASTY  Right 08/03/2017   Procedure: RIGHT TOTAL HIP ARTHROPLASTY ANTERIOR APPROACH;  Surgeon: [name], MD;  Location: [facility];  Service: Orthopedics;  Laterality: Right;  70 mins   UMBILICAL HERNIA REPAIR N/A 10/18/2017   Procedure: Umbilical hernia repair  and excision of lipomas;  Surgeon: Luretha Murphy, MD;  Location: [facility];  Service: General;  Laterality: N/A;   WART FULGURATION N/A 02/15/2015   Procedure: REMOVAL OF ANAL TAGS,CONDYLOMA;  Surgeon: Valarie Merino, MD;  Location: [facility];  Service: General;  Laterality: N/A;   WRIST SURGERY     x2- left (repair tendon/ ligament)     A IV Location/Drains/Wounds Patient Lines/Drains/Airways Status      Active Line/Drains/Airways     Name Placement date Placement time Site Days   Peripheral IV 09/24/23 20 G Right Antecubital 09/24/23  --  Antecubital  less than 1   Peripheral IV 09/24/23 20 G Left Antecubital 09/24/23  2116  Antecubital  less than 1   Incision - 2 Ports Abdomen Right;Upper Left;Upper 10/18/17  1501  -- 2167            Intake/Output Last 24 hours  Intake/Output Summary (Last 24 hours) at 09/24/2023 2223 Last data filed at 09/24/2023 2144 Gross per 24 hour  Intake 325 ml  Output --  Net 325 ml    Labs/Imaging Results for orders placed or performed during the hospital encounter of 09/24/23 (from the past 48 hour(s))  I-stat chem 8, ED     Status: Abnormal   Collection Time: 09/24/23  7:30 PM  Result Value Ref Range   Sodium 138 135 - 145 mmol/L   Potassium 2.7 (LL) 3.5 - 5.1 mmol/L   Chloride 96 (L) 98 - 111 mmol/L   BUN 10 8 - 23 mg/dL   Creatinine, Ser 4.09 0.61 - 1.24 mg/dL   Glucose, Bld 76 70 - 99 mg/dL    Comment: Glucose reference range applies only to samples taken after fasting for at least 8 hours.   Calcium, Ion 1.03 (L) 1.15 - 1.40 mmol/L   TCO2 26 22 - 32 mmol/L   Hemoglobin 6.5 (LL) 13.0 - 17.0 g/dL   HCT 81.1 (L) 91.4 - 78.2 %   Comment NOTIFIED PHYSICIAN   Ethanol     Status: None   Collection Time: 09/24/23  7:38 PM  Result Value Ref Range   Alcohol, Ethyl (B) <10 <10 mg/dL    Comment: (NOTE) Lowest detectable limit for serum alcohol is 10 mg/dL.  For medical purposes only. Performed at [facility] [place] [facility], [address], [place], [place] [address]   Type and screen [facility]     Status: None (Preliminary result)   Collection Time: 09/24/23  7:43 PM  Result Value Ref Range   ABO/RH(D) A POS    Antibody Screen NEG    Sample Expiration      09/27/2023,2359 Performed at [facility], [address] [place], [place] [address]    Unit Number [id number]    Blood Component Type RED CELLS,LR    Unit  division 00    Status of Unit ISSUED    Transfusion Status OK TO TRANSFUSE    Crossmatch Result Compatible    Unit Number [id number]    Blood Component Type RED CELLS,LR    Unit division 00    Status of Unit ALLOCATED    Transfusion Status OK TO TRANSFUSE    Crossmatch Result Compatible    Unit Number  [id number]    Blood Component Type RED CELLS,LR    Unit division 00    Status of Unit ALLOCATED    Transfusion Status OK TO TRANSFUSE    Crossmatch Result Compatible   Protime-INR     Status: Abnormal   Collection Time: 09/24/23  7:43 PM  Result Value Ref Range   Prothrombin Time 16.2 (H) 11.4 - 15.2 seconds   INR 1.3 (H) 0.8 - 1.2    Comment: (NOTE) INR goal varies based on device and disease states. Performed at [facility], [address] [place], [place] [address]   CBC with Differential     Status: Abnormal   Collection Time: 09/24/23  7:45 PM  Result Value Ref Range   WBC 13.8 (H) 4.0 - 10.5 K/uL   RBC 1.60 (L) 4.22 - 5.81 MIL/uL   Hemoglobin 5.9 (LL) 13.0 - 17.0 g/dL    Comment: REPEATED TO VERIFY THIS CRITICAL RESULT HAS VERIFIED AND BEEN CALLED TO Karin Griffith, RN BY [employer] ON 10 04 2024 AT 2013, AND HAS BEEN READ BACK.     HCT 18.5 (L) 39.0 - 52.0 %   MCV 115.6 (H) 80.0 - 100.0 fL   MCH 36.9 (H) 26.0 - 34.0 pg   MCHC 31.9 30.0 - 36.0 g/dL   RDW 78.2 (H) 95.6 - 21.3 %   Platelets 49 (L) 150 - 400 K/uL    Comment: Immature Platelet Fraction may be clinically indicated, consider ordering this additional test [id number] REPEATED TO VERIFY PLATELET COUNT CONFIRMED BY SMEAR    nRBC 0.4 (H) 0.0 - 0.2 %   Neutrophils Relative % 79 %   Neutro Abs 11.0 (H) 1.7 - 7.7 K/uL   Lymphocytes Relative 8 %   Lymphs Abs 1.1 0.7 - 4.0 K/uL   Monocytes Relative 7 %   Monocytes Absolute 0.9 0.1 - 1.0 K/uL   Eosinophils Relative 4 %   Eosinophils Absolute 0.5 0.0 - 0.5 K/uL   Basophils Relative 0 %   Basophils Absolute 0.0 0.0 - 0.1 K/uL   WBC  Morphology MORPHOLOGY UNREMARKABLE    RBC Morphology See Note    Smear Review PLATELETS APPEAR DECREASED    Immature Granulocytes 2 %   Abs Immature Granulocytes 0.21 (H) 0.00 - 0.07 K/uL   Tear Drop Cells PRESENT    Polychromasia PRESENT     Comment: Performed at [facility], [address], [place], [place] [address]  Comprehensive metabolic panel     Status: Abnormal   Collection Time: 09/24/23  7:45 PM  Result Value Ref Range   Sodium 139 135 - 145 mmol/L   Potassium 2.7 (LL) 3.5 - 5.1 mmol/L    Comment: CRITICAL RESULT CALLED TO, READ BACK BY AND VERIFIED WITH I.Clemie General,RN @2033  09/24/2023 VANG.J   Chloride 96 (L) 98 - 111 mmol/L   CO2 27 22 - 32 mmol/L   Glucose, Bld 75 70 - 99 mg/dL    Comment: Glucose reference range applies only to samples taken after fasting for at least 8 hours.   BUN 11 8 - 23 mg/dL   Creatinine, Ser 2.84 0.61 - 1.24 mg/dL   Calcium 8.2 (L) 8.9 - 10.3 mg/dL   Total Protein 5.6 (L) 6.5 - 8.1 g/dL   Albumin 3.0 (L) 3.5 - 5.0 g/dL   AST 49 (H) 15 - 41 U/L   ALT 23 0 - 44 U/L   Alkaline Phosphatase 195 (H) 38 - 126 U/L  Total Bilirubin 2.1 (H) 0.3 - 1.2 mg/dL   GFR, Estimated >62 >13 mL/min    Comment: (NOTE) Calculated using the CKD-EPI Creatinine Equation (2021)    Anion gap 16 (H) 5 - 15    Comment: Performed at [facility], [address] [place], [place] [address]  Troponin I (High Sensitivity)     Status: Abnormal   Collection Time: 09/24/23  7:45 PM  Result Value Ref Range   Troponin I (High Sensitivity) 115 (HH) <18 ng/L    Comment: CRITICAL RESULT CALLED TO, READ BACK BY AND VERIFIED WITH I.Shawntelle Ungar,RN @2033  09/24/2023 VANG.J (NOTE) Elevated high sensitivity troponin I (hsTnI) values and significant  changes across serial measurements may suggest ACS but many other  chronic and acute conditions are known to elevate hsTnI results.  Refer to the "Links" section for chest pain algorithms and additional  guidance. Performed at [facility], [address] [place], [place] [address]   Magnesium     Status: Abnormal   Collection Time: 09/24/23  7:45 PM  Result Value Ref Range   Magnesium 1.2 (L) 1.7 - 2.4 mg/dL    Comment: Performed at [facility], [address] [place], [place] [address]  Prepare RBC (crossmatch)     Status: None   Collection Time: 09/24/23  8:23 PM  Result Value Ref Range   Order Confirmation      ORDER PROCESSED BY BLOOD BANK Performed at [facility], [address] [place], [place] [address]   POC occult blood, ED     Status: Abnormal   Collection Time: 09/24/23  8:54 PM  Result Value Ref Range   Fecal Occult Bld POSITIVE (A) NEGATIVE  Troponin I (High Sensitivity)     Status: Abnormal   Collection Time: 09/24/23  9:10 PM  Result Value Ref Range   Troponin I (High Sensitivity) 117 (HH) <18 ng/L    Comment: CRITICAL VALUE NOTED. VALUE IS CONSISTENT WITH PREVIOUSLY REPORTED/CALLED VALUE (NOTE) Elevated high sensitivity troponin I (hsTnI) values and significant  changes across serial measurements may suggest ACS but many other  chronic and acute conditions are known to elevate hsTnI results.  Refer to the "Links" section for chest pain algorithms and additional  guidance. Performed at [facility], [address], [place], [place] [address]   Iron and TIBC     Status: Abnormal   Collection Time: 09/24/23  9:10 PM  Result Value Ref Range   Iron 66 45 - 182 ug/dL   TIBC 725 (L) 366 - 440 ug/dL   Saturation Ratios 40 (H) 17.9 - 39.5 %   UIBC 101 ug/dL    Comment: Performed at [facility], [address], [place], [place] [address]  Ferritin     Status: None   Collection Time: 09/24/23  9:10 PM  Result Value Ref Range   Ferritin 213 24 - 336 ng/mL    Comment: Performed at [facility], [address] [place], [place] [address]  Reticulocytes     Status: Abnormal   Collection Time: 09/24/23  9:10 PM  Result Value Ref Range   Retic Ct Pct 3.8  (H) 0.4 - 3.1 %   RBC. 1.60 (L) 4.22 - 5.81 MIL/uL   Retic Count, Absolute 61.0 19.0 - 186.0 K/uL   Immature Retic Fract 17.2 (H) 2.3 - 15.9 %    Comment: Performed at [facility], [address],  [place], [place] [address]  Phosphorus     Status: Abnormal   Collection Time: 09/24/23  9:10 PM  Result Value Ref Range   Phosphorus 1.4 (L) 2.5 - 4.6 mg/dL    Comment: Performed at [facility], [address], [place], [place] [address]  Prepare RBC (crossmatch)     Status: None   Collection Time: 09/24/23 10:30 PM  Result Value Ref Range   Order Confirmation      ORDER PROCESSED BY BLOOD BANK Performed at [facility], [address], [place], [place] [address]    CT Head Wo Contrast  Result Date: 09/24/2023 CLINICAL DATA:  Fall, dizziness, headache. EXAM: CT HEAD WITHOUT CONTRAST TECHNIQUE: Contiguous axial images were obtained from the base of the skull through the vertex without intravenous contrast. RADIATION DOSE REDUCTION: This exam was performed according to the departmental dose-optimization program which includes automated exposure control, adjustment of the mA and/or kV according to patient size and/or use of iterative reconstruction technique. COMPARISON:  07/23/2023 FINDINGS: Brain: No intracranial hemorrhage, mass effect, or midline shift. Brain volume is normal for age. No hydrocephalus. The basilar cisterns are patent. Minor periventricular chronic small vessel ischemia. No evidence of territorial infarct or acute ischemia. No extra-axial or intracranial fluid collection. Vascular: No hyperdense vessel or unexpected calcification. Skull: No fracture or focal lesion. Sinuses/Orbits: No acute findings. Partial opacification of left mastoid air cells, unchanged. Other: None. IMPRESSION: 1. No acute intracranial abnormality. No skull fracture. 2. Minor chronic small vessel ischemia. Electronically Signed   By: Narda Rutherford M.D.   On: 09/24/2023 21:36   DG Chest  Portable 1 View  Result Date: 09/24/2023 CLINICAL DATA:  Chest pain and weakness. EXAM: PORTABLE CHEST 1 VIEW COMPARISON:  07/23/2023 FINDINGS: Low lung volumes persist. Normal heart size with unchanged mediastinal contours. No focal airspace disease. No pleural effusion or pneumothorax. No pulmonary edema. IMPRESSION: Low lung volumes without acute chest finding. Electronically Signed   By: Narda Rutherford M.D.   On: 09/24/2023 21:32    Pending Labs Unresulted Labs (From admission, onward)     Start     Ordered   09/25/23 1500  Hemoglobin and hematocrit, blood  Now then every 8 hours,   R      09/24/23 2204   09/24/23 2049  Vitamin B12  (Anemia Panel (PNL))  Once,   URGENT        09/24/23 2054   09/24/23 2049  Folate  (Anemia Panel (PNL))  Once,   URGENT        09/24/23 2054   09/24/23 1921  Urinalysis, w/ Reflex to Culture (Infection Suspected) -Urine, Clean Catch  Once,   URGENT       Question:  Specimen Source  Answer:  Urine, Clean Catch   09/24/23 1920            Vitals/Pain Today's Vitals   09/24/23 2144 09/24/23 2145 09/24/23 2200 09/24/23 2217  BP: 96/74 90/63 (!) 86/59 (!) 85/55  Pulse: 100 (!) 101 (!) 101 100  Resp: 13 18 19    Temp: (!) 97.5 F (36.4 C)  (!) 97.5 F (36.4 C)   TempSrc: Oral  Oral   SpO2:  96%      Isolation Precautions No active isolations  Medications Medications  potassium chloride 10 mEq in 100 mL IVPB (10 mEq Intravenous New Bag/Given 09/24/23 2125)  pantoprazole (PROTONIX) 80 mg /NS 100 mL IVPB (has no administration in time range)  magnesium sulfate IVPB  2 g 50 mL (2 g Intravenous New Bag/Given 09/24/23 2124)  cyanocobalamin (VITAMIN B12) injection 1,000 mcg (has no administration in time range)  folic acid (FOLVITE) tablet 1 mg (has no administration in time range)  midodrine (PROAMATINE) tablet 5 mg (has no administration in time range)  sertraline (ZOLOFT) tablet 50 mg (has no administration in time range)  levothyroxine (SYNTHROID)  tablet 175 mcg (has no administration in time range)  0.9 %  sodium chloride infusion (Manually program via Guardrails IV Fluids) (has no administration in time range)  LORazepam (ATIVAN) tablet 1-4 mg (has no administration in time range)    Or  LORazepam (ATIVAN) injection 1-4 mg (has no administration in time range)  thiamine (VITAMIN B1) tablet 100 mg (has no administration in time range)    Or  thiamine (VITAMIN B1) injection 100 mg (has no administration in time range)  lactated ringers bolus 1,000 mL (1,000 mLs Intravenous New Bag/Given 09/24/23 1946)  0.9 %  sodium chloride infusion (Manually program via Guardrails IV Fluids) (0 mLs Intravenous Stopped 09/24/23 2143)  potassium chloride SA (KLOR-CON M) CR tablet 40 mEq (40 mEq Oral Given 09/24/23 2120)    Mobility walks     Focused Assessments     R Recommendations: See Admitting Provider Note  Report given to:   Additional Notes:

## 2023-09-24 NOTE — H&P (Incomplete)
PCP:   Courtney Paris, NP   Chief Complaint:  Syncope and collapse  HPI: This is a 62 year old male with past medical history of alcohol abuse, chronic hypotension, chronic thrombocytopenia, hereditary hematochromatosis, hypothyroidism, alcohol abuse.  He was brought in today after he became dizzy and passed out while in his yard.  He states he has been having increased episodes of dizziness and syncopal episode in the past 2 or 3 months.  He endorses some epigastric discomfort.  He additionally endorses chest pains which she states started on the right side and then traveled to the left that up his left jaw.  He describes the pain is a significant 8/10 but not long-lasting.  It can occur while he is at rest or with activity.  He states sometimes associated with syncope sometimes.  He describes the pain as burning/heavy.  Current pain level 0/10.  Patient has a history of alcohol abuse, drinking bourbon daily.  He states he has not drank in the last 2 days.  He denies having history of withdrawal symptoms.  Patient reports a history of gastric ulcer.  The patient was recently admitted 8/2 to 8/5 with posterior dizziness and near syncope.  At that point patient's hemoglobin was 6.6.  He received 1 unit of blood.  GI was consulted. EGD/colonoscopy recommended.  Patient refused and signed out AMA.   Today in the ER patient's hemoglobin 5.9, platelets 49, WBC 13.8, MCV 115.6, folate 4.3, iron 66 (normal).  Potassium 2.7, phosphorus 1.4, magnesium 1.2.  Troponin 115=>117.  EKG NSR without acute ischemic changes.  INR 1.3, fecal occult positive.  Alcohol level normal  Review of Systems:  Per HPI  Past Medical History: Past Medical History:  Diagnosis Date   Allergy    occ. seasonal   Anxiety    Arthritis    Colon polyp    Condyloma    anal   Depression    GERD (gastroesophageal reflux disease)    History of transfusion    as child   Hypertension    Knee injury    right knee cap w piece  broken off- MRI done 02-06-15-pending surgery. 11-18-16 right knee is still painful, both shoulders(limited ROM right).   Knee pain    Neuropathy    bilateral- greater left   Palpitations    normal stress test 10 yrs ago   Pneumonia    Thyroid disease    Thyroid removed unintentionally at age 72- no further thyroid tissue remains-uses daily supplement   Past Surgical History:  Procedure Laterality Date   COLONOSCOPY     COLONOSCOPY N/A 02/15/2015   Procedure: COLONOSCOPY;  Surgeon: Ovidio Kin, MD;  Location: [facility];  Service: General;  Laterality: N/A;   cyst removed     from neck / abdomed   EXAMINATION UNDER ANESTHESIA N/A 02/15/2015   Procedure: EXAM UNDER ANESTHESIA;  Surgeon: Valarie Merino, MD;  Location: [facility];  Service: General;  Laterality: N/A;   HERNIA REPAIR Left    10 yrs ago   KNEE ARTHROSCOPY Left    x1 and meniscal tear    ROTATOR CUFF REPAIR Right    x1   SHOULDER ARTHROSCOPY W/ ROTATOR CUFF REPAIR Left    THYROIDECTOMY  age 89    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 Charlann Boxer 08/03/17   TOTAL HIP ARTHROPLASTY Right 08/03/2017   Procedure: RIGHT TOTAL HIP ARTHROPLASTY ANTERIOR APPROACH;  Surgeon: [name], MD;  Location: [facility];  Service: Orthopedics;  Laterality: Right;  70 mins   UMBILICAL HERNIA REPAIR N/A 10/18/2017   Procedure: Umbilical hernia repair  and excision of lipomas;  Surgeon: Luretha Murphy, MD;  Location: [facility];  Service: General;  Laterality: N/A;   WART FULGURATION N/A 02/15/2015   Procedure: REMOVAL OF ANAL TAGS,CONDYLOMA;  Surgeon: Valarie Merino, MD;  Location: [facility];  Service: General;  Laterality: N/A;   WRIST SURGERY     x2- left (repair tendon/ ligament)    Medications: Prior to Admission medications   Medication Sig Start Date End Date  Taking? Authorizing Provider  atorvastatin (LIPITOR) 80 MG tablet Take 80 mg by mouth daily. Patient not taking: Reported on 07/23/2023 10/09/16   [provider]  bismuth subsalicylate (PEPTO BISMOL) 262 MG/15ML suspension Take 30 mLs by mouth every 6 (six) hours as needed for indigestion (stomach pain).    [provider]  calcium carbonate (TUMS - DOSED IN MG ELEMENTAL CALCIUM) 500 MG chewable tablet Chew 2 tablets by mouth at bedtime as needed for indigestion or heartburn.    [provider]  Cyanocobalamin (B-12) 1000 MCG SUBL Place 1,000 mcg under the tongue daily. 07/26/22   Briant Cedar, PA-C  diclofenac Sodium (VOLTAREN) 1 % GEL Apply 1 application topically 2 (two) times daily as needed (pain).    [provider]  diphenhydramine-acetaminophen (TYLENOL PM) 25-500 MG TABS tablet Take 2-3 tablets by mouth at bedtime as needed (for sleep).    [provider]  famotidine (PEPCID) 40 MG tablet Take 40 mg by mouth daily as needed for heartburn or indigestion. Patient not taking: Reported on 07/23/2023 01/25/21   [provider]  folic acid (FOLVITE) 1 MG tablet Take 1 tablet (1 mg total) by mouth daily. Patient not taking: Reported on 07/23/2023 04/14/22   Leroy Sea, MD  gabapentin (NEURONTIN) 300 MG capsule Take 300-600 mg by mouth 2 (two) times daily. Take 1 capsule (300 mg) in the morning and Take 2 capsules (600 mg) at bedtime Patient not taking: Reported on 07/23/2023    [provider]  hydrOXYzine (ATARAX/VISTARIL) 25 MG tablet Take 25 mg by mouth every 4 (four) hours as needed for anxiety. For itching Patient not taking: Reported on 07/23/2023 06/17/21   [provider]  lisinopril (ZESTRIL) 20 MG tablet Take 20 mg by mouth daily. Patient not taking: Reported on 07/23/2023 08/02/22   [provider]  Magnesium 400 MG TABS Take 400 mg by mouth daily.    [provider]  MELATONIN PO Take 1 tablet by mouth  at bedtime as needed (sleep).    [provider]  midodrine (PROAMATINE) 2.5 MG tablet Take 5 mg by mouth 3 (three) times daily. Patient not taking: Reported on 07/23/2023 09/03/22   [provider]  nitroGLYCERIN (NITROSTAT) 0.4 MG SL tablet Place 0.4 mg under the tongue every 5 (five) minutes as needed for chest pain. Patient not taking: Reported on 07/23/2023    [provider]  olmesartan (BENICAR) 20 MG tablet Take 20 mg by mouth daily. Patient not taking: Reported on 07/23/2023 08/14/22   [provider]  sertraline (ZOLOFT) 50 MG tablet Take 50 mg by mouth at bedtime. Patient not taking: Reported on 07/23/2023    [provider]  SYNTHROID 175 MCG tablet Take 1  tablet (175 mcg total) by mouth daily. NO MORE REFILLS WITHOUT OFFICE VISIT - 2ND NOTICE 11/14/14   Tonye Pearson, MD  thiamine 100 MG tablet Take 1 tablet (100 mg total) by mouth daily. 04/14/22   Leroy Sea, MD  traZODone (DESYREL) 50 MG tablet Take 1 tablet (50 mg total) by mouth at bedtime. Patient not taking: Reported on 07/23/2023 09/15/22   Bing Neighbors, NP  Vitamin D, Ergocalciferol, (DRISDOL) 1.25 MG (50000 UNIT) CAPS capsule Take 50,000 Units by mouth every 7 (seven) days. Patient not taking: Reported on 07/23/2023 05/14/21   [provider]    Allergies:   Allergies  Allergen Reactions   Sulfa Antibiotics Anaphylaxis, Swelling and Other (See Comments)    Mouth and throat swelling   Oxycodone Itching   Nickel Rash and Other (See Comments)    Caused cold sores    Social History:  reports that he has quit smoking. His smoking use included cigars. He has never used smokeless tobacco. He reports current alcohol use. He reports that he does not use drugs.  Family History: Family History  Problem Relation Age of Onset   Heart disease Mother    Cancer Brother        Lung   Lung disease Brother    Stroke Father     Physical Exam: Vitals:   09/24/23 1836  09/24/23 2144 09/24/23 2145  BP: 102/73 96/74 90/63   Pulse: (!) 102 100 (!) 101  Resp: 18 13 18   Temp: (!) 97.5 F (36.4 C) (!) 97.5 F (36.4 C)   TempSrc: Oral Oral   SpO2: 100%  96%    General:  A&O x3, mildly tremulous, no acute distress Eyes: Pale conjunctiva, mild scleral icterus ENT: Moist oral mucosa, neck supple, no thyromegaly Lungs: CTA B/L, no wheeze, no crackles, no use of accessory muscles Cardiovascular: Tachycardia, RRR, no murmurs, no JVD Abdomen: soft, positive BS, NTND, not an acute abdomen GU: not examined Neuro: CN II - XII grossly intact Musculoskeletal: strength 5/5 all extremities, no edema Skin: no rash, no subcutaneous crepitation, no decubitus Psych: appropriate patient   Labs on Admission:  Recent Labs    09/24/23 1930 09/24/23 1945  NA 138 139  K 2.7* 2.7*  CL 96* 96*  CO2  --  27  GLUCOSE 76 75  BUN 10 11  CREATININE 0.80 0.95  CALCIUM  --  8.2*  MG  --  1.2*   Recent Labs    09/24/23 1945  AST 49*  ALT 23  ALKPHOS 195*  BILITOT 2.1*  PROT 5.6*  ALBUMIN 3.0*    Recent Labs    09/24/23 1930 09/24/23 1945  WBC  --  13.8*  NEUTROABS  --  11.0*  HGB 6.5* 5.9*  HCT 19.0* 18.5*  MCV  --  115.6*  PLT  --  49*    Recent Labs    09/24/23 2110  RETICCTPCT 3.8*    Radiological Exams on Admission: CT Head Wo Contrast  Result Date: 09/24/2023 CLINICAL DATA:  Fall, dizziness, headache. EXAM: CT HEAD WITHOUT CONTRAST TECHNIQUE: Contiguous axial images were obtained from the base of the skull through the vertex without intravenous contrast. RADIATION DOSE REDUCTION: This exam was performed according to the departmental dose-optimization program which includes automated exposure control, adjustment of the mA and/or kV according to patient size and/or use of iterative reconstruction technique. COMPARISON:  07/23/2023 FINDINGS: Brain: No intracranial hemorrhage, mass effect, or midline shift. Brain volume is normal for  age. No  hydrocephalus. The basilar cisterns are patent. Minor periventricular chronic small vessel ischemia. No evidence of territorial infarct or acute ischemia. No extra-axial or intracranial fluid collection. Vascular: No hyperdense vessel or unexpected calcification. Skull: No fracture or focal lesion. Sinuses/Orbits: No acute findings. Partial opacification of left mastoid air cells, unchanged. Other: None. IMPRESSION: 1. No acute intracranial abnormality. No skull fracture. 2. Minor chronic small vessel ischemia. Electronically Signed   By: Narda Rutherford M.D.   On: 09/24/2023 21:36   DG Chest Portable 1 View  Result Date: 09/24/2023 CLINICAL DATA:  Chest pain and weakness. EXAM: PORTABLE CHEST 1 VIEW COMPARISON:  07/23/2023 FINDINGS: Low lung volumes persist. Normal heart size with unchanged mediastinal contours. No focal airspace disease. No pleural effusion or pneumothorax. No pulmonary edema. IMPRESSION: Low lung volumes without acute chest finding. Electronically Signed   By: Narda Rutherford M.D.   On: 09/24/2023 21:32    Assessment/Plan Present on Admission:  Acute on chronic anemia, symptomatic //  Syncope and collapse -Patient severe anemia likely cause of patient's syncope and collapse -GI consult placed by epic.  Dr. Dulce Sellar -Transfused 1 unit in ER. Will add an additional 2 units given chest pain, syncope and elevated troponin. Goal hgb ~10 -NPO, IVF -Serial H&H -IV Protonix every 12.  No evidence of variceal type bleeding   Megaloblastic anemia -Folic acid deficiency.  Folic acid ordered, first dose now   Hypokalemia// hypomagnesemia// hypophosphatemia -Repleting IV -Repeat levels in the a.m.  Elevated troponin -Likely secondary to patient' severe and prolonged anemia -2D echo in a.m. -Defer to a.m. team if cardiology consult needed.  Alcohol abuse -CIWA protocol ordered   Chronic thrombocytopenia (HCC) -Likely related to alcohol abuse and hepatic dysfunction.  No recorded  liver cirrhosis.  Will order a right upper quadrant ultrasound   Chronic hypotension -Patient maintained on midodrine.  Patient currently hypotensive but asymptomatic.  Transfusing and reordering midodrine, first dose now   Hereditary hemochromatosis (HCC) -Per oncology   Hypothyroidism -Synthroid resumed   Addendum -Called by nursing, patient fell.  Reports hitting bilateral elbows, bilateral knees hyper flexion bilateral ankle joints.  Patient states he hit the left side of his face on the sink.  He denies loss of consciousness.  Per patient he is dizzy.  Patient denies neck pain.  No neck pain with palpation.  Bilateral wrist normal full range of motion, no swelling.  Bilateral lateral ankles slightly swollen with tenderness when palpated.  Knees appear stable, with good range of motion.  Some tenderness when palpated.  X-ray ankles and knees ordered.  CT head added   Nyashia Raney 09/24/2023, 9:57 PM

## 2023-09-24 NOTE — ED Provider Notes (Signed)
[place] EMERGENCY DEPARTMENT AT [facility] Provider Note   CSN: [id number] Arrival date & time: 09/24/23  1823     History  Chief Complaint  Patient presents with   Philip Humphrey is a 62 y.o. male.  HPI 62 year old male presents for falls and dizziness.  He is overall not a great historian.  He states he has been falling due to dizziness which can be both room spinning and lightheadedness, for about a year.  However its gotten worse over the last several weeks.  He states he has not had any specific injury though his chronic leg pain does seem to be acutely worse.  He has been having headaches for a year.  Today he has been having waxing and waning blurry vision, left greater than right eye.  This is diffuse across his vision.  He has been having some on and off chest pain since yesterday.  He states he is had some blood in the urine and some black urine but denies any blood in the stools or black stools. He feels all over weak but no focal weakness.  Home Medications Prior to Admission medications   Medication Sig Start Date End Date Taking? Authorizing Provider  atorvastatin (LIPITOR) 80 MG tablet Take 80 mg by mouth daily. Patient not taking: Reported on 07/23/2023 10/09/16   [provider]  bismuth subsalicylate (PEPTO BISMOL) 262 MG/15ML suspension Take 30 mLs by mouth every 6 (six) hours as needed for indigestion (stomach pain).    [provider]  calcium carbonate (TUMS - DOSED IN MG ELEMENTAL CALCIUM) 500 MG chewable tablet Chew 2 tablets by mouth at bedtime as needed for indigestion or heartburn.    [provider]  Cyanocobalamin (B-12) 1000 MCG SUBL Place 1,000 mcg under the tongue daily. 07/26/22   Briant Cedar, PA-C  diclofenac Sodium (VOLTAREN) 1 % GEL Apply 1 application topically 2 (two) times daily as needed (pain).    [provider]  diphenhydramine-acetaminophen (TYLENOL PM) 25-500 MG TABS tablet Take 2-3  tablets by mouth at bedtime as needed (for sleep).    [provider]  famotidine (PEPCID) 40 MG tablet Take 40 mg by mouth daily as needed for heartburn or indigestion. Patient not taking: Reported on 07/23/2023 01/25/21   [provider]  folic acid (FOLVITE) 1 MG tablet Take 1 tablet (1 mg total) by mouth daily. Patient not taking: Reported on 07/23/2023 04/14/22   Leroy Sea, MD  gabapentin (NEURONTIN) 300 MG capsule Take 300-600 mg by mouth 2 (two) times daily. Take 1 capsule (300 mg) in the morning and Take 2 capsules (600 mg) at bedtime Patient not taking: Reported on 07/23/2023    [provider]  hydrOXYzine (ATARAX/VISTARIL) 25 MG tablet Take 25 mg by mouth every 4 (four) hours as needed for anxiety. For itching Patient not taking: Reported on 07/23/2023 06/17/21   [provider]  lisinopril (ZESTRIL) 20 MG tablet Take 20 mg by mouth daily. Patient not taking: Reported on 07/23/2023 08/02/22   [provider]  Magnesium 400 MG TABS Take 400 mg by mouth daily.    [provider]  MELATONIN PO Take 1 tablet by mouth at bedtime as needed (sleep).    [provider]  midodrine (PROAMATINE) 2.5 MG tablet Take 5 mg by mouth 3 (three) times daily. Patient not taking: Reported on 07/23/2023 09/03/22   [provider]  nitroGLYCERIN (NITROSTAT) 0.4 MG SL tablet Place  0.4 mg under the tongue every 5 (five) minutes as needed for chest pain. Patient not taking: Reported on 07/23/2023    [provider]  olmesartan (BENICAR) 20 MG tablet Take 20 mg by mouth daily. Patient not taking: Reported on 07/23/2023 08/14/22   [provider]  sertraline (ZOLOFT) 50 MG tablet Take 50 mg by mouth at bedtime. Patient not taking: Reported on 07/23/2023    [provider]  SYNTHROID 175 MCG tablet Take 1 tablet (175 mcg total) by mouth daily. NO MORE REFILLS WITHOUT OFFICE VISIT - 2ND NOTICE 11/14/14   Tonye Pearson, MD   thiamine 100 MG tablet Take 1 tablet (100 mg total) by mouth daily. 04/14/22   Leroy Sea, MD  traZODone (DESYREL) 50 MG tablet Take 1 tablet (50 mg total) by mouth at bedtime. Patient not taking: Reported on 07/23/2023 09/15/22   Bing Neighbors, NP  Vitamin D, Ergocalciferol, (DRISDOL) 1.25 MG (50000 UNIT) CAPS capsule Take 50,000 Units by mouth every 7 (seven) days. Patient not taking: Reported on 07/23/2023 05/14/21   [provider]      Allergies    Sulfa antibiotics, Oxycodone, and Nickel    Review of Systems   Review of Systems  Cardiovascular:  Positive for chest pain.  Gastrointestinal:  Negative for abdominal pain and blood in stool.  Genitourinary:  Positive for hematuria. Negative for dysuria.  Neurological:  Positive for weakness and headaches.    Physical Exam Updated Vital Signs BP (!) 85/55   Pulse 100   Temp (!) 97.5 F (36.4 C) (Oral)   Resp 19   SpO2 96%  Physical Exam Vitals and nursing note reviewed.  Constitutional:      Appearance: He is well-developed. He is not diaphoretic.  HENT:     Head: Normocephalic and atraumatic.  Eyes:     Extraocular Movements: Extraocular movements intact.     Pupils: Pupils are equal, round, and reactive to light.     Comments: Pale conjunctivae  Cardiovascular:     Rate and Rhythm: Regular rhythm. Tachycardia present.     Heart sounds: Normal heart sounds.     Comments: HR low 100s Pulmonary:     Effort: Pulmonary effort is normal.     Breath sounds: Normal breath sounds.  Abdominal:     General: There is no distension.     Palpations: Abdomen is soft.     Tenderness: There is no abdominal tenderness.  Genitourinary:    Comments: Brown stool on DRE (slightly hemoccult positive on testing). No gross blood or melena. Musculoskeletal:     Cervical back: No rigidity.     Comments: No tenderness to either leg, including hip, thigh, knee.  Skin:    General: Skin is warm and dry.     Coloration: Skin  is pale.  Neurological:     Mental Status: He is alert.     Comments: CN 3-12 grossly intact. Equal but weak strength in all 4 extremities. Grossly normal sensation. Normal finger to nose.      ED Results / Procedures / Treatments   Labs (all labs ordered are listed, but only abnormal results are displayed) Labs Reviewed  CBC WITH DIFFERENTIAL/PLATELET - Abnormal; Notable for the following components:      Result Value   WBC 13.8 (*)    RBC 1.60 (*)    Hemoglobin 5.9 (*)    HCT 18.5 (*)    MCV 115.6 (*)    MCH 36.9 (*)  RDW 20.5 (*)    Platelets 49 (*)    nRBC 0.4 (*)    Neutro Abs 11.0 (*)    Abs Immature Granulocytes 0.21 (*)    All other components within normal limits  COMPREHENSIVE METABOLIC PANEL - Abnormal; Notable for the following components:   Potassium 2.7 (*)    Chloride 96 (*)    Calcium 8.2 (*)    Total Protein 5.6 (*)    Albumin 3.0 (*)    AST 49 (*)    Alkaline Phosphatase 195 (*)    Total Bilirubin 2.1 (*)    Anion gap 16 (*)    All other components within normal limits  MAGNESIUM - Abnormal; Notable for the following components:   Magnesium 1.2 (*)    All other components within normal limits  PROTIME-INR - Abnormal; Notable for the following components:   Prothrombin Time 16.2 (*)    INR 1.3 (*)    All other components within normal limits  FOLATE - Abnormal; Notable for the following components:   Folate 4.3 (*)    All other components within normal limits  IRON AND TIBC - Abnormal; Notable for the following components:   TIBC 167 (*)    Saturation Ratios 40 (*)    All other components within normal limits  RETICULOCYTES - Abnormal; Notable for the following components:   Retic Ct Pct 3.8 (*)    RBC. 1.60 (*)    Immature Retic Fract 17.2 (*)    All other components within normal limits  PHOSPHORUS - Abnormal; Notable for the following components:   Phosphorus 1.4 (*)    All other components within normal limits  I-STAT CHEM 8, ED -  Abnormal; Notable for the following components:   Potassium 2.7 (*)    Chloride 96 (*)    Calcium, Ion 1.03 (*)    Hemoglobin 6.5 (*)    HCT 19.0 (*)    All other components within normal limits  POC OCCULT BLOOD, ED - Abnormal; Notable for the following components:   Fecal Occult Bld POSITIVE (*)    All other components within normal limits  TROPONIN I (HIGH SENSITIVITY) - Abnormal; Notable for the following components:   Troponin I (High Sensitivity) 115 (*)    All other components within normal limits  TROPONIN I (HIGH SENSITIVITY) - Abnormal; Notable for the following components:   Troponin I (High Sensitivity) 117 (*)    All other components within normal limits  ETHANOL  VITAMIN B12  FERRITIN  URINALYSIS, W/ REFLEX TO CULTURE (INFECTION SUSPECTED)  BASIC METABOLIC PANEL  TYPE AND SCREEN  PREPARE RBC (CROSSMATCH)  PREPARE RBC (CROSSMATCH)    EKG EKG Interpretation Date/Time:  Friday September 24 2023 20:11:07 EDT Ventricular Rate:  100 PR Interval:  166 QRS Duration:  91 QT Interval:  378 QTC Calculation: 488 R Axis:   20  Text Interpretation: Sinus tachycardia Low voltage, precordial leads Borderline T wave abnormalities Borderline prolonged QT interval similar to Aug 2023 Confirmed by Pricilla Loveless [phone]) on 09/24/2023 8:22:02 PM  Radiology CT Head Wo Contrast  Result Date: 09/24/2023 CLINICAL DATA:  Fall, dizziness, headache. EXAM: CT HEAD WITHOUT CONTRAST TECHNIQUE: Contiguous axial images were obtained from the base of the skull through the vertex without intravenous contrast. RADIATION DOSE REDUCTION: This exam was performed according to the departmental dose-optimization program which includes automated exposure control, adjustment of the mA and/or kV according to patient size and/or use of iterative reconstruction technique. COMPARISON:  07/23/2023 FINDINGS:  Brain: No intracranial hemorrhage, mass effect, or midline shift. Brain volume is normal for age. No  hydrocephalus. The basilar cisterns are patent. Minor periventricular chronic small vessel ischemia. No evidence of territorial infarct or acute ischemia. No extra-axial or intracranial fluid collection. Vascular: No hyperdense vessel or unexpected calcification. Skull: No fracture or focal lesion. Sinuses/Orbits: No acute findings. Partial opacification of left mastoid air cells, unchanged. Other: None. IMPRESSION: 1. No acute intracranial abnormality. No skull fracture. 2. Minor chronic small vessel ischemia. Electronically Signed   By: Narda Rutherford M.D.   On: 09/24/2023 21:36   DG Chest Portable 1 View  Result Date: 09/24/2023 CLINICAL DATA:  Chest pain and weakness. EXAM: PORTABLE CHEST 1 VIEW COMPARISON:  07/23/2023 FINDINGS: Low lung volumes persist. Normal heart size with unchanged mediastinal contours. No focal airspace disease. No pleural effusion or pneumothorax. No pulmonary edema. IMPRESSION: Low lung volumes without acute chest finding. Electronically Signed   By: Narda Rutherford M.D.   On: 09/24/2023 21:32    Procedures .Critical Care  Performed by: Pricilla Loveless, MD Authorized by: Pricilla Loveless, MD   Critical care provider statement:    Critical care time (minutes):  35   Critical care time was exclusive of:  Separately billable procedures and treating other patients   Critical care was necessary to treat or prevent imminent or life-threatening deterioration of the following conditions:  Circulatory failure and cardiac failure   Critical care was time spent personally by me on the following activities:  Development of treatment plan with patient or surrogate, discussions with consultants, evaluation of patient's response to treatment, examination of patient, ordering and review of laboratory studies, ordering and review of radiographic studies, ordering and performing treatments and interventions, pulse oximetry, re-evaluation of patient's condition and review of old charts      Medications Ordered in ED Medications  potassium chloride 10 mEq in 100 mL IVPB (10 mEq Intravenous New Bag/Given 09/24/23 2241)  cyanocobalamin (VITAMIN B12) injection 1,000 mcg (has no administration in time range)  folic acid (FOLVITE) tablet 1 mg (1 mg Oral Given 09/24/23 2238)  midodrine (PROAMATINE) tablet 5 mg (5 mg Oral Given 09/24/23 2238)  sertraline (ZOLOFT) tablet 50 mg (has no administration in time range)  levothyroxine (SYNTHROID) tablet 175 mcg (has no administration in time range)  0.9 %  sodium chloride infusion (Manually program via Guardrails IV Fluids) (has no administration in time range)  LORazepam (ATIVAN) tablet 1-4 mg (has no administration in time range)    Or  LORazepam (ATIVAN) injection 1-4 mg (has no administration in time range)  thiamine (VITAMIN B1) tablet 100 mg (100 mg Oral Given 09/24/23 2238)    Or  thiamine (VITAMIN B1) injection 100 mg ( Intravenous See Alternative 09/24/23 2238)  acetaminophen (TYLENOL) tablet 650 mg (has no administration in time range)    Or  acetaminophen (TYLENOL) suppository 650 mg (has no administration in time range)  senna-docusate (Senokot-S) tablet 1 tablet (has no administration in time range)  pantoprazole (PROTONIX) injection 40 mg (has no administration in time range)  lactated ringers bolus 1,000 mL (1,000 mLs Intravenous New Bag/Given 09/24/23 1946)  0.9 %  sodium chloride infusion (Manually program via Guardrails IV Fluids) (0 mLs Intravenous Stopped 09/24/23 2143)  potassium chloride SA (KLOR-CON M) CR tablet 40 mEq (40 mEq Oral Given 09/24/23 2120)  pantoprazole (PROTONIX) 80 mg /NS 100 mL IVPB (80 mg Intravenous New Bag/Given 09/24/23 2242)  magnesium sulfate IVPB 2 g 50 mL (0 g Intravenous Stopped  09/24/23 2240)    ED Course/ Medical Decision Making/ A&P                                 Medical Decision Making Amount and/or Complexity of Data Reviewed Labs: ordered.    Details: Troponins elevated 100 1517,  likely secondary to the anemia.  Hemoglobin 5.9. Radiology: ordered and independent interpretation performed.    Details: No head bleed.  No CHF ECG/medicine tests: ordered and independent interpretation performed.    Details: Nonspecific findings  Risk Prescription drug management. Decision regarding hospitalization.   Patient presents with generalized weakness and lightheadedness and falls.  Found to be recurrently anemic as well as hypokalemic.  Given fluids and then started on blood.  Will replete his potassium and magnesium.  I suspect his troponins are secondary to the anemia as they are flat.  He is not having any current active chest pain.  Of note, there was no melena or gross blood on rectal exam and his Hemoccult was minimally positive but is technically positive.  Will give a dose of Protonix.  Discussed with Dr. Joneen Roach for admission.        Final Clinical Impression(s) / ED Diagnoses Final diagnoses:  Symptomatic anemia    Rx / DC Orders ED Discharge Orders     None         Pricilla Loveless, MD 09/24/23 2334

## 2023-09-24 NOTE — ED Triage Notes (Signed)
Pt here from home with c/o multiple falls over the last 2 days , pt states that he got dizzy before he fell along with some chest tightness

## 2023-09-25 ENCOUNTER — Inpatient Hospital Stay (HOSPITAL_COMMUNITY): Payer: Medicare Other

## 2023-09-25 ENCOUNTER — Other Ambulatory Visit: Payer: Self-pay

## 2023-09-25 DIAGNOSIS — D693 Immune thrombocytopenic purpura: Secondary | ICD-10-CM | POA: Diagnosis not present

## 2023-09-25 DIAGNOSIS — D649 Anemia, unspecified: Secondary | ICD-10-CM | POA: Diagnosis not present

## 2023-09-25 DIAGNOSIS — R195 Other fecal abnormalities: Secondary | ICD-10-CM

## 2023-09-25 DIAGNOSIS — E876 Hypokalemia: Secondary | ICD-10-CM | POA: Diagnosis not present

## 2023-09-25 DIAGNOSIS — K709 Alcoholic liver disease, unspecified: Secondary | ICD-10-CM | POA: Diagnosis not present

## 2023-09-25 LAB — CBC
HCT: 30.5 % — ABNORMAL LOW (ref 39.0–52.0)
Hemoglobin: 10.3 g/dL — ABNORMAL LOW (ref 13.0–17.0)
MCH: 32.6 pg (ref 26.0–34.0)
MCHC: 33.8 g/dL (ref 30.0–36.0)
MCV: 96.5 fL (ref 80.0–100.0)
Platelets: 45 10*3/uL — ABNORMAL LOW (ref 150–400)
RBC: 3.16 MIL/uL — ABNORMAL LOW (ref 4.22–5.81)
RDW: 23 % — ABNORMAL HIGH (ref 11.5–15.5)
WBC: 16.2 10*3/uL — ABNORMAL HIGH (ref 4.0–10.5)
nRBC: 0.2 % (ref 0.0–0.2)

## 2023-09-25 LAB — BASIC METABOLIC PANEL
Anion gap: 9 (ref 5–15)
BUN: 8 mg/dL (ref 8–23)
CO2: 27 mmol/L (ref 22–32)
Calcium: 7.8 mg/dL — ABNORMAL LOW (ref 8.9–10.3)
Chloride: 100 mmol/L (ref 98–111)
Creatinine, Ser: 0.73 mg/dL (ref 0.61–1.24)
GFR, Estimated: >60 mL/min (ref >60)
Glucose, Bld: 96 mg/dL (ref 70–99)
Potassium: 3.1 mmol/L — ABNORMAL LOW (ref 3.5–5.1)
Sodium: 136 mmol/L (ref 135–145)

## 2023-09-25 LAB — MRSA NEXT GEN BY PCR, NASAL: MRSA by PCR Next Gen: NOT DETECTED

## 2023-09-25 LAB — PHOSPHORUS: Phosphorus: 1.4 mg/dL — ABNORMAL LOW (ref 2.5–4.6)

## 2023-09-25 LAB — HEPATITIS B SURFACE ANTIGEN: Hepatitis B Surface Ag: NONREACTIVE

## 2023-09-25 LAB — HEPATITIS B SURFACE ANTIBODY,QUALITATIVE: Hep B S Ab: NONREACTIVE

## 2023-09-25 LAB — HEPATITIS C ANTIBODY: HCV Ab: NONREACTIVE

## 2023-09-25 LAB — MAGNESIUM: Magnesium: 1.6 mg/dL — ABNORMAL LOW (ref 1.7–2.4)

## 2023-09-25 LAB — HEPATITIS A ANTIBODY, TOTAL: hep A Total Ab: NONREACTIVE

## 2023-09-25 MED ORDER — K PHOS MONO-SOD PHOS DI & MONO 155-852-130 MG PO TABS
500.0000 mg | ORAL_TABLET | Freq: Three times a day (TID) | ORAL | Status: AC
  Administered 2023-09-25 – 2023-09-26 (×5): 500 mg via ORAL
  Filled 2023-09-25 (×5): qty 2

## 2023-09-25 MED ORDER — CHLORDIAZEPOXIDE HCL 5 MG PO CAPS
5.0000 mg | ORAL_CAPSULE | Freq: Four times a day (QID) | ORAL | Status: DC
  Administered 2023-09-25 – 2023-09-27 (×5): 5 mg via ORAL
  Filled 2023-09-25 (×6): qty 1

## 2023-09-25 MED ORDER — CIPROFLOXACIN HCL 0.3 % OP SOLN
2.0000 [drp] | OPHTHALMIC | Status: AC
  Administered 2023-09-25 – 2023-10-02 (×53): 2 [drp] via OPHTHALMIC
  Filled 2023-09-25 (×3): qty 2.5

## 2023-09-25 MED ORDER — CHLORDIAZEPOXIDE HCL 5 MG PO CAPS
10.0000 mg | ORAL_CAPSULE | Freq: Three times a day (TID) | ORAL | Status: DC
Start: 2023-09-25 — End: 2023-09-25

## 2023-09-25 MED ORDER — PHYTONADIONE 5 MG PO TABS
10.0000 mg | ORAL_TABLET | Freq: Once | ORAL | Status: AC
  Administered 2023-09-25: 10 mg via ORAL
  Filled 2023-09-25: qty 2

## 2023-09-25 MED ORDER — POTASSIUM PHOSPHATES 15 MMOLE/5ML IV SOLN
30.0000 mmol | Freq: Once | INTRAVENOUS | Status: AC
  Administered 2023-09-25: 30 mmol via INTRAVENOUS
  Filled 2023-09-25: qty 10

## 2023-09-25 MED ORDER — HYDROMORPHONE HCL 1 MG/ML IJ SOLN
0.5000 mg | Freq: Once | INTRAMUSCULAR | Status: DC
Start: 2023-09-25 — End: 2023-09-25

## 2023-09-25 MED ORDER — MAGNESIUM SULFATE 4 GM/100ML IV SOLN
4.0000 g | Freq: Once | INTRAVENOUS | Status: AC
  Administered 2023-09-25: 4 g via INTRAVENOUS
  Filled 2023-09-25: qty 100

## 2023-09-25 MED ORDER — POTASSIUM CHLORIDE CRYS ER 20 MEQ PO TBCR
40.0000 meq | EXTENDED_RELEASE_TABLET | Freq: Four times a day (QID) | ORAL | Status: AC
  Administered 2023-09-25 (×2): 40 meq via ORAL
  Filled 2023-09-25 (×2): qty 2

## 2023-09-25 MED ORDER — KETOROLAC TROMETHAMINE 10 MG PO TABS: 10.0000 mg | ORAL_TABLET | Freq: Once | ORAL | Status: DC

## 2023-09-25 NOTE — Progress Notes (Signed)
   09/25/23 0340  What Happened  Was fall witnessed? Yes  Who witnessed fall? Philip Humphrey  Patients activity before fall bathroom-assisted  Point of contact other (comment) (Left elbow and left temple/jaw)  Was patient injured? Unsure (elbow had pre-existing scabs that got knocked off with impact.  No bruising or redness on left jaw/temple.)  Provider Notification  Provider Name/Title Joneen Roach, MD, Arlean Hopping, MD  Date Provider Notified 09/25/23  Time Provider Notified 0400  Method of Notification Page  Notification Reason Fall  Provider response At bedside;See new orders  Date of Provider Response 09/25/23  Time of Provider Response 0430  Follow Up  Family notified  (pt said nothing tobe notified; confused)  Additional tests Yes-comment  Progress note created (see row info) Yes  Adult Fall Risk Assessment  Risk Factor Category (scoring not indicated) Fall has occurred during this admission (document High fall risk);History of more than one fall within 6 months before admission (document High fall risk)  Patient Fall Risk Level High fall risk  Adult Fall Risk Interventions  Required Bundle Interventions *See Row Information* High fall risk - low, moderate, and high requirements implemented  Additional Interventions Use of appropriate toileting equipment (bedpan, BSC, etc.)  Screening for Fall Injury Risk (To be completed on HIGH fall risk patients) - Assessing Need for Floor Mats  Risk For Fall Injury- Criteria for Floor Mats Admitted as a result of a fall;Previous fall this admission  Will Implement Floor Mats Yes  Vitals  Temp 98.5 F (36.9 C)  Temp Source Oral  BP 92/63  MAP (mmHg) 73  BP Location Left Arm  BP Method Automatic  Patient Position (if appropriate) Lying  Pulse Rate 94  Pulse Rate Source Monitor  ECG Heart Rate 95  Cardiac Rhythm NSR  Resp 20  Oxygen Therapy  SpO2 99 %  O2 Device Room Air  Pain Assessment  Pain Scale 0-10  Pain Score 5  Pain Type  Acute pain  Pain Location Ankle  Pain Orientation Right;Left  Pain Onset Other (Comment) (post fall)  Pain Intervention(s) MD notified (Comment)  Neurological  Neuro (WDL) X  Level of Consciousness Alert  Orientation Level Oriented to person;Oriented to place;Oriented to situation;Disoriented to time  Cognition Follows commands  Speech Clear  Neuro Symptoms Forgetful;Fatigue  Neuro symptoms relieved by Rest  Glasgow Coma Scale  Eye Opening 4  Best Verbal Response (NON-intubated) 4  Best Motor Response 6  Glasgow Coma Scale Score 14  Musculoskeletal  Musculoskeletal (WDL) X  Assistive Device Other (Comment) (independent as baseline but will put on bedrest for now)  Generalized Weakness Yes  Musculoskeletal Details  RUE Full movement  LUE Full movement  RLE Full movement;Weakness  LLE Full movement;Weakness  Integumentary  Integumentary (WDL) X  Skin Color Pale  Skin Condition Dry;Flaky  Skin Integrity Erythema/redness;Abrasion  Abrasion Location Leg;Arm  Abrasion Location Orientation Left  Abrasion Intervention Cleansed;Foam  Erythema/Redness Location Arm;Hip  Erythema/Redness Location Orientation Left;Right  Skin Turgor Non-tenting  Pain Assessment  Date Pain First Started 09/25/23  Result of Injury Yes

## 2023-09-25 NOTE — Evaluation (Signed)
Physical Therapy Evaluation Patient Details Name: Philip Humphrey MRN: [id number] DOB: Dec 27, 1960 Today's Date: 09/25/2023  History of Present Illness  62 y.o. male presents to [facility] hospital on 09/24/2023 after syncopal episode with chest pain. Pt found to be anemic. PMH includes: hemochromatosis, B12 deficiency, HTN, alcohol abuse, hx of leaving AMA, neuropathy, R THA.  Clinical Impression  Pt presents to PT with deficits in functional mobility, gait, balance, endurance. Pt requires max encouragement to participate in PT evaluation, with prolonged time between positional changes. Pt requires assistance to ascend into standing and refuses attempts at ambulation after arrival of his dinner. Pt reports dizziness twice during session, once spontaneously without change in position of body or head prior, and a 2nd time after standing up. Per RN the pt has required assistance to transfer but has been able to ambulate. Based on this evaluation pt may benefit from short term inpatient PT services at the time of discharge.      If plan is discharge home, recommend the following: A lot of help with walking and/or transfers;A lot of help with bathing/dressing/bathroom;Assistance with cooking/housework;Direct supervision/assist for financial management;Direct supervision/assist for medications management;Assist for transportation;Help with stairs or ramp for entrance;Supervision due to cognitive status   Can travel by private vehicle   No    Equipment Recommendations Wheelchair (measurements PT);Wheelchair cushion (measurements PT)  Recommendations for Other Services       Functional Status Assessment Patient has had a recent decline in their functional status and demonstrates the ability to make significant improvements in function in a reasonable and predictable amount of time.     Precautions / Restrictions Precautions Precautions: Fall Precaution Comments: history of dizziness as well as  orthostasis. Pt does not mobilize quickly enough this session for accurate assessment of orthostatic BP. Dizziness during session appears unrelated to changes in head position Restrictions Weight Bearing Restrictions: No      Mobility  Bed Mobility Overal bed mobility: Needs Assistance Bed Mobility: Supine to Sit, Sit to Supine     Supine to sit: Supervision Sit to supine: Contact guard assist, HOB elevated        Transfers Overall transfer level: Needs assistance Equipment used: Rolling walker (2 wheels) Transfers: Sit to/from Stand Sit to Stand: Mod assist           General transfer comment: assist to power up into standing due to LE weakness. Pt requires max encouragement to participate in transfers    Ambulation/Gait Ambulation/Gait assistance:  (pt refuses attempts at ambulation due to arrival of his food)                Stairs            Wheelchair Mobility     Tilt Bed    Modified Rankin (Stroke Patients Only)       Balance Overall balance assessment: Needs assistance Sitting-balance support: No upper extremity supported Sitting balance-Leahy Scale: Fair     Standing balance support: Bilateral upper extremity supported Standing balance-Leahy Scale: Poor Standing balance comment: minA with BUE support of RW                             Pertinent Vitals/Pain Pain Assessment Pain Assessment: No/denies pain    Home Living Family/patient expects to be discharged to:: Private residence Living Arrangements: Alone Available Help at Discharge: Family;Available PRN/intermittently Type of Home: House Home Access: Stairs to enter Entrance Stairs-Rails: Left Entrance Stairs-Number of Steps:  5   Home Layout: One level Home Equipment: Agricultural consultant (2 wheels);Cane - single point;Tub bench;Grab bars - toilet      Prior Function Prior Level of Function : Independent/Modified Independent;History of Falls (last six months)              Mobility Comments: pt reports ambulating with RW vs SPC at baseline, extensive history of falls in recent years       Extremity/Trunk Assessment   Upper Extremity Assessment Upper Extremity Assessment: Generalized weakness    Lower Extremity Assessment Lower Extremity Assessment: Generalized weakness    Cervical / Trunk Assessment Cervical / Trunk Assessment: Normal  Communication   Communication Communication: No apparent difficulties Cueing Techniques: Verbal cues;Tactile cues  Cognition Arousal: Alert Behavior During Therapy: Flat affect Overall Cognitive Status: Impaired/Different from baseline Area of Impairment: Orientation, Attention, Memory, Following commands, Safety/judgement, Problem solving, Awareness                 Orientation Level: Disoriented to, Time Current Attention Level: Sustained Memory: Decreased recall of precautions, Decreased short-term memory Following Commands: Follows one step commands with increased time, Follows multi-step commands inconsistently Safety/Judgement: Decreased awareness of safety, Decreased awareness of deficits Awareness: Emergent Problem Solving: Slow processing, Decreased initiation, Difficulty sequencing, Requires verbal cues          General Comments General comments (skin integrity, edema, etc.): BP pre-mobility 102/75 in supine. Pt reports spontaneous onset of dizziness in sitting (pt had been sitting in the same position for 10+ minutes prior to onset of symptoms). He is unable to provide descriptors for his symptoms and instead just answers yes to all descriptors mentioned by this PT. Pt later reports dizziness after standing, which remains after transitioning to supine. PT notes no nystagmus throughout session.    Exercises     Assessment/Plan    PT Assessment Patient needs continued PT services  PT Problem List Decreased strength;Decreased activity tolerance;Decreased balance;Decreased  mobility;Decreased cognition;Decreased knowledge of use of DME;Decreased safety awareness;Decreased knowledge of precautions       PT Treatment Interventions DME instruction;Gait training;Stair training;Functional mobility training;Therapeutic activities;Therapeutic exercise;Balance training;Neuromuscular re-education;Cognitive remediation;Patient/family education;Wheelchair mobility training    PT Goals (Current goals can be found in the Care Plan section)  Acute Rehab PT Goals Patient Stated Goal: to stop falling PT Goal Formulation: With patient Time For Goal Achievement: 10/09/23 Potential to Achieve Goals: Fair    Frequency Min 1X/week     Co-evaluation               AM-PAC PT "6 Clicks" Mobility  Outcome Measure Help needed turning from your back to your side while in a flat bed without using bedrails?: A Little Help needed moving from lying on your back to sitting on the side of a flat bed without using bedrails?: A Little Help needed moving to and from a bed to a chair (including a wheelchair)?: A Lot Help needed standing up from a chair using your arms (e.g., wheelchair or bedside chair)?: A Lot Help needed to walk in hospital room?: Total Help needed climbing 3-5 steps with a railing? : Total 6 Click Score: 12    End of Session Equipment Utilized During Treatment: Gait belt Activity Tolerance: Other (comment) (limited by pt refusal to participate in ambulation as well as max encouragement needed for participation in transfers) Patient left: in bed;with call bell/phone within reach;with bed alarm set Nurse Communication: Mobility status;Need for lift equipment PT Visit Diagnosis: Other abnormalities of gait and mobility (  R26.89);Muscle weakness (generalized) (M62.81)    Time: 4540-9811 PT Time Calculation (min) (ACUTE ONLY): 38 min   Charges:   PT Evaluation $PT Eval Low Complexity: 1 Low   PT General Charges $$ ACUTE PT VISIT: 1 Visit         Arlyss Gandy, PT, DPT Acute Rehabilitation Office [phone]   Arlyss Gandy 09/25/2023, 5:41 PM

## 2023-09-25 NOTE — Consult Note (Addendum)
Consultation Note  Referring Provider:  [facility] PCP: Courtney Paris, NP Primary Gastroenterologist: Gentry Fitz        Reason for Consultation: Heme + anemia  DOA: 09/24/2023         Hospital Day: 2   ASSESSMENT    Brief Narrative:  62 y.o. year old male with a history of Etoh abuse., GERD, hepatic steatosis, hereditary hemochromatosis, history of B12 and folate deficiency, diverticulosis, colon polyps, excision of large anal condyloma, HTN, thyroidectomy,   Acute on chronic macrocytic anemia / FOBT+. Hgb 5.9 on admission, down from 8.4 in Aug.  Anemia likely multifactorial ( folate deficiency, possibly bone marrow suppression from etoh abuse and maybe chronic GI blood loss). B12 is normal, folate is low.  Ferritin normal ,TIBC low with elevated iron saturation but Iron studies may not be reliable in setting of etoh abuse and also hereditary hemochromatosis.    *Today Hgb has improved to 10.3 post 3 u PRBCs.   Hereditary hemochromatosis  Remote history of colon polyps ( Dr. Kinnie Scales). No polyps on last colonoscopy in 2016 by Dr. Ovidio Kin  Etoh abuse / elevated liver chemistries. No evidence for cirrhosis on RUQ [place] or recent CT scan.   MDF doesn't meet criteria for steroids. Unclear when last drink was. He tells me 5 days ago but apparently told admitting provider yesterday it was 2 days ago. Getting librium    Mild coagulopathy, possibly Vit K deficiency   Electrolyte abnormalities with low phosphorus hypokalemia, low Mg+,  hypocalcemia  Elevated troponin, chest pain.  Initial EKG- NSR without acute ischemic changes.     PLAN:   --Electrolyte repletion in progress --Will check labs for chronic HCV, HBV and also immunity to HAV, HBV --Will give Vitamin K 10 mg today, check am INR --Folate repletion per [facility] --He needs EGD / colonoscopy at some point for evaluation of anemia and also reported episode of rectal bleeding.   Need to await correction of electrolyte imbalances and also make sure ok from cardiac standpoint. Also  concerned he could have Etoh withdrawal as he has some mild confusion though could be med related. I don't know that he will agree to a bowel prep. Wants tablets instead of liquid prep.  --Will reevaluate tomorrow.  --Continue Protonix PO Q 12 hours --Permanent abstinence from Etoh  HPI   Patient was seen in the hospital by [facility] for symptomatic anemia in Aug.  EGD and colonoscopy recommended but didn't drink bowel prep. Declined procedures. He didn't have oupatient GI follow up  Christion tells me he had 1 episode of rectal bleeding last week.  Other than that he reportedly has not had any blood in his stools.  He denies nausea.  He endorses some chest pain that radiates across to his chest.  He gives a history of reflux but does not take the famotidine every day because he is already on so many medications.  He tells me that he drinks " a lot" and has done so for years.  He reports that his last drink was 5 days ago  LFTs on admission Total bilirubin 2.1, alkaline phosphatase 195, AST 49 ALT 23  Previous GI Evaluations   Feb 2016 colonoscopy for history of colon  polyps - Dr. Ovidio Kin -The flexible Pentax colonoscope was passed up the rectum without difficulty.  The scope was advanced to the cecum and the ileocecal valve was identified.  The colonic prep was good.  He has some stool stuck on the wall of the distal 15 cm/rectum, but this washed away easily.   The right colon, transverse colon, left colon, and sigmoid colon were unremarkable.  There was some hypertrophy of the sigmoid colon wall, but nothing else remarkable. The software for taking pictures was not working.  So I could not take photos.   Labs and Imaging: Recent Labs    09/24/23 1930 09/24/23 1945 09/25/23 0946  WBC  --  13.8* 16.2*  HGB 6.5* 5.9* 10.3*  HCT 19.0* 18.5* 30.5*  PLT  --  49* 45*   Recent Labs     09/24/23 1930 09/24/23 1945 09/25/23 0946  NA 138 139 136  K 2.7* 2.7* 3.1*  CL 96* 96* 100  CO2  --  27 27  GLUCOSE 76 75 96  BUN 10 11 8   CREATININE 0.80 0.95 0.73  CALCIUM  --  8.2* 7.8*   Recent Labs    09/24/23 1945  PROT 5.6*  ALBUMIN 3.0*  AST 49*  ALT 23  ALKPHOS 195*  BILITOT 2.1*   No results for input(s): "HEPBSAG", "HCVAB", "HEPAIGM", "HEPBIGM" in the last 72 hours. Recent Labs    09/24/23 1943  LABPROT 16.2*  INR 1.3*    Past Medical History:  Diagnosis Date   Allergy    occ. seasonal   Anxiety    Arthritis    Colon polyp    Condyloma    anal   Depression    GERD (gastroesophageal reflux disease)    History of transfusion    as child   Hypertension    Knee injury    right knee cap w piece broken off- MRI done 02-06-15-pending surgery. 11-18-16 right knee is still painful, both shoulders(limited ROM right).   Knee pain    Neuropathy    bilateral- greater left   Palpitations    normal stress test 10 yrs ago   Pneumonia    Thyroid disease    Thyroid removed unintentionally at age 29- no further thyroid tissue remains-uses daily supplement    Past Surgical History:  Procedure Laterality Date   COLONOSCOPY     COLONOSCOPY N/A 02/15/2015   Procedure: COLONOSCOPY;  Surgeon: Ovidio Kin, MD;  Location: [facility];  Service: General;  Laterality: N/A;   cyst removed     from neck / abdomed   EXAMINATION UNDER ANESTHESIA N/A 02/15/2015   Procedure: EXAM UNDER ANESTHESIA;  Surgeon: Valarie Merino, MD;  Location: [facility];  Service: General;  Laterality: N/A;   HERNIA REPAIR Left    10 yrs ago   KNEE ARTHROSCOPY Left    x1 and meniscal tear    ROTATOR CUFF REPAIR Right    x1   SHOULDER ARTHROSCOPY W/ ROTATOR CUFF REPAIR Left    THYROIDECTOMY  age 34    TOTAL HIP ARTHROPLASTY Left 11/24/2016   Procedure: LEFT TOTAL HIP ARTHROPLASTY ANTERIOR APPROACH;  Surgeon: [name], MD;  Location: [facility];  Service: Orthopedics;  Laterality: Left;   TOTAL HIP  ARTHROPLASTY     Right hip  Dr. Charlann Boxer 08/03/17   TOTAL HIP ARTHROPLASTY Right 08/03/2017   Procedure: RIGHT TOTAL HIP ARTHROPLASTY ANTERIOR APPROACH;  Surgeon: [name], MD;  Location: [facility];  Service: Orthopedics;  Laterality: Right;  70 mins   UMBILICAL HERNIA REPAIR N/A 10/18/2017   Procedure: Umbilical hernia repair  and excision of lipomas;  Surgeon: Luretha Murphy, MD;  Location: [facility];  Service: General;  Laterality: N/A;   WART FULGURATION N/A 02/15/2015   Procedure: REMOVAL OF ANAL TAGS,CONDYLOMA;  Surgeon: Valarie Merino, MD;  Location: [facility];  Service: General;  Laterality: N/A;   WRIST SURGERY     x2- left (repair tendon/ ligament)    Family History  Problem Relation Age of Onset   Heart disease Mother    Cancer Brother        Lung   Lung disease Brother    Stroke Father     Prior to Admission medications   Medication Sig Start Date End Date Taking? Authorizing Provider  atorvastatin (LIPITOR) 80 MG tablet Take 80 mg by mouth daily. Patient not taking: Reported on 07/23/2023 10/09/16   [provider]  bismuth subsalicylate (PEPTO BISMOL) 262 MG/15ML suspension Take 30 mLs by mouth every 6 (six) hours as needed for indigestion (stomach pain).    [provider]  calcium carbonate (TUMS - DOSED IN MG ELEMENTAL CALCIUM) 500 MG chewable tablet Chew 2 tablets by mouth at bedtime as needed for indigestion or heartburn.    [provider]  Cyanocobalamin (B-12) 1000 MCG SUBL Place 1,000 mcg under the tongue daily. 07/26/22   Briant Cedar, PA-C  diclofenac Sodium (VOLTAREN) 1 % GEL Apply 1 application topically 2 (two) times daily as needed (pain).    [provider]  diphenhydramine-acetaminophen (TYLENOL PM) 25-500 MG TABS tablet Take 2-3 tablets by mouth at bedtime as needed (for sleep).    [provider]  famotidine (PEPCID) 40 MG tablet Take 40 mg by mouth daily as needed for heartburn or indigestion. Patient not taking:  Reported on 07/23/2023 01/25/21   [provider]  folic acid (FOLVITE) 1 MG tablet Take 1 tablet (1 mg total) by mouth daily. Patient not taking: Reported on 07/23/2023 04/14/22   Leroy Sea, MD  gabapentin (NEURONTIN) 300 MG capsule Take 300-600 mg by mouth 2 (two) times daily. Take 1 capsule (300 mg) in the morning and Take 2 capsules (600 mg) at bedtime Patient not taking: Reported on 07/23/2023    [provider]  hydrOXYzine (ATARAX/VISTARIL) 25 MG tablet Take 25 mg by mouth every 4 (four) hours as needed for anxiety. For itching Patient not taking: Reported on 07/23/2023 06/17/21   [provider]  lisinopril (ZESTRIL) 20 MG tablet Take 20 mg by mouth daily. Patient not taking: Reported on 07/23/2023 08/02/22   [provider]  Magnesium 400 MG TABS Take 400 mg by mouth daily.    [provider]  MELATONIN PO Take 1 tablet by mouth at bedtime as needed (sleep).    [provider]  midodrine (PROAMATINE) 2.5 MG tablet Take 5 mg by mouth 3 (three) times daily. Patient not taking: Reported on 07/23/2023 09/03/22   [provider]  nitroGLYCERIN (NITROSTAT) 0.4 MG SL tablet Place 0.4 mg under the tongue every 5 (five) minutes as needed for chest pain. Patient not taking: Reported on 07/23/2023    [provider]  olmesartan (BENICAR) 20 MG tablet Take 20 mg by mouth daily. Patient not taking: Reported on 07/23/2023 08/14/22   [provider]  sertraline (ZOLOFT) 50 MG tablet Take 50 mg by mouth at bedtime. Patient not taking: Reported on 07/23/2023    [provider]  SYNTHROID 175 MCG  tablet Take 1 tablet (175 mcg total) by mouth daily. NO MORE REFILLS WITHOUT OFFICE VISIT - 2ND NOTICE 11/14/14   Tonye Pearson, MD  thiamine 100 MG tablet Take 1 tablet (100 mg total) by mouth daily. 04/14/22   Leroy Sea, MD  traZODone (DESYREL) 50 MG tablet Take 1 tablet (50 mg total) by mouth at bedtime. Patient not taking:  Reported on 07/23/2023 09/15/22   Bing Neighbors, NP  Vitamin D, Ergocalciferol, (DRISDOL) 1.25 MG (50000 UNIT) CAPS capsule Take 50,000 Units by mouth every 7 (seven) days. Patient not taking: Reported on 07/23/2023 05/14/21   [provider]    Current Facility-Administered Medications  Medication Dose Route Frequency Provider Last Rate Last Admin   acetaminophen (TYLENOL) tablet 650 mg  650 mg Oral Q6H PRN Crosley, Debby, MD       Or   acetaminophen (TYLENOL) suppository 650 mg  650 mg Rectal Q6H PRN Crosley, Debby, MD       chlordiazePOXIDE (LIBRIUM) capsule 5 mg  5 mg Oral QID Elgergawy, Leana Roe, MD       ciprofloxacin (CILOXAN) 0.3 % ophthalmic solution 2 drop  2 drop Both Eyes Q2H while awake Elgergawy, Leana Roe, MD       folic acid (FOLVITE) tablet 1 mg  1 mg Oral Daily Crosley, Debby, MD   1 mg at 09/24/23 2238   levothyroxine (SYNTHROID) tablet 175 mcg  175 mcg Oral Q0600 Crosley, Debby, MD       LORazepam (ATIVAN) tablet 1-4 mg  1-4 mg Oral Q1H PRN Crosley, Debby, MD       Or   LORazepam (ATIVAN) injection 1-4 mg  1-4 mg Intravenous Q1H PRN Crosley, Debby, MD       magnesium sulfate IVPB 4 g 100 mL  4 g Intravenous Once Elgergawy, Leana Roe, MD       midodrine (PROAMATINE) tablet 5 mg  5 mg Oral TID Gery Pray, MD   5 mg at 09/24/23 2238   pantoprazole (PROTONIX) injection 40 mg  40 mg Intravenous Q12H Crosley, Debby, MD   40 mg at 09/25/23 0844   phosphorus (K PHOS NEUTRAL) tablet 500 mg  500 mg Oral TID Elgergawy, Leana Roe, MD       potassium chloride SA (KLOR-CON M) CR tablet 40 mEq  40 mEq Oral Q6H Elgergawy, Leana Roe, MD       potassium PHOSPHATE 30 mmol in dextrose 5 % 500 mL infusion  30 mmol Intravenous Once Elgergawy, Leana Roe, MD       senna-docusate (Senokot-S) tablet 1 tablet  1 tablet Oral QHS PRN Crosley, Debby, MD       sertraline (ZOLOFT) tablet 50 mg  50 mg Oral QHS Crosley, Debby, MD       thiamine (VITAMIN B1) tablet 100 mg  100 mg Oral Daily Crosley,  Debby, MD   100 mg at 09/24/23 2238   Or   thiamine (VITAMIN B1) injection 100 mg  100 mg Intravenous Daily Crosley, Debby, MD   100 mg at 09/25/23 0845    Allergies as of 09/24/2023 - Review Complete 09/24/2023  Allergen Reaction Noted   Sulfa antibiotics Anaphylaxis, Swelling, and Other (See Comments) 04/08/2012   Oxycodone Itching 07/13/2013   Nickel Rash and Other (See Comments) 07/23/2023    Social History   Socioeconomic History   Marital status: Single    Spouse name: Not on file   Number of children: Not on file   Years of  education: Not on file   Highest education level: Not on file  Occupational History   Not on file  Tobacco Use   Smoking status: Former    Types: Cigars   Smokeless tobacco: Never   Tobacco comments:    occasional quit 2012  Vaping Use   Vaping status: Never Used  Substance and Sexual Activity   Alcohol use: Yes    Comment: drinking liquor each night recently   Drug use: No   Sexual activity: Yes  Other Topics Concern   Not on file  Social History Narrative   Not on file   Social Determinants of Health   Financial Resource Strain: Not on file  Food Insecurity: No Food Insecurity (09/24/2023)   Hunger Vital Sign    Worried About Running Out of Food in the Last Year: Never true    Ran Out of Food in the Last Year: Never true  Transportation Needs: No Transportation Needs (09/24/2023)   PRAPARE - Administrator, Civil Service (Medical): No    Lack of Transportation (Non-Medical): No  Physical Activity: Not on file  Stress: Not on file  Social Connections: Not on file  Intimate Partner Violence: Not At Risk (09/24/2023)   Humiliation, Afraid, Rape, and Kick questionnaire    Fear of Current or Ex-Partner: No    Emotionally Abused: No    Physically Abused: No    Sexually Abused: No     Code Status   Code Status: Full Code  Review of Systems: All systems reviewed and negative except where noted in HPI.  Physical  Exam: Vital signs in last 24 hours: Temp:  [97.4 F (36.3 C)-98.9 F (37.2 C)] 98.2 F (36.8 C) (10/05 1156) Pulse Rate:  [88-102] 92 (10/05 0804) Resp:  [11-20] 11 (10/05 0804) BP: (75-119)/(47-88) 119/87 (10/05 0804) SpO2:  [96 %-100 %] 98 % (10/05 0804) Last BM Date : 09/24/23  General:  Pleasant male in NAD Psych:  Cooperative.  Eyes: Pupils equal Ears:  Normal auditory acuity Nose: No deformity, discharge or lesions Neck:  Supple, no masses felt Lungs:  Clear to auscultation.  Heart:  Regular rate, regular rhythm.  Abdomen:  Soft, nondistended, nontender, active bowel sounds, no masses felt Rectal :  Deferred Msk: Symmetrical without gross deformities.  Neurologic:  Alert, slow reaction times, slightly sluggish Extremities : No edema Skin:  Intact without significant lesions.    Intake/Output from previous day: 10/04 0701 - 10/05 0700 In: 790 [I.V.:325; Blood:315; IV Piggyback:150] Out: -  Intake/Output this shift:  Total I/O In: 860 [Blood:860] Out: -   Principal Problem:   Symptomatic anemia Active Problems:   Hypothyroidism   Essential hypertension   Hereditary hemochromatosis (HCC)   Hypokalemia   Acute on chronic anemia   Chronic idiopathic thrombocytopenia (HCC)    Willette Cluster, NP-C   09/25/2023, 12:02 PM  GI ATTENDING  History, laboratories, x-rays reviewed.  Agree with comprehensive consultation note as outlined above.  Agree with impressions and plans without additions or deletions.  Will follow.  Wilhemina Bonito. Eda Keys., M.D. [facility] Division of Gastroenterology

## 2023-09-25 NOTE — Plan of Care (Signed)

## 2023-09-25 NOTE — Progress Notes (Signed)
PROGRESS NOTE    Philip Humphrey  ZOX:[id number] DOB: [birth year] DOA: 09/24/2023 PCP: Courtney Paris, NP   Chief Complaint  Patient presents with   Fall    Brief Narrative:   This is a 62 year old male with past medical history of alcohol abuse, chronic hypotension, chronic thrombocytopenia, hereditary hematochromatosis, hypothyroidism, alcohol abuse.  He was brought in today after he became dizzy and passed out while in his yard.  He states he has been having increased episodes of dizziness and syncopal episode in the past 2 or 3 months.  He endorses some epigastric discomfort.  He additionally endorses chest pains which she states started on the right side and then traveled to the left that up his left jaw.  He describes the pain is a significant 8/10 but not long-lasting.  It can occur while he is at rest or with activity.  He states sometimes associated with syncope sometimes.  He describes the pain as burning/heavy.  Current pain level 0/10.  Patient has a history of alcohol abuse, drinking bourbon daily.  He states he has not drank in the last 2 days.  He denies having history of withdrawal symptoms.  Patient reports a history of gastric ulcer.   The patient was recently admitted 8/2 to 8/5 with posterior dizziness and near syncope.  At that point patient's hemoglobin was 6.6.  He received 1 unit of blood.  GI was consulted. EGD/colonoscopy recommended.  Patient refused and signed out AMA.    Today in the ER patient's hemoglobin 5.9, platelets 49, WBC 13.8, MCV 115.6, folate 4.3, iron 66 (normal).  Potassium 2.7, phosphorus 1.4, magnesium 1.2.  Troponin 115=>117.  EKG NSR without acute ischemic changes.  INR 1.3, fecal occult positive.  Alcohol level normal  Assessment & Plan:   Principal Problem:   Symptomatic anemia Active Problems:   Hypokalemia   Acute on chronic anemia   Hypothyroidism   Essential hypertension   Hereditary hemochromatosis (HCC)   Chronic idiopathic  thrombocytopenia (HCC)  Syncope and collapse -This appears to be multifactorial, the setting of soft blood pressure, multiple electrolyte abnormalities, and symptomatic anemia -Correct severe electrolyte derangement -Continue to monitor on telemetry -Continue with IV fluids -Fall precautions, will consult PT and OT  Symptomatic anemia -Patient with anemia with hemoglobin of 5.9, received 3 units overnight, hemoglobin improved this morning at 10.3 -Neuroplastic anemia, as well concern of chronic blood loss anemia in the setting of GI bleed with positive Hemoccult stool -Continue with IV Protonix 40 mg IV twice daily -Replace folic acid -GI consulted regarding further recommendations -Monitor CBC closely and transfuse as needed    Megaloblastic anemia Folic acid deficiency.   -Continue with folic acid supplements  Hypokalemia Hypomagnesemia Hypophosphatemia Hypocalcemia -Being replaced, continue to monitor closely   Transaminitis Coagulopathy -due to alcohol abuse,  - VIT K ordered by GI  Elevated troponin -Likely secondary to patient' severe and prolonged anemia -2D echo in a.m.   Alcohol abuse -CIWA protocol ordered -High risk for withdrawals, will start on scheduled Librium -Continue thiamine and folic acid   Chronic thrombocytopenia (HCC) -Likely related to alcohol abuse and hepatic dysfunction.  No recorded liver cirrhosis.  Will order a right upper quadrant ultrasound   Chronic hypotension -Patient maintained on midodrine.  -  Patient currently hypotensive but asymptomatic.  Transfusing and reordering midodrine,   Hereditary hemochromatosis (HCC) -Per oncology   Hypothyroidism -Synthroid resumed     DVT prophylaxis: SCD Code Status: Full code Family Communication: None at bedside  Disposition:   Status is: Inpatient    Consultants:  GI Subjective:  Patient reports BM earlier today, cannot recall the exact color, he was unhappy that he was n.p.o.  earlier for ultrasound, and he is on clear liquid diet currently,  Objective: Vitals:   09/25/23 0528 09/25/23 0755 09/25/23 0804 09/25/23 1156  BP: 113/85 118/88 119/87 98/72  Pulse: 91 88 92 (!) 101  Resp: 17 18 11 19   Temp: 98.4 F (36.9 C) 98.4 F (36.9 C) 98.7 F (37.1 C) 98.2 F (36.8 C)  TempSrc: Oral Oral Oral Oral  SpO2: 99% 99% 98% 98%    Intake/Output Summary (Last 24 hours) at 09/25/2023 1250 Last data filed at 09/25/2023 0815 Gross per 24 hour  Intake 1650 ml  Output --  Net 1650 ml   There were no vitals filed for this visit.  Examination:  Awake Alert, frail, deconditioned Symmetrical Chest wall movement, Good air movement bilaterally, CTAB RRR,No Gallops,Rubs or new Murmurs, No Parasternal Heave +ve B.Sounds, Abd Soft, No tenderness, No rebound - guarding or rigidity. No Cyanosis, Clubbing or edema, No new Rash or bruise      Data Reviewed: I have personally reviewed following labs and imaging studies  CBC: Recent Labs  Lab 09/24/23 1930 09/24/23 1945 09/25/23 0946  WBC  --  13.8* 16.2*  NEUTROABS  --  11.0*  --   HGB 6.5* 5.9* 10.3*  HCT 19.0* 18.5* 30.5*  MCV  --  115.6* 96.5  PLT  --  49* 45*    Basic Metabolic Panel: Recent Labs  Lab 09/24/23 1930 09/24/23 1945 09/24/23 2110 09/25/23 0946  NA 138 139  --  136  K 2.7* 2.7*  --  3.1*  CL 96* 96*  --  100  CO2  --  27  --  27  GLUCOSE 76 75  --  96  BUN 10 11  --  8  CREATININE 0.80 0.95  --  0.73  CALCIUM  --  8.2*  --  7.8*  MG  --  1.2*  --  1.6*  PHOS  --   --  1.4* 1.4*    GFR: CrCl cannot be calculated (Unknown ideal weight.).  Liver Function Tests: Recent Labs  Lab 09/24/23 1945  AST 49*  ALT 23  ALKPHOS 195*  BILITOT 2.1*  PROT 5.6*  ALBUMIN 3.0*    CBG: No results for input(s): "GLUCAP" in the last 168 hours.   No results found for this or any previous visit (from the past 240 hour(s)).       Radiology Studies: US Abdomen Limited RUQ  (LIVER/GB)  Result Date: 09/25/2023 CLINICAL DATA:  Thrombocytopenia EXAM: ULTRASOUND ABDOMEN LIMITED RIGHT UPPER QUADRANT COMPARISON:  CT 07/23/2023 FINDINGS: Gallbladder: No gallstones or wall thickening visualized. No sonographic Murphy sign noted by sonographer. Common bile duct: Diameter: 6 mm.  No intrahepatic biliary ductal dilatation. Liver: No focal lesion identified. Coarse echogenic parenchyma. Portal vein is patent on color Doppler imaging with normal direction of blood flow towards the liver. Other: Technologist describes technically difficult study secondary to body habitus. IMPRESSION: 1. No acute findings. 2. Coarse echogenic liver parenchyma suggesting steatosis. Electronically Signed   By: Corlis Leak M.D.   On: 09/25/2023 10:37   DG Knee 1-2 Views Right  Result Date: 09/25/2023 CLINICAL DATA:  Larey Seat EXAM: RIGHT KNEE - 1-2 VIEW COMPARISON:  None Available. FINDINGS: No fracture, dislocation, or effusion. Normal alignment and mineralization. Mild chondrocalcinosis in medial and lateral compartments suggesting  CPPD. No significant osseous degenerative change. IMPRESSION: 1. No fracture or other acute findings. 2. Chondrocalcinosis suggesting CPPD. Electronically Signed   By: Corlis Leak M.D.   On: 09/25/2023 10:36   DG Ankle 2 Views Right  Result Date: 09/25/2023 CLINICAL DATA:  Larey Seat EXAM: RIGHT ANKLE - 2 VIEW COMPARISON:  None Available. FINDINGS: There is no evidence of fracture, dislocation, or joint effusion. Small calcaneal spur. There is no evidence of arthropathy or other focal bone abnormality. Soft tissues are unremarkable. IMPRESSION: Negative. Electronically Signed   By: Corlis Leak M.D.   On: 09/25/2023 10:35   DG Ankle 2 Views Left  Result Date: 09/25/2023 CLINICAL DATA:  Larey Seat EXAM: LEFT ANKLE - 2 VIEW COMPARISON:  None Available. FINDINGS: There is no evidence of fracture, dislocation, or joint effusion. Calcaneal spur. There is no evidence of arthropathy or other focal bone  abnormality. Soft tissues are unremarkable. IMPRESSION: Negative. Electronically Signed   By: Corlis Leak M.D.   On: 09/25/2023 10:35   DG Knee 1-2 Views Left  Result Date: 09/25/2023 CLINICAL DATA:  Larey Seat EXAM: LEFT KNEE - 1-2 VIEW COMPARISON:  None Available. FINDINGS: No evidence of fracture, dislocation, or joint effusion. Small exostosis from the proximal fibular shaft, without aggressive periosteal reaction. No evidence of arthropathy or other focal bone abnormality. Soft tissues are unremarkable. IMPRESSION: 1. No acute findings. 2. Small proximal fibular exostosis.  Recommend   MRI if painful. Electronically Signed   By: Corlis Leak M.D.   On: 09/25/2023 10:34   CT Head Wo Contrast  Result Date: 09/24/2023 CLINICAL DATA:  Fall, dizziness, headache. EXAM: CT HEAD WITHOUT CONTRAST TECHNIQUE: Contiguous axial images were obtained from the base of the skull through the vertex without intravenous contrast. RADIATION DOSE REDUCTION: This exam was performed according to the departmental dose-optimization program which includes automated exposure control, adjustment of the mA and/or kV according to patient size and/or use of iterative reconstruction technique. COMPARISON:  07/23/2023 FINDINGS: Brain: No intracranial hemorrhage, mass effect, or midline shift. Brain volume is normal for age. No hydrocephalus. The basilar cisterns are patent. Minor periventricular chronic small vessel ischemia. No evidence of territorial infarct or acute ischemia. No extra-axial or intracranial fluid collection. Vascular: No hyperdense vessel or unexpected calcification. Skull: No fracture or focal lesion. Sinuses/Orbits: No acute findings. Partial opacification of left mastoid air cells, unchanged. Other: None. IMPRESSION: 1. No acute intracranial abnormality. No skull fracture. 2. Minor chronic small vessel ischemia. Electronically Signed   By: Narda Rutherford M.D.   On: 09/24/2023 21:36   DG Chest Portable 1 View  Result  Date: 09/24/2023 CLINICAL DATA:  Chest pain and weakness. EXAM: PORTABLE CHEST 1 VIEW COMPARISON:  07/23/2023 FINDINGS: Low lung volumes persist. Normal heart size with unchanged mediastinal contours. No focal airspace disease. No pleural effusion or pneumothorax. No pulmonary edema. IMPRESSION: Low lung volumes without acute chest finding. Electronically Signed   By: Narda Rutherford M.D.   On: 09/24/2023 21:32        Scheduled Meds:  chlordiazePOXIDE  5 mg Oral QID   ciprofloxacin  2 drop Both Eyes Q2H while awake   folic acid  1 mg Oral Daily   levothyroxine  175 mcg Oral Q0600   midodrine  5 mg Oral TID   pantoprazole (PROTONIX) IV  40 mg Intravenous Q12H   phosphorus  500 mg Oral TID   phytonadione  10 mg Oral Once   potassium chloride  40 mEq Oral Q6H   sertraline  50 mg Oral QHS   thiamine  100 mg Oral Daily   Or   thiamine  100 mg Intravenous Daily   Continuous Infusions:  magnesium sulfate bolus IVPB 4 g (09/25/23 1213)   potassium PHOSPHATE IVPB (in mmol) 30 mmol (09/25/23 1225)     LOS: 1 day        Huey Bienenstock, MD [facility]   To contact the attending provider between 7A-7P or the covering provider during after hours 7P-7A, please log into the web site [email or web address] and access using universal [place] password for that web site. If you do not have the password, please call the hospital operator.  09/25/2023, 12:50 PM

## 2023-09-25 NOTE — Plan of Care (Signed)

## 2023-09-26 DIAGNOSIS — E876 Hypokalemia: Secondary | ICD-10-CM | POA: Diagnosis not present

## 2023-09-26 DIAGNOSIS — D539 Nutritional anemia, unspecified: Secondary | ICD-10-CM

## 2023-09-26 DIAGNOSIS — D649 Anemia, unspecified: Secondary | ICD-10-CM | POA: Diagnosis not present

## 2023-09-26 DIAGNOSIS — R195 Other fecal abnormalities: Secondary | ICD-10-CM

## 2023-09-26 DIAGNOSIS — F10939 Alcohol use, unspecified with withdrawal, unspecified: Secondary | ICD-10-CM | POA: Diagnosis not present

## 2023-09-26 LAB — TYPE AND SCREEN
ABO/RH(D): A POS
Antibody Screen: NEGATIVE
Unit division: 0
Unit division: 0
Unit division: 0

## 2023-09-26 LAB — BASIC METABOLIC PANEL
Anion gap: 9 (ref 5–15)
BUN: 5 mg/dL — ABNORMAL LOW (ref 8–23)
CO2: 25 mmol/L (ref 22–32)
Calcium: 7.5 mg/dL — ABNORMAL LOW (ref 8.9–10.3)
Chloride: 101 mmol/L (ref 98–111)
Creatinine, Ser: 0.72 mg/dL (ref 0.61–1.24)
GFR, Estimated: >60 mL/min (ref >60)
Glucose, Bld: 93 mg/dL (ref 70–99)
Potassium: 3.8 mmol/L (ref 3.5–5.1)
Sodium: 135 mmol/L (ref 135–145)

## 2023-09-26 LAB — CBC
HCT: 29 % — ABNORMAL LOW (ref 39.0–52.0)
Hemoglobin: 9.6 g/dL — ABNORMAL LOW (ref 13.0–17.0)
MCH: 32.2 pg (ref 26.0–34.0)
MCHC: 33.1 g/dL (ref 30.0–36.0)
MCV: 97.3 fL (ref 80.0–100.0)
Platelets: 54 10*3/uL — ABNORMAL LOW (ref 150–400)
RBC: 2.98 MIL/uL — ABNORMAL LOW (ref 4.22–5.81)
RDW: 23.8 % — ABNORMAL HIGH (ref 11.5–15.5)
WBC: 14.8 10*3/uL — ABNORMAL HIGH (ref 4.0–10.5)
nRBC: 0.3 % — ABNORMAL HIGH (ref 0.0–0.2)

## 2023-09-26 LAB — BPAM RBC
Blood Product Expiration Date: 202411012359
Blood Product Expiration Date: 202411012359
Blood Product Unit Number: 202410112359
ISSUE DATE / TIME: 202410042135
ISSUE DATE / TIME: 202410050037
PRODUCT CODE: 202410050453
PRODUCT CODE: 202411012359
Unit Type and Rh: 6200
Unit Type and Rh: 202410112359
Unit Type and Rh: 6200
Unit Type and Rh: 6200
Unit Type and Rh: 6200

## 2023-09-26 LAB — HEPATIC FUNCTION PANEL
ALT: 18 U/L (ref 0–44)
AST: 42 U/L — ABNORMAL HIGH (ref 15–41)
Albumin: 2.6 g/dL — ABNORMAL LOW (ref 3.5–5.0)
Alkaline Phosphatase: 180 U/L — ABNORMAL HIGH (ref 38–126)
Bilirubin, Direct: 1 mg/dL — ABNORMAL HIGH (ref 0.0–0.2)
Indirect Bilirubin: 1.5 mg/dL — ABNORMAL HIGH (ref 0.3–0.9)
Total Bilirubin: 2.5 mg/dL — ABNORMAL HIGH (ref 0.3–1.2)
Total Protein: 4.9 g/dL — ABNORMAL LOW (ref 6.5–8.1)

## 2023-09-26 LAB — PHOSPHORUS: Phosphorus: 2.1 mg/dL — ABNORMAL LOW (ref 2.5–4.6)

## 2023-09-26 LAB — PROTIME-INR
INR: 1.2 (ref 0.8–1.2)
Prothrombin Time: 15.3 s — ABNORMAL HIGH (ref 11.4–15.2)

## 2023-09-26 LAB — MAGNESIUM: Magnesium: 2 mg/dL (ref 1.7–2.4)

## 2023-09-26 LAB — OCCULT BLOOD X 1 CARD TO LAB, STOOL: Fecal Occult Bld: POSITIVE — AB

## 2023-09-26 MED ORDER — PANTOPRAZOLE SODIUM 40 MG PO TBEC
40.0000 mg | DELAYED_RELEASE_TABLET | Freq: Two times a day (BID) | ORAL | Status: DC
  Administered 2023-09-26 – 2023-10-04 (×16): 40 mg via ORAL
  Filled 2023-09-26 (×16): qty 1

## 2023-09-26 MED ORDER — SODIUM PHOSPHATES 45 MMOLE/15ML IV SOLN
30.0000 mmol | Freq: Once | INTRAVENOUS | Status: AC
  Administered 2023-09-26: 30 mmol via INTRAVENOUS
  Filled 2023-09-26: qty 10

## 2023-09-26 NOTE — Progress Notes (Signed)
PROGRESS NOTE    Philip Humphrey  ZOX:[id number] DOB: [birth year] DOA: 09/24/2023 PCP: Courtney Paris, NP   Chief Complaint  Patient presents with   Fall    Brief Narrative:   This is a 62 year old male with past medical history of alcohol abuse, chronic hypotension, chronic thrombocytopenia, hereditary hematochromatosis, hypothyroidism, alcohol abuse.  He was brought in today after he became dizzy and passed out while in his yard.  He states he has been having increased episodes of dizziness and syncopal episode in the past 2 or 3 months.  He endorses some epigastric discomfort.  He additionally endorses chest pains which she states started on the right side and then traveled to the left that up his left jaw.  He describes the pain is a significant 8/10 but not long-lasting.  It can occur while he is at rest or with activity.  He states sometimes associated with syncope sometimes.  He describes the pain as burning/heavy.  Current pain level 0/10.  Patient has a history of alcohol abuse, drinking bourbon daily.  He states he has not drank in the last 2 days.  He denies having history of withdrawal symptoms.  Patient reports a history of gastric ulcer.   The patient was recently admitted 8/2 to 8/5 with posterior dizziness and near syncope.  At that point patient's hemoglobin was 6.6.  He received 1 unit of blood.  GI was consulted. EGD/colonoscopy recommended.  Patient refused and signed out AMA.    Today in the ER patient's hemoglobin 5.9, platelets 49, WBC 13.8, MCV 115.6, folate 4.3, iron 66 (normal).  Potassium 2.7, phosphorus 1.4, magnesium 1.2.  Troponin 115=>117.  EKG NSR without acute ischemic changes.  INR 1.3, fecal occult positive.  Alcohol level normal  Assessment & Plan:   Principal Problem:   Symptomatic anemia Active Problems:   Hypokalemia   Acute on chronic anemia   Hypothyroidism   Essential hypertension   Hereditary hemochromatosis (HCC)   Chronic idiopathic  thrombocytopenia (HCC)  Syncope and collapse -This appears to be multifactorial, the setting of soft blood pressure, multiple electrolyte abnormalities, and symptomatic anemia -Correct severe electrolyte derangement -Continue to monitor on telemetry -Continue with IV fluids -Fall precautions,  consulted PT and OT  Symptomatic anemia -Patient with anemia with hemoglobin of 5.9, received 3 units on admission. -Neuroplastic anemia, as well concern of chronic blood loss anemia in the setting of GI bleed with positive Hemoccult stool -Continue with IV Protonix 40 mg IV twice daily -Replace folic acid -GI consult greatly appreciated -Continue to monitor CBC and transfuse as needed, hemoglobin remained stable at 9.6 this morning    Megaloblastic anemia Folic acid deficiency.   -Continue with folic acid supplements  Hypokalemia Hypomagnesemia Hypophosphatemia Hypocalcemia -Being replaced, continue to monitor closely   Transaminitis Coagulopathy -due to alcohol abuse,  - VIT K ordered by GI  Elevated troponin -Likely secondary to patient' severe and prolonged anemia -2D echo in a.m.   Alcohol abuse Alcohol withdrawals -CIWA protocol ordered -High risk for withdrawals, will start on scheduled Librium, he is in active withdrawal with DT currently, continue with CIWA protocol -Continue thiamine and folic acid   Chronic thrombocytopenia (HCC) -Likely related to alcohol abuse and hepatic dysfunction.  No recorded liver cirrhosis.  Will order a right upper quadrant ultrasound   Chronic hypotension -Patient maintained on midodrine.  -  Patient currently hypotensive but asymptomatic.  Transfusing and reordering midodrine,   Hereditary hemochromatosis (HCC) -Per oncology   Hypothyroidism -Synthroid resumed  Left eye conjunctivitis -Continue with Cipro eyedrops     DVT prophylaxis: SCD Code Status: Full code Family Communication: None at bedside, tried to reach daughter by  phone, unable to leave voicemail as voicemail box is full Disposition:   Status is: Inpatient    Consultants:  GI Subjective:  Patient with withdrawals overnight, did require some as needed Ativan despite being on Librium.  Objective: Vitals:   09/25/23 2200 09/26/23 0000 09/26/23 0400 09/26/23 0805  BP:  115/80 106/78 113/88  Pulse: 90 87 84 88  Resp:  18 16 18   Temp:  98.3 F (36.8 C) 98.1 F (36.7 C) 97.9 F (36.6 C)  TempSrc:  Oral Oral Oral  SpO2:  95% 93% 95%    Intake/Output Summary (Last 24 hours) at 09/26/2023 1200 Last data filed at 09/25/2023 1800 Gross per 24 hour  Intake 575 ml  Output --  Net 575 ml   There were no vitals filed for this visit.  Examination:  Awake Alert, he is more confused and restless today Symmetrical Chest wall movement, Good air movement bilaterally, CTAB RRR,No Gallops,Rubs or new Murmurs, No Parasternal Heave +ve B.Sounds, Abd Soft, No tenderness, No rebound - guarding or rigidity. No Cyanosis, Clubbing or edema, No new Rash or bruise     Data Reviewed: I have personally reviewed following labs and imaging studies  CBC: Recent Labs  Lab 09/24/23 1930 09/24/23 1945 09/25/23 0946 09/26/23 0444  WBC  --  13.8* 16.2* 14.8*  NEUTROABS  --  11.0*  --   --   HGB 6.5* 5.9* 10.3* 9.6*  HCT 19.0* 18.5* 30.5* 29.0*  MCV  --  115.6* 96.5 97.3  PLT  --  49* 45* 54*    Basic Metabolic Panel: Recent Labs  Lab 09/24/23 1930 09/24/23 1945 09/24/23 2110 09/25/23 0946 09/26/23 0444  NA 138 139  --  136 135  K 2.7* 2.7*  --  3.1* 3.8  CL 96* 96*  --  100 101  CO2  --  27  --  27 25  GLUCOSE 76 75  --  96 93  BUN 10 11  --  8 5*  CREATININE 0.80 0.95  --  0.73 0.72  CALCIUM  --  8.2*  --  7.8* 7.5*  MG  --  1.2*  --  1.6* 2.0  PHOS  --   --  1.4* 1.4* 2.1*    GFR: CrCl cannot be calculated (Unknown ideal weight.).  Liver Function Tests: Recent Labs  Lab 09/24/23 1945 09/26/23 0444  AST 49* 42*  ALT 23 18   ALKPHOS 195* 180*  BILITOT 2.1* 2.5*  PROT 5.6* 4.9*  ALBUMIN 3.0* 2.6*    CBG: No results for input(s): "GLUCAP" in the last 168 hours.   Recent Results (from the past 240 hour(s))  MRSA Next Gen by PCR, Nasal     Status: None   Collection Time: 09/25/23  2:52 AM   Specimen: Nasal Mucosa; Nasal Swab  Result Value Ref Range Status   MRSA by PCR Next Gen NOT DETECTED NOT DETECTED Final    Comment: (NOTE) The GeneXpert MRSA Assay (FDA approved for NASAL specimens only), is one component of a comprehensive MRSA colonization surveillance program. It is not intended to diagnose MRSA infection nor to guide or monitor treatment for MRSA infections. Test performance is not FDA approved in patients less than 64 years old. Performed at [facility], [address], [place], [place] [address]  Radiology Studies: US Abdomen Limited RUQ (LIVER/GB)  Result Date: 09/25/2023 CLINICAL DATA:  Thrombocytopenia EXAM: ULTRASOUND ABDOMEN LIMITED RIGHT UPPER QUADRANT COMPARISON:  CT 07/23/2023 FINDINGS: Gallbladder: No gallstones or wall thickening visualized. No sonographic Murphy sign noted by sonographer. Common bile duct: Diameter: 6 mm.  No intrahepatic biliary ductal dilatation. Liver: No focal lesion identified. Coarse echogenic parenchyma. Portal vein is patent on color Doppler imaging with normal direction of blood flow towards the liver. Other: Technologist describes technically difficult study secondary to body habitus. IMPRESSION: 1. No acute findings. 2. Coarse echogenic liver parenchyma suggesting steatosis. Electronically Signed   By: Corlis Leak M.D.   On: 09/25/2023 10:37   DG Knee 1-2 Views Right  Result Date: 09/25/2023 CLINICAL DATA:  Larey Seat EXAM: RIGHT KNEE - 1-2 VIEW COMPARISON:  None Available. FINDINGS: No fracture, dislocation, or effusion. Normal alignment and mineralization. Mild chondrocalcinosis in medial and lateral compartments suggesting CPPD. No  significant osseous degenerative change. IMPRESSION: 1. No fracture or other acute findings. 2. Chondrocalcinosis suggesting CPPD. Electronically Signed   By: Corlis Leak M.D.   On: 09/25/2023 10:36   DG Ankle 2 Views Right  Result Date: 09/25/2023 CLINICAL DATA:  Larey Seat EXAM: RIGHT ANKLE - 2 VIEW COMPARISON:  None Available. FINDINGS: There is no evidence of fracture, dislocation, or joint effusion. Small calcaneal spur. There is no evidence of arthropathy or other focal bone abnormality. Soft tissues are unremarkable. IMPRESSION: Negative. Electronically Signed   By: Corlis Leak M.D.   On: 09/25/2023 10:35   DG Ankle 2 Views Left  Result Date: 09/25/2023 CLINICAL DATA:  Larey Seat EXAM: LEFT ANKLE - 2 VIEW COMPARISON:  None Available. FINDINGS: There is no evidence of fracture, dislocation, or joint effusion. Calcaneal spur. There is no evidence of arthropathy or other focal bone abnormality. Soft tissues are unremarkable. IMPRESSION: Negative. Electronically Signed   By: Corlis Leak M.D.   On: 09/25/2023 10:35   DG Knee 1-2 Views Left  Result Date: 09/25/2023 CLINICAL DATA:  Larey Seat EXAM: LEFT KNEE - 1-2 VIEW COMPARISON:  None Available. FINDINGS: No evidence of fracture, dislocation, or joint effusion. Small exostosis from the proximal fibular shaft, without aggressive periosteal reaction. No evidence of arthropathy or other focal bone abnormality. Soft tissues are unremarkable. IMPRESSION: 1. No acute findings. 2. Small proximal fibular exostosis.  Recommend   MRI if painful. Electronically Signed   By: Corlis Leak M.D.   On: 09/25/2023 10:34   CT Head Wo Contrast  Result Date: 09/24/2023 CLINICAL DATA:  Fall, dizziness, headache. EXAM: CT HEAD WITHOUT CONTRAST TECHNIQUE: Contiguous axial images were obtained from the base of the skull through the vertex without intravenous contrast. RADIATION DOSE REDUCTION: This exam was performed according to the departmental dose-optimization program which includes  automated exposure control, adjustment of the mA and/or kV according to patient size and/or use of iterative reconstruction technique. COMPARISON:  07/23/2023 FINDINGS: Brain: No intracranial hemorrhage, mass effect, or midline shift. Brain volume is normal for age. No hydrocephalus. The basilar cisterns are patent. Minor periventricular chronic small vessel ischemia. No evidence of territorial infarct or acute ischemia. No extra-axial or intracranial fluid collection. Vascular: No hyperdense vessel or unexpected calcification. Skull: No fracture or focal lesion. Sinuses/Orbits: No acute findings. Partial opacification of left mastoid air cells, unchanged. Other: None. IMPRESSION: 1. No acute intracranial abnormality. No skull fracture. 2. Minor chronic small vessel ischemia. Electronically Signed   By: Narda Rutherford M.D.   On: 09/24/2023 21:36   DG Chest Portable 1  View  Result Date: 09/24/2023 CLINICAL DATA:  Chest pain and weakness. EXAM: PORTABLE CHEST 1 VIEW COMPARISON:  07/23/2023 FINDINGS: Low lung volumes persist. Normal heart size with unchanged mediastinal contours. No focal airspace disease. No pleural effusion or pneumothorax. No pulmonary edema. IMPRESSION: Low lung volumes without acute chest finding. Electronically Signed   By: Narda Rutherford M.D.   On: 09/24/2023 21:32        Scheduled Meds:  chlordiazePOXIDE  5 mg Oral QID   ciprofloxacin  2 drop Both Eyes Q2H while awake   folic acid  1 mg Oral Daily   levothyroxine  175 mcg Oral Q0600   midodrine  5 mg Oral TID   pantoprazole (PROTONIX) IV  40 mg Intravenous Q12H   phosphorus  500 mg Oral TID   sertraline  50 mg Oral QHS   thiamine  100 mg Oral Daily   Or   thiamine  100 mg Intravenous Daily   Continuous Infusions:  sodium phosphate 30 mmol in dextrose 5 % 250 mL infusion 30 mmol (09/26/23 1053)     LOS: 2 days        Huey Bienenstock, MD [facility]   To contact the attending provider between  7A-7P or the covering provider during after hours 7P-7A, please log into the web site [email or web address] and access using universal [place] password for that web site. If you do not have the password, please call the hospital operator.  09/26/2023, 12:00 PM

## 2023-09-26 NOTE — Plan of Care (Signed)
  Problem: Education: Goal: Knowledge of General Education information will improve Description: Including pain rating scale, medication(s)/side effects and non-pharmacologic comfort measures Outcome: Progressing   Problem: Health Behavior/Discharge Planning: Goal: Ability to manage health-related needs will improve Outcome: Progressing   Problem: Clinical Measurements: Goal: Ability to maintain clinical measurements within normal limits will improve Outcome: Progressing Goal: Will remain free from infection Outcome: Progressing   Problem: Activity: Goal: Risk for activity intolerance will decrease Outcome: Progressing   Problem: Nutrition: Goal: Adequate nutrition will be maintained Outcome: Progressing   Problem: Coping: Goal: Level of anxiety will decrease Outcome: Progressing   Problem: Elimination: Goal: Will not experience complications related to bowel motility Outcome: Progressing Goal: Will not experience complications related to urinary retention Outcome: Progressing   Problem: Safety: Goal: Ability to remain free from injury will improve Outcome: Progressing   Problem: Skin Integrity: Goal: Risk for impaired skin integrity will decrease Outcome: Progressing

## 2023-09-26 NOTE — Progress Notes (Addendum)
Daily Progress Note  DOA: 09/24/2023 Hospital Day: 3  Chief Complaint: Heme + anemia   ASSESSMENT    Brief Narrative:  Philip Humphrey is a 62 y.o. year old male with a history of  Etoh abuse., GERD, hepatic steatosis, hereditary hemochromatosis, history of B12 and folate deficiency, diverticulosis, colon polyps, excision of large anal condyloma, HTN, thyroidectomy  Acute on chronic macrocytic anemia / FOBT+. Hgb 5.9 on admission, down from 8.4 in Aug.  Anemia likely multifactorial ( folate deficiency, possibly bone marrow suppression from etoh abuse and maybe chronic GI blood loss). B12 is normal, folate is low.  Ferritin normal ,TIBC low with elevated iron saturation but Iron studies may not be reliable in setting of etoh abuse and also hereditary hemochromatosis.    *Today Hgb stable at 9.6 post 3 u PRBCs  Hereditary hemochromatosis   Remote history of colon polyps ( Dr. Kinnie Scales). No polyps on last colonoscopy in 2016 by Dr. Ovidio Kin   Etoh abuse / elevated liver chemistries. No evidence for cirrhosis on RUQ [place] or recent CT scan.   MDF doesn't meet criteria for steroids. Unclear when last drink was. On CIWA protocol. *LFTs about the same as yesterday.    Mild coagulopathy, possibly Vit K deficiency INR normalized after dose of Vit K    Electrolyte abnormalities with low phosphorus hypokalemia, low Mg+,  hypocalcemia Repletion progress   Elevated troponin, chest pain  Felt to be 2/2 to severe anemia Initial EKG- NSR without acute ischemic changes.   *No chest pain today  History of GERD, takes Pepcid at home     Principal Problem:   Symptomatic anemia Active Problems:   Hypothyroidism   Essential hypertension   Hereditary hemochromatosis (HCC)   Hypokalemia   Acute on chronic anemia   Chronic idiopathic thrombocytopenia (HCC)    PLAN   --Folate repletion in progress --Permanent abstinence from Etoh --At risk for Etoh withdrawal. Getting Ativan and  sleepy today. He needs EGD / colonoscopy when out of Etoh withdrawal window.    --Continue BID PPI. Will change from IV to PO  Subjective   Sleepy. No complaints   Objective     Recent Labs    09/24/23 1945 09/25/23 0946 09/26/23 0444  WBC 13.8* 16.2* 14.8*  HGB 5.9* 10.3* 9.6*  HCT 18.5* 30.5* 29.0*  PLT 49* 45* 54*   BMET Recent Labs    09/24/23 1945 09/25/23 0946 09/26/23 0444  NA 139 136 135  K 2.7* 3.1* 3.8  CL 96* 100 101  CO2 27 27 25   GLUCOSE 75 96 93  BUN 11 8 5*  CREATININE 0.95 0.73 0.72  CALCIUM 8.2* 7.8* 7.5*   LFT Recent Labs    09/26/23 0444  PROT 4.9*  ALBUMIN 2.6*  AST 42*  ALT 18  ALKPHOS 180*  BILITOT 2.5*  BILIDIR 1.0*  IBILI 1.5*   PT/INR Recent Labs    09/24/23 1943 09/26/23 0444  LABPROT 16.2* 15.3*  INR 1.3* 1.2     Imaging:  US Abdomen Limited RUQ (LIVER/GB) CLINICAL DATA:  Thrombocytopenia  EXAM: ULTRASOUND ABDOMEN LIMITED RIGHT UPPER QUADRANT  COMPARISON:  CT 07/23/2023  FINDINGS: Gallbladder:  No gallstones or wall thickening visualized. No sonographic Murphy sign noted by sonographer.  Common bile duct:  Diameter: 6 mm.  No intrahepatic biliary ductal dilatation.  Liver:  No focal lesion identified. Coarse echogenic parenchyma. Portal vein is patent on color Doppler imaging with normal direction of blood flow towards  the liver.  Other: Technologist describes technically difficult study secondary to body habitus.  IMPRESSION: 1. No acute findings. 2. Coarse echogenic liver parenchyma suggesting steatosis.  Electronically Signed   By: Corlis Leak M.D.   On: 09/25/2023 10:37 DG Knee 1-2 Views Right CLINICAL DATA:  Larey Seat  EXAM: RIGHT KNEE - 1-2 VIEW  COMPARISON:  None Available.  FINDINGS: No fracture, dislocation, or effusion. Normal alignment and mineralization. Mild chondrocalcinosis in medial and lateral compartments suggesting CPPD. No significant osseous  degenerative change.  IMPRESSION: 1. No fracture or other acute findings. 2. Chondrocalcinosis suggesting CPPD.  Electronically Signed   By: Corlis Leak M.D.   On: 09/25/2023 10:36 DG Ankle 2 Views Right CLINICAL DATA:  Larey Seat  EXAM: RIGHT ANKLE - 2 VIEW  COMPARISON:  None Available.  FINDINGS: There is no evidence of fracture, dislocation, or joint effusion. Small calcaneal spur. There is no evidence of arthropathy or other focal bone abnormality. Soft tissues are unremarkable.  IMPRESSION: Negative.  Electronically Signed   By: Corlis Leak M.D.   On: 09/25/2023 10:35 DG Ankle 2 Views Left CLINICAL DATA:  Larey Seat  EXAM: LEFT ANKLE - 2 VIEW  COMPARISON:  None Available.  FINDINGS: There is no evidence of fracture, dislocation, or joint effusion. Calcaneal spur. There is no evidence of arthropathy or other focal bone abnormality. Soft tissues are unremarkable.  IMPRESSION: Negative.  Electronically Signed   By: Corlis Leak M.D.   On: 09/25/2023 10:35 DG Knee 1-2 Views Left CLINICAL DATA:  Larey Seat  EXAM: LEFT KNEE - 1-2 VIEW  COMPARISON:  None Available.  FINDINGS: No evidence of fracture, dislocation, or joint effusion. Small exostosis from the proximal fibular shaft, without aggressive periosteal reaction. No evidence of arthropathy or other focal bone abnormality. Soft tissues are unremarkable.  IMPRESSION: 1. No acute findings. 2. Small proximal fibular exostosis.  Recommend   MRI if painful.  Electronically Signed   By: Corlis Leak M.D.   On: 09/25/2023 10:34     Scheduled inpatient medications:   chlordiazePOXIDE  5 mg Oral QID   ciprofloxacin  2 drop Both Eyes Q2H while awake   folic acid  1 mg Oral Daily   levothyroxine  175 mcg Oral Q0600   midodrine  5 mg Oral TID   pantoprazole (PROTONIX) IV  40 mg Intravenous Q12H   phosphorus  500 mg Oral TID   sertraline  50 mg Oral QHS   thiamine  100 mg Oral Daily   Or   thiamine  100 mg Intravenous  Daily   Continuous inpatient infusions:   sodium phosphate 30 mmol in dextrose 5 % 250 mL infusion 30 mmol (09/26/23 1053)   PRN inpatient medications: acetaminophen **OR** acetaminophen, LORazepam **OR** LORazepam, senna-docusate  Vital signs in last 24 hours: Temp:  [97.9 F (36.6 C)-98.3 F (36.8 C)] 97.9 F (36.6 C) (10/06 0805) Pulse Rate:  [84-91] 88 (10/06 0805) Resp:  [16-18] 18 (10/06 0805) BP: (97-115)/(61-88) 113/88 (10/06 0805) SpO2:  [93 %-97 %] 95 % (10/06 0805) Last BM Date : 09/24/23  Intake/Output Summary (Last 24 hours) at 09/26/2023 1213 Last data filed at 09/25/2023 1800 Gross per 24 hour  Intake 575 ml  Output --  Net 575 ml    Intake/Output from previous day: 10/05 0701 - 10/06 0700 In: 1750 [P.O.:575; Blood:1175] Out: -  Intake/Output this shift: No intake/output data recorded.   Physical Exam:  General: Alert male in NAD Heart:  Regular rate and rhythm.  Pulmonary:  Normal respiratory effort Abdomen: Soft, nondistended, nontender. Normal bowel sounds. Extremities: No lower extremity edema  Neurologic: Alert and oriented Psych: Pleasant. Cooperative. Insight appears normal.      LOS: 2 days   Willette Cluster ,NP 09/26/2023, 12:13 PM  GI ATTENDING  Interval history data reviewed.  Agree with interval progress note.  Stable posttransfusion.  Recommend abnormalities being addressed.  On CIWA protocol.  Will follow.  Ideally should have colonoscopy and upper endoscopy before discharge.  He declined during his most recent mission.  Can discuss with him further when he is less drowsy.  Wilhemina Bonito. Eda Keys., M.D. [facility] Division of Gastroenterology

## 2023-09-26 NOTE — Evaluation (Signed)
Occupational Therapy Evaluation Patient Details Name: Philip Humphrey MRN: [id number] DOB: 1961/12/13 Today's Date: 09/26/2023   History of Present Illness 62 y.o. male presents to [facility] hospital on 09/24/2023 after syncopal episode with chest pain. Pt found to be anemic. PMH includes: hemochromatosis, B12 deficiency, HTN, alcohol abuse, hx of leaving AMA, neuropathy, R THA.   Clinical Impression   Pt was very lethargic in session and required multiple cues to keep eyes open in session. Pt was unaware they had urinated the bed and required hygiene. Pt required max/total for LB hygiene and moderate to max assist for UB dressing/hygiene at bed level. Acute Occupational Therapy will continue to follow. Patient will benefit from continued inpatient follow up therapy, <3 hours/day.        If plan is discharge home, recommend the following: Two people to help with walking and/or transfers;Two people to help with bathing/dressing/bathroom;Assistance with cooking/housework;Direct supervision/assist for medications management;Direct supervision/assist for financial management;Assist for transportation    Functional Status Assessment  Patient has had a recent decline in their functional status and demonstrates the ability to make significant improvements in function in a reasonable and predictable amount of time.  Equipment Recommendations   (TBD)    Recommendations for Other Services       Precautions / Restrictions Precautions Precautions: Fall Precaution Comments: history of dizziness as well as orthostasis. Pt does not mobilize quickly enough this session for accurate assessment of orthostatic BP. Dizziness during session appears unrelated to changes in head position Restrictions Weight Bearing Restrictions: No      Mobility Bed Mobility Overal bed mobility: Needs Assistance Bed Mobility: Rolling Rolling: Mod assist, Used rails         General bed mobility comments: deffered  transfers as difficulty of following one step cmands at this time    Transfers                   General transfer comment: deffered due to level in ability to particiapte      Balance Overall balance assessment: Needs assistance                                         ADL either performed or assessed with clinical judgement   ADL Overall ADL's : Needs assistance/impaired Eating/Feeding: Moderate assistance;Sitting   Grooming: Wash/dry hands;Wash/dry face;Moderate assistance;Maximal assistance;Sitting   Upper Body Bathing: Moderate assistance;Maximal assistance;Bed level   Lower Body Bathing: Total assistance;Bed level   Upper Body Dressing : Moderate assistance;Bed level   Lower Body Dressing: Maximal assistance;Bed level       Toileting- Clothing Manipulation and Hygiene: Total assistance;Bed level         General ADL Comments: deffered transfers to next session     Vision         Perception         Praxis         Pertinent Vitals/Pain Pain Assessment Pain Assessment: No/denies pain     Extremity/Trunk Assessment Upper Extremity Assessment Upper Extremity Assessment: Generalized weakness (difficult to assess due to level of lethargic)   Lower Extremity Assessment Lower Extremity Assessment: Defer to PT evaluation   Cervical / Trunk Assessment Cervical / Trunk Assessment: Normal   Communication Communication Communication: No apparent difficulties   Cognition Arousal: Lethargic, Suspect due to medications Behavior During Therapy: Flat affect Overall Cognitive Status: Impaired/Different from baseline Area of Impairment: Orientation,  Attention, Following commands, Safety/judgement, Awareness, Problem solving                 Orientation Level: Disoriented to, Situation Current Attention Level: Sustained Memory: Decreased recall of precautions, Decreased short-term memory Following Commands: Follows one step  commands inconsistently Safety/Judgement: Decreased awareness of safety, Decreased awareness of deficits Awareness: Emergent Problem Solving: Slow processing, Decreased initiation, Difficulty sequencing, Requires verbal cues       General Comments       Exercises     Shoulder Instructions      Home Living Family/patient expects to be discharged to:: Private residence Living Arrangements: Alone Available Help at Discharge: Family;Available PRN/intermittently Type of Home: House Home Access: Stairs to enter [employer] of Steps: 5 Entrance Stairs-Rails: Left Home Layout: One level     Bathroom Shower/Tub: Chief Strategy Officer: Standard     Home Equipment: Agricultural consultant (2 wheels);Cane - single point;Tub bench;Grab bars - toilet          Prior Functioning/Environment Prior Level of Function : Independent/Modified Independent;History of Falls (last six months)             Mobility Comments: pt reports ambulating with RW vs SPC at baseline, extensive history of falls in recent years ADLs Comments: Driving, IND. I feel most comfortable in the car, like the motion evens out my dizziness.        OT Problem List: Decreased strength;Decreased activity tolerance;Impaired balance (sitting and/or standing);Decreased safety awareness;Decreased knowledge of use of DME or AE;Cardiopulmonary status limiting activity      OT Treatment/Interventions: Self-care/ADL training;Therapeutic activities;Patient/family education;Balance training;Cognitive remediation/compensation    OT Goals(Current goals can be found in the care plan section) Acute Rehab OT Goals Patient Stated Goal: unable to report at this time OT Goal Formulation: With patient Time For Goal Achievement: 10/10/23 Potential to Achieve Goals: Good  OT Frequency: Min 1X/week    Co-evaluation              AM-PAC OT "6 Clicks" Daily Activity     Outcome Measure Help from another person  eating meals?: A Lot Help from another person taking care of personal grooming?: A Lot Help from another person toileting, which includes using toliet, bedpan, or urinal?: Total Help from another person bathing (including washing, rinsing, drying)?: A Lot Help from another person to put on and taking off regular upper body clothing?: A Lot Help from another person to put on and taking off regular lower body clothing?: A Lot 6 Click Score: 11   End of Session Nurse Communication: Mobility status  Activity Tolerance: Patient limited by lethargy Patient left: in bed;with call bell/phone within reach;with bed alarm set;with nursing/sitter in room  OT Visit Diagnosis: Unsteadiness on feet (R26.81);Other abnormalities of gait and mobility (R26.89);Muscle weakness (generalized) (M62.81);History of falling (Z91.81)                Time: 1203-1219 OT Time Calculation (min): 16 min Charges:  OT General Charges $OT Visit: 1 Visit OT Evaluation $OT Eval Low Complexity: 1 Low  Presley Raddle OTR/L  Acute Rehab Services  [phone] office number   Alphia Moh 09/26/2023, 12:26 PM

## 2023-09-27 DIAGNOSIS — D500 Iron deficiency anemia secondary to blood loss (chronic): Secondary | ICD-10-CM

## 2023-09-27 DIAGNOSIS — F10231 Alcohol dependence with withdrawal delirium: Secondary | ICD-10-CM

## 2023-09-27 DIAGNOSIS — R195 Other fecal abnormalities: Secondary | ICD-10-CM | POA: Diagnosis not present

## 2023-09-27 DIAGNOSIS — D539 Nutritional anemia, unspecified: Secondary | ICD-10-CM | POA: Diagnosis not present

## 2023-09-27 LAB — BASIC METABOLIC PANEL
Anion gap: 12 (ref 5–15)
BUN: <5 mg/dL — ABNORMAL LOW (ref 8–23)
CO2: 22 mmol/L (ref 22–32)
Calcium: 7.4 mg/dL — ABNORMAL LOW (ref 8.9–10.3)
Chloride: 101 mmol/L (ref 98–111)
Creatinine, Ser: 0.59 mg/dL — ABNORMAL LOW (ref 0.61–1.24)
GFR, Estimated: >60 mL/min (ref >60)
Glucose, Bld: 74 mg/dL (ref 70–99)
Potassium: 3.4 mmol/L — ABNORMAL LOW (ref 3.5–5.1)
Sodium: 135 mmol/L (ref 135–145)

## 2023-09-27 LAB — CBC
HCT: 31.6 % — ABNORMAL LOW (ref 39.0–52.0)
Hemoglobin: 10.3 g/dL — ABNORMAL LOW (ref 13.0–17.0)
MCH: 32.5 pg (ref 26.0–34.0)
MCHC: 32.6 g/dL (ref 30.0–36.0)
MCV: 99.7 fL (ref 80.0–100.0)
Platelets: 69 10*3/uL — ABNORMAL LOW (ref 150–400)
RBC: 3.17 MIL/uL — ABNORMAL LOW (ref 4.22–5.81)
RDW: 23.5 % — ABNORMAL HIGH (ref 11.5–15.5)
WBC: 17.6 10*3/uL — ABNORMAL HIGH (ref 4.0–10.5)
nRBC: 0.2 % (ref 0.0–0.2)

## 2023-09-27 LAB — URINALYSIS, W/ REFLEX TO CULTURE (INFECTION SUSPECTED)
Glucose, UA: NEGATIVE mg/dL
Hgb urine dipstick: NEGATIVE
Ketones, ur: NEGATIVE mg/dL
Leukocytes,Ua: NEGATIVE
Nitrite: POSITIVE — AB
Protein, ur: NEGATIVE mg/dL
Specific Gravity, Urine: 1.012 (ref 1.005–1.030)
pH: 7 (ref 5.0–8.0)

## 2023-09-27 LAB — PHOSPHORUS: Phosphorus: 4.2 mg/dL (ref 2.5–4.6)

## 2023-09-27 LAB — OCCULT BLOOD X 1 CARD TO LAB, STOOL: Fecal Occult Bld: POSITIVE — AB

## 2023-09-27 LAB — MAGNESIUM: Magnesium: 1.6 mg/dL — ABNORMAL LOW (ref 1.7–2.4)

## 2023-09-27 MED ORDER — POTASSIUM CHLORIDE CRYS ER 20 MEQ PO TBCR
40.0000 meq | EXTENDED_RELEASE_TABLET | Freq: Once | ORAL | Status: AC
  Administered 2023-09-27: 40 meq via ORAL
  Filled 2023-09-27: qty 2

## 2023-09-27 MED ORDER — MAGNESIUM SULFATE 2 GM/50ML IV SOLN
2.0000 g | Freq: Once | INTRAVENOUS | Status: AC
  Administered 2023-09-27: 2 g via INTRAVENOUS
  Filled 2023-09-27: qty 50

## 2023-09-27 MED ORDER — CHLORDIAZEPOXIDE HCL 5 MG PO CAPS: 5.0000 mg | ORAL_CAPSULE | Freq: Two times a day (BID) | ORAL | Status: DC

## 2023-09-27 MED ORDER — LACTATED RINGERS IV SOLN: INTRAVENOUS | Status: DC

## 2023-09-27 MED ORDER — HALOPERIDOL LACTATE 5 MG/ML IJ SOLN
5.0000 mg | Freq: Once | INTRAMUSCULAR | Status: DC | PRN
  Filled 2023-09-27: qty 1

## 2023-09-27 MED ORDER — SODIUM CHLORIDE 0.9 % IV SOLN: INTRAVENOUS | Status: DC

## 2023-09-27 NOTE — Care Management Important Message (Signed)
Important Message  Patient Details  Name: Philip Humphrey MRN: [id number] Date of Birth: [birth year]   Important Message Given:  Yes - Medicare IM     Djuan Talton 09/27/2023, 2:30 PM

## 2023-09-27 NOTE — Discharge Instructions (Signed)
In a time of Crisis: [facility]  Mobile Crisis Management provides immediate crisis response, 24/7.  Call [phone]  [facility] for MH/DD/SA [facility] is available 24 hours a day, 7 days a week. Customer Service Specialists will assist you to find a crisis provider that is well-matched with your needs. Your local number is: [phone]  [facility]al Health Urgent Care (BHUC) IOP, individual counseling, medication management [address] [place], [place] [address] [phone] Call for intake hours; Medicaid and Uninsured    Substance Use Outpatient Providers  [facility] ([facility]) Group and individual counseling. [address]  [place], [place] [address] [phone] [place]: [phone]  [place]: [phone] Medicaid and uninsured.   [facility] Offers IOP groups multiple times per week. [address] Sherian Maroon [place], [place] [address] [phone] Takes Medicaid and other insurances.   [facility]  Chemical Dependency Intensive Outpatient Program (IOP) [address] [place], [place] [address] [phone] Takes Nurse, learning disability and [place].   [facility]  IOP and Partial Hospitalization Program  [address]  [place], [place] [address] [phone] Private Insurance, [place] only for partial hospitalization  [facility] [address], [address], [place], [place] [address] [phone] Monday-Friday. Short and Long term options.  [facility]gent Care (BHUC) IOP, individual counseling, medication management [address] [place], [place] [address] [phone] Medicaid and [facility]  [facility] [address]  [place], [place] [address] [phone] Private Insurance and Self Pay   [facility] [address]  [place], [place] [address] [phone] Private Insurance, [place], and Self Pay   [facility]  [address] [place], [place] [address] [facility]  [address]  [place], [place] [address] [phone]  [facility]  [address] [place], [place] [address] [phone]      Residential Treatment Programs  [facility] ([facility] [address] [place], [place] [address] [phone] or [phone] Detox and Residential Rehab 21 days (Medicaid, private insurance, and self pay. If Medicare, will look into funding). No methadone. Call for pre-screen.   [facility]) [address]  [place], [place] [address] [phone] Detox 3-7 days (self Pay and Medicaid Limited availability). Transitional Program for females needs 60 days clean first.  Rehab Only for Males (Medicare, Medicaid, and Self Pay)-No methadone.  [facility] [address] [place], [place] [address] [phone] or [phone] Private Insurance only  [facility] PHONE: [phone] FAX: [phone] Residential program for women 21 and over for up to a year through a Christian 12-step recovery model. Self-pay.    [facility] [address] [place], [place] [address] Phone:  [phone] Must be detoxed 72 hours prior to admission; 28 day program.  Self-pay.  [facility] [address]  [place], [place] [phone] [employer], Medicare, [place] (not straight [place]). They offer assistance with transportation.   [facility] [address] [place],  [place], [place] [address] [phone] Christian Based Program. Men only. No insurance  [employer] [employer] is a substance use disorder treatment program for women, including those who are pregnant, parenting, and/or whose lives have been touched by abuse and violence. [phone])  [phone]  [facility] [address]  [place], [place] [address] Women's: [phone] Men's: [phone] No Medicaid.   [facility] [place] Locations across the [place]mainly [place] willing to help with transportation.  [phone] [employer]. [facility]  [address] [place]  [place], [place] [address] [phone] Treatment Only, must make assessment appointment, and must be sober for assessment appointment. Self pay, [facility], must be [facility] resident. No methadone.   [facility]  [address] [place], [place] [address] [phone] No pending legal charges, Long-term work program. No methadone. Call for assessment.  [facility]  [address], [place], [place] [address] [phone] or [phone] Commercial Insurance Only  [facility] - [phone] Private Insurance (no [place]). Males/Females, call to make referrals, multiple facilities.   [facility]: [facility] [address] [place], [place] [address] [phone]  [facility]-no methadone: [facility] [address] [place], [place] [address] [phone] [facility] [phone] [place], [place] For women, houses 8 residents for sober living. No Medicaid.         AA Meetings Website to locate meetings (virtually or in person): https://www.young.biz/ Phone: [phone]  Syringe Services Program: Due to COVID-19, syringe services programs are likely operating under different hours with limited or no fixed site hours. Some programs may not be operating at all. Please contact the program directly using the phone numbers provided below to see if they are still operating under COVID-19. [facility] Solution to the Opioid Problem (GCSTOP) Fixed;  mobile; peer-based;Bobbye Riggs) [phone] [email or web address] Fixed site exchange at [facility], [address] [place], [place] [address] on Wednesdays (2:00 - 5:00 pm) and Thursdays (4:00 - 8:00 pm). Pop-up mobile exchange locations: [employer], [address] [place], [place] [address] on Tuesdays (11:00 am - 1:00 pm) and Fridays (11:00 am - 1:00 pm) -[facility] - [address], [place], [place] [address] on Tuesdays (2:00 - 4:00 pm) and Fridays (2:00 - 4:00 pm) -[employer] [facility] [employer] copy - also serves Radio broadcast assistant and [employer] [place] [employer];Fixed; mobile; peer-based; Lendon Ka [phone] [email or web address] [address] [place], [place] [address] Delivery and outreach available in [place] and [place], please call for more information. Monday, Tuesday: 1:00 -7:00 pm, Thursday: 4:00 pm - 8:00 pm, Friday: 1:00 pm - 8:00 pm)  Medication-Assisted Treatment (MAT):  -[facility] [address] and surrounding areas including [place], [place], [place], [place], [address], [place], [place], [place], [place], and [place], [place]. Options include Methadone, buprenorphine or Suboxone. [address] [place] G[place], [place] [address] Phone: [phone] Mon - Fri: 5:30am - 2:00pm Sat: 5:30am -7:30am Sun: Closed Holidays: 6:00am - 8:00am  -[facility]- We use FDA-approved medications, like methadone/suboxone/sublocade, and vivitrol. These medications are then combined with customized care plans that include individual or group counseling, toxicology, and medical care directed by on-site physicians. Accepts most insurance plans, Medicaid, and private pay.  [address] [place], [place] [address] Phone: [phone] Monday-Friday 5:00 AM - 10:00 AM Saturday 6:00 AM - 8:30 AM Sunday 6:00 AM - 7:00 AM  -[facility]- [facility] is a treatment & recovery focused  program. In addition to receiving methadone medication, our clients participate in individual and group counseling as well as random drug testing. If accepted into the [facility] Opioid Program, you will be provided several intake appointments and a physical exam  [address], [place] [address] Office: [phone]  Fax: [phone]  -[facility]- We put our community members at the center of everything we do, for remote treatment services as well as in-person, from alcohol withdrawal to opioid use and more.  [address], [place], [place] [address] [phone] Monday-Wednesday: 9:00am - 5:00pm Thursday: 9:00am - 6:00pm Friday: 9:00am - 5:00pm Saturday: 9:00am - 1:00pm Sunday: Closed  -[facility] [place]) [address], [place], [place] [address] [phone]  [place] [phone] [address] [place], [place] [address]  M-W    5:00am-12:00pm Thu     5:00am-10:00am Fri       5:00am-12:00pm Sat      5:00am-8:00am Sun     Closed  $12/daily for Methadone Treatment. In a time of Crisis: [facility]  Mobile Crisis Management provides immediate crisis response, 24/7.  Call [phone]  [facility] for MH/DD/SA [facility] is available 24 hours a day, 7 days a week. Customer Service Specialists will assist you to find a crisis provider that is well-matched with your needs. Your local number is: [phone]  [facility]/Behavioral Health Urgent Care (BHUC) IOP, individual counseling, medication management [address] [place], [place] [address] [phone] Call for intake hours; Medicaid and Uninsured    Substance Use Outpatient Providers  [facility] ([facility]) Group and individual counseling. [address]  [place], [place] [address] [phone] [place]: [phone]  [place]: [phone] Medicaid and uninsured.   [facility] Offers IOP groups multiple times per week. [address] Sherian Maroon [place], [place] [address] [phone] Takes Medicaid and other insurances.   [facility]  Chemical Dependency Intensive Outpatient Program (IOP) [address] [place], [place] [address] [phone] Takes Nurse, learning disability and [place].   [facility]  IOP and Partial Hospitalization Program  [address]  [place], [place] [address] [phone] Private Insurance, [place] only for partial hospitalization  [facility] [address], [place], [place] [address] [phone] Monday-Friday. Short and Long term options.  [facility] Urgent Care (BHUC) IOP, individual counseling, medication management [address] [place], [place] [address] [phone] Medicaid and [facility]  [facility] [address]  [place], [place] [address] [phone] Private Insurance and Self Pay   [facility] Outpatient [address]  [place], [place] [address] [phone] Private Insurance, [place], and Self Pay   [facility]  [address] [place], [place] [address] [facility]  [address]  [place], [place] [address] [phone]  [facility]  [address] [place], [place] [address] [phone]      Residential Treatment Programs  [facility] ([facility] [address] [place], [place] [address] [phone] or [phone] Detox and Residential Rehab 21 days (Medicaid, private insurance, and self pay. If Medicare, will look into funding). No methadone. Call for pre-screen.   [facility]) [address]  [place], [place] [address] [phone] Detox 3-7 days (self Pay and Medicaid Limited availability). Transitional Program for females needs 60 days clean first.   Rehab Only for Males (Medicare, Medicaid, and Self Pay)-No methadone.  [facility] [address] [place], [place] [address] [phone] or [phone] Private Insurance only  [facility] PHONE: [phone] FAX: [phone] Residential  program for women 21 and over for up to a year through a Christian 12-step recovery model. Self-pay.    [facility] [address] [place], [place] [address] Phone:  [phone] Must be detoxed 72 hours prior to admission; 28 day program.  Self-pay.  [facility] [address]  [place], [place] [phone] [employer], Medicare, [place] (not straight [place]). They offer assistance with transportation.   [facility] [address] [place],  [place], [place] [address] [phone] Christian Based Program. Men only. No insurance  [facility] is a substance use disorder treatment program for women, including those who are pregnant, parenting, and/or whose lives have been touched by abuse and violence. (800) [phone]  [facility] [address] [place], [place] [address] Women's: [phone] Men's: [phone] No Medicaid.   [facility] [place] Locations across the [place] (mainly [place] willing to help with transportation.  [phone] [employer]. [facility]  [address] [place].  [place], [place] [address] [phone] Treatment Only, must make assessment appointment, and must be sober for assessment appointment. Self pay, [facility], must be [facility] resident. No methadone.   [facility]  [address] [place], [place] [address] [phone] No pending legal charges, Long-term work program. No methadone. Call for assessment.  [facility]  [address], [place], [place] [address] [phone] or [phone] Commercial Insurance Only  [facility] - [phone] Private Insurance (no [place]). Males/Females, call to make referrals, multiple facilities.   [facility]: [facility] [address] [place], [place] [address] [phone]  [facility]-no methadone: [facility] [address] [place], [place] [address] [phone] [facility] Duver[facility] [place] [facility] [phone] [place], [place] For women, houses 8 residents for sober living. No Medicaid.         AA Meetings Website to locate meetings (virtually or in person): https://www.young.biz/ Phone: [phone]  Syringe Services Program: Due to COVID-19, syringe services programs are likely operating under different hours with limited or no fixed site hours. Some programs may not be operating at all. Please contact the program directly using the phone numbers provided below to see if they are still operating under COVID-19. [facility] Solution to the Opioid Problem (GCSTOP) Fixed; mobile; peer-based;Bobbye Riggs) [phone] [email or web address] Fixed site exchange at [facility], [address] [place], [place] [address] on Wednesdays (2:00 - 5:00 pm) and Thursdays (4:00 - 8:00 pm). Pop-up mobile exchange locations: [employer], [address] [place], [place] [address] on Tuesdays (11:00 am - 1:00 pm) and Fridays (11:00 am - 1:00 pm) -[facility] - [address], [place], [place] [address] on Tuesdays (2:00 - 4:00 pm) and Fridays (2:00 - 4:00 pm) -[facility] [employer] copy - also serves Radio broadcast assistant and [employer] [place] [employer];Fixed; mobile; peer-based; Lendon Ka [phone] [email or web address] [address] [place], [place] [address] Delivery and outreach available in [place] and [place], please call for more information. Monday, Tuesday: 1:00 -7:00 pm, Thursday: 4:00 pm -  8:00 pm, Friday: 1:00 pm - 8:00 pm)  Medication-Assisted Treatment (MAT):  -[facility] [address] and surrounding areas including [place], [place], [place], [place], [address], [place], [place], [place], [place], and [place], [place]. Options include Methadone, buprenorphine or Suboxone. [address] [place], [place] [address] Phone: [phone] -  Fri: 5:30am - 2:00pm Sat: 5:30am -7:30am Sun: Closed Holidays: 6:00am - 8:00am  -[facility]- We use FDA-approved medications, like methadone/suboxone/sublocade, and vivitrol. These medications are then combined with customized care plans that include individual or group counseling, toxicology, and medical care directed by on-site physicians. Accepts most insurance plans, Medicaid, and private pay.  [address] [place], [place] [address] Phone: [phone] Monday-Friday 5:00 AM - 10:00 AM Saturday 6:00 AM - 8:30 AM Sunday 6:00 AM - 7:00 AM  -[facility]- [facility] is a treatment & recovery focused program. In addition to receiving methadone medication, our clients participate in individual and group counseling as well as random drug testing. If accepted into the [facility] Opioid Program, you will be provided several intake appointments and a physical exam [address] [place], [place] [address] Office: [phone]  Fax: [phone]  -[facility]- We put our community members at the center of everything we do, for remote treatment services as well as in-person, from alcohol withdrawal to opioid use and more.  [address], [place], [place] [address] [phone] Monday-Wednesday: 9:00am - 5:00pm Thursday: 9:00am - 6:00pm Friday: 9:00am - 5:00pm Saturday: 9:00am - 1:00pm Sunday: Closed  -[facility] [place]) [address] [place], [place] [address] [phone]  [place] [phone] [address] [place], [place] [address]  M-W    5:00am-12:00pm Thu     5:00am-10:00am Fri       5:00am-12:00pm Sat      5:00am-8:00am Sun     Closed  $12/daily for Methadone Treatment.

## 2023-09-27 NOTE — Progress Notes (Addendum)
Patient ID: Philip Humphrey, male   DOB: 12-Sep-1961, 62 y.o.   MRN: [id number]    Progress Note   Subjective   Day # 3 CC; heme positive stool/ anemia-history of B12 and folate deficiency in setting of chronic EtOH abuse  Patient confused and agitated throughout the night /4 bowel movements last night,brownish  black. Patient sleeping soundly currently, did not awaken to voice  HGB 5.9 on admit-up to 9.6 yesterday post 3 units-10.3 today Platelets 69 Potassium 3.4/BUN 5/creatinine 0.59 MG1.6 INR 1.2  Colonoscopy 2016/Dr. Ezzard Standing negative     Objective   Vital signs in last 24 hours: Temp:  [97.8 F (36.6 C)-98.3 F (36.8 C)] 98.2 F (36.8 C) (10/07 0752) Pulse Rate:  [90-94] 94 (10/07 0752) Resp:  [15-20] 15 (10/07 0752) BP: (114-145)/(86-96) 145/94 (10/07 0752) SpO2:  [94 %-100 %] 96 % (10/07 0752) Last BM Date : 09/27/23 General: Older WM in NAD-sleeping soundly, snoring Heart: Tachycardic /regular rate and rhythm; no murmurs Lungs: Respirations even and unlabored, lungs CTA bilaterally Abdomen:  Soft, nontender and nondistended. Normal bowel sounds. Extremities:  Without edema. Neurologic: Had been agitated and awake all night, uncooperative, sedated and sleeping now  Intake/Output from previous day: 10/06 0701 - 10/07 0700 In: 240 [P.O.:240] Out: 200 [Urine:200] Intake/Output this shift: Total I/O In: 240 [P.O.:240] Out: -   Lab Results: Recent Labs    09/25/23 0946 09/26/23 0444 09/27/23 0416  WBC 16.2* 14.8* 17.6*  HGB 10.3* 9.6* 10.3*  HCT 30.5* 29.0* 31.6*  PLT 45* 54* 69*   BMET Recent Labs    09/25/23 0946 09/26/23 0444 09/27/23 0416  NA 136 135 135  K 3.1* 3.8 3.4*  CL 100 101 101  CO2 27 25 22   GLUCOSE 96 93 74  BUN 8 5* <5*  CREATININE 0.73 0.72 0.59*  CALCIUM 7.8* 7.5* 7.4*   LFT Recent Labs    09/26/23 0444  PROT 4.9*  ALBUMIN 2.6*  AST 42*  ALT 18  ALKPHOS 180*  BILITOT 2.5*  BILIDIR 1.0*  IBILI 1.5*    PT/INR Recent Labs    09/24/23 1943 09/26/23 0444  LABPROT 16.2* 15.3*  INR 1.3* 1.2       Assessment / Plan:    #83 62 year old white male with history of chronic EtOH abuse hepatic steatosis, hereditary hemochromatosis and history of B12 and folate deficiency, admitted 3 days ago after a fall and complaints of dizziness, found to have hemoglobin of 5.9, and heme positive stool without overt bleeding  Hemoglobin was 8.25 July 2023.  Transfused 3 units of packed RBCs and hemoglobin stable since admission  Etiology of heme positive stool is not clear, he may have EtOH related gastropathy, versus occult colonic pathology/neoplasm  Anemia may be multifactoral with history of B12 and folate deficiency  #2 EtOH withdrawal-early on CIWA protocol-is tachycardic and hypertensive #3 multiple electrolyte derangements-being corrected #4 chest pain complaint on admit-troponins 115> 117, no ischemic changes on EKG  #5 elevated LFTs-no evidence of cirrhosis on CT or ultrasound but does have thrombocytopenia May have  component of EtOH hepatitis  Plan; patient will need EGD and colonoscopy he is agreeable once EtOH withdrawal resolves Continue p.o. twice daily Protonix     Principal Problem:   Macrocytic anemia Active Problems:   Hypothyroidism   Essential hypertension   Hereditary hemochromatosis (HCC)   Hypokalemia   Acute on chronic anemia   Chronic idiopathic thrombocytopenia (HCC)   Heme positive stool     LOS: 3  days   Amy EsterwoodPA-C  09/27/2023, 11:42 AM  I have taken an interval history, thoroughly reviewed the chart and examined the patient. I agree with the Advanced Practitioner's note, impression and recommendations, and have recorded additional findings, impressions and recommendations below. I performed a substantive portion of this encounter (>50% time spent), including a complete performance of the medical decision making.  My additional thoughts are as  follows:  Signout received from colleagues, extensive chart review performed.  Patient examined, though he is stuporous today and unable to give any additional history.  Case discussed with APP.  Acute on chronic multifactorial anemia, underlying alcohol abuse, currently in alcohol withdrawal.  Hemoglobin stable after initial transfusion.  There have been tentative plans for endoscopic testing prior to discharge, however we have also just learned from the patient's hospitalist that his family report an upper endoscopy done within the last few weeks at the [facility].  She will attempt to bring the report for review but does not recall any serious findings.  That, and the lack of overt GI bleeding at present certainly decreases the urgency of endoscopic testing for this patient.  We will check back periodically in hopes that his mental status will clear soon and we can reconsider the timing and need as well as appropriate location (inpatient versus outpatient) for further endoscopic testing.     Philip Humphrey [phone]

## 2023-09-27 NOTE — Progress Notes (Signed)
[facility] night cross cover note:   I was notified by RN that this patient, after receiving 3 doses of Ativan remains agitated, pulling at leads, attempting to get out of bed, with these behaviors refractory to attempts at verbal redirection.  I have subsequently placed an order for Haldol 5 mg IV x 1 dose prn for agitation.     Newton Pigg, DO Hospitalist

## 2023-09-27 NOTE — Progress Notes (Signed)
PROGRESS NOTE    Philip Humphrey  WUJ:[id number] DOB: [birth year] DOA: 09/24/2023 PCP: Courtney Paris, NP   Chief Complaint  Patient presents with   Fall    Brief Narrative:   This is a 62 year old male with past medical history of alcohol abuse, chronic hypotension, chronic thrombocytopenia, hereditary hematochromatosis, hypothyroidism, alcohol abuse.  He was brought in today after he became dizzy and passed out while in his yard.  He states he has been having increased episodes of dizziness and syncopal episode in the past 2 or 3 months.  He endorses some epigastric discomfort.  He additionally endorses chest pains which she states started on the right side and then traveled to the left that up his left jaw.  He describes the pain is a significant 8/10 but not long-lasting.  It can occur while he is at rest or with activity.  He states sometimes associated with syncope sometimes.  He describes the pain as burning/heavy.  Current pain level 0/10.  Patient has a history of alcohol abuse, drinking bourbon daily.  He states he has not drank in the last 2 days.  He denies having history of withdrawal symptoms.  Patient reports a history of gastric ulcer.   The patient was recently admitted 8/2 to 8/5 with posterior dizziness and near syncope.  At that point patient's hemoglobin was 6.6.  He received 1 unit of blood.  GI was consulted. EGD/colonoscopy recommended.  Patient refused and signed out AMA.    Today in the ER patient's hemoglobin 5.9, platelets 49, WBC 13.8, MCV 115.6, folate 4.3, iron 66 (normal).  Potassium 2.7, phosphorus 1.4, magnesium 1.2.  Troponin 115=>117.  EKG NSR without acute ischemic changes.  INR 1.3, fecal occult positive.  Alcohol level normal  Assessment & Plan:   Principal Problem:   Macrocytic anemia Active Problems:   Hypokalemia   Acute on chronic anemia   Hypothyroidism   Essential hypertension   Hereditary hemochromatosis (HCC)   Chronic idiopathic  thrombocytopenia (HCC)   Heme positive stool  Syncope and collapse -This appears to be multifactorial, the setting of soft blood pressure, multiple electrolyte abnormalities, and symptomatic anemia -Correct severe electrolyte derangement -Continue to monitor on telemetry, no significant events so far -Continue with IV fluids -Fall precautions,  consulted PT and OT  Symptomatic anemia -Patient with anemia with hemoglobin of 5.9, received 3 units on admission.hgb remains stable -Neuroplastic anemia, as well concern of chronic blood loss anemia in the setting of GI bleed with positive Hemoccult stool -Continue with IV Protonix 40 mg IV twice daily -Replace folic acid -GI consult greatly appreciated -Continue to monitor CBC and transfuse as needed. -I have discussed with the daughter today, she told me patient had endoscopy at [facility] couple weeks ago, she will get [place] the report, thanks.    Megaloblastic anemia Folic acid deficiency.   -Continue with folic acid supplements  Hypokalemia Hypomagnesemia Hypophosphatemia Hypocalcemia -Being replaced, continue to monitor closely   Transaminitis Coagulopathy -due to alcohol abuse,  - VIT K ordered by GI  Elevated troponin -Likely secondary to patient' severe and prolonged anemia -2D echo in a.m.   Alcohol abuse Alcohol withdrawals with DT -CIWA protocol ordered, he required ativan early am , so  -He is on scheduled Librium, I will decrease dosing as he is more sleepy today as he required Ativan on top of his Librium.   -Continue thiamine and folic acid   Chronic thrombocytopenia (HCC) -Likely related to alcohol abuse and hepatic dysfunction.  No recorded liver cirrhosis.  Will order a right upper quadrant ultrasound   Chronic hypotension -Patient maintained on midodrine.  -  Patient currently hypotensive but asymptomatic.  Transfusing and reordering midodrine,   Hereditary hemochromatosis (HCC) -Per oncology    Hypothyroidism -Synthroid resumed  Left eye conjunctivitis -Continue with Cipro eyedrops  Hypokalemia Hypomagnesemia -Repleted     DVT prophylaxis: SCD Code Status: Full code Family Communication: Discussed with daughter by phone Disposition:   Status is: Inpatient    Consultants:  GI Subjective:  Patient required extra Ativan dosing this morning secondary to withdrawals  Objective: Vitals:   09/27/23 0000 09/27/23 0400 09/27/23 0752 09/27/23 1200  BP: 114/87 (!) 122/90 (!) 145/94 (!) 139/100  Pulse: 91 93 94 98  Resp: 20 20 15 18   Temp: 98.3 F (36.8 C) 98 F (36.7 C) 98.2 F (36.8 C) 98.1 F (36.7 C)  TempSrc: Oral Oral Oral Oral  SpO2: 95% 95% 96% 98%    Intake/Output Summary (Last 24 hours) at 09/27/2023 1308 Last data filed at 09/27/2023 0800 Gross per 24 hour  Intake 480 ml  Output 200 ml  Net 280 ml   There were no vitals filed for this visit.  Examination:   He is somnolent at this morning, he received Ativan secondary to withdrawals Symmetrical Chest wall movement, Good air movement bilaterally, CTAB RRR,No Gallops,Rubs or new Murmurs, No Parasternal Heave +ve B.Sounds, Abd Soft, No tenderness, No rebound - guarding or rigidity. No Cyanosis, Clubbing or edema, No new Rash or bruise       Data Reviewed: I have personally reviewed following labs and imaging studies  CBC: Recent Labs  Lab 09/24/23 1930 09/24/23 1945 09/25/23 0946 09/26/23 0444 09/27/23 0416  WBC  --  13.8* 16.2* 14.8* 17.6*  NEUTROABS  --  11.0*  --   --   --   HGB 6.5* 5.9* 10.3* 9.6* 10.3*  HCT 19.0* 18.5* 30.5* 29.0* 31.6*  MCV  --  115.6* 96.5 97.3 99.7  PLT  --  49* 45* 54* 69*    Basic Metabolic Panel: Recent Labs  Lab 09/24/23 1930 09/24/23 1945 09/24/23 2110 09/25/23 0946 09/26/23 0444 09/27/23 0416  NA 138 139  --  136 135 135  K 2.7* 2.7*  --  3.1* 3.8 3.4*  CL 96* 96*  --  100 101 101  CO2  --  27  --  27 25 22   GLUCOSE 76 75  --  96 93 74   BUN 10 11  --  8 5* <5*  CREATININE 0.80 0.95  --  0.73 0.72 0.59*  CALCIUM  --  8.2*  --  7.8* 7.5* 7.4*  MG  --  1.2*  --  1.6* 2.0 1.6*  PHOS  --   --  1.4* 1.4* 2.1* 4.2    GFR: CrCl cannot be calculated (Unknown ideal weight.).  Liver Function Tests: Recent Labs  Lab 09/24/23 1945 09/26/23 0444  AST 49* 42*  ALT 23 18  ALKPHOS 195* 180*  BILITOT 2.1* 2.5*  PROT 5.6* 4.9*  ALBUMIN 3.0* 2.6*    CBG: No results for input(s): "GLUCAP" in the last 168 hours.   Recent Results (from the past 240 hour(s))  MRSA Next Gen by PCR, Nasal     Status: None   Collection Time: 09/25/23  2:52 AM   Specimen: Nasal Mucosa; Nasal Swab  Result Value Ref Range Status   MRSA by PCR Next Gen NOT DETECTED NOT DETECTED Final  Comment: (NOTE) The GeneXpert MRSA Assay (FDA approved for NASAL specimens only), is one component of a comprehensive MRSA colonization surveillance program. It is not intended to diagnose MRSA infection nor to guide or monitor treatment for MRSA infections. Test performance is not FDA approved in patients less than 48 years old. Performed at [facility], [address], [place], [place] [address]          Radiology Studies: No results found.      Scheduled Meds:  chlordiazePOXIDE  5 mg Oral QID   ciprofloxacin  2 drop Both Eyes Q2H while awake   folic acid  1 mg Oral Daily   levothyroxine  175 mcg Oral Q0600   midodrine  5 mg Oral TID   pantoprazole  40 mg Oral BID   sertraline  50 mg Oral QHS   thiamine  100 mg Oral Daily   Or   thiamine  100 mg Intravenous Daily   Continuous Infusions:     LOS: 3 days        Huey Bienenstock, MD [facility]   To contact the attending provider between 7A-7P or the covering provider during after hours 7P-7A, please log into the web site [email or web address] and access using universal [place] password for that web site. If you do not have the password, please call the hospital  operator.  09/27/2023, 1:08 PM

## 2023-09-27 NOTE — Plan of Care (Signed)

## 2023-09-27 NOTE — Progress Notes (Signed)
Patient is more confused and agitated throughout the night. Placed safety mitts.Also, He had 4 Bms throughout the night, incontinent,  Type 6, Small-moderate in amount, brown-black in color. Stool specimen sent to lab.

## 2023-09-27 NOTE — TOC Initial Note (Addendum)
Transition of Care [facility]) - Initial/Assessment Note    Patient Details  Name: Philip Humphrey MRN: [id number] Date of Birth: Jul 14, 1961  Transition of [facility]) CM/SW Contact:    Mearl Latin, LCSW Phone Number: 09/27/2023, 9:05 AM  Clinical Narrative:                 Patient admitted from home with ETOH use history. CSW acknowledges SA and SNF consult and will assess once less agitated and more oriented. SA resources placed on AVS in the event patient does not become more oriented during this hospital stay.   Expected Discharge Plan: Skilled Nursing Facility Barriers to Discharge: Continued Medical Work up (Agitated; mitts)   Patient Goals and CMS Choice            Expected Discharge Plan and Services In-house Referral: Clinical Social Work     Living arrangements for the past 2 months: Single Family Home                                      Prior Living Arrangements/Services Living arrangements for the past 2 months: Single Family Home Lives with:: Self Patient language and need for interpreter reviewed:: Yes        Need for Family Participation in Patient Care: Yes (Comment) Care giver support system in place?: Yes (comment) Current home services: DME Criminal Activity/Legal Involvement Pertinent to Current Situation/Hospitalization: No - Comment as needed  Activities of Daily Living   ADL Screening (condition at time of admission) Independently performs ADLs?: Yes (appropriate for developmental age) Is the patient deaf or have difficulty hearing?: No Does the patient have difficulty seeing, even when wearing glasses/contacts?: No Does the patient have difficulty concentrating, remembering, or making decisions?: No  Permission Sought/Granted                  Emotional Assessment Appearance:: Appears stated age Attitude/Demeanor/Rapport: Unable to Assess Affect (typically observed): Unable to Assess Orientation: : Oriented to Self,  Oriented to Place, Oriented to Situation Alcohol / Substance Use: Alcohol Use Psych Involvement: No (comment)  Admission diagnosis:  Symptomatic anemia [D64.9] Patient Active Problem List   Diagnosis Date Noted   Heme positive stool 09/26/2023   Macrocytic anemia 09/24/2023   Postural dizziness 07/24/2023   Dizziness 07/23/2023   Leukocytosis 07/23/2023   Acute on chronic anemia 07/23/2023   Chronic idiopathic thrombocytopenia (HCC) 07/23/2023   Hepatic steatosis 07/23/2023   Vitamin B12 deficiency 07/23/2023   GERD (gastroesophageal reflux disease) 07/23/2023   Hypotensive episode 07/23/2023   Postural dizziness with near syncope 07/23/2023   Hypophosphatemia 07/23/2023   Hypomagnesemia 07/23/2023   Syncope 08/10/2022   Orthostatic hypotension 08/10/2022   Diarrhea 08/10/2022   Chronic alcohol use 08/10/2022   Hyperlipidemia 08/10/2022   Peripheral neuropathy 08/10/2022   Fall 04/12/2022   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   AKI (acute kidney injury) (HCC) 03/03/2022   Hyponatremia 03/03/2022   Hypokalemia 03/03/2022   Acute metabolic acidosis 03/03/2022   Elevated LFTs 03/03/2022   Macrocytosis 03/03/2022   Thrombocytopenia (HCC) 03/03/2022   Hereditary hemochromatosis (HCC) 07/17/2021   Alcohol abuse 07/07/2021   Pancreatitis 07/07/2021   Acute kidney injury (HCC) 07/07/2021   Acute pancreatitis 07/07/2021   Status post total hip replacement, 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   Obese 11/25/2016   S/P left THA, AA 11/24/2016   Hypothyroidism 04/12/2012   Essential hypertension 04/12/2012   History  of gastritis 04/12/2012   History of tobacco use 04/12/2012   Overweight 04/12/2012   PCP:  Courtney Paris, NP Pharmacy:   [id number] [place], [place] - [address] [place] [address] Phone: [phone] Fax: [phone]  [facility] [address] [place] [address] Phone: [phone] Fax:  [phone]     Social Determinants of Health (SDOH) Social History: SDOH Screenings   Food Insecurity: No Food Insecurity (09/24/2023)  Housing: Low Risk  (09/24/2023)  Transportation Needs: No Transportation Needs (09/24/2023)  Utilities: Not At Risk (09/24/2023)  Depression (PHQ2-9): Low Risk  (09/15/2022)  Tobacco Use: Medium Risk (09/24/2023)   SDOH Interventions:     Readmission Risk Interventions    07/26/2023   12:50 PM  Readmission Risk Prevention Plan  Transportation Screening Complete  PCP or Specialist Appt within 5-7 Days Complete  Home Care Screening Complete  Medication Review (RN CM) Complete

## 2023-09-28 ENCOUNTER — Inpatient Hospital Stay (HOSPITAL_COMMUNITY): Payer: Medicare Other

## 2023-09-28 DIAGNOSIS — Z7189 Other specified counseling: Secondary | ICD-10-CM

## 2023-09-28 DIAGNOSIS — F10231 Alcohol dependence with withdrawal delirium: Secondary | ICD-10-CM | POA: Diagnosis not present

## 2023-09-28 DIAGNOSIS — Z515 Encounter for palliative care: Secondary | ICD-10-CM | POA: Diagnosis not present

## 2023-09-28 DIAGNOSIS — D500 Iron deficiency anemia secondary to blood loss (chronic): Secondary | ICD-10-CM | POA: Diagnosis not present

## 2023-09-28 DIAGNOSIS — D539 Nutritional anemia, unspecified: Secondary | ICD-10-CM | POA: Diagnosis not present

## 2023-09-28 LAB — CBC
HCT: 31.7 % — ABNORMAL LOW (ref 39.0–52.0)
Hemoglobin: 10.5 g/dL — ABNORMAL LOW (ref 13.0–17.0)
MCH: 32.7 pg (ref 26.0–34.0)
MCHC: 33.1 g/dL (ref 30.0–36.0)
MCV: 98.8 fL (ref 80.0–100.0)
Platelets: 91 10*3/uL — ABNORMAL LOW (ref 150–400)
RBC: 3.21 MIL/uL — ABNORMAL LOW (ref 4.22–5.81)
RDW: 22.5 % — ABNORMAL HIGH (ref 11.5–15.5)
WBC: 24.1 10*3/uL — ABNORMAL HIGH (ref 4.0–10.5)
nRBC: 0 % (ref 0.0–0.2)

## 2023-09-28 LAB — BASIC METABOLIC PANEL
Anion gap: 10 (ref 5–15)
BUN: <5 mg/dL — ABNORMAL LOW (ref 8–23)
CO2: 25 mmol/L (ref 22–32)
Calcium: 7.7 mg/dL — ABNORMAL LOW (ref 8.9–10.3)
Chloride: 99 mmol/L (ref 98–111)
Creatinine, Ser: 0.63 mg/dL (ref 0.61–1.24)
GFR, Estimated: >60 mL/min (ref >60)
Glucose, Bld: 82 mg/dL (ref 70–99)
Potassium: 3.7 mmol/L (ref 3.5–5.1)
Sodium: 134 mmol/L — ABNORMAL LOW (ref 135–145)

## 2023-09-28 LAB — PHOSPHORUS: Phosphorus: 3.2 mg/dL (ref 2.5–4.6)

## 2023-09-28 LAB — PROCALCITONIN: Procalcitonin: 0.78 ng/mL

## 2023-09-28 LAB — MAGNESIUM: Magnesium: 1.5 mg/dL — ABNORMAL LOW (ref 1.7–2.4)

## 2023-09-28 NOTE — Plan of Care (Signed)

## 2023-09-28 NOTE — Plan of Care (Signed)

## 2023-09-28 NOTE — Consult Note (Signed)
Consultation Note Date: 09/28/2023   Patient Name: Philip Humphrey  DOB: [birth year]  MRN: [id number]  Age / Sex: 63 y.o., male  PCP: Courtney Paris, NP Referring Physician: Starleen Arms, MD  Reason for Consultation: Establishing goals of care  HPI/Patient Profile: 62 y.o. male   admitted on 09/24/2023 with   past medical history of alcohol abuse, chronic hypotension, chronic thrombocytopenia, hereditary hematochromatosis, hypothyroidism, alcohol abuse.    He was brought in today after he became dizzy and passed out while in his yard. Reported increased episodes of dizziness and syncopal episode in the past 2 or 3 months.    The patient was recently admitted 8/2 to 8/5 with posterior dizziness and near syncope.  At that point patient's hemoglobin was 6.6.  He received 1 unit of blood.  GI was consulted. EGD/colonoscopy recommended.  Patient refused and signed out AMA.   Patient has significant history of alcohol abuse.    Patient and family face treatment option decisions, advanced directive decisions and anticipatory care needs   Clinical Assessment and Goals of Care:   This NP Lorinda Creed reviewed medical records, received report from team, assessed the patient and then meet at the patient's bedside along with his daughter /and patient's brother Brett Canales to discuss diagnosis, prognosis, GOC, EOL wishes disposition and options.   Concept of Palliative Care was introduced as specialized medical care for people and their families living with serious illness.  If focuses on providing relief from the symptoms and stress of a serious illness.  The goal is to improve quality of life for both the patient and the family.Values and goals of care important to patient and family were attempted to be elicited.  Created space and opportunity for patient  and family to explore thoughts and feelings regarding current  medical situation        Family report a long, hard, difficult situation secondary to [name] alcohol abuse  .  His brother reports what he believes to be underlying psychiatric issues for most of his life.  They describe episodes that are both manic and depressive.    He has been on medical disability for many years secondary to work injuries.  His alcohol use increased dramatically over the last 4 years since the death of his wife.  Family report sadness and frustration with the whole situation.  They have tried to support and encourage many attempts at rehabilitation.  [name]/daughter is patient's only child.  Understandably she is torn with setting tight boundaries and being his daughter.   Education offered on benefits of Al-Anon.    (Making the situation even more difficult is that patient's daughter/ Philip Humphrey  is getting married this weekend)  Family describe situation in the past as "patient is hospitalized, then he is stabilized and then he is discharged back to the same situation"     Patient has never successfully completed any kind of rehab attempt.   Education offered on the difference between capacity and competency.  Currently patient does not have  capacity to make his own decisions.  A  discussion was had today regarding advanced directives.  Concepts specific to code status, artifical feeding and hydration, continued IV antibiotics and rehospitalization was had.    The difference between a aggressive medical intervention path  and a palliative comfort care path for this patient at this time was had.     Education offered on hospice benefit; philosophy and eligibility    MOST form introduced    Natural trajectory and expectations at EOL were discussed.  Questions and concerns addressed.  Patient  encouraged to call with questions or concerns.     PMT will continue to support holistically.         Patient does have aged POA and living well his daughter is his H  POA       SUMMARY OF RECOMMENDATIONS    Code Status/Advance Care Planning: Full code Continue to treat the treatable, family is hopeful for improvement to the point that patient will be able to have conversation with his  family and healthcare providers, hoping to outline future goals of care    Palliative Prophylaxis:  Aspiration, Delirium Protocol, and Frequent Pain Assessment  Additional Recommendations (Limitations, Scope, Preferences): Full Scope Treatment  Psycho-social/Spiritual:  Desire for further Chaplaincy support:no Additional Recommendations: Education on Hospice  Prognosis:  Unable to determine  Discharge Planning: To Be Determined      Primary Diagnoses: Present on Admission:  Chronic idiopathic thrombocytopenia (HCC)  Acute on chronic anemia  Essential hypertension  Hereditary hemochromatosis (HCC)  Hypokalemia  Hypothyroidism  Macrocytic anemia   I have reviewed the medical record, interviewed the patient and family, and examined the patient. The following aspects are pertinent.  Past Medical History:  Diagnosis Date   Allergy    occ. seasonal   Anxiety    Arthritis    Colon polyp    Condyloma    anal   Depression    GERD (gastroesophageal reflux disease)    History of transfusion    as child   Hypertension    Knee injury    right knee cap w piece broken off- MRI done 02-06-15-pending surgery. 11-18-16 right knee is still painful, both shoulders(limited ROM right).   Knee pain    Neuropathy    bilateral- greater left   Palpitations    normal stress test 10 yrs ago   Pneumonia    Thyroid disease    Thyroid removed unintentionally at age 62- no further thyroid tissue remains-uses daily supplement   Social History   Socioeconomic History   Marital status: Single    Spouse name: Not on file   Number of children: Not on file   Years of education: Not on file   Highest education level: Not on file  Occupational History   Not on  file  Tobacco Use   Smoking status: Former    Types: Cigars   Smokeless tobacco: Never   Tobacco comments:    occasional quit 2012  Vaping Use   Vaping status: Never Used  Substance and Sexual Activity   Alcohol use: Yes    Comment: drinking liquor each night recently   Drug use: No   Sexual activity: Yes  Other Topics Concern   Not on file  Social History Narrative   Not on file   Social Determinants of Health   Financial Resource Strain: Not on file  Food Insecurity: No Food Insecurity (09/24/2023)   Hunger Vital Sign    Worried About Running  Out of Food in the Last Year: Never true    Ran Out of Food in the Last Year: Never true  Transportation Needs: No Transportation Needs (09/24/2023)   PRAPARE - Administrator, Civil Service (Medical): No    Lack of Transportation (Non-Medical): No  Physical Activity: Not on file  Stress: Not on file  Social Connections: Not on file   Family History  Problem Relation Age of Onset   Heart disease Mother    Cancer Brother        Lung   Lung disease Brother    Stroke Father    Scheduled Meds:  chlordiazePOXIDE  5 mg Oral BID   ciprofloxacin  2 drop Both Eyes Q2H while awake   folic acid  1 mg Oral Daily   levothyroxine  175 mcg Oral Q0600   midodrine  5 mg Oral TID   pantoprazole  40 mg Oral BID   sertraline  50 mg Oral QHS   thiamine  100 mg Oral Daily   Or   thiamine  100 mg Intravenous Daily   Continuous Infusions:  sodium chloride 75 mL/hr at 09/28/23 0651   PRN Meds:.acetaminophen **OR** acetaminophen, haloperidol lactate, senna-docusate Medications Prior to Admission:  Prior to Admission medications   Medication Sig Start Date End Date Taking? Authorizing Provider  atorvastatin (LIPITOR) 80 MG tablet Take 80 mg by mouth daily. 10/09/16  Yes [provider]  bismuth subsalicylate (PEPTO BISMOL) 262 MG/15ML suspension Take 30 mLs by mouth every 6 (six) hours as needed for indigestion (stomach  pain).   Yes [provider]  Cyanocobalamin (B-12) 1000 MCG SUBL Place 1,000 mcg under the tongue daily. 07/26/22  Yes Georga Kaufmann T, PA-C  diclofenac Sodium (VOLTAREN) 1 % GEL Apply 1 application topically 2 (two) times daily as needed (pain).   Yes [provider]  diphenhydramine-acetaminophen (TYLENOL PM) 25-500 MG TABS tablet Take 2-3 tablets by mouth at bedtime as needed (for sleep).   Yes [provider]  famotidine (PEPCID) 40 MG tablet Take 40 mg by mouth daily as needed for heartburn or indigestion. 01/25/21  Yes [provider]  folic acid (FOLVITE) 1 MG tablet Take 1 tablet (1 mg total) by mouth daily. 04/14/22  Yes Leroy Sea, MD  gabapentin (NEURONTIN) 300 MG capsule Take 300-600 mg by mouth 2 (two) times daily. Take 1 capsule (300 mg) in the morning and Take 2 capsules (600 mg) at bedtime   Yes [provider]  KLOR-CON M20 20 MEQ tablet Take 20 mEq by mouth daily. 08/02/23  Yes [provider]  lisinopril (ZESTRIL) 20 MG tablet Take 20 mg by mouth daily. 08/02/22  Yes [provider]  Magnesium 400 MG TABS Take 400 mg by mouth daily.   Yes [provider]  MELATONIN PO Take 1 tablet by mouth at bedtime as needed (sleep).   Yes [provider]  midodrine (PROAMATINE) 2.5 MG tablet Take 5 mg by mouth 3 (three) times daily. 09/03/22  Yes [provider]  nitroGLYCERIN (NITROSTAT) 0.4 MG SL tablet Place 0.4 mg under the tongue every 5 (five) minutes as needed for chest pain.   Yes [provider]  olmesartan (BENICAR) 20 MG tablet Take 20 mg by mouth daily. 08/14/22  Yes [provider]  sertraline (ZOLOFT) 50 MG tablet Take 50 mg by mouth at bedtime.   Yes [provider]  SYNTHROID 175 MCG tablet Take 1 tablet (175 mcg total) by  mouth daily. NO MORE REFILLS WITHOUT OFFICE VISIT - 2ND NOTICE 11/14/14  Yes Tonye Pearson, MD  traZODone (DESYREL) 50 MG tablet Take 1  tablet (50 mg total) by mouth at bedtime. 09/15/22  Yes Bing Neighbors, NP  Vitamin D, Ergocalciferol, (DRISDOL) 1.25 MG (50000 UNIT) CAPS capsule Take 50,000 Units by mouth every 7 (seven) days. 05/14/21  Yes [provider]   Allergies  Allergen Reactions   Sulfa Antibiotics Anaphylaxis, Swelling and Other (See Comments)    Mouth and throat swelling   Oxycodone Itching   Nickel Rash and Other (See Comments)    Caused cold sores   Review of Systems  Unable to perform ROS: Mental status change    Physical Exam Constitutional:      Appearance: He is ill-appearing.  Cardiovascular:     Rate and Rhythm: Tachycardia present.  Pulmonary:     Effort: Pulmonary effort is normal.  Musculoskeletal:     Comments: Mineralized weakness and muscle atrophy  Skin:    General: Skin is warm and dry.  Psychiatric:        Behavior: Behavior is agitated.        Cognition and Memory: Cognition is impaired.        Judgment: Judgment is impulsive.     Vital Signs: BP 136/83 (BP Location: Right Arm)   Pulse (!) 103   Temp 98.2 F (36.8 C) (Axillary)   Resp 18   SpO2 98%  Pain Scale: 0-10   Pain Score: 0-No pain   SpO2: SpO2: 98 % O2 Device:SpO2: 98 % O2 Flow Rate: .   IO: Intake/output summary:  Intake/Output Summary (Last 24 hours) at 09/28/2023 0842 Last data filed at 09/28/2023 0981 Gross per 24 hour  Intake --  Output 1200 ml  Net -1200 ml    LBM: Last BM Date : 09/27/23 Baseline Weight:   Most recent weight:       Palliative Assessment/Data: 30 %      Time 90 minutes  Discussed with Dr Randol Kern   Signed by: Lorinda Creed, NP   Please contact Palliative Medicine Team phone at [phone] for questions and concerns.  For individual provider: See Loretha Stapler

## 2023-09-28 NOTE — Progress Notes (Signed)
Physical Therapy Treatment Patient Details Name: Philip Humphrey MRN: [id number] DOB: [birth year] Today's Date: 09/28/2023   History of Present Illness 62 y.o. male presents to [facility] hospital on 09/24/2023 after syncopal episode with chest pain. Pt found to be anemic. PMH includes: hemochromatosis, B12 deficiency, HTN, alcohol abuse, hx of leaving AMA, neuropathy, R THA.    PT Comments  Pt received in supine, lethargic and restless and with poor participation and command follow this session. Pt positioned perpendicular in bed with feet on floor and head resting on bed rail when PTA arrived to the room, pt lethargic but agreeable to repositioning for comfort/safety. Pt needing max to totalA for rolling and transition to long sitting in bed and totalA to pull up toward HOB in supine for comfort/safety. Pt reoriented to situation/location but too lethargic to respond to orientation questions other than his name today. Per chart review, pt last received Ativan 10/7 at 02:33AM, unsure if sedation is related to midodrine/other medications or altered sleep/wake cycle/CIWA (pt HR still >100 bpm resting). Pt continues to benefit from PT services to progress toward functional mobility goals, slow progress but if pt more alert next session will plan to work on transfer training.   If plan is discharge home, recommend the following: A lot of help with bathing/dressing/bathroom;Assistance with cooking/housework;Direct supervision/assist for financial management;Direct supervision/assist for medications management;Assist for transportation;Help with stairs or ramp for entrance;Supervision due to cognitive status;Two people to help with walking and/or transfers   Can travel by private vehicle     No  Equipment Recommendations  Wheelchair (measurements PT);Wheelchair cushion (measurements PT)    Recommendations for Other Services       Precautions / Restrictions Precautions Precautions: Fall Precaution  Comments: history of dizziness as well as orthostasis. Pt does not mobilize quickly enough this session for accurate assessment of orthostatic BP. Dizziness during session appears unrelated to changes in head position Restrictions Weight Bearing Restrictions: No     Mobility  Bed Mobility Overal bed mobility: Needs Assistance Bed Mobility: Rolling, Supine to Sit Rolling: Max assist, Total assist   Supine to sit: Total assist     General bed mobility comments: deferred EOB/OOB transfers as difficulty of following one step commands at this time and unable to maintain long sitting without max to totalA support. Pt assisted to semi-sidelying posture toward his R to offload bottom/ lower back    Transfers                   General transfer comment: Pt unable to safely follow commands to transfer today due to lethargy/impulsivity    Ambulation/Gait                   Stairs             Wheelchair Mobility     Tilt Bed    Modified Rankin (Stroke Patients Only)       Balance Overall balance assessment: Needs assistance Sitting-balance support: Single extremity supported, Feet unsupported Sitting balance-Leahy Scale: Zero Sitting balance - Comments: posterior lean with attempts at long sitting, pt not self-supporting well due to lethargy, HHA on LUE and bed rail support with RUE. maxA to maintain only a few second. Postural control: Posterior lean     Standing balance comment: defer, pt too lethargic to attempt                            Cognition Arousal: Lethargic,  Suspect due to medications Behavior During Therapy: Flat affect, Impulsive Overall Cognitive Status: Impaired/Different from baseline Area of Impairment: Orientation, Attention, Following commands, Safety/judgement, Awareness, Problem solving                 Orientation Level: Disoriented to, Situation Current Attention Level: Sustained Memory: Decreased recall of  precautions, Decreased short-term memory Following Commands: Follows one step commands inconsistently Safety/Judgement: Decreased awareness of safety, Decreased awareness of deficits Awareness: Emergent Problem Solving: Slow processing, Decreased initiation, Difficulty sequencing, Requires verbal cues, Requires tactile cues General Comments: Pt following <25% of simple commands, maintains eyes closed despite cues, attempted sitting but pt too lethargic to maintain upright seated posture and returns to supine impulsively. Defer OOB mobility progression given lethargy and poor command following.        Exercises      General Comments General comments (skin integrity, edema, etc.): dried smear on bed pad, pad removed and additional pad still under him. RN notified he may need bottom checked for cleanliness. Urine in purewick canister on wall appears dark reddish in color.      Pertinent Vitals/Pain Pain Assessment Pain Assessment: Faces Faces Pain Scale: Hurts little more Pain Location: grimacing, pt too lethargic to state Pain Descriptors / Indicators: Grimacing, Guarding Pain Intervention(s): Monitored during session, Limited activity within patient's tolerance, Repositioned    Home Living                          Prior Function            PT Goals (current goals can now be found in the care plan section) Acute Rehab PT Goals PT Goal Formulation: With patient Time For Goal Achievement: 10/09/23 Progress towards PT goals: Progressing toward goals    Frequency    Min 1X/week      PT Plan      Co-evaluation              AM-PAC PT "6 Clicks" Mobility   Outcome Measure  Help needed turning from your back to your side while in a flat bed without using bedrails?: A Lot Help needed moving from lying on your back to sitting on the side of a flat bed without using bedrails?: Total Help needed moving to and from a bed to a chair (including a wheelchair)?:  Total Help needed standing up from a chair using your arms (e.g., wheelchair or bedside chair)?: Total Help needed to walk in hospital room?: Total Help needed climbing 3-5 steps with a railing? : Total 6 Click Score: 7    End of Session   Activity Tolerance: Patient limited by lethargy Patient left: in bed;with call bell/phone within reach;with bed alarm set;Other (comment);with SCD's reapplied (semi-sidelying toward his R side, pillow to offload L lower back/hip; mitts donned) Nurse Communication: Mobility status;Other (comment) (IV beeping, call bell sound not working, needs soft touch call bell) PT Visit Diagnosis: Other abnormalities of gait and mobility (R26.89);Muscle weakness (generalized) (M62.81)     Time: 4098-1191 PT Time Calculation (min) (ACUTE ONLY): 16 min  Charges:    $Therapeutic Activity: 8-22 mins PT General Charges $$ ACUTE PT VISIT: 1 Visit                     Raahil Ong P., PTA Acute Rehabilitation Services Secure Chat Preferred 9a-5:30pm Office: [phone]    Dorathy Kinsman [facility] 09/28/2023, 6:43 PM

## 2023-09-28 NOTE — Progress Notes (Signed)
PROGRESS NOTE    Philip Humphrey  NUU:[id number] DOB: 1961-11-12 DOA: 09/24/2023 PCP: Courtney Paris, NP   Chief Complaint  Patient presents with   Fall    Brief Narrative:   This is a 62 year old male with past medical history of alcohol abuse, chronic hypotension, chronic thrombocytopenia, hereditary hematochromatosis, hypothyroidism, alcohol abuse.  He was brought in today after he became dizzy and passed out while in his yard.  He states he has been having increased episodes of dizziness and syncopal episode in the past 2 or 3 months.  He endorses some epigastric discomfort.  He additionally endorses chest pains which she states started on the right side and then traveled to the left that up his left jaw.  He describes the pain is a significant 8/10 but not long-lasting.  It can occur while he is at rest or with activity.  He states sometimes associated with syncope sometimes.  He describes the pain as burning/heavy.  Current pain level 0/10.  Patient has a history of alcohol abuse, drinking bourbon daily.  He states he has not drank in the last 2 days.  He denies having history of withdrawal symptoms.  Patient reports a history of gastric ulcer.   The patient was recently admitted 8/2 to 8/5 with posterior dizziness and near syncope.  At that point patient's hemoglobin was 6.6.  He received 1 unit of blood.  GI was consulted. EGD/colonoscopy recommended.  Patient refused and signed out AMA.    Today in the ER patient's hemoglobin 5.9, platelets 49, WBC 13.8, MCV 115.6, folate 4.3, iron 66 (normal).  Potassium 2.7, phosphorus 1.4, magnesium 1.2.  Troponin 115=>117.  EKG NSR without acute ischemic changes.  INR 1.3, fecal occult positive.  Alcohol level normal  Assessment & Plan:   Principal Problem:   Macrocytic anemia Active Problems:   Hypokalemia   Acute on chronic anemia   Hypothyroidism   Essential hypertension   Hereditary hemochromatosis (HCC)   Chronic idiopathic  thrombocytopenia (HCC)   Heme positive stool   Anemia due to chronic blood loss   Alcohol withdrawal delirium, acute, mixed level of activity (HCC)  Syncope and collapse -This appears to be multifactorial, the setting of soft blood pressure, multiple electrolyte abnormalities, and symptomatic anemia -Correct severe electrolyte derangement -Continue to monitor on telemetry, no significant events so far -Continue with IV fluids -Fall precautions,  consulted PT and OT  Symptomatic anemia -Patient with anemia with hemoglobin of 5.9, received 3 units on admission.hgb remains stable -Neuroplastic anemia, as well concern of chronic blood loss anemia in the setting of GI bleed with positive Hemoccult stool -Continue with IV Protonix 40 mg IV twice daily -Replace folic acid -GI consult greatly appreciated -Continue to monitor CBC and transfuse as needed. -I have discussed with the daughter today, she told me patient had endoscopy at [facility] medical couple weeks ago, she will get [place] the report, reports he was scheduled for colonoscopy last Monday which she missed as he was hospitalized..  Leukocytosis -Blood cell count significantly elevated at 24K today, he is afebrile, I will obtain septic workup including chest x-ray, urine analysis, will check procalcitonin as well, he is nontoxic appearing, well hold on initiating antibiotics.   Megaloblastic anemia Folic acid deficiency.   -Continue with folic acid supplements  Hypokalemia Hypomagnesemia Hypophosphatemia Hypocalcemia -Being replaced, continue to monitor closely   Transaminitis Coagulopathy -due to alcohol abuse,  - VIT K ordered by GI  Elevated troponin Demand ischemia from anemia.  Alcohol abuse Alcohol withdrawals with DT -CIWA protocol and scheduled Librium, he remains somnolent recently, so I will DC his scheduled Librium and hold his CIWA until he is more awake.   -Continue thiamine and folic acid   Chronic  thrombocytopenia (HCC) -Likely related to alcohol abuse and hepatic dysfunction.  No recorded liver cirrhosis.  Will order a right upper quadrant ultrasound   Chronic hypotension -Patient maintained on midodrine.  -  Patient currently hypotensive but asymptomatic.  Transfusing and reordering midodrine,   Hereditary hemochromatosis (HCC) -Per oncology   Hypothyroidism -Synthroid resumed  Left eye conjunctivitis -Continue with Cipro eyedrops      DVT prophylaxis: SCD Code Status: Full code Family Communication: Discussed with daughter by phone 09/27/2023 Disposition:   Status is: Inpatient    Consultants:  GI Palliative medicine consult requested by daughter Subjective:  Patient required extra Ativan dosing this morning secondary to withdrawals  Objective: Vitals:   09/27/23 1939 09/27/23 2346 09/28/23 0609 09/28/23 0800  BP: (!) 152/99 (!) 142/98 136/83 (!) 145/98  Pulse:  100 (!) 103 (!) 101  Resp:  18 18 15   Temp:  98 F (36.7 C) 98.2 F (36.8 C) 98.3 F (36.8 C)  TempSrc:  Axillary Axillary Oral  SpO2:  98% 98% 98%    Intake/Output Summary (Last 24 hours) at 09/28/2023 1428 Last data filed at 09/28/2023 1610 Gross per 24 hour  Intake --  Output 1200 ml  Net -1200 ml   There were no vitals filed for this visit.  Examination:   Remains somnolent this morning, no apparent distress. Symmetrical Chest wall movement, Good air movement bilaterally, CTAB RRR,No Gallops,Rubs or new Murmurs, No Parasternal Heave +ve B.Sounds, Abd Soft, No tenderness, No rebound - guarding or rigidity. No Cyanosis, Clubbing or edema, No new Rash or bruise        Data Reviewed: I have personally reviewed following labs and imaging studies  CBC: Recent Labs  Lab 09/24/23 1945 09/25/23 0946 09/26/23 0444 09/27/23 0416 09/28/23 0814  WBC 13.8* 16.2* 14.8* 17.6* 24.1*  NEUTROABS 11.0*  --   --   --   --   HGB 5.9* 10.3* 9.6* 10.3* 10.5*  HCT 18.5* 30.5* 29.0* 31.6*  31.7*  MCV 115.6* 96.5 97.3 99.7 98.8  PLT 49* 45* 54* 69* 91*    Basic Metabolic Panel: Recent Labs  Lab 09/24/23 1945 09/24/23 2110 09/25/23 0946 09/26/23 0444 09/27/23 0416 09/28/23 0320  NA 139  --  136 135 135 134*  K 2.7*  --  3.1* 3.8 3.4* 3.7  CL 96*  --  100 101 101 99  CO2 27  --  27 25 22 25   GLUCOSE 75  --  96 93 74 82  BUN 11  --  8 5* <5* <5*  CREATININE 0.95  --  0.73 0.72 0.59* 0.63  CALCIUM 8.2*  --  7.8* 7.5* 7.4* 7.7*  MG 1.2*  --  1.6* 2.0 1.6* 1.5*  PHOS  --  1.4* 1.4* 2.1* 4.2 3.2    GFR: CrCl cannot be calculated (Unknown ideal weight.).  Liver Function Tests: Recent Labs  Lab 09/24/23 1945 09/26/23 0444  AST 49* 42*  ALT 23 18  ALKPHOS 195* 180*  BILITOT 2.1* 2.5*  PROT 5.6* 4.9*  ALBUMIN 3.0* 2.6*    CBG: No results for input(s): "GLUCAP" in the last 168 hours.   Recent Results (from the past 240 hour(s))  MRSA Next Gen by PCR, Nasal     Status:  None   Collection Time: 09/25/23  2:52 AM   Specimen: Nasal Mucosa; Nasal Swab  Result Value Ref Range Status   MRSA by PCR Next Gen NOT DETECTED NOT DETECTED Final    Comment: (NOTE) The GeneXpert MRSA Assay (FDA approved for NASAL specimens only), is one component of a comprehensive MRSA colonization surveillance program. It is not intended to diagnose MRSA infection nor to guide or monitor treatment for MRSA infections. Test performance is not FDA approved in patients less than 70 years old. Performed at [facility], [address] [place], [place] [address]          Radiology Studies: No results found.      Scheduled Meds:  chlordiazePOXIDE  5 mg Oral BID   ciprofloxacin  2 drop Both Eyes Q2H while awake   folic acid  1 mg Oral Daily   levothyroxine  175 mcg Oral Q0600   midodrine  5 mg Oral TID   pantoprazole  40 mg Oral BID   sertraline  50 mg Oral QHS   thiamine  100 mg Oral Daily   Or   thiamine  100 mg Intravenous Daily   Continuous Infusions:   sodium chloride 75 mL/hr at 09/28/23 0651      LOS: 4 days        Huey Bienenstock, MD [facility]   To contact the attending provider between 7A-7P or the covering provider during after hours 7P-7A, please log into the web site [email or web address] and access using universal [place] password for that web site. If you do not have the password, please call the hospital operator.  09/28/2023, 2:28 PM

## 2023-09-29 DIAGNOSIS — F10231 Alcohol dependence with withdrawal delirium: Secondary | ICD-10-CM | POA: Diagnosis not present

## 2023-09-29 DIAGNOSIS — R195 Other fecal abnormalities: Secondary | ICD-10-CM | POA: Diagnosis not present

## 2023-09-29 DIAGNOSIS — D500 Iron deficiency anemia secondary to blood loss (chronic): Secondary | ICD-10-CM | POA: Diagnosis not present

## 2023-09-29 DIAGNOSIS — Z515 Encounter for palliative care: Secondary | ICD-10-CM | POA: Diagnosis not present

## 2023-09-29 DIAGNOSIS — D539 Nutritional anemia, unspecified: Secondary | ICD-10-CM | POA: Diagnosis not present

## 2023-09-29 DIAGNOSIS — E032 Hypothyroidism due to medicaments and other exogenous substances: Secondary | ICD-10-CM | POA: Diagnosis not present

## 2023-09-29 DIAGNOSIS — E876 Hypokalemia: Secondary | ICD-10-CM | POA: Diagnosis not present

## 2023-09-29 LAB — BASIC METABOLIC PANEL
Anion gap: 11 (ref 5–15)
BUN: 7 mg/dL — ABNORMAL LOW (ref 8–23)
CO2: 22 mmol/L (ref 22–32)
Calcium: 8 mg/dL — ABNORMAL LOW (ref 8.9–10.3)
Chloride: 101 mmol/L (ref 98–111)
Creatinine, Ser: 0.59 mg/dL — ABNORMAL LOW (ref 0.61–1.24)
GFR, Estimated: >60 mL/min (ref >60)
Glucose, Bld: 93 mg/dL (ref 70–99)
Potassium: 3.6 mmol/L (ref 3.5–5.1)
Sodium: 134 mmol/L — ABNORMAL LOW (ref 135–145)

## 2023-09-29 LAB — CBC
HCT: 32.4 % — ABNORMAL LOW (ref 39.0–52.0)
Hemoglobin: 10.8 g/dL — ABNORMAL LOW (ref 13.0–17.0)
MCH: 33 pg (ref 26.0–34.0)
MCHC: 33.3 g/dL (ref 30.0–36.0)
MCV: 99.1 fL (ref 80.0–100.0)
Platelets: 98 10*3/uL — ABNORMAL LOW (ref 150–400)
RBC: 3.27 MIL/uL — ABNORMAL LOW (ref 4.22–5.81)
RDW: 21.6 % — ABNORMAL HIGH (ref 11.5–15.5)
WBC: 26.6 10*3/uL — ABNORMAL HIGH (ref 4.0–10.5)
nRBC: 0 % (ref 0.0–0.2)

## 2023-09-29 LAB — MAGNESIUM: Magnesium: 1.4 mg/dL — ABNORMAL LOW (ref 1.7–2.4)

## 2023-09-29 LAB — PHOSPHORUS: Phosphorus: 2.4 mg/dL — ABNORMAL LOW (ref 2.5–4.6)

## 2023-09-29 MED ORDER — POTASSIUM PHOSPHATES 15 MMOLE/5ML IV SOLN
30.0000 mmol | Freq: Once | INTRAVENOUS | Status: AC
  Administered 2023-09-29: 30 mmol via INTRAVENOUS
  Filled 2023-09-29: qty 10

## 2023-09-29 MED ORDER — MAGNESIUM SULFATE 4 GM/100ML IV SOLN
4.0000 g | Freq: Once | INTRAVENOUS | Status: AC
  Administered 2023-09-29: 4 g via INTRAVENOUS
  Filled 2023-09-29: qty 100

## 2023-09-29 MED ORDER — GERHARDT'S BUTT CREAM
TOPICAL_CREAM | Freq: Four times a day (QID) | CUTANEOUS | Status: DC
  Administered 2023-10-01 – 2023-10-03 (×4): 1 via TOPICAL
  Filled 2023-09-29: qty 1

## 2023-09-29 MED ORDER — POTASSIUM CHLORIDE CRYS ER 20 MEQ PO TBCR
20.0000 meq | EXTENDED_RELEASE_TABLET | Freq: Once | ORAL | Status: AC
  Administered 2023-09-29: 20 meq via ORAL
  Filled 2023-09-29: qty 1

## 2023-09-29 NOTE — Plan of Care (Signed)

## 2023-09-29 NOTE — Progress Notes (Signed)
Labs and notes reviewed.  Communicated with Dr. Jerral Ralph of [facility].  Sounds like mental status slowly clearing.  Regarding his multifactorial anemia, it would be appropriate for this patient to have his colonoscopy as an outpatient after full recovery from this acute illness.   He recently had an EGD with [facility] medical clinic according to family report, and he should return there within a few weeks after hospital discharge to coordinate that care.  Inpatient GI service signing off - call if urgent issues arise.  -  - Amada Jupiter, MD    [facility]

## 2023-09-29 NOTE — Progress Notes (Signed)
Occupational Therapy Treatment Patient Details Name: Philip Humphrey MRN: [id number] DOB: 05-20-1961 Today's Date: 09/29/2023   History of present illness 62 y.o. male presents to [facility] hospital on 09/24/2023 after syncopal episode with chest pain. Pt found to be anemic. PMH includes: hemochromatosis, B12 deficiency, HTN, alcohol abuse, hx of leaving AMA, neuropathy, R THA.   OT comments  Pt at this time was in bed and took increase in time to arousal but once they keep their eyes open they could follow one step commands. Pt took three trials to get to EOB with Max x2 assist with max cues to remain awake in session. Pt then complete sit to stand transfer from elevated surface with max x2 with total assist for hygiene. Pt then completed a step pivot to chair with max x2. Mr. Grandstaff then was able to eat an entire container of applesauce with music playing and all lights on to increase in stimulus to decrease in falling asleep while eating. Patient will benefit from continued inpatient follow up therapy, <3 hours/day      If plan is discharge home, recommend the following:  Two people to help with walking and/or transfers;Two people to help with bathing/dressing/bathroom;Assistance with cooking/housework;Direct supervision/assist for medications management;Direct supervision/assist for financial management;Assist for transportation   Equipment Recommendations  None recommended by OT (TBD)    Recommendations for Other Services      Precautions / Restrictions Precautions Precautions: Fall Precaution Comments: hx of dizziness, lethargic Restrictions Weight Bearing Restrictions: No       Mobility Bed Mobility Overal bed mobility: Needs Assistance Bed Mobility: Supine to Sit Rolling: Max assist, +2 for physical assistance, +2 for safety/equipment              Transfers Overall transfer level: Needs assistance Equipment used: Rolling walker (2 wheels) Transfers: Sit to/from  Stand Sit to Stand: Max assist, +2 physical assistance, +2 safety/equipment, From elevated surface                 Balance Overall balance assessment: Needs assistance Sitting-balance support: Feet supported, Bilateral upper extremity supported Sitting balance-Leahy Scale: Poor Sitting balance - Comments: heavy posterior lean and attempted to lay back into bed once and then started to craw to the base of the bed to weight shoft off of bottom Postural control: Posterior lean, Right lateral lean, Left lateral lean Standing balance support: Bilateral upper extremity supported Standing balance-Leahy Scale: Poor                             ADL either performed or assessed with clinical judgement   ADL Overall ADL's : Needs assistance/impaired Eating/Feeding: Minimal assistance;Sitting Eating/Feeding Details (indicate cue type and reason): set up to min assist due to level of arrousal but then was able to eat an entire applesauce Grooming: Wash/dry hands;Wash/dry face;Maximal assistance;Sitting   Upper Body Bathing: Maximal assistance;Sitting   Lower Body Bathing: Total assistance;+2 for physical assistance;+2 for safety/equipment;Sit to/from stand   Upper Body Dressing : Moderate assistance;Sitting   Lower Body Dressing: Maximal assistance;+2 for physical assistance;+2 for safety/equipment;Sit to/from stand   Toilet Transfer: Maximal assistance;+2 for physical assistance;+2 for safety/equipment;Rolling walker (2 wheels);BSC/3in1   Toileting- Clothing Manipulation and Hygiene: Total assistance;+2 for physical assistance;+2 for safety/equipment;Sit to/from stand       Functional mobility during ADLs: Maximal assistance;+2 for physical assistance;+2 for safety/equipment;Rolling walker (2 wheels)      Extremity/Trunk Assessment Upper Extremity Assessment Upper Extremity Assessment:  Generalized weakness;Difficult to assess due to impaired cognition             Vision       Perception     Praxis      Cognition Arousal: Lethargic, Suspect due to medications Behavior During Therapy: Flat affect Overall Cognitive Status: Impaired/Different from baseline Area of Impairment: Orientation, Attention, Memory, Following commands, Safety/judgement, Awareness, Problem solving                 Orientation Level: Disoriented to, Time, Situation Current Attention Level: Sustained Memory: Decreased recall of precautions, Decreased short-term memory Following Commands: Follows one step commands inconsistently Safety/Judgement: Decreased awareness of safety, Decreased awareness of deficits Awareness: Emergent Problem Solving: Slow processing, Decreased initiation, Difficulty sequencing, Requires verbal cues, Requires tactile cues General Comments: If the pt stayed awake they started to follow commands but as soon as they shut eyes will start to lay back down into bed and have to give them cues again.        Exercises      Shoulder Instructions       General Comments      Pertinent Vitals/ Pain       Pain Assessment Pain Assessment: Faces Faces Pain Scale: Hurts a little bit Pain Location: bottom Pain Descriptors / Indicators: Aching Pain Intervention(s): Limited activity within patient's tolerance, Monitored during session  Home Living                                          Prior Functioning/Environment              Frequency  Min 1X/week        Progress Toward Goals  OT Goals(current goals can now be found in the care plan section)  Progress towards OT goals: Progressing toward goals  Acute Rehab OT Goals Patient Stated Goal: to eat some OT Goal Formulation: With patient Time For Goal Achievement: 10/10/23 Potential to Achieve Goals: Good ADL Goals Pt Will Perform Upper Body Bathing: with modified independence;sitting Pt Will Perform Lower Body Bathing: with supervision;sitting/lateral  leans;sit to/from stand Pt Will Transfer to Toilet: with supervision;ambulating Pt Will Perform Tub/Shower Transfer: with contact guard assist;ambulating Additional ADL Goal #1: Pt will be able to complete stadning ADLS while standing with no LOB  Plan      Co-evaluation                 AM-PAC OT "6 Clicks" Daily Activity     Outcome Measure   Help from another person eating meals?: A Lot Help from another person taking care of personal grooming?: A Lot Help from another person toileting, which includes using toliet, bedpan, or urinal?: Total Help from another person bathing (including washing, rinsing, drying)?: A Lot Help from another person to put on and taking off regular upper body clothing?: A Lot Help from another person to put on and taking off regular lower body clothing?: A Lot 6 Click Score: 11    End of Session Equipment Utilized During Treatment: Gait belt;Rolling walker (2 wheels)  OT Visit Diagnosis: Unsteadiness on feet (R26.81);Other abnormalities of gait and mobility (R26.89);Muscle weakness (generalized) (M62.81);History of falling (Z91.81)   Activity Tolerance Patient limited by lethargy;Patient limited by pain   Patient Left in bed;with call bell/phone within reach;with bed alarm set   Nurse Communication Mobility status  Time: 1200-1252 OT Time Calculation (min): 52 min  Charges: OT General Charges $OT Visit: 1 Visit OT Treatments $Self Care/Home Management : 38-52 mins  Presley Raddle OTR/L  Acute Rehab Services  [phone] office number   Alphia Moh 09/29/2023, 1:04 PM

## 2023-09-29 NOTE — Progress Notes (Addendum)
PROGRESS NOTE        PATIENT DETAILS Name: Philip Humphrey Age: 62 y.o. Sex: male Date of Birth: Jul 18, 1961 Admit Date: 09/24/2023 Admitting Physician Gery Pray, MD MVH:QIONG, Fleet Contras, NP  Brief Summary: Patient is a 62 y.o.  male with history of EtOH use, hemochromatosis, hypothyroidism-who presented with dizziness/syncopal episode while out in his yard.  Per H&P-patient has had numerous episodes in the past 2-3 months.  He was found to have a hemoglobin of 5.9-and numerous electrolyte imbalances-and subsequently admitted to the hospitalist service.  Hospital course complicated by alcohol withdrawal and early delirium tremens.  Significant events: 8/2-8/5>> hospitalized for anemia/electrolyte abnormalities-left AMA before GI workup could be completed. 10/4>> admit to [facility]-dizziness/syncope-worsening anemia-electrolyte derangements.  Significant studies: 8/03>> echo: EF 65-70%. 10/4>> CXR: No acute findings 10/4>> CT head: No acute intracranial abnormality 10/5>> x-ray right ankle: No fracture 10/5>> x-ray left ankle: No fracture 10/5>> x-ray right knee: No fracture 10/5>> x-ray left knee: No fracture 10/5>> RUQ ultrasound: No acute findings 10/7>> CXR: No PNA  Significant microbiology data: None   Procedures: None none  Consults: GI  Subjective: Lying in dried dark-colored stools-Sleepy but relatively awake/alert-answers questions appropriately.  Objective: Vitals: Blood pressure (!) 123/90, pulse (!) 108, temperature 98.7 F (37.1 C), temperature source Oral, resp. rate 20, SpO2 100%.   Exam: Gen Exam:not in any distress HEENT:atraumatic, normocephalic Chest: B/L clear to auscultation anteriorly CVS:S1S2 regular Abdomen:soft non tender, non distended Extremities:no edema Neurology: Non focal Skin: no rash  Pertinent Labs/Radiology:    Latest Ref Rng & Units 09/29/2023    7:52 AM 09/28/2023    8:14 AM 09/27/2023    4:16 AM  CBC   WBC 4.0 - 10.5 K/uL 26.6  24.1  17.6   Hemoglobin 13.0 - 17.0 g/dL 29.5  28.4  13.2   Hematocrit 39.0 - 52.0 % 32.4  31.7  31.6   Platelets 150 - 400 K/uL 98  91  69     Lab Results  Component Value Date   NA 134 (L) 09/29/2023   K 3.6 09/29/2023   CL 101 09/29/2023   CO2 22 09/29/2023      Assessment/Plan: Dizziness/syncopal episodes Probably multifactorial in the setting of EtOH use/severe anemia/electrolyte abnormalities/dehydration Recent echo stable Telemetry stable PT/OT  Normocytic anemia Felt to be a combination of chronic blood loss/slow GI bleed-vitamin B12/folate deficiency Required 3 units of PRBC transfusion-last transfused on 10/6 Hb stable-follow  Probable upper GI bleeding Some dark-colored stools overnight However Hb stable PPI Per prior notes-patient recently had a endoscopy at [facility] Family working on getting [place] report. GI following-with tentative plans to perform endoscopic evaluation based on clinical course/recent EGD report.  Hypokalemia Hypophosphatemia Hypomagnesemia Secondary to alcohol use Continue to replete and recheck in AM.  Alcohol withdrawal with early DTs Improved mentation today-relatively awake and alert No longer on benzodiazepines Should be in the latter stages of alcohol withdrawal window Continue thiamine  Leukocytosis Afebrile UA/CXR negative At risk for aspiration given alcohol withdrawals No source of infection apparent on physical exam Monitor off antibiotics-if febrile-or if worsens-start empiric Unasyn-obtain cultures  Thrombocytopenia Likely due to EtOH use Platelet counts continue to slowly improve.  Heritage hemochromatosis Patient follow-up with oncology Needs to abstain from alcohol use  Chronic hypotension BP stable on midodrine  B12/folic acid deficiency  B12 level on 10/4-stable On folic  acid supplementation  Hypothyroidism Synthroid TSH minimally elevated on 8/2 Repeat TSH  in 6 weeks  Left eye conjunctivitis Ciprofloxacin eyedrops  Debility/deconditioning Secondary to acute illness PT/OT eval-SNF recommended  BMI: Estimated body mass index is 26.79 kg/m as calculated from the following:   Height as of 07/23/23: 5' 9.5" (1.765 m).   Weight as of 07/25/23: 83.5 kg.   Code status:   Code Status: Full Code   DVT Prophylaxis: SCDs   Family Communication: Daughter-[name]-[phone] updated 10/9   Disposition Plan: Status is: Inpatient Remains inpatient appropriate because: Severity  of illness   Planned Discharge Destination:Skilled nursing facility   Diet: Diet Order             Diet full liquid Room service appropriate? Yes; Fluid consistency: Thin  Diet effective now                     Antimicrobial agents: Anti-infectives (From admission, onward)    None        MEDICATIONS: Scheduled Meds:  ciprofloxacin  2 drop Both Eyes Q2H while awake   folic acid  1 mg Oral Daily   levothyroxine  175 mcg Oral Q0600   midodrine  5 mg Oral TID   pantoprazole  40 mg Oral BID   sertraline  50 mg Oral QHS   thiamine  100 mg Oral Daily   Or   thiamine  100 mg Intravenous Daily   Continuous Infusions:  sodium chloride 75 mL/hr at 09/29/23 0332   PRN Meds:.acetaminophen **OR** acetaminophen, haloperidol lactate, senna-docusate   I have personally reviewed following labs and imaging studies  LABORATORY DATA: CBC: Recent Labs  Lab 09/24/23 1945 09/25/23 0946 09/26/23 0444 09/27/23 0416 09/28/23 0814 09/29/23 0752  WBC 13.8* 16.2* 14.8* 17.6* 24.1* 26.6*  NEUTROABS 11.0*  --   --   --   --   --   HGB 5.9* 10.3* 9.6* 10.3* 10.5* 10.8*  HCT 18.5* 30.5* 29.0* 31.6* 31.7* 32.4*  MCV 115.6* 96.5 97.3 99.7 98.8 99.1  PLT 49* 45* 54* 69* 91* 98*    Basic Metabolic Panel: Recent Labs  Lab 09/25/23 0946 09/26/23 0444 09/27/23 0416 09/28/23 0320 09/29/23 0752  NA 136 135 135 134* 134*  K 3.1* 3.8 3.4* 3.7 3.6  CL 100 101  101 99 101  CO2 27 25 22 25 22   GLUCOSE 96 93 74 82 93  BUN 8 5* <5* <5* 7*  CREATININE 0.73 0.72 0.59* 0.63 0.59*  CALCIUM 7.8* 7.5* 7.4* 7.7* 8.0*  MG 1.6* 2.0 1.6* 1.5* 1.4*  PHOS 1.4* 2.1* 4.2 3.2 2.4*    GFR: CrCl cannot be calculated (Unknown ideal weight.).  Liver Function Tests: Recent Labs  Lab 09/24/23 1945 09/26/23 0444  AST 49* 42*  ALT 23 18  ALKPHOS 195* 180*  BILITOT 2.1* 2.5*  PROT 5.6* 4.9*  ALBUMIN 3.0* 2.6*   No results for input(s): "LIPASE", "AMYLASE" in the last 168 hours. No results for input(s): "AMMONIA" in the last 168 hours.  Coagulation Profile: Recent Labs  Lab 09/24/23 1943 09/26/23 0444  INR 1.3* 1.2    Cardiac Enzymes: No results for input(s): "CKTOTAL", "CKMB", "CKMBINDEX", "TROPONINI" in the last 168 hours.  BNP (last 3 results) No results for input(s): "PROBNP" in the last 8760 hours.  Lipid Profile: No results for input(s): "CHOL", "HDL", "LDLCALC", "TRIG", "CHOLHDL", "LDLDIRECT" in the last 72 hours.  Thyroid Function Tests: No results for input(s): "TSH", "T4TOTAL", "FREET4", "T3FREE", "THYROIDAB" in the  last 72 hours.  Anemia Panel: No results for input(s): "VITAMINB12", "FOLATE", "FERRITIN", "TIBC", "IRON", "RETICCTPCT" in the last 72 hours.  Urine analysis:    Component Value Date/Time   COLORURINE AMBER (A) 09/27/2023 0107   APPEARANCEUR CLEAR 09/27/2023 0107   LABSPEC 1.012 09/27/2023 0107   PHURINE 7.0 09/27/2023 0107   GLUCOSEU NEGATIVE 09/27/2023 0107   HGBUR NEGATIVE 09/27/2023 0107   BILIRUBINUR SMALL (A) 09/27/2023 0107   KETONESUR NEGATIVE 09/27/2023 0107   PROTEINUR NEGATIVE 09/27/2023 0107   NITRITE POSITIVE (A) 09/27/2023 0107   LEUKOCYTESUR NEGATIVE 09/27/2023 0107    Sepsis Labs: Lactic Acid, Venous    Component Value Date/Time   LATICACIDVEN 1.2 07/24/2023 0458    MICROBIOLOGY: Recent Results (from the past 240 hour(s))  MRSA Next Gen by PCR, Nasal     Status: None   Collection Time:  09/25/23  2:52 AM   Specimen: Nasal Mucosa; Nasal Swab  Result Value Ref Range Status   MRSA by PCR Next Gen NOT DETECTED NOT DETECTED Final    Comment: (NOTE) The GeneXpert MRSA Assay (FDA approved for NASAL specimens only), is one component of a comprehensive MRSA colonization surveillance program. It is not intended to diagnose MRSA infection nor to guide or monitor treatment for MRSA infections. Test performance is not FDA approved in patients less than 53 years old. Performed at [facility], [address], [place], [place] [address]     RADIOLOGY STUDIES/RESULTS: DG Chest Port 1 View  Result Date: 09/28/2023 CLINICAL DATA:  Leukocytosis EXAM: PORTABLE CHEST 1 VIEW COMPARISON:  09/24/2023 FINDINGS: Low lung volumes with minimal basilar atelectasis. No consolidation or effusion. Normal cardiac size. No pneumothorax IMPRESSION: Low lung volumes with minimal basilar atelectasis. Electronically Signed   By: Jasmine Pang M.D.   On: 09/28/2023 16:36     LOS: 5 days   Jeoffrey Massed, MD  [facility]    To contact the attending provider between 7A-7P or the covering provider during after hours 7P-7A, please log into the web site [email or web address] and access using universal [place] password for that web site. If you do not have the password, please call the hospital operator.  09/29/2023, 9:35 AM

## 2023-09-29 NOTE — Progress Notes (Signed)
Mobility Specialist Progress Note;   09/29/23 1440  Mobility  Activity Transferred from chair to bed  Level of Assistance Minimal assist, patient does 75% or more (+2)  Assistive Device Front wheel walker  Activity Response Tolerated well  Mobility Referral Yes  $Mobility charge 1 Mobility  Mobility Specialist Start Time (ACUTE ONLY) 1440  Mobility Specialist Stop Time (ACUTE ONLY) 1455  Mobility Specialist Time Calculation (min) (ACUTE ONLY) 15 min   RN requested assistance with transfer C>B. Required MinA +2 to safely transfer. Pt asx throughout transfer and left in bed with all needs met. Bed alarm on.   Caesar Bookman Mobility Specialist Please contact via SecureChat or Rehab Office [phone]

## 2023-09-29 NOTE — Progress Notes (Signed)
Patient ID: Philip Humphrey, male   DOB: [birth year], 62 y.o.   MRN: [id number]    Progress Note from the Palliative Medicine Team at [facility]   Patient Name: Philip Humphrey        Date: 09/29/2023 DOB: [birth year]  Age: 62 y.o. MRN#: [id number] Attending Physician: Maretta Bees, MD Primary Care Physician: Courtney Paris, NP Admit Date: 09/24/2023   Reason for Consultation/Follow-up   Establishing Goals of Care   HPI/ Brief Hospital Review  Establishing goals of care, psychosocial support   Subjective  Extensive chart review has been completed prior to meeting with patient/family  including labs, vital signs, imaging, progress/consult notes, orders, medications and available advance directive documents.    This NP assessed patient at the bedside as a follow up to  yesterday's GOCs meeting.  Patient remains confused, bilateral needs in place.    62 y.o. male   admitted on 09/24/2023 with   past medical history of alcohol abuse, chronic hypotension, chronic thrombocytopenia, hereditary hematochromatosis, hypothyroidism, alcohol abuse.     He was brought in today after he became dizzy and passed out while in his yard. Reported increased episodes of dizziness and syncopal episode in the past 2 or 3 months.     The patient was recently admitted 8/2 to 8/5 with posterior dizziness and near syncope.  At that point patient's hemoglobin was 6.6.  He received 1 unit of blood.  GI was consulted. EGD/colonoscopy recommended.  Patient refused and signed out AMA.    Patient has significant history of alcohol abuse.     Patient and family face treatment option decisions, advanced directive decisions and anticipatory care needs    Education offered today regarding  the importance of continued conversation with family and their  medical providers regarding overall plan of care and treatment options,  ensuring decisions are within the context of the patients values and  GOCs.  Questions and concerns addressed   Discussed with primary team and nursing staff   Time: 50   minutes  Detailed review of medical records ( labs, imaging, vital signs), medically appropriate exam ( MS, skin, cardiac,  resp)   discussed with treatment team, counseling and education to patient, family, staff, documenting clinical information, medication management, coordination of care    Lorinda Creed NP  Palliative Medicine Team Team Phone # [phone] Pager [phone]

## 2023-09-29 NOTE — TOC Progression Note (Signed)
Transition of Care [facility]) - Progression Note    Patient Details  Name: Philip Humphrey MRN: [id number] Date of Birth: 1961-01-25  [facility] of [facility]) CM/SW Contact  Mearl Latin, LCSW Phone Number: 09/29/2023, 2:36 PM  Clinical Narrative:    CSW continuing to follow for medical stability.    Expected Discharge Plan: Skilled Nursing Facility Barriers to Discharge: Continued Medical Work up (Agitated; mitts)  Expected Discharge Plan and Services In-house Referral: Clinical Social Work     Living arrangements for the past 2 months: Single Family Home                                       Social Determinants of Health (SDOH) Interventions SDOH Screenings   Food Insecurity: No Food Insecurity (09/24/2023)  Housing: Low Risk  (09/24/2023)  Transportation Needs: No Transportation Needs (09/24/2023)  Utilities: Not At Risk (09/24/2023)  Depression (PHQ2-9): Low Risk  (09/15/2022)  Tobacco Use: Medium Risk (09/24/2023)    Readmission Risk Interventions    07/26/2023   12:50 PM  Readmission Risk Prevention Plan  Transportation Screening Complete  PCP or Specialist Appt within 5-7 Days Complete  Home Care Screening Complete  Medication Review (RN CM) Complete

## 2023-09-30 DIAGNOSIS — E876 Hypokalemia: Secondary | ICD-10-CM | POA: Diagnosis not present

## 2023-09-30 DIAGNOSIS — D649 Anemia, unspecified: Secondary | ICD-10-CM | POA: Diagnosis not present

## 2023-09-30 DIAGNOSIS — F10231 Alcohol dependence with withdrawal delirium: Secondary | ICD-10-CM | POA: Diagnosis not present

## 2023-09-30 DIAGNOSIS — E032 Hypothyroidism due to medicaments and other exogenous substances: Secondary | ICD-10-CM | POA: Diagnosis not present

## 2023-09-30 LAB — BASIC METABOLIC PANEL
Anion gap: 9 (ref 5–15)
BUN: 5 mg/dL — ABNORMAL LOW (ref 8–23)
CO2: 24 mmol/L (ref 22–32)
Calcium: 8.1 mg/dL — ABNORMAL LOW (ref 8.9–10.3)
Chloride: 100 mmol/L (ref 98–111)
Creatinine, Ser: 0.63 mg/dL (ref 0.61–1.24)
GFR, Estimated: >60 mL/min (ref >60)
Glucose, Bld: 88 mg/dL (ref 70–99)
Potassium: 4 mmol/L (ref 3.5–5.1)
Sodium: 133 mmol/L — ABNORMAL LOW (ref 135–145)

## 2023-09-30 LAB — CBC
HCT: 30.4 % — ABNORMAL LOW (ref 39.0–52.0)
Hemoglobin: 9.9 g/dL — ABNORMAL LOW (ref 13.0–17.0)
MCH: 32.7 pg (ref 26.0–34.0)
MCHC: 32.6 g/dL (ref 30.0–36.0)
MCV: 100.3 fL — ABNORMAL HIGH (ref 80.0–100.0)
Platelets: 115 10*3/uL — ABNORMAL LOW (ref 150–400)
RBC: 3.03 MIL/uL — ABNORMAL LOW (ref 4.22–5.81)
RDW: 21.6 % — ABNORMAL HIGH (ref 11.5–15.5)
WBC: 18 10*3/uL — ABNORMAL HIGH (ref 4.0–10.5)
nRBC: 0 % (ref 0.0–0.2)

## 2023-09-30 LAB — PHOSPHORUS: Phosphorus: 3 mg/dL (ref 2.5–4.6)

## 2023-09-30 LAB — MAGNESIUM: Magnesium: 1.9 mg/dL (ref 1.7–2.4)

## 2023-09-30 MED ORDER — MAGNESIUM SULFATE IN D5W 1-5 GM/100ML-% IV SOLN
1.0000 g | Freq: Once | INTRAVENOUS | Status: AC
  Administered 2023-09-30: 1 g via INTRAVENOUS
  Filled 2023-09-30: qty 100

## 2023-09-30 NOTE — TOC Progression Note (Signed)
Transition of Care [facility]) - Progression Note    Patient Details  Name: Philip Humphrey MRN: [id number] Date of Birth: 1961-08-30  [facility]) CM/SW Contact  Marliss Coots, LCSW Phone Number: 09/30/2023, 12:41 PM  Clinical Narrative:     This CSW called patient's daughter/POA Celedonio Miyamoto at [phone] to discuss SNF placement upon discharge. There was no answer and a voicemail was left requesting a call back.  Expected Discharge Plan: Skilled Nursing Facility Barriers to Discharge: SNF Pending bed offer, Continued Medical Work up  Expected Discharge Plan and Services In-house Referral: Clinical Social Work   Post Acute Care Choice: Skilled Nursing Facility Living arrangements for the past 2 months: Single Family Home                   DME Agency: NA                   Social Determinants of Health (SDOH) Interventions SDOH Screenings   Food Insecurity: No Food Insecurity (09/24/2023)  Housing: Low Risk  (09/24/2023)  Transportation Needs: No Transportation Needs (09/24/2023)  Utilities: Not At Risk (09/24/2023)  Depression (PHQ2-9): Low Risk  (09/15/2022)  Tobacco Use: Medium Risk (09/24/2023)    Readmission Risk Interventions    07/26/2023   12:50 PM  Readmission Risk Prevention Plan  Transportation Screening Complete  PCP or Specialist Appt within 5-7 Days Complete  Home Care Screening Complete  Medication Review (RN CM) Complete

## 2023-09-30 NOTE — Progress Notes (Signed)
Mobility Specialist Progress Note;   09/30/23 1105  Mobility  Activity Transferred from bed to chair  Level of Assistance Minimal assist, patient does 75% or more (+2)  Assistive Device Other (Comment) (IV pole)  Distance Ambulated (ft) 3 ft  Activity Response Tolerated well  Mobility Referral Yes  $Mobility charge 1 Mobility  Mobility Specialist Start Time (ACUTE ONLY) 1105  Mobility Specialist Stop Time (ACUTE ONLY) 1115  Mobility Specialist Time Calculation (min) (ACUTE ONLY) 10 min   Pt agreeable to mobility, received soiled in bed. Stood with minA, second person assisted w/ pericare. Pivoted to chair with minA +IV pole. Pt left in chair with all needs met. Alarm on.   Philip Humphrey Mobility Specialist Please contact via SecureChat or Rehab Office [phone]

## 2023-09-30 NOTE — Progress Notes (Signed)
Physical Therapy Treatment Patient Details Name: Philip Humphrey MRN: [id number] DOB: October 26, 1961 Today's Date: 09/30/2023   History of Present Illness 62 y.o. male presents to [facility] hospital on 09/24/2023 after syncopal episode with chest pain. Pt found to be anemic. PMH includes: hemochromatosis, B12 deficiency, HTN, alcohol abuse, hx of leaving AMA, neuropathy, R THA.    PT Comments  Slow progress towards goals. Pt was able to perform sit to stand and transfer at Max A this session from Max A +2 with RW. Pt requires encouragement to participate and has difficulty following direction to improve body mechanics in order to decrease level of physical assist. Pt is at a high risk for falls. Due to pt current functional status, home set up and available assistance at home recommending skilled physical therapy services < 3 hours/day on discharge from acute care hospital setting in order to decrease risk for falls, immobility, skin break down, injury and re-hospitalization. Pt reports he was limited by fatigue today.     If plan is discharge home, recommend the following: Assistance with cooking/housework;Assist for transportation;Help with stairs or ramp for entrance;Supervision due to cognitive status;A lot of help with walking and/or transfers   Can travel by private vehicle     No  Equipment Recommendations  Wheelchair (measurements PT);Wheelchair cushion (measurements PT)       Precautions / Restrictions Precautions Precautions: Fall Restrictions Weight Bearing Restrictions: No     Mobility  Bed Mobility Overal bed mobility: Needs Assistance Bed Mobility: Sit to Supine       Sit to supine: Min assist   General bed mobility comments: Min A with LE for sitting to supine and once in bed Min A to scoot up in the bed.    Transfers Overall transfer level: Needs assistance Equipment used: Rolling walker (2 wheels) Transfers: Sit to/from Stand, Bed to chair/wheelchair/BSC Sit to  Stand: Max assist   Step pivot transfers: Min assist       General transfer comment: Max A for sit to stand from recliner. Initial attempt unsuccessful due to poor body mechanics and weakness, second attempt Max A with heavy multi modal cueing throughout.    Ambulation/Gait Ambulation/Gait assistance:  (pt refused ambulation stating he was too fatigued from sitting up in the chair and started to lay down in the bed.)           Pre-gait activities: Pt took steps from recliner to EOB. flexed knees and hips with very short small steps. Low foot clearance without being a shuffle.        Balance Overall balance assessment: Needs assistance Sitting-balance support: Feet supported, Bilateral upper extremity supported Sitting balance-Leahy Scale: Fair Sitting balance - Comments: Sitting edge of recliner without significant deficits   Standing balance support: Bilateral upper extremity supported, Reliant on assistive device for balance Standing balance-Leahy Scale: Poor Standing balance comment: Pt requires external assist with AD to remain upright       Cognition Arousal: Alert Behavior During Therapy: Flat affect Overall Cognitive Status: Impaired/Different from baseline Area of Impairment: Following commands, Safety/judgement           Orientation Level: Disoriented to, Time, Situation   Memory: Decreased recall of precautions, Decreased short-term memory Following Commands: Follows one step commands inconsistently Safety/Judgement: Decreased awareness of safety, Decreased awareness of deficits                 General Comments General comments (skin integrity, edema, etc.): Dried smear on bed pad in recliner. Removed  bed pad and RN notified of pt status. Urine continues at a reddish color.      Pertinent Vitals/Pain Pain Assessment Faces Pain Scale: Hurts a little bit Pain Location: bottom Pain Descriptors / Indicators: Aching Pain Intervention(s): Monitored  during session, Limited activity within patient's tolerance     PT Goals (current goals can now be found in the care plan section) Acute Rehab PT Goals Patient Stated Goal: to stop falling PT Goal Formulation: With patient Time For Goal Achievement: 10/09/23 Potential to Achieve Goals: Fair Progress towards PT goals: Progressing toward goals    Frequency    Min 1X/week      PT Plan  Continue with current POC       AM-PAC PT "6 Clicks" Mobility   Outcome Measure  Help needed turning from your back to your side while in a flat bed without using bedrails?: A Lot Help needed moving from lying on your back to sitting on the side of a flat bed without using bedrails?: A Lot Help needed moving to and from a bed to a chair (including a wheelchair)?: A Lot Help needed standing up from a chair using your arms (e.g., wheelchair or bedside chair)?: A Lot Help needed to walk in hospital room?: Total Help needed climbing 3-5 steps with a railing? : Total 6 Click Score: 10    End of Session Equipment Utilized During Treatment: Gait belt Activity Tolerance: Other (comment);Patient limited by fatigue (pt slightly self limiting) Patient left: in bed;with call bell/phone within reach;with bed alarm set Nurse Communication: Mobility status PT Visit Diagnosis: Other abnormalities of gait and mobility (R26.89);Muscle weakness (generalized) (M62.81)     Time: 1610-9604 PT Time Calculation (min) (ACUTE ONLY): 11 min  Charges:    $Therapeutic Activity: 8-22 mins PT General Charges $$ ACUTE PT VISIT: 1 Visit                     Harrel Carina, DPT, CLT  Acute Rehabilitation Services Office: [phone] (Secure chat preferred)    Claudia Desanctis 09/30/2023, 3:37 PM

## 2023-09-30 NOTE — [place] FL2 (Signed)
[place] MEDICAID FL2 LEVEL OF CARE FORM     IDENTIFICATION  Patient Name: Philip Humphrey Birthdate: 1961/12/01 Sex: male Admission Date (Current Location): 09/24/2023  [facility] and [place] Number:  Producer, television/film/video and Address:  [facility] [place], [address] [place], [place] [address]      Provider Number: [id number]  Attending Physician Name and Address:  Maretta Bees, MD  Relative Name and Phone Number:  Celedonio Miyamoto [facility]); [phone]    Current Level of Care: Hospital Recommended Level of Care: Skilled Nursing Facility Prior Approval Number:    Date Approved/Denied:   PASRR Number: [id number]  Discharge Plan: SNF    Current Diagnoses: Patient Active Problem List   Diagnosis Date Noted   Anemia due to chronic blood loss 09/27/2023   Alcohol withdrawal delirium, acute, mixed level of activity (HCC) 09/27/2023   Heme positive stool 09/26/2023   Macrocytic anemia 09/24/2023   Postural dizziness 07/24/2023   Dizziness 07/23/2023   Leukocytosis 07/23/2023   Acute on chronic anemia 07/23/2023   Chronic idiopathic thrombocytopenia (HCC) 07/23/2023   Hepatic steatosis 07/23/2023   Vitamin B12 deficiency 07/23/2023   GERD (gastroesophageal reflux disease) 07/23/2023   Hypotensive episode 07/23/2023   Postural dizziness with near syncope 07/23/2023   Hypophosphatemia 07/23/2023   Hypomagnesemia 07/23/2023   Syncope 08/10/2022   Orthostatic hypotension 08/10/2022   Diarrhea 08/10/2022   Chronic alcohol use 08/10/2022   Hyperlipidemia 08/10/2022   Peripheral neuropathy 08/10/2022   Fall 04/12/2022   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   AKI (acute kidney injury) (HCC) 03/03/2022   Hyponatremia 03/03/2022   Hypokalemia 03/03/2022   Acute metabolic acidosis 03/03/2022   Elevated LFTs 03/03/2022   Macrocytosis 03/03/2022   Thrombocytopenia (HCC) 03/03/2022   Hereditary hemochromatosis (HCC) 07/17/2021   Alcohol abuse  07/07/2021   Pancreatitis 07/07/2021   Acute kidney injury (HCC) 07/07/2021   Acute pancreatitis 07/07/2021   Status post total hip replacement, 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   Obese 11/25/2016   S/P left THA, AA 11/24/2016   Hypothyroidism 04/12/2012   Essential hypertension 04/12/2012   History of gastritis 04/12/2012   History of tobacco use 04/12/2012   Overweight 04/12/2012    Orientation RESPIRATION BLADDER Height & Weight     Self, Situation, Place  Normal (Room Air) Incontinent, External catheter Weight:   Height:     BEHAVIORAL SYMPTOMS/MOOD NEUROLOGICAL BOWEL NUTRITION STATUS      Incontinent Diet (See dc summary)  AMBULATORY STATUS COMMUNICATION OF NEEDS Skin   Limited Assist Verbally Normal                       Personal Care Assistance Level of Assistance  Bathing, Feeding, Dressing Bathing Assistance: Limited assistance Feeding assistance: Limited assistance Dressing Assistance: Limited assistance     Functional Limitations Info  Sight Sight Info: Impaired (Eyeglasses)        SPECIAL CARE FACTORS FREQUENCY  PT (By licensed PT), OT (By licensed OT)     PT Frequency: 5x OT Frequency: 5x            Contractures Contractures Info: Not present    Additional Factors Info  Code Status, Allergies Code Status Info: Full Code Allergies Info: Sulfa Antibiotics, Oxycodone, Nickel           Current Medications (09/30/2023):  This is the current hospital active medication list Current Facility-Administered Medications  Medication Dose Route Frequency Provider Last Rate Last Admin   acetaminophen (TYLENOL)  tablet 650 mg  650 mg Oral Q6H PRN Gery Pray, MD       Or   acetaminophen (TYLENOL) suppository 650 mg  650 mg Rectal Q6H PRN Crosley, Debby, MD       ciprofloxacin (CILOXAN) 0.3 % ophthalmic solution 2 drop  2 drop Both Eyes Q2H while awake Elgergawy, Leana Roe, MD   2 drop at 09/30/23 1203   folic acid (FOLVITE) tablet 1 mg  1 mg Oral Daily Crosley,  Debby, MD   1 mg at 09/30/23 0847   Gerhardt's butt cream   Topical QID Maretta Bees, MD   Given at 09/30/23 0850   haloperidol lactate (HALDOL) injection 5 mg  5 mg Intravenous Once PRN Howerter, Justin B, DO       levothyroxine (SYNTHROID) tablet 175 mcg  175 mcg Oral Q0600 Crosley, Debby, MD   175 mcg at 09/30/23 0602   midodrine (PROAMATINE) tablet 5 mg  5 mg Oral TID Maretta Bees, MD   5 mg at 09/30/23 0847   pantoprazole (PROTONIX) EC tablet 40 mg  40 mg Oral BID Meredith Pel, NP   40 mg at 09/30/23 0847   senna-docusate (Senokot-S) tablet 1 tablet  1 tablet Oral QHS PRN Gery Pray, MD       sertraline (ZOLOFT) tablet 50 mg  50 mg Oral QHS Crosley, Debby, MD   50 mg at 09/29/23 2103   thiamine (VITAMIN B1) tablet 100 mg  100 mg Oral Daily Gery Pray, MD   100 mg at 09/30/23 4098     Discharge Medications: Please see discharge summary for a list of discharge medications.  Relevant Imaging Results:  Relevant Lab Results:   Additional Information SS# [id number]  Marliss Coots, LCSW

## 2023-09-30 NOTE — Progress Notes (Signed)
PROGRESS NOTE        PATIENT DETAILS Name: Philip Humphrey Age: 62 y.o. Sex: male Date of Birth: 1961/07/29 Admit Date: 09/24/2023 Admitting Physician Gery Pray, MD GEX:BMWUX, Fleet Contras, NP  Brief Summary: Patient is a 62 y.o.  male with history of EtOH use, hemochromatosis, hypothyroidism-who presented with dizziness/syncopal episode while out in his yard.  Per H&P-patient has had numerous episodes in the past 2-3 months.  He was found to have a hemoglobin of 5.9-and numerous electrolyte imbalances-and subsequently admitted to the hospitalist service.  Hospital course complicated by alcohol withdrawal and early delirium tremens.  Significant events: 8/2-8/5>> hospitalized for anemia/electrolyte abnormalities-left AMA before GI workup could be completed. 10/4>> admit to [facility]-dizziness/syncope-worsening anemia-electrolyte derangements.  Significant studies: 8/03>> echo: EF 65-70%. 10/4>> CXR: No acute findings 10/4>> CT head: No acute intracranial abnormality 10/5>> x-ray right ankle: No fracture 10/5>> x-ray left ankle: No fracture 10/5>> x-ray right knee: No fracture 10/5>> x-ray left knee: No fracture 10/5>> RUQ ultrasound: No acute findings 10/7>> CXR: No PNA  Significant microbiology data: None   Procedures: None none  Consults: GI  Subjective: Much improved-completely awake and alert this morning.  Objective: Vitals: Blood pressure 114/81, pulse 84, temperature 98.1 F (36.7 C), temperature source Oral, resp. rate 15, SpO2 97%.   Exam: Gen Exam:Alert awake-not in any distress HEENT:atraumatic, normocephalic Chest: B/L clear to auscultation anteriorly CVS:S1S2 regular Abdomen:soft non tender, non distended Extremities:no edema Neurology: Non focal Skin: no rash  Pertinent Labs/Radiology:    Latest Ref Rng & Units 09/30/2023    6:05 AM 09/29/2023    7:52 AM 09/28/2023    8:14 AM  CBC  WBC 4.0 - 10.5 K/uL 18.0  26.6  24.1    Hemoglobin 13.0 - 17.0 g/dL 9.9  32.4  40.1   Hematocrit 39.0 - 52.0 % 30.4  32.4  31.7   Platelets 150 - 400 K/uL 115  98  91     Lab Results  Component Value Date   NA 133 (L) 09/30/2023   K 4.0 09/30/2023   CL 100 09/30/2023   CO2 24 09/30/2023     Assessment/Plan: Dizziness/syncopal episodes Probably multifactorial in the setting of EtOH use/severe anemia/electrolyte abnormalities/dehydration Recent echo stable Telemetry stable PT/OT  Normocytic anemia Felt to be a combination of chronic blood loss/slow GI bleed-vitamin B12/folate deficiency Required 3 units of PRBC transfusion-last transfused on 10/6 Hb stable-follow  Probable upper GI bleeding No overt GI bleeding noted Hb stable Continue PPI Awaiting family to get recent endoscopy report from [facility] Per GI note-no plans for inpatient endoscopy as no overt bleeding-and Hb now stable.  Suspicion that patient may have had transient mucosal sloughing in the setting of EtOH use.  Hypokalemia Hypophosphatemia Hypomagnesemia Secondary to alcohol use Repleted aggressively-continue to follow.  Alcohol withdrawal with early DTs Significantly better-completely awake and alert today Should be out of the window for any further withdrawal symptoms Was on benzodiazepines per CIWA protocol. Continue thiamine.    Leukocytosis Afebrile UA/CXR negative At risk for aspiration given alcohol withdrawals-however no signs of pneumonia No other source of infection apparent Leukocytosis better today-continue to monitor off antibiotics.  Thrombocytopenia Likely due to EtOH use Platelet counts continue to slowly improve.  Heritage hemochromatosis Patient follow-up with oncology Needs to abstain from alcohol use  Chronic hypotension BP stable on midodrine  B12/folic acid  deficiency  B12 level on 10/4-stable On folic acid supplementation  Hypothyroidism Synthroid TSH minimally elevated on 8/2 Repeat TSH  in 6 weeks  Left eye conjunctivitis Ciprofloxacin eyedrops  Debility/deconditioning Secondary to acute illness PT/OT eval-SNF recommended  BMI: Estimated body mass index is 26.79 kg/m as calculated from the following:   Height as of 07/23/23: 5' 9.5" (1.765 m).   Weight as of 07/25/23: 83.5 kg.   Code status:   Code Status: Full Code   DVT Prophylaxis: SCDs   Family Communication: Daughter-[name]-[phone] updated 10/10   Disposition Plan: Status is: Inpatient Remains inpatient appropriate because: Severity  of illness   Planned Discharge Destination:Skilled nursing facility   Diet: Diet Order             Diet regular Room service appropriate? Yes; Fluid consistency: Thin  Diet effective now                     Antimicrobial agents: Anti-infectives (From admission, onward)    None        MEDICATIONS: Scheduled Meds:  ciprofloxacin  2 drop Both Eyes Q2H while awake   folic acid  1 mg Oral Daily   Gerhardt's butt cream   Topical QID   levothyroxine  175 mcg Oral Q0600   midodrine  5 mg Oral TID   pantoprazole  40 mg Oral BID   sertraline  50 mg Oral QHS   thiamine  100 mg Oral Daily   Continuous Infusions:   PRN Meds:.acetaminophen **OR** acetaminophen, haloperidol lactate, senna-docusate   I have personally reviewed following labs and imaging studies  LABORATORY DATA: CBC: Recent Labs  Lab 09/24/23 1945 09/25/23 0946 09/26/23 0444 09/27/23 0416 09/28/23 0814 09/29/23 0752 09/30/23 0605  WBC 13.8*   < > 14.8* 17.6* 24.1* 26.6* 18.0*  NEUTROABS 11.0*  --   --   --   --   --   --   HGB 5.9*   < > 9.6* 10.3* 10.5* 10.8* 9.9*  HCT 18.5*   < > 29.0* 31.6* 31.7* 32.4* 30.4*  MCV 115.6*   < > 97.3 99.7 98.8 99.1 100.3*  PLT 49*   < > 54* 69* 91* 98* 115*   < > = values in this interval not displayed.    Basic Metabolic Panel: Recent Labs  Lab 09/26/23 0444 09/27/23 0416 09/28/23 0320 09/29/23 0752 09/30/23 0605  NA 135 135 134*  134* 133*  K 3.8 3.4* 3.7 3.6 4.0  CL 101 101 99 101 100  CO2 25 22 25 22 24   GLUCOSE 93 74 82 93 88  BUN 5* <5* <5* 7* 5*  CREATININE 0.72 0.59* 0.63 0.59* 0.63  CALCIUM 7.5* 7.4* 7.7* 8.0* 8.1*  MG 2.0 1.6* 1.5* 1.4* 1.9  PHOS 2.1* 4.2 3.2 2.4* 3.0    GFR: CrCl cannot be calculated (Unknown ideal weight.).  Liver Function Tests: Recent Labs  Lab 09/24/23 1945 09/26/23 0444  AST 49* 42*  ALT 23 18  ALKPHOS 195* 180*  BILITOT 2.1* 2.5*  PROT 5.6* 4.9*  ALBUMIN 3.0* 2.6*   No results for input(s): "LIPASE", "AMYLASE" in the last 168 hours. No results for input(s): "AMMONIA" in the last 168 hours.  Coagulation Profile: Recent Labs  Lab 09/24/23 1943 09/26/23 0444  INR 1.3* 1.2    Cardiac Enzymes: No results for input(s): "CKTOTAL", "CKMB", "CKMBINDEX", "TROPONINI" in the last 168 hours.  BNP (last 3 results) No results for input(s): "PROBNP" in the last 8760 hours.  Lipid Profile: No results for input(s): "CHOL", "HDL", "LDLCALC", "TRIG", "CHOLHDL", "LDLDIRECT" in the last 72 hours.  Thyroid Function Tests: No results for input(s): "TSH", "T4TOTAL", "FREET4", "T3FREE", "THYROIDAB" in the last 72 hours.  Anemia Panel: No results for input(s): "VITAMINB12", "FOLATE", "FERRITIN", "TIBC", "IRON", "RETICCTPCT" in the last 72 hours.  Urine analysis:    Component Value Date/Time   COLORURINE AMBER (A) 09/27/2023 0107   APPEARANCEUR CLEAR 09/27/2023 0107   LABSPEC 1.012 09/27/2023 0107   PHURINE 7.0 09/27/2023 0107   GLUCOSEU NEGATIVE 09/27/2023 0107   HGBUR NEGATIVE 09/27/2023 0107   BILIRUBINUR SMALL (A) 09/27/2023 0107   KETONESUR NEGATIVE 09/27/2023 0107   PROTEINUR NEGATIVE 09/27/2023 0107   NITRITE POSITIVE (A) 09/27/2023 0107   LEUKOCYTESUR NEGATIVE 09/27/2023 0107    Sepsis Labs: Lactic Acid, Venous    Component Value Date/Time   LATICACIDVEN 1.2 07/24/2023 0458    MICROBIOLOGY: Recent Results (from the past 240 hour(s))  MRSA Next Gen by  PCR, Nasal     Status: None   Collection Time: 09/25/23  2:52 AM   Specimen: Nasal Mucosa; Nasal Swab  Result Value Ref Range Status   MRSA by PCR Next Gen NOT DETECTED NOT DETECTED Final    Comment: (NOTE) The GeneXpert MRSA Assay (FDA approved for NASAL specimens only), is one component of a comprehensive MRSA colonization surveillance program. It is not intended to diagnose MRSA infection nor to guide or monitor treatment for MRSA infections. Test performance is not FDA approved in patients less than 61 years old. Performed at [facility], [address] [place], [place] [address]     RADIOLOGY STUDIES/RESULTS: DG Chest Port 1 View  Result Date: 09/28/2023 CLINICAL DATA:  Leukocytosis EXAM: PORTABLE CHEST 1 VIEW COMPARISON:  09/24/2023 FINDINGS: Low lung volumes with minimal basilar atelectasis. No consolidation or effusion. Normal cardiac size. No pneumothorax IMPRESSION: Low lung volumes with minimal basilar atelectasis. Electronically Signed   By: Jasmine Pang M.D.   On: 09/28/2023 16:36     LOS: 6 days   Jeoffrey Massed, MD  [facility]    To contact the attending provider between 7A-7P or the covering provider during after hours 7P-7A, please log into the web site [email or web address] and access using universal [place] password for that web site. If you do not have the password, please call the hospital operator.  09/30/2023, 11:49 AM

## 2023-10-01 DIAGNOSIS — E876 Hypokalemia: Secondary | ICD-10-CM | POA: Diagnosis not present

## 2023-10-01 DIAGNOSIS — D649 Anemia, unspecified: Secondary | ICD-10-CM | POA: Diagnosis not present

## 2023-10-01 DIAGNOSIS — E032 Hypothyroidism due to medicaments and other exogenous substances: Secondary | ICD-10-CM | POA: Diagnosis not present

## 2023-10-01 DIAGNOSIS — F10231 Alcohol dependence with withdrawal delirium: Secondary | ICD-10-CM | POA: Diagnosis not present

## 2023-10-01 LAB — BASIC METABOLIC PANEL
Anion gap: 9 (ref 5–15)
BUN: <5 mg/dL — ABNORMAL LOW (ref 8–23)
CO2: 25 mmol/L (ref 22–32)
Calcium: 8.3 mg/dL — ABNORMAL LOW (ref 8.9–10.3)
Chloride: 101 mmol/L (ref 98–111)
Creatinine, Ser: 0.56 mg/dL — ABNORMAL LOW (ref 0.61–1.24)
GFR, Estimated: >60 mL/min (ref >60)
Glucose, Bld: 86 mg/dL (ref 70–99)
Potassium: 4 mmol/L (ref 3.5–5.1)
Sodium: 135 mmol/L (ref 135–145)

## 2023-10-01 LAB — CBC
HCT: 32.6 % — ABNORMAL LOW (ref 39.0–52.0)
Hemoglobin: 10.7 g/dL — ABNORMAL LOW (ref 13.0–17.0)
MCH: 32.5 pg (ref 26.0–34.0)
MCHC: 32.8 g/dL (ref 30.0–36.0)
MCV: 99.1 fL (ref 80.0–100.0)
Platelets: 120 10*3/uL — ABNORMAL LOW (ref 150–400)
RBC: 3.29 MIL/uL — ABNORMAL LOW (ref 4.22–5.81)
RDW: 20.7 % — ABNORMAL HIGH (ref 11.5–15.5)
WBC: 14.9 10*3/uL — ABNORMAL HIGH (ref 4.0–10.5)
nRBC: 0 % (ref 0.0–0.2)

## 2023-10-01 LAB — PHOSPHORUS: Phosphorus: 3.2 mg/dL (ref 2.5–4.6)

## 2023-10-01 LAB — MAGNESIUM: Magnesium: 1.9 mg/dL (ref 1.7–2.4)

## 2023-10-01 NOTE — Plan of Care (Signed)

## 2023-10-01 NOTE — TOC Progression Note (Addendum)
Transition of Care [facility]) - Progression Note    Patient Details  Name: HASSAAN CRITE MRN: [id number] Date of Birth: Jan 27, 1961  Transition of Care [facility]) CM/SW Contact  Marliss Coots, LCSW Phone Number: 10/01/2023, 9:42 AM  Clinical Narrative:     9:30AM: This CSW attempted to call patient's daughter/HPOA, Celedonio Miyamoto, regarding SNF placement offers. There was no response and a voicemail was left requesting a call back at [employer]'s work phone number.  9:45AM: Patient's daughter/HPOA, Celedonio Miyamoto, returned this CSW's phone call to discuss SNF placement offers. [name] is getting married this weekend and preferred for offers and a list of in-home care agencies to be emailed to her primary email address, [email or web address]. [name] also requested a goals of care meeting with the patient and a palliative care provider prior to patient's discharge.   10:15AM: This CSW emailed SNF placement offers and a list of local in-home care agencies to [employer] primary email address, requesting a SNF placement decision as soon as possible.  Expected Discharge Plan: Skilled Nursing Facility Barriers to Discharge: SNF Pending bed offer, Continued Medical Work up  Expected Discharge Plan and Services In-house Referral: Clinical Social Work   Post Acute Care Choice: Skilled Nursing Facility Living arrangements for the past 2 months: Single Family Home                   DME Agency: NA                   Social Determinants of Health (SDOH) Interventions SDOH Screenings   Food Insecurity: No Food Insecurity (09/24/2023)  Housing: Low Risk  (09/24/2023)  Transportation Needs: No Transportation Needs (09/24/2023)  Utilities: Not At Risk (09/24/2023)  Depression (PHQ2-9): Low Risk  (09/15/2022)  Tobacco Use: Medium Risk (09/24/2023)    Readmission Risk Interventions    07/26/2023   12:50 PM  Readmission Risk Prevention Plan  Transportation Screening Complete  PCP or Specialist  Appt within 5-7 Days Complete  Home Care Screening Complete  Medication Review (RN CM) Complete

## 2023-10-01 NOTE — Progress Notes (Addendum)
PROGRESS NOTE        PATIENT DETAILS Name: Philip Humphrey Age: 62 y.o. Sex: male Date of Birth: 03-Mar-1961 Admit Date: 09/24/2023 Admitting Physician Gery Pray, MD XLK:[id number], Fleet Contras, NP  Brief Summary: Patient is a 62 y.o.  male with history of EtOH use, hemochromatosis, hypothyroidism-who presented with dizziness/syncopal episode while out in his yard.  Per H&P-patient has had numerous episodes in the past 2-3 months.  He was found to have a hemoglobin of 5.9-and numerous electrolyte imbalances-and subsequently admitted to the hospitalist service.  Hospital course complicated by alcohol withdrawal and early delirium tremens.  Significant events: 8/2-8/5>> hospitalized for anemia/electrolyte abnormalities-left AMA before GI workup could be completed. 10/4>> admit to [facility]-dizziness/syncope-worsening anemia-electrolyte derangements.  Significant studies: 8/03>> echo: EF 65-70%. 10/4>> CXR: No acute findings 10/4>> CT head: No acute intracranial abnormality 10/5>> x-ray right ankle: No fracture 10/5>> x-ray left ankle: No fracture 10/5>> x-ray right knee: No fracture 10/5>> x-ray left knee: No fracture 10/5>> RUQ ultrasound: No acute findings 10/7>> CXR: No PNA  Significant microbiology data: None   Procedures: None none  Consults: GI  Subjective: No major issues overnight-lying comfortably in bed.  Objective: Vitals: Blood pressure 116/84, pulse 91, temperature 97.8 F (36.6 C), temperature source Oral, resp. rate 13, SpO2 98%.   Exam: Gen Exam:Alert awake-not in any distress HEENT:atraumatic, normocephalic Chest: B/L clear to auscultation anteriorly CVS:S1S2 regular Abdomen:soft non tender, non distended Extremities:no edema Neurology: Non focal Skin: no rash  Pertinent Labs/Radiology:    Latest Ref Rng & Units 10/01/2023    4:13 AM 09/30/2023    6:05 AM 09/29/2023    7:52 AM  CBC  WBC 4.0 - 10.5 K/uL 14.9  18.0  26.6    Hemoglobin 13.0 - 17.0 g/dL 27.2  9.9  53.6   Hematocrit 39.0 - 52.0 % 32.6  30.4  32.4   Platelets 150 - 400 K/uL 120  115  98     Lab Results  Component Value Date   NA 135 10/01/2023   K 4.0 10/01/2023   CL 101 10/01/2023   CO2 25 10/01/2023     Assessment/Plan: Dizziness/syncopal episodes Probably multifactorial in the setting of EtOH use/severe anemia/electrolyte abnormalities/dehydration Recent echo stable Telemetry stable PT/OT  Normocytic anemia Felt to be a combination of chronic blood loss/slow GI bleed-vitamin B12/folate deficiency Required 3 units of PRBC transfusion-last transfused on 10/6 Hb stable-follow  Probable upper GI bleeding No overt GI bleeding noted Hb stable Continue PPI Recent EGD done at [facility] any major findings (see below) Per GI note-no plans for inpatient endoscopy as no overt bleeding-and Hb now stable.  Suspicion that patient may have had transient mucosal sloughing in the setting of EtOH use.  Hypokalemia Hypophosphatemia Hypomagnesemia Secondary to alcohol use Repleted aggressively-continue to follow.  Alcohol withdrawal with early DTs Significantly better-completely awake and alert today Should be out of the window for any further withdrawal symptoms Was on benzodiazepines per CIWA protocol. Continue thiamine.    Leukocytosis Afebrile UA/CXR negative Leukocytosis slowly downtrending Clinically improving Continue to monitor off antibiotics.  Thrombocytopenia Likely due to EtOH use Platelet counts continue to slowly improve.  Hereditary hemochromatosis Patient follow-up with oncology Needs to abstain from alcohol use  Chronic hypotension BP stable on midodrine  B12/folic acid deficiency  B12 level on 10/4-stable On folic acid supplementation  Hypothyroidism Synthroid TSH minimally  elevated on 8/2 Repeat TSH in 6 weeks  Left eye conjunctivitis Ciprofloxacin  eyedrops  Debility/deconditioning Secondary to acute illness PT/OT eval-SNF recommended  BMI: Estimated body mass index is 26.79 kg/m as calculated from the following:   Height as of 07/23/23: 5' 9.5" (1.765 m).   Weight as of 07/25/23: 83.5 kg.    Code status:   Code Status: Full Code   DVT Prophylaxis: SCDs   Family Communication: Daughter-[name] updated 10/10         Disposition Plan: Status is: Inpatient Remains inpatient appropriate because: Severity  of illness   Planned Discharge Destination:Skilled nursing facility   Diet: Diet Order             Diet regular Room service appropriate? Yes; Fluid consistency: Thin  Diet effective now                     Antimicrobial agents: Anti-infectives (From admission, onward)    None        MEDICATIONS: Scheduled Meds:  ciprofloxacin  2 drop Both Eyes Q2H while awake   folic acid  1 mg Oral Daily   Gerhardt's butt cream   Topical QID   levothyroxine  175 mcg Oral Q0600   midodrine  5 mg Oral TID   pantoprazole  40 mg Oral BID   sertraline  50 mg Oral QHS   thiamine  100 mg Oral Daily   Continuous Infusions:   PRN Meds:.acetaminophen **OR** acetaminophen, haloperidol lactate, senna-docusate   I have personally reviewed following labs and imaging studies  LABORATORY DATA: CBC: Recent Labs  Lab 09/24/23 1945 09/25/23 0946 09/27/23 0416 09/28/23 0814 09/29/23 0752 09/30/23 0605 10/01/23 0413  WBC 13.8*   < > 17.6* 24.1* 26.6* 18.0* 14.9*  NEUTROABS 11.0*  --   --   --   --   --   --   HGB 5.9*   < > 10.3* 10.5* 10.8* 9.9* 10.7*  HCT 18.5*   < > 31.6* 31.7* 32.4* 30.4* 32.6*  MCV 115.6*   < > 99.7 98.8 99.1 100.3* 99.1  PLT 49*   < > 69* 91* 98* 115* 120*   < > = values in this interval not displayed.    Basic Metabolic Panel: Recent Labs  Lab 09/27/23 0416 09/28/23 0320 09/29/23 0752 09/30/23 0605 10/01/23 0413  NA 135 134* 134* 133* 135  K 3.4* 3.7 3.6 4.0 4.0   CL 101 99 101 100 101  CO2 22 25 22 24 25   GLUCOSE 74 82 93 88 86  BUN <5* <5* 7* 5* <5*  CREATININE 0.59* 0.63 0.59* 0.63 0.56*  CALCIUM 7.4* 7.7* 8.0* 8.1* 8.3*  MG 1.6* 1.5* 1.4* 1.9 1.9  PHOS 4.2 3.2 2.4* 3.0 3.2    GFR: CrCl cannot be calculated (Unknown ideal weight.).  Liver Function Tests: Recent Labs  Lab 09/24/23 1945 09/26/23 0444  AST 49* 42*  ALT 23 18  ALKPHOS 195* 180*  BILITOT 2.1* 2.5*  PROT 5.6* 4.9*  ALBUMIN 3.0* 2.6*   No results for input(s): "LIPASE", "AMYLASE" in the last 168 hours. No results for input(s): "AMMONIA" in the last 168 hours.  Coagulation Profile: Recent Labs  Lab 09/24/23 1943 09/26/23 0444  INR 1.3* 1.2    Cardiac Enzymes: No results for input(s): "CKTOTAL", "CKMB", "CKMBINDEX", "TROPONINI" in the last 168 hours.  BNP (last 3 results) No results for input(s): "PROBNP" in the last 8760 hours.  Lipid Profile: No results for input(s): "CHOL", "  HDL", "LDLCALC", "TRIG", "CHOLHDL", "LDLDIRECT" in the last 72 hours.  Thyroid Function Tests: No results for input(s): "TSH", "T4TOTAL", "FREET4", "T3FREE", "THYROIDAB" in the last 72 hours.  Anemia Panel: No results for input(s): "VITAMINB12", "FOLATE", "FERRITIN", "TIBC", "IRON", "RETICCTPCT" in the last 72 hours.  Urine analysis:    Component Value Date/Time   COLORURINE AMBER (A) 09/27/2023 0107   APPEARANCEUR CLEAR 09/27/2023 0107   LABSPEC 1.012 09/27/2023 0107   PHURINE 7.0 09/27/2023 0107   GLUCOSEU NEGATIVE 09/27/2023 0107   HGBUR NEGATIVE 09/27/2023 0107   BILIRUBINUR SMALL (A) 09/27/2023 0107   KETONESUR NEGATIVE 09/27/2023 0107   PROTEINUR NEGATIVE 09/27/2023 0107   NITRITE POSITIVE (A) 09/27/2023 0107   LEUKOCYTESUR NEGATIVE 09/27/2023 0107    Sepsis Labs: Lactic Acid, Venous    Component Value Date/Time   LATICACIDVEN 1.2 07/24/2023 0458    MICROBIOLOGY: Recent Results (from the past 240 hour(s))  MRSA Next Gen by PCR, Nasal     Status: None    Collection Time: 09/25/23  2:52 AM   Specimen: Nasal Mucosa; Nasal Swab  Result Value Ref Range Status   MRSA by PCR Next Gen NOT DETECTED NOT DETECTED Final    Comment: (NOTE) The GeneXpert MRSA Assay (FDA approved for NASAL specimens only), is one component of a comprehensive MRSA colonization surveillance program. It is not intended to diagnose MRSA infection nor to guide or monitor treatment for MRSA infections. Test performance is not FDA approved in patients less than 63 years old. Performed at [facility], [address] [place], [place] [address]     RADIOLOGY STUDIES/RESULTS: No results found.   LOS: 7 days   Jeoffrey Massed, MD  [facility]    To contact the attending provider between 7A-7P or the covering provider during after hours 7P-7A, please log into the web site [email or web address] and access using universal [place] password for that web site. If you do not have the password, please call the hospital operator.  10/01/2023, 11:00 AM

## 2023-10-01 NOTE — Progress Notes (Signed)
Mobility Specialist Progress Note;   10/01/23 1400  Mobility  Activity Ambulated with assistance in hallway  Level of Assistance Minimal assist, patient does 75% or more  Assistive Device Front wheel walker  Distance Ambulated (ft) 100 ft  Activity Response Tolerated well  Mobility Referral Yes  $Mobility charge 1 Mobility  Mobility Specialist Start Time (ACUTE ONLY) 1400  Mobility Specialist Stop Time (ACUTE ONLY) 1420  Mobility Specialist Time Calculation (min) (ACUTE ONLY) 20 min   Pt agreeable to mobility. Required minA assistance during ambulation, with min verbal cues for steering RW. Asx throughout session. Pt left in chair with all needs met.  Caesar Bookman Mobility Specialist Please contact via SecureChat or Rehab Office [phone]

## 2023-10-02 DIAGNOSIS — E876 Hypokalemia: Secondary | ICD-10-CM | POA: Diagnosis not present

## 2023-10-02 DIAGNOSIS — F10231 Alcohol dependence with withdrawal delirium: Secondary | ICD-10-CM | POA: Diagnosis not present

## 2023-10-02 DIAGNOSIS — E032 Hypothyroidism due to medicaments and other exogenous substances: Secondary | ICD-10-CM | POA: Diagnosis not present

## 2023-10-02 DIAGNOSIS — D649 Anemia, unspecified: Secondary | ICD-10-CM | POA: Diagnosis not present

## 2023-10-02 NOTE — Plan of Care (Signed)

## 2023-10-02 NOTE — Plan of Care (Signed)
Problem: Education: Goal: Knowledge of General Education information will improve Description Including pain rating scale, medication(s)/side effects and non-pharmacologic comfort measures Outcome: Progressing   Problem: Clinical Measurements: Goal: Ability to maintain clinical measurements within normal limits will improve Outcome: Progressing Goal: Will remain free from infection Outcome: Progressing   Problem: Safety: Goal: Ability to remain free from injury will improve Outcome: Progressing   Problem: Skin Integrity: Goal: Risk for impaired skin integrity will decrease Outcome: Progressing

## 2023-10-02 NOTE — Progress Notes (Signed)
PROGRESS NOTE        PATIENT DETAILS Name: Philip Humphrey Age: 62 y.o. Sex: male Date of Birth: 1961/08/11 Admit Date: 09/24/2023 Admitting Physician Gery Pray, MD WRU:[id number], Fleet Contras, NP  Brief Summary: Patient is a 62 y.o.  male with history of EtOH use, hemochromatosis, hypothyroidism-who presented with dizziness/syncopal episode while out in his yard.  Per H&P-patient has had numerous episodes in the past 2-3 months.  He was found to have a hemoglobin of 5.9-and numerous electrolyte imbalances-and subsequently admitted to the hospitalist service.  Hospital course complicated by alcohol withdrawal and early delirium tremens.  Significant events: 8/2-8/5>> hospitalized for anemia/electrolyte abnormalities-left AMA before GI workup could be completed. 10/4>> admit to [facility]-dizziness/syncope-worsening anemia-electrolyte derangements.  Significant studies: 8/03>> echo: EF 65-70%. 10/4>> CXR: No acute findings 10/4>> CT head: No acute intracranial abnormality 10/5>> x-ray right ankle: No fracture 10/5>> x-ray left ankle: No fracture 10/5>> x-ray right knee: No fracture 10/5>> x-ray left knee: No fracture 10/5>> RUQ ultrasound: No acute findings 10/7>> CXR: No PNA  Significant microbiology data: None   Procedures: None none  Consults: GI  Subjective: Appears a bit more confused this morning-but redirectable-moving all 4 extremities.  Objective: Vitals: Blood pressure 137/87, pulse 88, temperature 97.9 F (36.6 C), temperature source Oral, resp. rate 18, SpO2 97%.   Exam: Gen Exam:not in any distress HEENT:atraumatic, normocephalic Chest: B/L clear to auscultation anteriorly CVS:S1S2 regular Abdomen:soft non tender, non distended Extremities:no edema Neurology: Non focal Skin: no rash  Pertinent Labs/Radiology:    Latest Ref Rng & Units 10/01/2023    4:13 AM 09/30/2023    6:05 AM 09/29/2023    7:52 AM  CBC  WBC 4.0 - 10.5 K/uL  14.9  18.0  26.6   Hemoglobin 13.0 - 17.0 g/dL 98.1  9.9  19.1   Hematocrit 39.0 - 52.0 % 32.6  30.4  32.4   Platelets 150 - 400 K/uL 120  115  98     Lab Results  Component Value Date   NA 135 10/01/2023   K 4.0 10/01/2023   CL 101 10/01/2023   CO2 25 10/01/2023     Assessment/Plan: Dizziness/syncopal episodes Probably multifactorial in the setting of EtOH use/severe anemia/electrolyte abnormalities/dehydration Recent echo stable Telemetry stable PT/OT  Normocytic anemia Felt to be a combination of chronic blood loss/slow GI bleed-vitamin B12/folate deficiency Required 3 units of PRBC transfusion-last transfused on 10/6 Hb stable-follow  Probable upper GI bleeding No overt GI bleeding noted Hb stable Continue PPI Recent E[facility] done at [facility] any major findings (see below) Per GI note-no plans for inpatient endoscopy as no overt bleeding-and Hb now stable.  Suspicion that patient may have had transient mucosal sloughing in the setting of EtOH use.  Hypokalemia Hypophosphatemia Hypomagnesemia Secondary to alcohol use Repleted aggressively-continue to follow periodically.  Alcohol withdrawal with early DTs Significantly better Mostly awake and alert-but some mild confusion today Should be out of the window for any further withdrawal symptom Was on benzodiazepines per CIWA protocol. Continue thiamine.   Delirium precautions  Leukocytosis Afebrile UA/CXR negative Leukocytosis slowly downtrending Clinically improving Continue to monitor off antibiotics.  Thrombocytopenia Likely due to EtOH use Platelet counts continue to slowly improve.  Hereditary hemochromatosis Patient follow-up with oncology Needs to abstain from alcohol use  Chronic hypotension BP stable on midodrine  B12/folic acid deficiency B12 level on 10/4-stable  On folic acid supplementation  Hypothyroidism Synthroid TSH minimally elevated on 8/2 Repeat TSH in 6 weeks  Left  eye conjunctivitis Ciprofloxacin eyedrops  Debility/deconditioning Secondary to acute illness PT/OT eval-SNF recommended  BMI: Estimated body mass index is 26.79 kg/m as calculated from the following:   Height as of 07/23/23: 5' 9.5" (1.765 m).   Weight as of 07/25/23: 83.5 kg.    Code status:   Code Status: Full Code   DVT Prophylaxis: SCDs   Family Communication: Daughter-[name]-[phone] updated 10/10         Disposition Plan: Status is: Inpatient Remains inpatient appropriate because: Severity  of illness   Planned Discharge Destination:Skilled nursing facility   Diet: Diet Order             Diet regular Room service appropriate? Yes; Fluid consistency: Thin  Diet effective now                     Antimicrobial agents: Anti-infectives (From admission, onward)    None        MEDICATIONS: Scheduled Meds:  ciprofloxacin  2 drop Both Eyes Q2H while awake   folic acid  1 mg Oral Daily   Gerhardt's butt cream   Topical QID   levothyroxine  175 mcg Oral Q0600   midodrine  5 mg Oral TID   pantoprazole  40 mg Oral BID   sertraline  50 mg Oral QHS   thiamine  100 mg Oral Daily   Continuous Infusions:   PRN Meds:.acetaminophen **OR** acetaminophen, haloperidol lactate, senna-docusate   I have personally reviewed following labs and imaging studies  LABORATORY DATA: CBC: Recent Labs  Lab 09/27/23 0416 09/28/23 0814 09/29/23 0752 09/30/23 0605 10/01/23 0413  WBC 17.6* 24.1* 26.6* 18.0* 14.9*  HGB 10.3* 10.5* 10.8* 9.9* 10.7*  HCT 31.6* 31.7* 32.4* 30.4* 32.6*  MCV 99.7 98.8 99.1 100.3* 99.1  PLT 69* 91* 98* 115* 120*    Basic Metabolic Panel: Recent Labs  Lab 09/27/23 0416 09/28/23 0320 09/29/23 0752 09/30/23 0605 10/01/23 0413  NA 135 134* 134* 133* 135  K 3.4* 3.7 3.6 4.0 4.0  CL 101 99 101 100 101  CO2 22 25 22 24 25   GLUCOSE 74 82 93 88 86  BUN <5* <5* 7* 5* <5*  CREATININE 0.59* 0.63 0.59* 0.63 0.56*  CALCIUM 7.4*  7.7* 8.0* 8.1* 8.3*  MG 1.6* 1.5* 1.4* 1.9 1.9  PHOS 4.2 3.2 2.4* 3.0 3.2    GFR: CrCl cannot be calculated (Unknown ideal weight.).  Liver Function Tests: Recent Labs  Lab 09/26/23 0444  AST 42*  ALT 18  ALKPHOS 180*  BILITOT 2.5*  PROT 4.9*  ALBUMIN 2.6*   No results for input(s): "LIPASE", "AMYLASE" in the last 168 hours. No results for input(s): "AMMONIA" in the last 168 hours.  Coagulation Profile: Recent Labs  Lab 09/26/23 0444  INR 1.2    Cardiac Enzymes: No results for input(s): "CKTOTAL", "CKMB", "CKMBINDEX", "TROPONINI" in the last 168 hours.  BNP (last 3 results) No results for input(s): "PROBNP" in the last 8760 hours.  Lipid Profile: No results for input(s): "CHOL", "HDL", "LDLCALC", "TRIG", "CHOLHDL", "LDLDIRECT" in the last 72 hours.  Thyroid Function Tests: No results for input(s): "TSH", "T4TOTAL", "FREET4", "T3FREE", "THYROIDAB" in the last 72 hours.  Anemia Panel: No results for input(s): "VITAMINB12", "FOLATE", "FERRITIN", "TIBC", "IRON", "RETICCTPCT" in the last 72 hours.  Urine analysis:    Component Value Date/Time   COLORURINE AMBER (A) 09/27/2023 0107  APPEARANCEUR CLEAR 09/27/2023 0107   LABSPEC 1.012 09/27/2023 0107   PHURINE 7.0 09/27/2023 0107   GLUCOSEU NEGATIVE 09/27/2023 0107   HGBUR NEGATIVE 09/27/2023 0107   BILIRUBINUR SMALL (A) 09/27/2023 0107   KETONESUR NEGATIVE 09/27/2023 0107   PROTEINUR NEGATIVE 09/27/2023 0107   NITRITE POSITIVE (A) 09/27/2023 0107   LEUKOCYTESUR NEGATIVE 09/27/2023 0107    Sepsis Labs: Lactic Acid, Venous    Component Value Date/Time   LATICACIDVEN 1.2 07/24/2023 0458    MICROBIOLOGY: Recent Results (from the past 240 hour(s))  MRSA Next Gen by PCR, Nasal     Status: None   Collection Time: 09/25/23  2:52 AM   Specimen: Nasal Mucosa; Nasal Swab  Result Value Ref Range Status   MRSA by PCR Next Gen NOT DETECTED NOT DETECTED Final    Comment: (NOTE) The GeneXpert MRSA Assay (FDA  approved for NASAL specimens only), is one component of a comprehensive MRSA colonization surveillance program. It is not intended to diagnose MRSA infection nor to guide or monitor treatment for MRSA infections. Test performance is not FDA approved in patients less than 59 years old. Performed at [facility], [address] [place], [place] [address]     RADIOLOGY STUDIES/RESULTS: No results found.   LOS: 8 days   Jeoffrey Massed, MD  [facility]    To contact the attending provider between 7A-7P or the covering provider during after hours 7P-7A, please log into the web site [email or web address] and access using universal [place] password for that web site. If you do not have the password, please call the hospital operator.  10/02/2023, 10:02 AM

## 2023-10-03 DIAGNOSIS — F10231 Alcohol dependence with withdrawal delirium: Secondary | ICD-10-CM | POA: Diagnosis not present

## 2023-10-03 DIAGNOSIS — D649 Anemia, unspecified: Secondary | ICD-10-CM | POA: Diagnosis not present

## 2023-10-03 DIAGNOSIS — E032 Hypothyroidism due to medicaments and other exogenous substances: Secondary | ICD-10-CM | POA: Diagnosis not present

## 2023-10-03 DIAGNOSIS — E876 Hypokalemia: Secondary | ICD-10-CM | POA: Diagnosis not present

## 2023-10-03 LAB — BASIC METABOLIC PANEL
Anion gap: 7 (ref 5–15)
BUN: <5 mg/dL — ABNORMAL LOW (ref 8–23)
CO2: 25 mmol/L (ref 22–32)
Calcium: 8.1 mg/dL — ABNORMAL LOW (ref 8.9–10.3)
Chloride: 102 mmol/L (ref 98–111)
Creatinine, Ser: 0.52 mg/dL — ABNORMAL LOW (ref 0.61–1.24)
GFR, Estimated: >60 mL/min (ref >60)
Glucose, Bld: 81 mg/dL (ref 70–99)
Potassium: 3.6 mmol/L (ref 3.5–5.1)
Sodium: 134 mmol/L — ABNORMAL LOW (ref 135–145)

## 2023-10-03 LAB — PHOSPHORUS: Phosphorus: 3.1 mg/dL (ref 2.5–4.6)

## 2023-10-03 LAB — MAGNESIUM: Magnesium: 1.6 mg/dL — ABNORMAL LOW (ref 1.7–2.4)

## 2023-10-03 LAB — CBC
HCT: 30.1 % — ABNORMAL LOW (ref 39.0–52.0)
Hemoglobin: 10 g/dL — ABNORMAL LOW (ref 13.0–17.0)
MCH: 33.2 pg (ref 26.0–34.0)
MCHC: 33.2 g/dL (ref 30.0–36.0)
MCV: 100 fL (ref 80.0–100.0)
Platelets: 106 10*3/uL — ABNORMAL LOW (ref 150–400)
RBC: 3.01 MIL/uL — ABNORMAL LOW (ref 4.22–5.81)
RDW: 19.9 % — ABNORMAL HIGH (ref 11.5–15.5)
WBC: 13.3 10*3/uL — ABNORMAL HIGH (ref 4.0–10.5)
nRBC: 0 % (ref 0.0–0.2)

## 2023-10-03 NOTE — Progress Notes (Signed)
PROGRESS NOTE        PATIENT DETAILS Name: Philip Humphrey Age: 62 y.o. Sex: male Date of Birth: 06-Oct-1961 Admit Date: 09/24/2023 Admitting Physician Gery Pray, MD ZOX:[id number], Fleet Contras, NP  Brief Summary: Patient is a 62 y.o.  male with history of EtOH use, hemochromatosis, hypothyroidism-who presented with dizziness/syncopal episode while out in his yard.  Per H&P-patient has had numerous episodes in the past 2-3 months.  He was found to have a hemoglobin of 5.9-and numerous electrolyte imbalances-and subsequently admitted to the hospitalist service.  Hospital course complicated by alcohol withdrawal and early delirium tremens.  Significant events: 8/2-8/5>> hospitalized for anemia/electrolyte abnormalities-left AMA before GI workup could be completed. 10/4>> admit to [facility]-dizziness/syncope-worsening anemia-electrolyte derangements.  Significant studies: 8/03>> echo: EF 65-70%. 10/4>> CXR: No acute findings 10/4>> CT head: No acute intracranial abnormality 10/5>> x-ray right ankle: No fracture 10/5>> x-ray left ankle: No fracture 10/5>> x-ray right knee: No fracture 10/5>> x-ray left knee: No fracture 10/5>> RUQ ultrasound: No acute findings 10/7>> CXR: No PNA  Significant microbiology data: None   Procedures: None none  Consults: GI  Subjective: Completely awake/alert-claims he has a 40 year old mother that he sometimes has to help with numerous issues.  No major issues overnight-agreeable for SNF placement.  Objective: Vitals: Blood pressure (!) 137/100, pulse 85, temperature 98.4 F (36.9 C), temperature source Oral, resp. rate 13, SpO2 96%.   Exam: Gen Exam:Alert awake-not in any distress HEENT:atraumatic, normocephalic Chest: B/L clear to auscultation anteriorly CVS:S1S2 regular Abdomen:soft non tender, non distended Extremities:no edema Neurology: Non focal Skin: no rash  Pertinent Labs/Radiology:    Latest Ref Rng & Units  10/03/2023    8:13 AM 10/01/2023    4:13 AM 09/30/2023    6:05 AM  CBC  WBC 4.0 - 10.5 K/uL 13.3  14.9  18.0   Hemoglobin 13.0 - 17.0 g/dL 54.0  98.1  9.9   Hematocrit 39.0 - 52.0 % 30.1  32.6  30.4   Platelets 150 - 400 K/uL 106  120  115     Lab Results  Component Value Date   NA 135 10/01/2023   K 4.0 10/01/2023   CL 101 10/01/2023   CO2 25 10/01/2023     Assessment/Plan: Dizziness/syncopal episodes Probably multifactorial in the setting of EtOH use/severe anemia/electrolyte abnormalities/dehydration Recent echo stable Telemetry stable PT/OT  Normocytic anemia Felt to be a combination of chronic blood loss/slow GI bleed-vitamin B12/folate deficiency Required 3 units of PRBC transfusion-last transfused on 10/6 Hb stable-follow  Probable upper GI bleeding No overt GI bleeding noted Hb stable Continue PPI Recent EGD done at [facility] any major findings (see below) Per GI note-no plans for inpatient endoscopy as no overt bleeding-and Hb now stable.  Suspicion that patient may have had transient mucosal sloughing in the setting of EtOH use.  Hypokalemia Hypophosphatemia Hypomagnesemia Secondary to alcohol use Repleted aggressively-continue to follow periodically.  Alcohol withdrawal with early DTs Significantly better Mostly awake and alert-but some mild confusion today Should be out of the window for any further withdrawal symptom Was on benzodiazepines per CIWA protocol. Continue thiamine.   Delirium precautions  Leukocytosis Afebrile UA/CXR negative Leukocytosis slowly downtrending Clinically improving Continue to monitor off antibiotics.  Thrombocytopenia Likely due to EtOH use Platelet counts continue to slowly improve.  Hereditary hemochromatosis Patient follow-up with oncology Needs to abstain from alcohol use  Chronic hypotension BP stable on midodrine  B12/folic acid deficiency B12 level on 10/4-stable On folic acid  supplementation  Hypothyroidism Synthroid TSH minimally elevated on 8/2 Repeat TSH in 6 weeks  Left eye conjunctivitis Ciprofloxacin eyedrops  Debility/deconditioning Secondary to acute illness PT/OT eval-SNF recommended  BMI: Estimated body mass index is 26.79 kg/m as calculated from the following:   Height as of 07/23/23: 5' 9.5" (1.765 m).   Weight as of 07/25/23: 83.5 kg.    Code status:   Code Status: Full Code   DVT Prophylaxis: SCDs   Family Communication: Daughter-[name]-[phone] updated 10/10         Disposition Plan: Status is: Inpatient Remains inpatient appropriate because: Severity  of illness   Planned Discharge Destination:Skilled nursing facility   Diet: Diet Order             Diet regular Room service appropriate? Yes; Fluid consistency: Thin  Diet effective now                     Antimicrobial agents: Anti-infectives (From admission, onward)    None        MEDICATIONS: Scheduled Meds:  folic acid  1 mg Oral Daily   Gerhardt's butt cream   Topical QID   levothyroxine  175 mcg Oral Q0600   midodrine  5 mg Oral TID   pantoprazole  40 mg Oral BID   sertraline  50 mg Oral QHS   thiamine  100 mg Oral Daily   Continuous Infusions:   PRN Meds:.acetaminophen **OR** acetaminophen, haloperidol lactate, senna-docusate   I have personally reviewed following labs and imaging studies  LABORATORY DATA: CBC: Recent Labs  Lab 09/28/23 0814 09/29/23 0752 09/30/23 0605 10/01/23 0413 10/03/23 0813  WBC 24.1* 26.6* 18.0* 14.9* 13.3*  HGB 10.5* 10.8* 9.9* 10.7* 10.0*  HCT 31.7* 32.4* 30.4* 32.6* 30.1*  MCV 98.8 99.1 100.3* 99.1 100.0  PLT 91* 98* 115* 120* 106*    Basic Metabolic Panel: Recent Labs  Lab 09/27/23 0416 09/28/23 0320 09/29/23 0752 09/30/23 0605 10/01/23 0413  NA 135 134* 134* 133* 135  K 3.4* 3.7 3.6 4.0 4.0  CL 101 99 101 100 101  CO2 22 25 22 24 25   GLUCOSE 74 82 93 88 86  BUN <5* <5* 7* 5* <5*   CREATININE 0.59* 0.63 0.59* 0.63 0.56*  CALCIUM 7.4* 7.7* 8.0* 8.1* 8.3*  MG 1.6* 1.5* 1.4* 1.9 1.9  PHOS 4.2 3.2 2.4* 3.0 3.2    GFR: CrCl cannot be calculated (Unknown ideal weight.).  Liver Function Tests: No results for input(s): "AST", "ALT", "ALKPHOS", "BILITOT", "PROT", "ALBUMIN" in the last 168 hours.  No results for input(s): "LIPASE", "AMYLASE" in the last 168 hours. No results for input(s): "AMMONIA" in the last 168 hours.  Coagulation Profile: No results for input(s): "INR", "PROTIME" in the last 168 hours.   Cardiac Enzymes: No results for input(s): "CKTOTAL", "CKMB", "CKMBINDEX", "TROPONINI" in the last 168 hours.  BNP (last 3 results) No results for input(s): "PROBNP" in the last 8760 hours.  Lipid Profile: No results for input(s): "CHOL", "HDL", "LDLCALC", "TRIG", "CHOLHDL", "LDLDIRECT" in the last 72 hours.  Thyroid Function Tests: No results for input(s): "TSH", "T4TOTAL", "FREET4", "T3FREE", "THYROIDAB" in the last 72 hours.  Anemia Panel: No results for input(s): "VITAMINB12", "FOLATE", "FERRITIN", "TIBC", "IRON", "RETICCTPCT" in the last 72 hours.  Urine analysis:    Component Value Date/Time   COLORURINE AMBER (A) 09/27/2023 0107   APPEARANCEUR CLEAR 09/27/2023 0107  LABSPEC 1.012 09/27/2023 0107   PHURINE 7.0 09/27/2023 0107   GLUCOSEU NEGATIVE 09/27/2023 0107   HGBUR NEGATIVE 09/27/2023 0107   BILIRUBINUR SMALL (A) 09/27/2023 0107   KETONESUR NEGATIVE 09/27/2023 0107   PROTEINUR NEGATIVE 09/27/2023 0107   NITRITE POSITIVE (A) 09/27/2023 0107   LEUKOCYTESUR NEGATIVE 09/27/2023 0107    Sepsis Labs: Lactic Acid, Venous    Component Value Date/Time   LATICACIDVEN 1.2 07/24/2023 0458    MICROBIOLOGY: Recent Results (from the past 240 hour(s))  MRSA Next Gen by PCR, Nasal     Status: None   Collection Time: 09/25/23  2:52 AM   Specimen: Nasal Mucosa; Nasal Swab  Result Value Ref Range Status   MRSA by PCR Next Gen NOT DETECTED NOT  DETECTED Final    Comment: (NOTE) The GeneXpert MRSA Assay (FDA approved for NASAL specimens only), is one component of a comprehensive MRSA colonization surveillance program. It is not intended to diagnose MRSA infection nor to guide or monitor treatment for MRSA infections. Test performance is not FDA approved in patients less than 92 years old. Performed at [facility], [address], [place], [place] [address]     RADIOLOGY STUDIES/RESULTS: No results found.   LOS: 9 days   Jeoffrey Massed, MD  [facility]    To contact the attending provider between 7A-7P or the covering provider during after hours 7P-7A, please log into the web site [email or web address] and access using universal [place] password for that web site. If you do not have the password, please call the hospital operator.  10/03/2023, 8:51 AM

## 2023-10-03 NOTE — Plan of Care (Signed)
Problem: Education: Goal: Knowledge of General Education information will improve Description: Including pain rating scale, medication(s)/side effects and non-pharmacologic comfort measures Outcome: Progressing   Problem: Clinical Measurements: Goal: Ability to maintain clinical measurements within normal limits will improve Outcome: Progressing   Problem: Activity: Goal: Risk for activity intolerance will decrease Outcome: Progressing   Problem: Safety: Goal: Ability to remain free from injury will improve Outcome: Progressing   Problem: Skin Integrity: Goal: Risk for impaired skin integrity will decrease Outcome: Progressing

## 2023-10-04 DIAGNOSIS — Z515 Encounter for palliative care: Secondary | ICD-10-CM | POA: Diagnosis not present

## 2023-10-04 DIAGNOSIS — F1010 Alcohol abuse, uncomplicated: Secondary | ICD-10-CM | POA: Diagnosis not present

## 2023-10-04 DIAGNOSIS — Z66 Do not resuscitate: Secondary | ICD-10-CM | POA: Diagnosis not present

## 2023-10-04 DIAGNOSIS — D539 Nutritional anemia, unspecified: Secondary | ICD-10-CM | POA: Diagnosis not present

## 2023-10-04 DIAGNOSIS — R195 Other fecal abnormalities: Secondary | ICD-10-CM | POA: Diagnosis not present

## 2023-10-04 DIAGNOSIS — Z7189 Other specified counseling: Secondary | ICD-10-CM | POA: Diagnosis not present

## 2023-10-04 DIAGNOSIS — I10 Essential (primary) hypertension: Secondary | ICD-10-CM | POA: Diagnosis not present

## 2023-10-04 DIAGNOSIS — D693 Immune thrombocytopenic purpura: Secondary | ICD-10-CM | POA: Diagnosis not present

## 2023-10-04 MED ORDER — FAMOTIDINE 40 MG PO TABS: 40.0000 mg | ORAL_TABLET | Freq: Every day | ORAL | 0 refills | Status: AC | PRN

## 2023-10-04 MED ORDER — FOLIC ACID 1 MG PO TABS: 1.0000 mg | ORAL_TABLET | Freq: Every day | ORAL | 0 refills | Status: AC

## 2023-10-04 MED ORDER — KLOR-CON M20 20 MEQ PO TBCR: 20.0000 meq | EXTENDED_RELEASE_TABLET | Freq: Every day | ORAL | 0 refills | Status: DC

## 2023-10-04 MED ORDER — GERHARDT'S BUTT CREAM: 1.0000 | TOPICAL_CREAM | Freq: Four times a day (QID) | CUTANEOUS | Status: DC

## 2023-10-04 MED ORDER — TRAZODONE HCL 50 MG PO TABS
50.0000 mg | ORAL_TABLET | Freq: Every day | ORAL | 0 refills | Status: DC
Start: 2023-10-04 — End: 2023-10-28

## 2023-10-04 MED ORDER — SERTRALINE HCL 50 MG PO TABS: 50.0000 mg | ORAL_TABLET | Freq: Every day | ORAL | 0 refills | Status: DC

## 2023-10-04 MED ORDER — SYNTHROID 175 MCG PO TABS: 175.0000 ug | ORAL_TABLET | Freq: Every day | ORAL | 0 refills | Status: AC

## 2023-10-04 MED ORDER — VITAMIN D (ERGOCALCIFEROL) 1.25 MG (50000 UNIT) PO CAPS: 50000.0000 [IU] | ORAL_CAPSULE | ORAL | 0 refills | Status: AC

## 2023-10-04 MED ORDER — PANTOPRAZOLE SODIUM 40 MG PO TBEC: 40.0000 mg | DELAYED_RELEASE_TABLET | Freq: Two times a day (BID) | ORAL | Status: AC

## 2023-10-04 MED ORDER — MIDODRINE HCL 2.5 MG PO TABS: 5.0000 mg | ORAL_TABLET | Freq: Three times a day (TID) | ORAL | 0 refills | Status: AC

## 2023-10-04 MED ORDER — EYE WASH OP SOLN: 1.0000 [drp] | OPHTHALMIC | Status: DC | PRN

## 2023-10-04 MED ORDER — B-12 1000 MCG SL SUBL: 1000.0000 ug | SUBLINGUAL_TABLET | Freq: Every day | SUBLINGUAL | 0 refills | Status: DC

## 2023-10-04 MED ORDER — GABAPENTIN 300 MG PO CAPS: 300.0000 mg | ORAL_CAPSULE | Freq: Two times a day (BID) | ORAL | 0 refills | Status: DC

## 2023-10-04 MED ORDER — MAGNESIUM 400 MG PO TABS: 400.0000 mg | ORAL_TABLET | Freq: Every day | ORAL | 0 refills | Status: DC

## 2023-10-04 MED ORDER — VITAMIN B-1 100 MG PO TABS: 100.0000 mg | ORAL_TABLET | Freq: Every day | ORAL | Status: AC

## 2023-10-04 NOTE — Progress Notes (Signed)
Palliative:  HPI: 62 y.o. male   admitted on 09/24/2023 with   past medical history of alcohol abuse, chronic hypotension, chronic thrombocytopenia, hereditary hematochromatosis, hypothyroidism, alcohol abuse.   He was brought in today after he became dizzy and passed out while in his yard. Reported increased episodes of dizziness and syncopal episode in the past 2 or 3 months. The patient was recently admitted 8/2 to 8/5 with posterior dizziness and near syncope.  At that point patient's hemoglobin was 6.6.  He received 1 unit of blood.  GI was consulted. EGD/colonoscopy recommended.  Patient refused and signed out AMA. Patient has significant history of alcohol abuse. Patient and family face treatment option decisions, advanced directive decisions and anticipatory care needs  I discussed with daughter, [name], who voices concern for clarifying her father's wishes. I discussed with her the concerns and assistance she needs from her father. We agreed that I would speak with him and then update her on our conversation and his decisions.   I met and spoke with Mr. Letarte. We had a frank conversation about his alcohol use and the severity of this illness and hospitalization. We discussed that he may not be so lucky next time. I discussed with him his goals of care and wishes in the event that events were different. We discussed code status and he tells me that he would not want to be resuscitated. He does not want his daughter to suffer and see him go through that and "I would rather just go on to the afterlife." He waffles briefly saying that sometimes people can come back from resuscitation but then reiterates he does not want this. He agrees with DNR status.   I spent time discuss with Mr. Cacioppo his desire for going into inpatient alcohol rehab as supported by his family. Mr. Longton goes back and forth on this and if he really needs inpatient rehab. I spoke with him about the reality of his situation  and if he is truly interested in pursuing sobriety then inpatient rehab is what he needs. He is noncommittal but shares that he will consider this. I explained that [name] will be awaiting his answer. He also talks about SNF rehab and tells me that he would rather go home. I explained that he is not in a condition to go home and needs some time and assistance to regain physical strength.   I called and discussed with [name] that he did desire DNR as she thought he may. She is supportive of DNR status. I updated that I do not believe that he will be willing to pursue alcohol rehab but he did say he would consider. He did not want to pursue SNF rehab but ultimately agreed. [name] understands. She works with hospice and is preparing herself and family as she does feel that the next time he binges he may not survive. She wants to support him the best she can and support his wishes. She would be open to hospice support for him in the near future as indicated.   All questions/concerns addressed. Emotional support provided.   Exam: Alert, oriented. No distress. Breathing regular, unlabored. Abd soft. Moves all extremities.   Plan: - DNR decided - Outpatient palliative care referral to [facility]  50 min  Yong Channel, NP Palliative Medicine Team Pager [phone] (Please see [email or web address] for schedule) Team Phone [phone]    Greater than 50%  of this time was spent counseling and coordinating care related to the above assessment and plan

## 2023-10-04 NOTE — TOC CM/SW Note (Signed)
    Durable Medical Equipment  (From admission, onward)           Start     Ordered   10/04/23 1400  For home use only DME lightweight manual wheelchair with seat cushion  Once       Comments: Patient suffers from dizziness and syncopal episodes which impairs their ability to perform daily activities like bathing, dressing, feeding, grooming, and toileting in the home.  A cane, crutch, or walker will not resolve  issue with performing activities of daily living. A wheelchair will allow patient to safely perform daily activities. Patient is not able to propel themselves in the home using a standard weight wheelchair due to arm weakness and endurance. Patient can self propel in the lightweight wheelchair. Length of need Lifetime. Accessories: elevating leg rests (ELRs), wheel locks, extensions and anti-tippers.   10/04/23 1401

## 2023-10-04 NOTE — Progress Notes (Signed)
Mobility Specialist Progress Note;   10/04/23 1010  Mobility  Activity Ambulated with assistance in hallway  Level of Assistance Contact guard assist, steadying assist  Assistive Device Front wheel walker  Distance Ambulated (ft) 150 ft  Activity Response Tolerated well  Mobility Referral Yes  $Mobility charge 1 Mobility  Mobility Specialist Start Time (ACUTE ONLY) 1010  Mobility Specialist Stop Time (ACUTE ONLY) 1030  Mobility Specialist Time Calculation (min) (ACUTE ONLY) 20 min   Pt agreeable to mobility. Required minG assistance during ambulation. Asx throughout session. Pt left in bed with all needs met.   Caesar Bookman Mobility Specialist Please contact via SecureChat or Rehab Office [phone]

## 2023-10-04 NOTE — TOC Transition Note (Signed)
Transition of Care [facility]) - CM/SW Discharge Note   Patient Details  Name: Philip Humphrey MRN: [id number] Date of Birth: 05-Jun-1961  Transition of Care [facility]) CM/SW Contact:  Gordy Clement, RN Phone Number: 10/04/2023, 1:45 PM   Clinical Narrative:     Patient will DC to home today. Home Health will be provided by [facility] and a wheelchair will be delivered to home from [employer]. Patient will need a cab voucher provided by TOC.  No additional TOC needs       Barriers to Discharge: Barriers Resolved   Patient Goals and CMS Choice CMS Medicare.gov Compare Post Acute Care list provided to:: Patient Represenative (must comment) ([name] Powers; Daughter/POA; [phone]) Choice offered to / list presented to : Adult Children [name] Powers; Daughter/POA; [phone])  Discharge Placement                         Discharge Plan and Services Additional resources added to the After Visit Summary for   In-house Referral: Clinical Social Work   Post Acute Care Choice: Home Health            DME Agency: NA                  Social Determinants of Health (SDOH) Interventions SDOH Screenings   Food Insecurity: No Food Insecurity (09/24/2023)  Housing: Low Risk  (09/24/2023)  Transportation Needs: No Transportation Needs (09/24/2023)  Utilities: Not At Risk (09/24/2023)  Depression (PHQ2-9): Low Risk  (09/15/2022)  Tobacco Use: Medium Risk (09/24/2023)     Readmission Risk Interventions    07/26/2023   12:50 PM  Readmission Risk Prevention Plan  Transportation Screening Complete  PCP or Specialist Appt within 5-7 Days Complete  Home Care Screening Complete  Medication Review (RN CM) Complete

## 2023-10-04 NOTE — Plan of Care (Signed)

## 2023-10-04 NOTE — Discharge Summary (Addendum)
PATIENT DETAILS Name: Philip Humphrey Age: 62 y.o. Sex: male Date of Birth: 01-19-1961 MRN: [id number]. Admitting Physician: Gery Pray, MD WUJ:[id number], Fleet Contras, NP  Admit Date: 09/24/2023 Discharge date: 10/04/2023  Recommendations for Outpatient Follow-up:  Follow up with PCP in 1-2 weeks Please obtain CMP/CBC in one week Palliative care follow-up at SNF if possible for continued goals of care conversation.  Admitted From:  Home  Disposition: Skilled nursing facility   Discharge Condition: good  CODE STATUS:   Code Status: Limited: Do not attempt resuscitation (DNR) -DNR-LIMITED -Do Not Intubate/DNI    Diet recommendation:  Diet Order             Diet - low sodium heart healthy           Diet regular Room service appropriate? Yes; Fluid consistency: Thin  Diet effective now                    Brief Summary: Patient is a 62 y.o.  male with history of EtOH use, hemochromatosis, hypothyroidism-who presented with dizziness/syncopal episode while out in his yard.  Per H&P-patient has had numerous episodes in the past 2-3 months.  He was found to have a hemoglobin of 5.9-and numerous electrolyte imbalances-and subsequently admitted to the hospitalist service.  Hospital course complicated by alcohol withdrawal and early delirium tremens.   Significant events: 8/2-8/5>> hospitalized for anemia/electrolyte abnormalities-left AMA before GI workup could be completed. 10/4>> admit to [facility]-dizziness/syncope-worsening anemia-electrolyte derangements.   Significant studies: 8/03>> echo: EF 65-70%. 10/4>> CXR: No acute findings 10/4>> CT head: No acute intracranial abnormality 10/5>> x-ray right ankle: No fracture 10/5>> x-ray left ankle: No fracture 10/5>> x-ray right knee: No fracture 10/5>> x-ray left knee: No fracture 10/5>> RUQ ultrasound: No acute findings 10/7>> CXR: No PNA   Significant microbiology data: None    Procedures: None none   Consults: GI    Brief Hospital Course: Dizziness/syncopal episodes Probably multifactorial in the setting of EtOH use/severe anemia/electrolyte abnormalities/dehydration Recent echo stable Telemetry stable PT/OT eval complete-SNF planned.   Normocytic anemia Felt to be a combination of chronic blood loss/slow GI bleed-vitamin B12/folate deficiency Required 3 units of PRBC transfusion-last transfused on 10/6 Hb stable-follow   Probable upper GI bleeding No overt GI bleeding noted Hb stable Continue PPI Recent EGD done at [facility] any major findings (see below) Per GI note-no plans for inpatient endoscopy as no overt bleeding-and Hb now stable.  Suspicion that patient may have had transient mucosal sloughing in the setting of EtOH use.   Hypokalemia Hypophosphatemia Hypomagnesemia Secondary to alcohol use Repleted-now normalized-follow periodically.   Alcohol withdrawal with early DTs Treated with benzodiazepines per CIWA protocol Completely awake and alert Should be out of window for further withdrawal symptoms Continue thiamine on discharge.   Counseled extensively regarding importance of continuing to abstain from alcohol use.   Leukocytosis Afebrile UA/CXR negative Leukocytosis slowly downtrending Clinically improving Continue to monitor off antibiotics.   Thrombocytopenia Likely due to EtOH use Platelet counts continue to slowly improve.   Hereditary hemochromatosis Patient follow-up with oncology Needs to abstain from alcohol use   Chronic hypotension BP stable on midodrine   B12/folic acid deficiency B12 level on 10/4-stable On folic acid supplementation   Hypothyroidism Synthroid TSH minimally elevated on 8/2 Repeat TSH in 6 weeks   Left eye conjunctivitis s/p Ciprofloxacin eyedrops   Debility/deconditioning Secondary to acute illness PT/OT eval-SNF recommended   BMI: Estimated body mass index is 26.79 kg/m as calculated from  the following:    Height as of 07/23/23: 5' 9.5" (1.765 m).   Weight as of 07/25/23: 83.5 kg.     Addendum Patient now refusing to go to SNF-he wants to go home-when asked if he has talked with his family "my daughter is not my boss".  He is completely awake and alert-I subsequently spoke with his daughter Lyla Son over the phone-she is aware of this decision and she is not surprised.  Will allow some time for family conversation-and if patient still wants to go home-will discharge him home with home health.  Discharge Diagnoses:  Principal Problem:   Macrocytic anemia Active Problems:   Hypokalemia   Acute on chronic anemia   Hypothyroidism   Essential hypertension   Hereditary hemochromatosis (HCC)   Chronic idiopathic thrombocytopenia (HCC)   Heme positive stool   Anemia due to chronic blood loss   Alcohol withdrawal delirium, acute, mixed level of activity [facility])   Discharge Instructions:  Activity:  As tolerated with Full fall precautions use walker/cane & assistance as needed  Discharge Instructions     Call MD for:  difficulty breathing, headache or visual disturbances   Complete by: As directed    Call MD for:  extreme fatigue   Complete by: As directed    Call MD for:  persistant dizziness or light-headedness   Complete by: As directed    Diet - low sodium heart healthy   Complete by: As directed    Discharge instructions   Complete by: As directed    Follow with Primary MD  Courtney Paris, NP in 1-2 weeks  Please get a complete blood count and chemistry panel checked by your Primary MD at your next visit, and again as instructed by your Primary MD.  Get Medicines reviewed and adjusted: Please take all your medications with you for your next visit with your Primary MD  Laboratory/radiological data: Please request your Primary MD to go over all hospital tests and procedure/radiological results at the follow up, please ask your Primary MD to get all Hospital records sent to his/her  office.  In some cases, they will be blood work, cultures and biopsy results pending at the time of your discharge. Please request that your primary care M.D. follows up on these results.  Also Note the following: If you experience worsening of your admission symptoms, develop shortness of breath, life threatening emergency, suicidal or homicidal thoughts you must seek medical attention immediately by calling 911 or calling your MD immediately  if symptoms less severe.  You must read complete instructions/literature along with all the possible adverse reactions/side effects for all the Medicines you take and that have been prescribed to you. Take any new Medicines after you have completely understood and accpet all the possible adverse reactions/side effects.   Do not drive when taking Pain medications or sleeping medications (Benzodaizepines)  Do not take more than prescribed Pain, Sleep and Anxiety Medications. It is not advisable to combine anxiety,sleep and pain medications without talking with your primary care practitioner  Special Instructions: If you have smoked or chewed Tobacco  in the last 2 yrs please stop smoking, stop any regular Alcohol  and or any Recreational drug use.  Wear Seat belts while driving.  Please note: You were cared for by a hospitalist during your hospital stay. Once you are discharged, your primary care physician will handle any further medical issues. Please note that NO REFILLS for any discharge medications will be authorized once you are discharged,  as it is imperative that you return to your primary care physician (or establish a relationship with a primary care physician if you do not have one) for your post hospital discharge needs so that they can reassess your need for medications and monitor your lab values.   Increase activity slowly   Complete by: As directed       Allergies as of 10/04/2023       Reactions   Sulfa Antibiotics Anaphylaxis,  Swelling, Other (See Comments)   Mouth and throat swelling   Oxycodone Itching   Nickel Rash, Other (See Comments)   Caused cold sores        Medication List     STOP taking these medications    atorvastatin 80 MG tablet Commonly known as: LIPITOR   lisinopril 20 MG tablet Commonly known as: ZESTRIL   olmesartan 20 MG tablet Commonly known as: BENICAR       TAKE these medications    B-12 1000 MCG Subl Place 1,000 mcg under the tongue daily.   bismuth subsalicylate 262 MG/15ML suspension Commonly known as: PEPTO BISMOL Take 30 mLs by mouth every 6 (six) hours as needed for indigestion (stomach pain).   diclofenac Sodium 1 % Gel Commonly known as: VOLTAREN Apply 1 application topically 2 (two) times daily as needed (pain).   diphenhydramine-acetaminophen 25-500 MG Tabs tablet Commonly known as: TYLENOL PM Take 2-3 tablets by mouth at bedtime as needed (for sleep).   famotidine 40 MG tablet Commonly known as: PEPCID Take 1 tablet (40 mg total) by mouth daily as needed for heartburn or indigestion.   folic acid 1 MG tablet Commonly known as: FOLVITE Take 1 tablet (1 mg total) by mouth daily.   gabapentin 300 MG capsule Commonly known as: NEURONTIN Take 1-2 capsules (300-600 mg total) by mouth 2 (two) times daily. Take 1 capsule (300 mg) in the morning and Take 2 capsules (600 mg) at bedtime   Gerhardt's butt cream Crea Apply 1 Application topically 4 (four) times daily.   Klor-Con M20 20 MEQ tablet Generic drug: potassium chloride SA Take 1 tablet (20 mEq total) by mouth daily.   Magnesium 400 MG Tabs Take 400 mg by mouth daily.   MELATONIN PO Take 1 tablet by mouth at bedtime as needed (sleep).   midodrine 2.5 MG tablet Commonly known as: PROAMATINE Take 2 tablets (5 mg total) by mouth 3 (three) times daily.   nitroGLYCERIN 0.4 MG SL tablet Commonly known as: NITROSTAT Place 0.4 mg under the tongue every 5 (five) minutes as needed for chest  pain.   pantoprazole 40 MG tablet Commonly known as: PROTONIX Take 1 tablet (40 mg total) by mouth 2 (two) times daily.   sertraline 50 MG tablet Commonly known as: ZOLOFT Take 1 tablet (50 mg total) by mouth at bedtime.   Synthroid 175 MCG tablet Generic drug: levothyroxine Take 1 tablet (175 mcg total) by mouth daily. NO MORE REFILLS WITHOUT OFFICE VISIT - 2ND NOTICE   thiamine 100 MG tablet Commonly known as: Vitamin B-1 Take 1 tablet (100 mg total) by mouth daily.   traZODone 50 MG tablet Commonly known as: DESYREL Take 1 tablet (50 mg total) by mouth at bedtime.   Vitamin D (Ergocalciferol) 1.25 MG (50000 UNIT) Caps capsule Commonly known as: DRISDOL Take 1 capsule (50,000 Units total) by mouth every 7 (seven) days.        Contact information for follow-up providers     Courtney Paris, NP. Schedule an  appointment as soon as possible for a visit in 1 week(s).   Specialty: Nurse Practitioner Contact information: [address] [place] [address] [phone]              Contact information for after-discharge care     Destination     [facility] Preferred SNF .   Service: Skilled Nursing Contact information: [address] [place] [address] [phone]                    Allergies  Allergen Reactions   Sulfa Antibiotics Anaphylaxis, Swelling and Other (See Comments)    Mouth and throat swelling   Oxycodone Itching   Nickel Rash and Other (See Comments)    Caused cold sores     Other Procedures/Studies:          DG Chest Port 1 View  Result Date: 09/28/2023 CLINICAL DATA:  Leukocytosis EXAM: PORTABLE CHEST 1 VIEW COMPARISON:  09/24/2023 FINDINGS: Low lung volumes with minimal basilar atelectasis. No consolidation or effusion. Normal cardiac size. No pneumothorax IMPRESSION: Low lung volumes with minimal basilar atelectasis. Electronically Signed   By: Jasmine Pang M.D.    On: 09/28/2023 16:36   US Abdomen Limited RUQ (LIVER/GB)  Result Date: 09/25/2023 CLINICAL DATA:  Thrombocytopenia EXAM: ULTRASOUND ABDOMEN LIMITED RIGHT UPPER QUADRANT COMPARISON:  CT 07/23/2023 FINDINGS: Gallbladder: No gallstones or wall thickening visualized. No sonographic Murphy sign noted by sonographer. Common bile duct: Diameter: 6 mm.  No intrahepatic biliary ductal dilatation. Liver: No focal lesion identified. Coarse echogenic parenchyma. Portal vein is patent on color Doppler imaging with normal direction of blood flow towards the liver. Other: Technologist describes technically difficult study secondary to body habitus. IMPRESSION: 1. No acute findings. 2. Coarse echogenic liver parenchyma suggesting steatosis. Electronically Signed   By: Corlis Leak M.D.   On: 09/25/2023 10:37   DG Knee 1-2 Views Right  Result Date: 09/25/2023 CLINICAL DATA:  Larey Seat EXAM: RIGHT KNEE - 1-2 VIEW COMPARISON:  None Available. FINDINGS: No fracture, dislocation, or effusion. Normal alignment and mineralization. Mild chondrocalcinosis in medial and lateral compartments suggesting CPPD. No significant osseous degenerative change. IMPRESSION: 1. No fracture or other acute findings. 2. Chondrocalcinosis suggesting CPPD. Electronically Signed   By: Corlis Leak M.D.   On: 09/25/2023 10:36   DG Ankle 2 Views Right  Result Date: 09/25/2023 CLINICAL DATA:  Larey Seat EXAM: RIGHT ANKLE - 2 VIEW COMPARISON:  None Available. FINDINGS: There is no evidence of fracture, dislocation, or joint effusion. Small calcaneal spur. There is no evidence of arthropathy or other focal bone abnormality. Soft tissues are unremarkable. IMPRESSION: Negative. Electronically Signed   By: Corlis Leak M.D.   On: 09/25/2023 10:35   DG Ankle 2 Views Left  Result Date: 09/25/2023 CLINICAL DATA:  Larey Seat EXAM: LEFT ANKLE - 2 VIEW COMPARISON:  None Available. FINDINGS: There is no evidence of fracture, dislocation, or joint effusion. Calcaneal spur. There is  no evidence of arthropathy or other focal bone abnormality. Soft tissues are unremarkable. IMPRESSION: Negative. Electronically Signed   By: Corlis Leak M.D.   On: 09/25/2023 10:35   DG Knee 1-2 Views Left  Result Date: 09/25/2023 CLINICAL DATA:  Larey Seat EXAM: LEFT KNEE - 1-2 VIEW COMPARISON:  None Available. FINDINGS: No evidence of fracture, dislocation, or joint effusion. Small exostosis from the proximal fibular shaft, without aggressive periosteal reaction. No evidence of arthropathy or other focal bone abnormality. Soft tissues are unremarkable. IMPRESSION: 1. No acute findings.  2. Small proximal fibular exostosis.  Recommend   MRI if painful. Electronically Signed   By: Corlis Leak M.D.   On: 09/25/2023 10:34   CT Head Wo Contrast  Result Date: 09/24/2023 CLINICAL DATA:  Fall, dizziness, headache. EXAM: CT HEAD WITHOUT CONTRAST TECHNIQUE: Contiguous axial images were obtained from the base of the skull through the vertex without intravenous contrast. RADIATION DOSE REDUCTION: This exam was performed according to the departmental dose-optimization program which includes automated exposure control, adjustment of the mA and/or kV according to patient size and/or use of iterative reconstruction technique. COMPARISON:  07/23/2023 FINDINGS: Brain: No intracranial hemorrhage, mass effect, or midline shift. Brain volume is normal for age. No hydrocephalus. The basilar cisterns are patent. Minor periventricular chronic small vessel ischemia. No evidence of territorial infarct or acute ischemia. No extra-axial or intracranial fluid collection. Vascular: No hyperdense vessel or unexpected calcification. Skull: No fracture or focal lesion. Sinuses/Orbits: No acute findings. Partial opacification of left mastoid air cells, unchanged. Other: None. IMPRESSION: 1. No acute intracranial abnormality. No skull fracture. 2. Minor chronic small vessel ischemia. Electronically Signed   By: Narda Rutherford M.D.   On: 09/24/2023  21:36   DG Chest Portable 1 View  Result Date: 09/24/2023 CLINICAL DATA:  Chest pain and weakness. EXAM: PORTABLE CHEST 1 VIEW COMPARISON:  07/23/2023 FINDINGS: Low lung volumes persist. Normal heart size with unchanged mediastinal contours. No focal airspace disease. No pleural effusion or pneumothorax. No pulmonary edema. IMPRESSION: Low lung volumes without acute chest finding. Electronically Signed   By: Narda Rutherford M.D.   On: 09/24/2023 21:32     TODAY-DAY OF DISCHARGE:  Subjective:   Broghan Pannone today has no headache,no chest abdominal pain,no new weakness tingling or numbness, feels much better wants to go home today.   Objective:   Blood pressure (!) 135/98, pulse (!) 102, temperature 98.3 F (36.8 C), temperature source Oral, resp. rate 12, SpO2 95%.  Intake/Output Summary (Last 24 hours) at 10/04/2023 1145 Last data filed at 10/04/2023 0528 Gross per 24 hour  Intake 120 ml  Output 1750 ml  Net -1630 ml   There were no vitals filed for this visit.  Exam: Awake Alert, Oriented *3, No new F.N deficits, Normal affect [place].AT,PERRAL Supple Neck,No JVD, No cervical lymphadenopathy appriciated.  Symmetrical Chest wall movement, Good air movement bilaterally, CTAB RRR,No Gallops,Rubs or new Murmurs, No Parasternal Heave +ve B.Sounds, Abd Soft, Non tender, No organomegaly appriciated, No rebound -guarding or rigidity. No Cyanosis, Clubbing or edema, No new Rash or bruise   PERTINENT RADIOLOGIC STUDIES: No results found.   PERTINENT LAB RESULTS: CBC: Recent Labs    10/03/23 0813  WBC 13.3*  HGB 10.0*  HCT 30.1*  PLT 106*   CMET CMP     Component Value Date/Time   NA 134 (L) 10/03/2023 0813   K 3.6 10/03/2023 0813   CL 102 10/03/2023 0813   CO2 25 10/03/2023 0813   GLUCOSE 81 10/03/2023 0813   BUN <5 (L) 10/03/2023 0813   CREATININE 0.52 (L) 10/03/2023 0813   CREATININE 0.79 08/26/2022 1520   CREATININE 1.22 07/13/2013 1253   CALCIUM 8.1 (L)  10/03/2023 0813   PROT 4.9 (L) 09/26/2023 0444   ALBUMIN 2.6 (L) 09/26/2023 0444   AST 42 (H) 09/26/2023 0444   AST 47 (H) 08/26/2022 1520   ALT 18 09/26/2023 0444   ALT 23 08/26/2022 1520   ALKPHOS 180 (H) 09/26/2023 0444   BILITOT 2.5 (H) 09/26/2023 0444  BILITOT 0.7 08/26/2022 1520   GFRNONAA >60 10/03/2023 0813   GFRNONAA >60 08/26/2022 1520    GFR CrCl cannot be calculated (Unknown ideal weight.). No results for input(s): "LIPASE", "AMYLASE" in the last 72 hours. No results for input(s): "CKTOTAL", "CKMB", "CKMBINDEX", "TROPONINI" in the last 72 hours. Invalid input(s): "POCBNP" No results for input(s): "DDIMER" in the last 72 hours. No results for input(s): "HGBA1C" in the last 72 hours. No results for input(s): "CHOL", "HDL", "LDLCALC", "TRIG", "CHOLHDL", "LDLDIRECT" in the last 72 hours. No results for input(s): "TSH", "T4TOTAL", "T3FREE", "THYROIDAB" in the last 72 hours.  Invalid input(s): "FREET3" No results for input(s): "VITAMINB12", "FOLATE", "FERRITIN", "TIBC", "IRON", "RETICCTPCT" in the last 72 hours. Coags: No results for input(s): "INR" in the last 72 hours.  Invalid input(s): "PT" Microbiology: Recent Results (from the past 240 hour(s))  MRSA Next Gen by PCR, Nasal     Status: None   Collection Time: 09/25/23  2:52 AM   Specimen: Nasal Mucosa; Nasal Swab  Result Value Ref Range Status   MRSA by PCR Next Gen NOT DETECTED NOT DETECTED Final    Comment: (NOTE) The GeneXpert MRSA Assay (FDA approved for NASAL specimens only), is one component of a comprehensive MRSA colonization surveillance program. It is not intended to diagnose MRSA infection nor to guide or monitor treatment for MRSA infections. Test performance is not FDA approved in patients less than 68 years old. Performed at [facility], [address], [place], [place] [address]     FURTHER DISCHARGE INSTRUCTIONS:  Get Medicines reviewed and adjusted: Please take all your  medications with you for your next visit with your Primary MD  Laboratory/radiological data: Please request your Primary MD to go over all hospital tests and procedure/radiological results at the follow up, please ask your Primary MD to get all Hospital records sent to his/her office.  In some cases, they will be blood work, cultures and biopsy results pending at the time of your discharge. Please request that your primary care M.D. goes through all the records of your hospital data and follows up on these results.  Also Note the following: If you experience worsening of your admission symptoms, develop shortness of breath, life threatening emergency, suicidal or homicidal thoughts you must seek medical attention immediately by calling 911 or calling your MD immediately  if symptoms less severe.  You must read complete instructions/literature along with all the possible adverse reactions/side effects for all the Medicines you take and that have been prescribed to you. Take any new Medicines after you have completely understood and accpet all the possible adverse reactions/side effects.   Do not drive when taking Pain medications or sleeping medications (Benzodaizepines)  Do not take more than prescribed Pain, Sleep and Anxiety Medications. It is not advisable to combine anxiety,sleep and pain medications without talking with your primary care practitioner  Special Instructions: If you have smoked or chewed Tobacco  in the last 2 yrs please stop smoking, stop any regular Alcohol  and or any Recreational drug use.  Wear Seat belts while driving.  Please note: You were cared for by a hospitalist during your hospital stay. Once you are discharged, your primary care physician will handle any further medical issues. Please note that NO REFILLS for any discharge medications will be authorized once you are discharged, as it is imperative that you return to your primary care physician (or establish a  relationship with a primary care physician if you do not have  one) for your post hospital discharge needs so that they can reassess your need for medications and monitor your lab values.  Total Time spent coordinating discharge including counseling, education and face to face time equals greater than 30 minutes.  SignedJeoffrey Massed 10/04/2023 11:45 AM

## 2023-10-04 NOTE — TOC Progression Note (Addendum)
Transition of Care [facility]) - Progression Note    Patient Details  Name: Philip Humphrey MRN: [id number] Date of Birth: [birth year]  Transition of Care [facility]) CM/SW Contact  Marliss Coots, LCSW Phone Number: 10/04/2023, 9:08 AM  Clinical Narrative:     9:30AM: This CSW received SNF placement decision ([facility]) via email from patient's daughter/POA [name] Powers over the weekend. CSW called Celedonio Miyamoto to confirm SNF placement decision and relay that patient is to be discharged today. [name] Powers inquired about a goals of care meeting with palliative care prior to discharge. This CSW relayed that this could be done outpatient during patient's admission at [facility]. [name] Powers expressed understanding of the following information.  11:00AM: This CSW and Cristobal Goldmann, LCSW reiterated discharge plan to patient. Patient declined SNF placement offer and stated that he wanted to go home. CSW and Cristobal Goldmann attempted to call patient's daughter/POA, Celedonio Miyamoto, but there was no answer and a voicemail was left. Upon leaving the room to head to another progression meeting [name] Powers called back Cristobal Goldmann, who relayed to her what patient said. [name] Powers said she will be available today for a call back once social workers were back in patient's room.  12:00PM: This CSW and [place]; LCSW spoke with patient and his daughter via phone to discuss discharge planning. Patient refused SNF placement and accepted Home Health. He stated that he would let them come into his home for recommended therapy as needed. CSW and Cristobal Goldmann, LCSW explained that patient will needs to coordinate his transport as daughter is at work. CSW and Cristobal Goldmann, LCSW requested RNCM to coordinate Home Health.  Expected Discharge Plan: Skilled Nursing Facility ([facility]) Barriers to Discharge: Continued Medical Work up  Expected Discharge Plan and Services In-house Referral: Clinical Social Work    Post Acute Care Choice: Skilled Nursing Facility ([facility]) Living arrangements for the past 2 months: Single Family Home Expected Discharge Date: 10/04/23                 DME Agency: NA                   Social Determinants of Health (SDOH) Interventions SDOH Screenings   Food Insecurity: No Food Insecurity (09/24/2023)  Housing: Low Risk  (09/24/2023)  Transportation Needs: No Transportation Needs (09/24/2023)  Utilities: Not At Risk (09/24/2023)  Depression (PHQ2-9): Low Risk  (09/15/2022)  Tobacco Use: Medium Risk (09/24/2023)    Readmission Risk Interventions    07/26/2023   12:50 PM  Readmission Risk Prevention Plan  Transportation Screening Complete  PCP or Specialist Appt within 5-7 Days Complete  Home Care Screening Complete  Medication Review (RN CM) Complete

## 2023-10-24 ENCOUNTER — Emergency Department (HOSPITAL_COMMUNITY): Payer: Medicare Other

## 2023-10-24 ENCOUNTER — Inpatient Hospital Stay (HOSPITAL_COMMUNITY)
Admission: EM | Admit: 2023-10-24 | Discharge: 2023-10-28 | DRG: 896 | Disposition: A | Discharge status: Home Health | Payer: Medicare Other | Attending: Family Medicine | Admitting: Family Medicine

## 2023-10-24 DIAGNOSIS — D72829 Elevated white blood cell count, unspecified: Principal | ICD-10-CM | POA: Diagnosis present

## 2023-10-24 DIAGNOSIS — R296 Repeated falls: Secondary | ICD-10-CM | POA: Diagnosis present

## 2023-10-24 DIAGNOSIS — Z79890 Hormone replacement therapy (postmenopausal): Secondary | ICD-10-CM

## 2023-10-24 DIAGNOSIS — Z8249 Family history of ischemic heart disease and other diseases of the circulatory system: Secondary | ICD-10-CM

## 2023-10-24 DIAGNOSIS — E890 Postprocedural hypothyroidism: Secondary | ICD-10-CM | POA: Diagnosis present

## 2023-10-24 DIAGNOSIS — M47816 Spondylosis without myelopathy or radiculopathy, lumbar region: Secondary | ICD-10-CM | POA: Diagnosis present

## 2023-10-24 DIAGNOSIS — F1010 Alcohol abuse, uncomplicated: Principal | ICD-10-CM | POA: Diagnosis present

## 2023-10-24 DIAGNOSIS — Z79899 Other long term (current) drug therapy: Secondary | ICD-10-CM

## 2023-10-24 DIAGNOSIS — W19XXXA Unspecified fall, initial encounter: Secondary | ICD-10-CM | POA: Diagnosis not present

## 2023-10-24 DIAGNOSIS — Z91048 Other nonmedicinal substance allergy status: Secondary | ICD-10-CM

## 2023-10-24 DIAGNOSIS — R42 Dizziness and giddiness: Secondary | ICD-10-CM | POA: Diagnosis present

## 2023-10-24 DIAGNOSIS — R748 Abnormal levels of other serum enzymes: Secondary | ICD-10-CM | POA: Diagnosis present

## 2023-10-24 DIAGNOSIS — R1010 Upper abdominal pain, unspecified: Secondary | ICD-10-CM

## 2023-10-24 DIAGNOSIS — Z885 Allergy status to narcotic agent status: Secondary | ICD-10-CM

## 2023-10-24 DIAGNOSIS — Z91199 Patient's noncompliance with other medical treatment and regimen due to unspecified reason: Secondary | ICD-10-CM

## 2023-10-24 DIAGNOSIS — E46 Unspecified protein-calorie malnutrition: Secondary | ICD-10-CM | POA: Diagnosis present

## 2023-10-24 DIAGNOSIS — K219 Gastro-esophageal reflux disease without esophagitis: Secondary | ICD-10-CM | POA: Diagnosis present

## 2023-10-24 DIAGNOSIS — R55 Syncope and collapse: Secondary | ICD-10-CM | POA: Diagnosis present

## 2023-10-24 DIAGNOSIS — E039 Hypothyroidism, unspecified: Secondary | ICD-10-CM | POA: Diagnosis present

## 2023-10-24 DIAGNOSIS — Z96643 Presence of artificial hip joint, bilateral: Secondary | ICD-10-CM | POA: Diagnosis present

## 2023-10-24 DIAGNOSIS — D72823 Leukemoid reaction: Secondary | ICD-10-CM | POA: Diagnosis present

## 2023-10-24 DIAGNOSIS — Z6825 Body mass index (BMI) 25.0-25.9, adult: Secondary | ICD-10-CM

## 2023-10-24 DIAGNOSIS — K7460 Unspecified cirrhosis of liver: Secondary | ICD-10-CM

## 2023-10-24 DIAGNOSIS — K859 Acute pancreatitis without necrosis or infection, unspecified: Secondary | ICD-10-CM | POA: Diagnosis present

## 2023-10-24 DIAGNOSIS — Z87891 Personal history of nicotine dependence: Secondary | ICD-10-CM

## 2023-10-24 DIAGNOSIS — W109XXA Fall (on) (from) unspecified stairs and steps, initial encounter: Secondary | ICD-10-CM | POA: Diagnosis present

## 2023-10-24 DIAGNOSIS — I10 Essential (primary) hypertension: Secondary | ICD-10-CM | POA: Diagnosis present

## 2023-10-24 DIAGNOSIS — E440 Moderate protein-calorie malnutrition: Secondary | ICD-10-CM | POA: Diagnosis present

## 2023-10-24 DIAGNOSIS — Z882 Allergy status to sulfonamides status: Secondary | ICD-10-CM

## 2023-10-24 DIAGNOSIS — R2681 Unsteadiness on feet: Secondary | ICD-10-CM | POA: Diagnosis present

## 2023-10-24 DIAGNOSIS — Z860100 Personal history of colon polyps, unspecified: Secondary | ICD-10-CM

## 2023-10-24 LAB — MAGNESIUM: Magnesium: 2.2 mg/dL (ref 1.7–2.4)

## 2023-10-24 LAB — URINALYSIS, ROUTINE W REFLEX MICROSCOPIC
Bilirubin Urine: NEGATIVE
Glucose, UA: NEGATIVE mg/dL
Hgb urine dipstick: NEGATIVE
Ketones, ur: NEGATIVE mg/dL
Leukocytes,Ua: NEGATIVE
Nitrite: NEGATIVE
Protein, ur: NEGATIVE mg/dL
Specific Gravity, Urine: 1.021 (ref 1.005–1.030)
pH: 5 (ref 5.0–8.0)

## 2023-10-24 LAB — CBC WITH DIFFERENTIAL/PLATELET
Abs Immature Granulocytes: 0.19 10*3/uL — ABNORMAL HIGH (ref 0.00–0.07)
Basophils Absolute: 0.1 10*3/uL (ref 0.0–0.1)
Basophils Relative: 0 %
Eosinophils Absolute: 0.7 10*3/uL — ABNORMAL HIGH (ref 0.0–0.5)
Eosinophils Relative: 3 %
HCT: 31 % — ABNORMAL LOW (ref 39.0–52.0)
Hemoglobin: 9.8 g/dL — ABNORMAL LOW (ref 13.0–17.0)
Immature Granulocytes: 1 %
Lymphocytes Relative: 6 %
Lymphs Abs: 1.5 10*3/uL (ref 0.7–4.0)
MCH: 33.6 pg (ref 26.0–34.0)
MCHC: 31.6 g/dL (ref 30.0–36.0)
MCV: 106.2 fL — ABNORMAL HIGH (ref 80.0–100.0)
Monocytes Absolute: 1.5 10*3/uL — ABNORMAL HIGH (ref 0.1–1.0)
Monocytes Relative: 7 %
Neutro Abs: 18.9 10*3/uL — ABNORMAL HIGH (ref 1.7–7.7)
Neutrophils Relative %: 83 %
Platelets: 153 10*3/uL (ref 150–400)
RBC: 2.92 MIL/uL — ABNORMAL LOW (ref 4.22–5.81)
RDW: 20.3 % — ABNORMAL HIGH (ref 11.5–15.5)
WBC: 22.8 10*3/uL — ABNORMAL HIGH (ref 4.0–10.5)
nRBC: 0 % (ref 0.0–0.2)

## 2023-10-24 LAB — COMPREHENSIVE METABOLIC PANEL
ALT: 12 U/L (ref 0–44)
AST: 23 U/L (ref 15–41)
Albumin: 2.8 g/dL — ABNORMAL LOW (ref 3.5–5.0)
Alkaline Phosphatase: 146 U/L — ABNORMAL HIGH (ref 38–126)
Anion gap: 8 (ref 5–15)
BUN: 9 mg/dL (ref 8–23)
CO2: 26 mmol/L (ref 22–32)
Calcium: 8.5 mg/dL — ABNORMAL LOW (ref 8.9–10.3)
Chloride: 107 mmol/L (ref 98–111)
Creatinine, Ser: 0.56 mg/dL — ABNORMAL LOW (ref 0.61–1.24)
GFR, Estimated: >60 mL/min (ref >60)
Glucose, Bld: 93 mg/dL (ref 70–99)
Potassium: 3.5 mmol/L (ref 3.5–5.1)
Sodium: 141 mmol/L (ref 135–145)
Total Bilirubin: 0.7 mg/dL (ref 0.3–1.2)
Total Protein: 6 g/dL — ABNORMAL LOW (ref 6.5–8.1)

## 2023-10-24 LAB — TYPE AND SCREEN
ABO/RH(D): A POS
Antibody Screen: NEGATIVE

## 2023-10-24 LAB — I-STAT CG4 LACTIC ACID, ED: Lactic Acid, Venous: 0.7 mmol/L (ref 0.5–1.9)

## 2023-10-24 LAB — ETHANOL: Alcohol, Ethyl (B): <10 mg/dL (ref <10)

## 2023-10-24 LAB — TROPONIN I (HIGH SENSITIVITY)
Troponin I (High Sensitivity): 4 ng/L (ref <18)
Troponin I (High Sensitivity): 3 ng/L (ref <18)

## 2023-10-24 LAB — AMMONIA: Ammonia: 25 umol/L (ref 9–35)

## 2023-10-24 LAB — PHOSPHORUS: Phosphorus: 3.6 mg/dL (ref 2.5–4.6)

## 2023-10-24 MED ORDER — SERTRALINE HCL 50 MG PO TABS
50.0000 mg | ORAL_TABLET | Freq: Every day | ORAL | Status: DC
  Administered 2023-10-24 – 2023-10-27 (×4): 50 mg via ORAL
  Filled 2023-10-24 (×4): qty 1

## 2023-10-24 MED ORDER — LEVOTHYROXINE SODIUM 75 MCG PO TABS
175.0000 ug | ORAL_TABLET | Freq: Every day | ORAL | Status: DC
  Administered 2023-10-25 – 2023-10-28 (×4): 175 ug via ORAL
  Filled 2023-10-24 (×4): qty 1

## 2023-10-24 MED ORDER — LORAZEPAM 1 MG PO TABS
1.0000 mg | ORAL_TABLET | ORAL | Status: DC | PRN
  Administered 2023-10-24 – 2023-10-25 (×2): 1 mg via ORAL
  Filled 2023-10-24 (×2): qty 1

## 2023-10-24 MED ORDER — FOLIC ACID 1 MG PO TABS
1.0000 mg | ORAL_TABLET | Freq: Every day | ORAL | Status: DC
  Administered 2023-10-24 – 2023-10-27 (×4): 1 mg via ORAL
  Filled 2023-10-24 (×4): qty 1

## 2023-10-24 MED ORDER — THIAMINE MONONITRATE 100 MG PO TABS
100.0000 mg | ORAL_TABLET | Freq: Every day | ORAL | Status: DC
  Administered 2023-10-24 – 2023-10-27 (×4): 100 mg via ORAL
  Filled 2023-10-24 (×4): qty 1

## 2023-10-24 MED ORDER — THIAMINE HCL 100 MG/ML IJ SOLN: 100.0000 mg | Freq: Every day | INTRAMUSCULAR | Status: DC

## 2023-10-24 MED ORDER — IOHEXOL 300 MG/ML  SOLN
100.0000 mL | Freq: Once | INTRAMUSCULAR | Status: AC | PRN
  Administered 2023-10-24: 100 mL via INTRAVENOUS

## 2023-10-24 MED ORDER — SODIUM CHLORIDE 0.9 % IV BOLUS
1000.0000 mL | Freq: Once | INTRAVENOUS | Status: AC
  Administered 2023-10-24: 1000 mL via INTRAVENOUS

## 2023-10-24 MED ORDER — LORAZEPAM 2 MG/ML IJ SOLN: 1.0000 mg | INTRAMUSCULAR | Status: DC | PRN

## 2023-10-24 MED ORDER — PIPERACILLIN-TAZOBACTAM 3.375 G IVPB 30 MIN
3.3750 g | Freq: Once | INTRAVENOUS | Status: AC
Start: 2023-10-24 — End: 2023-10-24
  Administered 2023-10-24: 3.375 g via INTRAVENOUS
  Filled 2023-10-24: qty 50

## 2023-10-24 MED ORDER — ENOXAPARIN SODIUM 40 MG/0.4ML IJ SOSY
40.0000 mg | PREFILLED_SYRINGE | INTRAMUSCULAR | Status: DC
  Administered 2023-10-25 – 2023-10-27 (×3): 40 mg via SUBCUTANEOUS
  Filled 2023-10-24 (×3): qty 0.4

## 2023-10-24 NOTE — Progress Notes (Signed)
A consult was received from an ED physician for zosyn  per pharmacy dosing.  The patient's profile has been reviewed for ht/wt/allergies/indication/available labs.   A one time order has been placed for 3.375 gm IV x1 over 30 min.  Further antibiotics/pharmacy consults should be ordered by admitting physician if indicated.                       Thank you, Lucia Gaskins 10/24/2023  8:22 PM

## 2023-10-24 NOTE — ED Notes (Signed)
Pt's daughter/POA- Celedonio Miyamoto [phone] would like updates.  Also, she would like to speak to someone about getting the patient placed in an Assisted Living or SNF.  She is concerned he has beginning stages of dementia.

## 2023-10-24 NOTE — H&P (Signed)
History and Physical    Patient: Philip Humphrey:[id number] DOB: 21-Apr-1961 DOA: 10/24/2023 DOS: the patient was seen and examined on 10/24/2023 PCP: Philip Paris, NP  Patient coming from: Home  Chief Complaint:  Chief Complaint  Patient presents with   Dizziness   HPI: Philip Humphrey is a 62 y.o. male with medical history significant of HTN, pancreatitis , alcohol withdrawal, thrombocytopenia, erosive esophagitis, hereditary hemochromatosis, GERD who presents for multiple falls and dizziness.   Pt provides only limited history. After several questions said that he "can't do anymore of this and that he was trying to sleep." Says he fell 4 times today and was dizzy each time he stood up. Had vomiting a few days ago. Reports all this started when some doctor gave him a new medication but could not say what medication or for what reason. Reports last alcoholic drink was in June but was just recently admitted for alcohol withdrawal 2 weeks ago.   He was recently hospitalized from 10/4-10/14 with dizziness/syncopal episodes thought multifactorial in the setting of EtOH use/severe anemia/electrolyte abnormalities/dehydration. Hgb of 5.9 on presentation due to GI bleed and vitamin b12 and folate deficiency requiring 3 units of pRBC transfusion. GI consulted but no endoscopy was performed as there was no overt bleeding and he had prior outpatient endoscopy without any significant findings. He was also treated for alcohol withdrawal with early DTs.    On arrival to ED, he was afebrile, HR in the high 90s, normotensive on room air.   CBC with leukocytosis of 22.8.,  Hemoglobin stable at 9.8, platelet within normal limits.  CMP unremarkable other than mildly elevated alkaline phosphatase of 146.  EtOH of less than 10  UA was negative  CT of the abdomen and pelvis obtained had increased gallbladder wall thickness or edema.  There was also findings of possible acute on chronic  pancreatitis with lipase pending.  Findings on liver also concerning for possible early cirrhosis.  Right upper quadrant ultrasound was obtained to further evaluate gallbladder and gallbladder thickness was found to be nonspecific and could be due to underlying liver disease or fluid overload.  MRI of the brain was negative for any acute process.  MRI of the L-spine showed mild multilevel lumbar spondylosis.  Posterior paraspinal intramuscular edema likely from muscle strain.  Review of Systems: Unable to review all systems due to lack of cooperation from patient. Past Medical History:  Diagnosis Date   Allergy    occ. seasonal   Anxiety    Arthritis    Colon polyp    Condyloma    anal   Depression    GERD (gastroesophageal reflux disease)    History of transfusion    as child   Hypertension    Knee injury    right knee cap w piece broken off- MRI done 02-06-15-pending surgery. 11-18-16 right knee is still painful, both shoulders(limited ROM right).   Knee pain    Neuropathy    bilateral- greater left   Palpitations    normal stress test 10 yrs ago   Pneumonia    Thyroid disease    Thyroid removed unintentionally at age 36- no further thyroid tissue remains-uses daily supplement   Past Surgical History:  Procedure Laterality Date   COLONOSCOPY     COLONOSCOPY N/A 02/15/2015   Procedure: COLONOSCOPY;  Surgeon: Ovidio Kin, MD;  Location: [facility];  Service: General;  Laterality: N/A;   cyst removed     from neck / abdomed  EXAMINATION UNDER ANESTHESIA N/A 02/15/2015   Procedure: EXAM UNDER ANESTHESIA;  Surgeon: Valarie Merino, MD;  Location: [facility];  Service: General;  Laterality: N/A;   HERNIA REPAIR Left    10 yrs ago   KNEE ARTHROSCOPY Left    x1 and meniscal tear    ROTATOR CUFF REPAIR Right    x1   SHOULDER ARTHROSCOPY W/ ROTATOR CUFF REPAIR Left    THYROIDECTOMY  age 51    TOTAL HIP ARTHROPLASTY Left 11/24/2016   Procedure: LEFT TOTAL HIP ARTHROPLASTY ANTERIOR  APPROACH;  Surgeon: [name], MD;  Location: [facility];  Service: Orthopedics;  Laterality: Left;   TOTAL HIP ARTHROPLASTY     Right hip  Dr. Charlann Boxer 08/03/17   TOTAL HIP ARTHROPLASTY Right 08/03/2017   Procedure: RIGHT TOTAL HIP ARTHROPLASTY ANTERIOR APPROACH;  Surgeon: [name], MD;  Location: [facility];  Service: Orthopedics;  Laterality: Right;  70 mins   UMBILICAL HERNIA REPAIR N/A 10/18/2017   Procedure: Umbilical hernia repair  and excision of lipomas;  Surgeon: Luretha Murphy, MD;  Location: [facility];  Service: General;  Laterality: N/A;   WART FULGURATION N/A 02/15/2015   Procedure: REMOVAL OF ANAL TAGS,CONDYLOMA;  Surgeon: Valarie Merino, MD;  Location: [facility];  Service: General;  Laterality: N/A;   WRIST SURGERY     x2- left (repair tendon/ ligament)   Social History:  reports that he has quit smoking. His smoking use included cigars. He has never used smokeless tobacco. He reports current alcohol use. He reports that he does not use drugs.  Allergies  Allergen Reactions   Sulfa Antibiotics Anaphylaxis, Swelling and Other (See Comments)    Mouth and throat swelling   Oxycodone Itching   Nickel Rash and Other (See Comments)    Caused cold sores    Family History  Problem Relation Age of Onset   Heart disease Mother    Cancer Brother        Lung   Lung disease Brother    Stroke Father     Prior to Admission medications   Medication Sig Start Date End Date Taking? Authorizing Provider  bismuth subsalicylate (PEPTO BISMOL) 262 MG/15ML suspension Take 30 mLs by mouth every 6 (six) hours as needed for indigestion (stomach pain).   Yes [provider]  Cyanocobalamin (B-12) 1000 MCG SUBL Place 1,000 mcg under the tongue daily. 10/04/23  Yes Ghimire, Werner Lean, MD  diclofenac Sodium (VOLTAREN) 1 % GEL Apply 1 application topically 2 (two) times daily as needed (pain).   Yes [provider]  diphenhydramine-acetaminophen (TYLENOL PM) 25-500 MG TABS tablet Take  2-3 tablets by mouth at bedtime as needed (for sleep).   Yes [provider]  famotidine (PEPCID) 40 MG tablet Take 1 tablet (40 mg total) by mouth daily as needed for heartburn or indigestion. 10/04/23  Yes Ghimire, Werner Lean, MD  folic acid (FOLVITE) 1 MG tablet Take 1 tablet (1 mg total) by mouth daily. 10/04/23  Yes Ghimire, Werner Lean, MD  gabapentin (NEURONTIN) 300 MG capsule Take 1-2 capsules (300-600 mg total) by mouth 2 (two) times daily. Take 1 capsule (300 mg) in the morning and Take 2 capsules (600 mg) at bedtime 10/04/23  Yes Ghimire, Shanker M, MD  KLOR-CON M20 20 MEQ tablet Take 1 tablet (20 mEq total) by mouth daily. 10/04/23  Yes Ghimire, Werner Lean, MD  Magnesium 400 MG TABS Take 400 mg by mouth daily. 10/04/23  Yes Ghimire, Werner Lean, MD  magnesium  oxide (MAG-OX) 400 (240 Mg) MG tablet Take 1 tablet by mouth daily. 10/04/23  Yes [provider]  MELATONIN PO Take 1 tablet by mouth at bedtime as needed (sleep).   Yes [provider]  midodrine (PROAMATINE) 2.5 MG tablet Take 2 tablets (5 mg total) by mouth 3 (three) times daily. 10/04/23  Yes Ghimire, Werner Lean, MD  nitroGLYCERIN (NITROSTAT) 0.4 MG SL tablet Place 0.4 mg under the tongue every 5 (five) minutes as needed for chest pain.   Yes [provider]  Nystatin (GERHARDT'S BUTT CREAM) CREA Apply 1 Application topically 4 (four) times daily. 10/04/23  Yes Ghimire, Werner Lean, MD  pantoprazole (PROTONIX) 40 MG tablet Take 1 tablet (40 mg total) by mouth 2 (two) times daily. 10/04/23  Yes Ghimire, Werner Lean, MD  sertraline (ZOLOFT) 50 MG tablet Take 1 tablet (50 mg total) by mouth at bedtime. 10/04/23  Yes Ghimire, Werner Lean, MD  thiamine (VITAMIN B-1) 100 MG tablet Take 1 tablet (100 mg total) by mouth daily. 10/04/23  Yes Ghimire, Werner Lean, MD  traZODone (DESYREL) 50 MG tablet Take 1 tablet (50 mg total) by mouth at bedtime. 10/04/23  Yes Ghimire, Werner Lean, MD  Vitamin D, Ergocalciferol,  (DRISDOL) 1.25 MG (50000 UNIT) CAPS capsule Take 1 capsule (50,000 Units total) by mouth every 7 (seven) days. 10/04/23  Yes Ghimire, Werner Lean, MD  SYNTHROID 175 MCG tablet Take 1 tablet (175 mcg total) by mouth daily. NO MORE REFILLS WITHOUT OFFICE VISIT - 2ND NOTICE 10/04/23   Maretta Bees, MD    Physical Exam: Vitals:   10/24/23 1900 10/24/23 2145 10/24/23 2200 10/24/23 2215  BP: 123/73 (!) 160/82 136/89 (!) 142/89  Pulse: 89 92    Resp: 18 (!) 23 (!) 21 20  Temp: 98.5 F (36.9 C)     TempSrc:      SpO2: 97% 99%     Constitutional: NAD, calm, comfortable, drowsy elderly male lying in bed asleep. Required frequent prompting to wake up and answer questions. Later became more uncooperative with questions and only allowed for minimal physical exam  Eyes: lids and conjunctivae normal ENMT: Mucous membranes are moist.  Neck: normal, supple Respiratory: clear to auscultation bilaterally, no wheezing, no crackles. Normal respiratory effort. No accessory muscle use.  Cardiovascular: Regular rate and rhythm, no murmurs / rubs / gallops. No extremity edema.  Abdomen: no tenderness, soft Musculoskeletal: no clubbing / cyanosis. No joint deformity upper and lower extremities.  Normal muscle tone.  Skin: no rashes, lesions, ulcers. No induration Neurologic: CN 2-12 grossly intact. Did not allow for full neurological testing or extremity strength testing Psychiatric: Alert and oriented x 3. Uncooperative mood.   Data Reviewed:  See HPI  Assessment and Plan: * Dizziness -Pt presents with multiple falls with dizziness. Had similar presentation several weeks ago when he was hospitalized. At that time had electrolyte imbalance and severe anemia.  These are stable at this time.  MRI brain and MRI L-spine without any acute findings today on presentation. -Orthostatic vital signs were negative.  Unclear if he is compliant with midodrine. -He denies alcohol use since June although he was just  admitted for alcohol withdrawal with DT.  Highly suspicious that this is likely to his chronic alcohol use. -He did have abnormal TSH in August of around 13.  Will repeat TSH -check B12  Hereditary hemochromatosis (HCC) Follows with oncology and has received intermittent phlebotomy in the past  Alcohol abuse Denies use since June although  was just admitted for alcohol withdrawal with DTs 2 weeks ago -Place on CIWA protocol  Hypothyroidism Continue levothyroxine      Advance Care Planning: Full  Consults: none  Family Communication: none at bedside  Severity of Illness: The appropriate patient status for this patient is OBSERVATION. Observation status is judged to be reasonable and necessary in order to provide the required intensity of service to ensure the patient's safety. The patient's presenting symptoms, physical exam findings, and initial radiographic and laboratory data in the context of their medical condition is felt to place them at decreased risk for further clinical deterioration. Furthermore, it is anticipated that the patient will be medically stable for discharge from the hospital within 2 midnights of admission.   Author: Anselm Jungling, DO 10/24/2023 11:02 PM  For on call review [email or web address].

## 2023-10-24 NOTE — ED Notes (Addendum)
Daughter requests placement in [place] upon discharge if he is admitted.  She would like to be informed of all updates.  Daughter also reports the patient has been experiencing hallucinations at home.

## 2023-10-24 NOTE — ED Triage Notes (Addendum)
Patient here from home reporting ongoing falls. States that he gets dizzy before fall. Difficulty ambulating. Pain to bilateral legs. Hx of vertigo .

## 2023-10-24 NOTE — Assessment & Plan Note (Addendum)
Denies use since June although was just admitted for alcohol withdrawal with DTs 2 weeks ago -Place on CIWA protocol

## 2023-10-24 NOTE — Assessment & Plan Note (Addendum)
-  Pt presents with multiple falls with dizziness. Had similar presentation several weeks ago when he was hospitalized. At that time had electrolyte imbalance and severe anemia.  These are stable at this time.  MRI brain and MRI L-spine without any acute findings today on presentation. -Orthostatic vital signs were negative.  Unclear if he is compliant with midodrine. -He denies alcohol use since June although he was just admitted for alcohol withdrawal with DT.  Highly suspicious that this is likely to his chronic alcohol use. -He did have abnormal TSH in August of around 13.  Will repeat TSH -check B12

## 2023-10-24 NOTE — ED Provider Notes (Signed)
[place] EMERGENCY DEPARTMENT AT [facility] Provider Note   CSN: [id number] Arrival date & time: 10/24/23  0630     History Chief Complaint  Patient presents with   Dizziness    Philip Humphrey is a 62 y.o. male with history of alcohol use, hemochromatosis, hypothyroidism, hypertension, delirium tremens, electrolyte abnormalities presents emerged from today for evaluation after fall.  Patient reports that he was walking out of his house and turning around to close a door behind him.  He reports that whenever he was turning back around to pull the door he felt the room sitting sensation and fell down 2 steps.  He currently has no complaints.  He reports that he did call 911 with a cell phone.  He reports that his daughter wants him to be in a nursing home however he cannot due to his wife being ill and need to stay at home to take care of her and take her to appointments.  He denies any alcohol use since being discharged from the hospital.  Denies any tobacco or illicit drug use.  Denies any chest, belly, extremity, or head pain.  He denies any loss of consciousness and does not think that he passed out.  He denies any chest pain or shortness of breath before the incident happened.  With orientation questions, the patient does appear mildly confused and is only oriented to person and place.  Poor historian.  I have called the patient's daughter over three times during various times of his stay to obtain collateral, but unable to get a hold of her. I have left a HIPAA compliant voicemail, but still no return phone call.   Dizziness      Home Medications Prior to Admission medications   Medication Sig Start Date End Date Taking? Authorizing Provider  bismuth subsalicylate (PEPTO BISMOL) 262 MG/15ML suspension Take 30 mLs by mouth every 6 (six) hours as needed for indigestion (stomach pain).    [provider]  Cyanocobalamin (B-12) 1000 MCG SUBL Place 1,000 mcg under  the tongue daily. 10/04/23   Ghimire, Werner Lean, MD  diclofenac Sodium (VOLTAREN) 1 % GEL Apply 1 application topically 2 (two) times daily as needed (pain).    [provider]  diphenhydramine-acetaminophen (TYLENOL PM) 25-500 MG TABS tablet Take 2-3 tablets by mouth at bedtime as needed (for sleep).    [provider]  famotidine (PEPCID) 40 MG tablet Take 1 tablet (40 mg total) by mouth daily as needed for heartburn or indigestion. 10/04/23   Ghimire, Werner Lean, MD  folic acid (FOLVITE) 1 MG tablet Take 1 tablet (1 mg total) by mouth daily. 10/04/23   Ghimire, Werner Lean, MD  gabapentin (NEURONTIN) 300 MG capsule Take 1-2 capsules (300-600 mg total) by mouth 2 (two) times daily. Take 1 capsule (300 mg) in the morning and Take 2 capsules (600 mg) at bedtime 10/04/23   Ghimire, Shanker M, MD  KLOR-CON M20 20 MEQ tablet Take 1 tablet (20 mEq total) by mouth daily. 10/04/23   Ghimire, Werner Lean, MD  Magnesium 400 MG TABS Take 400 mg by mouth daily. 10/04/23   Ghimire, Werner Lean, MD  MELATONIN PO Take 1 tablet by mouth at bedtime as needed (sleep).    [provider]  midodrine (PROAMATINE) 2.5 MG tablet Take 2 tablets (5 mg total) by mouth 3 (three) times daily. 10/04/23   Ghimire, Werner Lean, MD  nitroGLYCERIN (NITROSTAT) 0.4 MG SL tablet Place 0.4 mg under the tongue  every 5 (five) minutes as needed for chest pain.    [provider]  Nystatin (GERHARDT'S BUTT CREAM) CREA Apply 1 Application topically 4 (four) times daily. 10/04/23   Ghimire, Werner Lean, MD  pantoprazole (PROTONIX) 40 MG tablet Take 1 tablet (40 mg total) by mouth 2 (two) times daily. 10/04/23   Ghimire, Werner Lean, MD  sertraline (ZOLOFT) 50 MG tablet Take 1 tablet (50 mg total) by mouth at bedtime. 10/04/23   Ghimire, Werner Lean, MD  SYNTHROID 175 MCG tablet Take 1 tablet (175 mcg total) by mouth daily. NO MORE REFILLS WITHOUT OFFICE VISIT - 2ND NOTICE 10/04/23   Ghimire, Werner Lean, MD  thiamine  (VITAMIN B-1) 100 MG tablet Take 1 tablet (100 mg total) by mouth daily. 10/04/23   Ghimire, Werner Lean, MD  traZODone (DESYREL) 50 MG tablet Take 1 tablet (50 mg total) by mouth at bedtime. 10/04/23   Ghimire, Werner Lean, MD  Vitamin D, Ergocalciferol, (DRISDOL) 1.25 MG (50000 UNIT) CAPS capsule Take 1 capsule (50,000 Units total) by mouth every 7 (seven) days. 10/04/23   Ghimire, Werner Lean, MD      Allergies    Sulfa antibiotics, Oxycodone, and Nickel    Review of Systems   Review of Systems  Unable to perform ROS: Mental status change    Physical Exam Updated Vital Signs BP 133/88 (BP Location: Right Arm)   Pulse 89   Temp (!) 97.5 F (36.4 C) (Oral)   Resp 16   SpO2 100%  Physical Exam Constitutional:      General: He is not in acute distress.    Appearance: He is not toxic-appearing.  HENT:     Head: Normocephalic and atraumatic.     Nose: Nose normal.     Mouth/Throat:     Mouth: Mucous membranes are dry.  Eyes:     General: No scleral icterus.    Extraocular Movements: Extraocular movements intact.     Pupils: Pupils are equal, round, and reactive to light.  Cardiovascular:     Rate and Rhythm: Normal rate.  Pulmonary:     Effort: Pulmonary effort is normal. No respiratory distress.  Abdominal:     General: There is no distension.     Palpations: Abdomen is soft.     Tenderness: There is abdominal tenderness. There is no guarding or rebound.     Comments: Mild right lower quadrant digital, abdominal patient however no longer has tenderness.  Soft.  No overlying skin changes noted.  Musculoskeletal:        General: No tenderness.     Cervical back: Normal range of motion. No rigidity.     Comments: No midline or paraspinal cervical, thoracic, or lumbar tenderness palpation.  No overlying skin changes or signs of trauma.  Nontender to palpation to the upper and lower bilateral extremities.  Compartments are soft.  Neurovascularly intact distally per patient.  Skin:     General: Skin is warm and dry.  Neurological:     Mental Status: He is alert.     Comments: Oriented to person and place however thinks that it is July 2024.  He is moving all extremities however finds it difficult to participate in a cranial nerve exam.     ED Results / Procedures / Treatments   Labs (all labs ordered are listed, but only abnormal results are displayed) Labs Reviewed  CBC WITH DIFFERENTIAL/PLATELET - Abnormal; Notable for the following components:      Result Value  WBC 22.8 (*)    RBC 2.92 (*)    Hemoglobin 9.8 (*)    HCT 31.0 (*)    MCV 106.2 (*)    RDW 20.3 (*)    Neutro Abs 18.9 (*)    Monocytes Absolute 1.5 (*)    Eosinophils Absolute 0.7 (*)    Abs Immature Granulocytes 0.19 (*)    All other components within normal limits  COMPREHENSIVE METABOLIC PANEL - Abnormal; Notable for the following components:   Creatinine, Ser 0.56 (*)    Calcium 8.5 (*)    Total Protein 6.0 (*)    Albumin 2.8 (*)    Alkaline Phosphatase 146 (*)    All other components within normal limits  CULTURE, BLOOD (ROUTINE X 2)  CULTURE, BLOOD (ROUTINE X 2)  ETHANOL  MAGNESIUM  PHOSPHORUS  LACTIC ACID, PLASMA  LACTIC ACID, PLASMA  URINALYSIS, ROUTINE W REFLEX MICROSCOPIC  TYPE AND SCREEN  TROPONIN I (HIGH SENSITIVITY)  TROPONIN I (HIGH SENSITIVITY)    EKG None  Radiology No results found.  Procedures Procedures   Medications Ordered in ED Medications  LORazepam (ATIVAN) tablet 1-4 mg (1 mg Oral Given 10/24/23 1333)    Or  LORazepam (ATIVAN) injection 1-4 mg ( Intravenous See Alternative 10/24/23 1333)  thiamine (VITAMIN B1) tablet 100 mg (100 mg Oral Given 10/24/23 1333)    Or  thiamine (VITAMIN B1) injection 100 mg ( Intravenous See Alternative 10/24/23 1333)  folic acid (FOLVITE) tablet 1 mg (1 mg Oral Given 10/24/23 1333)    ED Course/ Medical Decision Making/ A&P    Medical Decision Making Amount and/or Complexity of Data Reviewed Labs:  ordered. Radiology: ordered.  Risk OTC drugs. Prescription drug management. Decision regarding hospitalization.   62 y.o. male presents to the ER for evaluation of fall. Differential diagnosis includes but is not limited to trauma. Vital signs unremarkable. Physical exam as noted above.   On previous chart evaluation, the patient was recently admitted in the beginning of October for syncope, anemia, electrolyte abnormalities, and likely GI bleed. The plan was for SNF, however the patient wanted to go home.  He currently has no complaints. I do not see any overt signs of trauma. Will order labs. The patient does have some confusion, thinking it is July 2024. Discussed with my attending given findings of significant leukocytosis and recommends MRI of the head and L-spine.  I independently reviewed and interpreted the patient's labs.  Urinalysis shows amber interpret urine.  Rare bacteria otherwise unremarkable.  CMP shows creatinine 0.56.  Decrease in calcium, total protein, and albumin.  Alk phos at 146.  No other electrolyte or LFT abnormality.  CBC with significant leukocytosis with a white count of 22.8 with a left shift.  Patient also has an increase in monocyte count and eosinophils and absolute immature granulocytes.  Does have anemia however dyspnea and appear to be improved from previous at 9.8.  Ethanol undetectable.  Normal phosphorus and magnesium.  Lactic acid within normal limits.  Troponin flat.  Ammonium within normal limits.  Lipase and TSH in process..  On reevaluation, the patient is lying in the stretcher comfortably however does appear he is lying in his own urine.  I reassessed the patient now that he is in a gown and I still do not see any signs of trauma.  He is now endorsing some tenderness whenever I was palpating up on his abdomen.  It appears to be more of the right lower quadrant however I repressed the  mid abdomen and now he saying it is not tender.  Given I think he has  some baseline confusion as well as the leukocytosis, and good continuous eating his abdomen and pelvis.  CT abdomen pelvis shows 1. Small volume abdominopelvic ascites. Question subtle surface  nodularity of the liver, difficult to exclude early changes of  cirrhosis. The spleen is borderline to slightly enlarged.  2. Mildly enhancing gallbladder wall with haziness about the  gallbladder potentially due to wall thickening or edema. No  calcified stone. Consider correlation with ultrasound  3. Findings consistent with chronic pancreatitis. Mild haziness and  fat stranding about the proximal pancreas, difficult to exclude  acute on chronic pancreatitis. Correlate with appropriate laboratory  values.  4. Aortic atherosclerosis.   Per radiologist's read. Will continue to hand off to my attending to follow up on labs and imaging.   I have called the patient's daughter over three times during various times of his stay, but unable to get a hold of her. I have left a HIPAA compliant voicemail, but still no return phone call.   Will hand off to my attending to follow up on labs and imaging and determine care.   10:19 PM Finally was able to speak the patient's daughter on the phone. [name] reports that he was drinking two handles of Ree Kida upon discharge, but hasn't had any alcohol for the past 1.5-2 weeks. She reports that she doesn't really know what his baseline mentation is given his chronic alcohol abuse/chronic inebriation. Reports that a few days ago he was having vivid hallucinations about dog's ripping throats out, children peeping into his house. [name] mentions that his wife has been dead since 11/06/18. He lives by himself. Hallucinations have been for the past 3 days at least. Confusion has been present for months, and she reports has been with the alcohol, so unsure of his baseline. She reports that he has had thyroid issues in the past. I discussed with her that my attending was taking over his  care, but that he will be admitted to the hospital. She is asking that the patient be discharged to a facility after admission. She reports that she has spoken to [employer] at [facility] who has placement for him in the memory care. She would like him placed here if possible.   Portions of this report may have been transcribed using voice recognition software. Every effort was made to ensure accuracy; however, inadvertent computerized transcription errors may be present.   I discussed this case with my attending physician who cosigned this note including patient's presenting symptoms, physical exam, and planned diagnostics and interventions. Attending physician stated agreement with plan or made changes to plan which were implemented.   Attending physician assessed patient at bedside.  Final Clinical Impression(s) / ED Diagnoses Final diagnoses:  None    Rx / DC Orders ED Discharge Orders     None         Achille Rich, Cordelia Poche 10/24/23 2306/11/06    Bethann Berkshire, MD 10/25/23 1042

## 2023-10-24 NOTE — Assessment & Plan Note (Signed)
Follows with oncology and has received intermittent phlebotomy in the past

## 2023-10-24 NOTE — Assessment & Plan Note (Signed)
Continue levothyroxine 

## 2023-10-25 ENCOUNTER — Other Ambulatory Visit: Payer: Self-pay

## 2023-10-25 ENCOUNTER — Encounter (HOSPITAL_COMMUNITY): Payer: Self-pay | Admitting: Family Medicine

## 2023-10-25 DIAGNOSIS — E46 Unspecified protein-calorie malnutrition: Secondary | ICD-10-CM | POA: Diagnosis present

## 2023-10-25 DIAGNOSIS — Z91048 Other nonmedicinal substance allergy status: Secondary | ICD-10-CM | POA: Diagnosis not present

## 2023-10-25 DIAGNOSIS — R748 Abnormal levels of other serum enzymes: Secondary | ICD-10-CM | POA: Diagnosis present

## 2023-10-25 DIAGNOSIS — M47816 Spondylosis without myelopathy or radiculopathy, lumbar region: Secondary | ICD-10-CM | POA: Diagnosis present

## 2023-10-25 DIAGNOSIS — F1010 Alcohol abuse, uncomplicated: Secondary | ICD-10-CM | POA: Diagnosis present

## 2023-10-25 DIAGNOSIS — R296 Repeated falls: Secondary | ICD-10-CM

## 2023-10-25 DIAGNOSIS — Z8249 Family history of ischemic heart disease and other diseases of the circulatory system: Secondary | ICD-10-CM | POA: Diagnosis not present

## 2023-10-25 DIAGNOSIS — Z882 Allergy status to sulfonamides status: Secondary | ICD-10-CM | POA: Diagnosis not present

## 2023-10-25 DIAGNOSIS — E440 Moderate protein-calorie malnutrition: Secondary | ICD-10-CM | POA: Diagnosis present

## 2023-10-25 DIAGNOSIS — R42 Dizziness and giddiness: Secondary | ICD-10-CM | POA: Diagnosis present

## 2023-10-25 DIAGNOSIS — Z96643 Presence of artificial hip joint, bilateral: Secondary | ICD-10-CM | POA: Diagnosis present

## 2023-10-25 DIAGNOSIS — E890 Postprocedural hypothyroidism: Secondary | ICD-10-CM | POA: Diagnosis present

## 2023-10-25 DIAGNOSIS — R55 Syncope and collapse: Secondary | ICD-10-CM | POA: Diagnosis present

## 2023-10-25 DIAGNOSIS — K859 Acute pancreatitis without necrosis or infection, unspecified: Secondary | ICD-10-CM | POA: Diagnosis present

## 2023-10-25 DIAGNOSIS — Z885 Allergy status to narcotic agent status: Secondary | ICD-10-CM | POA: Diagnosis not present

## 2023-10-25 DIAGNOSIS — Z79890 Hormone replacement therapy (postmenopausal): Secondary | ICD-10-CM | POA: Diagnosis not present

## 2023-10-25 DIAGNOSIS — Z860100 Personal history of colon polyps, unspecified: Secondary | ICD-10-CM | POA: Diagnosis not present

## 2023-10-25 DIAGNOSIS — K219 Gastro-esophageal reflux disease without esophagitis: Secondary | ICD-10-CM | POA: Diagnosis present

## 2023-10-25 DIAGNOSIS — R1010 Upper abdominal pain, unspecified: Secondary | ICD-10-CM

## 2023-10-25 DIAGNOSIS — Z87891 Personal history of nicotine dependence: Secondary | ICD-10-CM | POA: Diagnosis not present

## 2023-10-25 DIAGNOSIS — D72823 Leukemoid reaction: Secondary | ICD-10-CM | POA: Diagnosis present

## 2023-10-25 DIAGNOSIS — I10 Essential (primary) hypertension: Secondary | ICD-10-CM | POA: Diagnosis present

## 2023-10-25 DIAGNOSIS — D72829 Elevated white blood cell count, unspecified: Secondary | ICD-10-CM

## 2023-10-25 DIAGNOSIS — K7460 Unspecified cirrhosis of liver: Secondary | ICD-10-CM

## 2023-10-25 DIAGNOSIS — W109XXA Fall (on) (from) unspecified stairs and steps, initial encounter: Secondary | ICD-10-CM | POA: Diagnosis present

## 2023-10-25 DIAGNOSIS — Z79899 Other long term (current) drug therapy: Secondary | ICD-10-CM | POA: Diagnosis not present

## 2023-10-25 DIAGNOSIS — R2681 Unsteadiness on feet: Secondary | ICD-10-CM | POA: Diagnosis present

## 2023-10-25 LAB — CBC
HCT: 30.3 % — ABNORMAL LOW (ref 39.0–52.0)
Hemoglobin: 9.6 g/dL — ABNORMAL LOW (ref 13.0–17.0)
MCH: 33.6 pg (ref 26.0–34.0)
MCHC: 31.7 g/dL (ref 30.0–36.0)
MCV: 105.9 fL — ABNORMAL HIGH (ref 80.0–100.0)
Platelets: 170 10*3/uL (ref 150–400)
RBC: 2.86 MIL/uL — ABNORMAL LOW (ref 4.22–5.81)
RDW: 19.9 % — ABNORMAL HIGH (ref 11.5–15.5)
WBC: 20.3 10*3/uL — ABNORMAL HIGH (ref 4.0–10.5)
nRBC: 0 % (ref 0.0–0.2)

## 2023-10-25 LAB — FOLATE: Folate: 11.9 ng/mL (ref >5.9)

## 2023-10-25 LAB — VITAMIN B12: Vitamin B-12: 419 pg/mL (ref 180–914)

## 2023-10-25 LAB — LIPASE, BLOOD: Lipase: 34 U/L (ref 11–51)

## 2023-10-25 LAB — TSH: TSH: 22.456 u[IU]/mL — ABNORMAL HIGH (ref 0.350–4.500)

## 2023-10-25 NOTE — Progress Notes (Signed)
PROGRESS NOTE  Conn Trombetta Ritzel    DOB: December 07, 1961, 62 y.o.  CZY:[id number]    Code Status: Full Code   DOA: 10/24/2023   LOS: 0   Brief hospital course  Philip Humphrey is a 62 y.o. male with medical history significant of HTN, pancreatitis, alcohol withdrawal, thrombocytopenia, erosive esophagitis, hereditary hemochromatosis, GERD who presents for multiple falls and dizziness.  Reports last alcoholic drink was in June but was just recently admitted for alcohol withdrawal 2 weeks ago.    He was recently hospitalized from 10/4-10/14 with dizziness/syncopal episodes thought multifactorial in the setting of EtOH use/severe anemia/electrolyte abnormalities/dehydration. Hgb of 5.9 on presentation due to GI bleed and vitamin b12 and folate deficiency requiring 3 units of pRBC transfusion. GI consulted but no endoscopy was performed as there was no overt bleeding and he had prior outpatient endoscopy without any significant findings. He was also treated for alcohol withdrawal with early DTs.    On arrival to ED, he was afebrile, HR in the high 90s, normotensive on room air. CBC with leukocytosis of 22.8.,  Hemoglobin stable at 9.8, platelet within normal limits. CMP unremarkable other than mildly elevated alkaline phosphatase of 146. EtOH of less than 10. UA was negative. Lipase 34.   CT of the abdomen and pelvis-increased gallbladder wall thickness or edema.  There was also findings of possible acute on chronic pancreatitis.  Findings on liver also concerning for possible early cirrhosis. RUQ [place] to further evaluate gallbladder and gallbladder thickness was found to be nonspecific and could be due to underlying liver disease or fluid overload. MRI of the brain was negative for any acute process. MRI of the L-spine showed mild multilevel lumbar spondylosis.  Posterior paraspinal intramuscular edema likely from muscle strain.   They were initially treated with home medications and thiamine supplement  as well as empiric zosyn and ativan.   Patient was admitted to medicine service for further workup and management of acute on chronic dizziness with falls as outlined in detail below.  10/25/23 -improving and stable  Assessment & Plan  Principal Problem:   Dizziness Active Problems:   Hypothyroidism   Alcohol abuse   Hereditary hemochromatosis (HCC)   Fall   Malnutrition (HCC)   Dizziness- no episodes since admission. Chronic condition associated with movement. After extensive conversation with patient, he is likely not taking his thyroid medication effectively and is malnourished. MRI brain and MRI L-spine without any acute findings. Orthostatic vital signs were negative. -He denies alcohol use since June although he was just admitted for alcohol withdrawal with DT.  Highly suspicious that this is likely to his chronic alcohol use. Alcohol level negative on admission.  - PT/OT - TOC consult for possible placement recources  Malnourishment- from chronic alcohol use - folate and thiamine supplement   Hereditary hemochromatosis (HCC) Follows with oncology and has received intermittent phlebotomy in the past. CBC is stable   Alcohol use -Place on CIWA protocol   Hypothyroidism- TSH is more elevated than last admission. Patient is not correctly taking his home medication and therefore is not getting adequate dose of his medication.  - provided education on taking the medication - keep current dose of levothyroxine and can increase if needed after trial period of him taking it properly and TSH is still elevated.  - Continue levothyroxine  Body mass index is 25.35 kg/m.  VTE ppx: enoxaparin (LOVENOX) injection 40 mg Start: 10/25/23 1000  Diet:     Diet   Diet regular Room  service appropriate? Yes; Fluid consistency: Thin   Consultants: None   Subjective 10/25/23    Pt reports feeling much better today. Denies dizziness. Feels unsteady on his feet and not comfortable taking  his meds. He and daughter feel that he can no longer live on his own.    Objective   Vitals:   10/25/23 0530 10/25/23 0558 10/25/23 0600 10/25/23 0649  BP: (!) 140/98 (!) 140/98 (!) 139/100 (!) 145/96  Pulse:  82 84 88  Resp:  18  18  Temp:  97.8 F (36.6 C)  97.6 F (36.4 C)  TempSrc:  Oral  Oral  SpO2:  100% 100% 100%  Weight:    77.9 kg  Height:    5\' 9"  (1.753 m)    Intake/Output Summary (Last 24 hours) at 10/25/2023 1158 Last data filed at 10/25/2023 0710 Gross per 24 hour  Intake 50 ml  Output 150 ml  Net -100 ml   Filed Weights   10/25/23 0649  Weight: 77.9 kg     Physical Exam:  General: awake, alert, NAD HEENT: atraumatic, clear conjunctiva, anicteric sclera, MMM, hard of hearing Respiratory: normal respiratory effort. Cardiovascular: quick capillary refill, normal S1/S2, RRR, no JVD, murmurs Gastrointestinal: soft, NT, ND Nervous: A&O x3. no gross focal neurologic deficits, normal speech Extremities: moves all equally, no edema, normal tone Skin: dry, intact, normal temperature, normal color. No rashes, lesions or ulcers on exposed skin Psychiatry: normal mood, congruent affect  Labs   I have personally reviewed the following labs and imaging studies CBC    Component Value Date/Time   WBC 20.3 (H) 10/25/2023 0604   RBC 2.86 (L) 10/25/2023 0604   HGB 9.6 (L) 10/25/2023 0604   HGB 13.8 08/26/2022 1520   HCT 30.3 (L) 10/25/2023 0604   PLT 170 10/25/2023 0604   PLT 156 08/26/2022 1520   MCV 105.9 (H) 10/25/2023 0604   MCV 100.9 (A) 07/13/2013 1256   MCH 33.6 10/25/2023 0604   MCHC 31.7 10/25/2023 0604   RDW 19.9 (H) 10/25/2023 0604   LYMPHSABS 1.5 10/24/2023 1304   MONOABS 1.5 (H) 10/24/2023 1304   EOSABS 0.7 (H) 10/24/2023 1304   BASOSABS 0.1 10/24/2023 1304      Latest Ref Rng & Units 10/24/2023    1:04 PM 10/03/2023    8:13 AM 10/01/2023    4:13 AM  BMP  Glucose 70 - 99 mg/dL 93  81  86   BUN 8 - 23 mg/dL 9  <5  <5   Creatinine 0.61 - 1.24  mg/dL 0.10  2.72  5.36   Sodium 135 - 145 mmol/L 141  134  135   Potassium 3.5 - 5.1 mmol/L 3.5  3.6  4.0   Chloride 98 - 111 mmol/L 107  102  101   CO2 22 - 32 mmol/L 26  25  25    Calcium 8.9 - 10.3 mg/dL 8.5  8.1  8.3     MR BRAIN WO CONTRAST  Result Date: 10/24/2023 CLINICAL DATA:  Initial evaluation for acute head trauma, mental status change. EXAM: MRI HEAD WITHOUT CONTRAST TECHNIQUE: Multiplanar, multiecho pulse sequences of the brain and surrounding structures were obtained without intravenous contrast. COMPARISON:  Prior study from 09/24/2023. FINDINGS: Brain: Mild age-related cerebral atrophy. Patchy T2/FLAIR hyperintensity involving the periventricular deep white matter both cerebral hemispheres, consistent with chronic small vessel ischemic disease, mild in nature. No evidence for acute or subacute ischemia. Gray-white matter differentiation maintained. No areas of chronic cortical infarction. No  acute or chronic intracranial blood products. No mass lesion, midline shift or mass effect. No hydrocephalus or extra-axial fluid collection. Pituitary gland suprasellar region within normal limits. Vascular: Major intracranial vascular flow voids are maintained. Skull and upper cervical spine: Craniocervical junction within normal limits. Bone marrow signal intensity normal. No scalp soft tissue abnormality. Sinuses/Orbits: Globes and orbital soft tissues within normal limits. Mild mucosal thickening present about the sphenoid ethmoidal sinuses. Small bilateral mastoid effusions noted, of doubtful significance. Visualized nasopharynx unremarkable. Other: None. IMPRESSION: 1. No acute intracranial abnormality. 2. Mild age-related cerebral atrophy with chronic small vessel ischemic disease. Electronically Signed   By: Rise Mu M.D.   On: 10/24/2023 21:49   US Abdomen Limited RUQ (LIVER/GB)  Result Date: 10/24/2023 CLINICAL DATA:  Right upper quadrant pain EXAM: ULTRASOUND ABDOMEN LIMITED  RIGHT UPPER QUADRANT COMPARISON:  CT 10/24/2023 FINDINGS: Gallbladder: No shadowing stones. Increased wall thickness up to 4.2 mm but negative sonographic Murphy. Common bile duct: Diameter: 3.5 mm Liver: Liver appears slightly echogenic. Small cyst in the right hepatic lobe measuring 10 mm. Trace perihepatic free fluid. Portal vein is patent on color Doppler imaging with normal direction of blood flow towards the liver. Other: None. IMPRESSION: 1. Negative for gallstones. Increased gallbladder wall thickness is nonspecific and could be due to underlying liver disease or fluid overload. No gallstones. 2. Slightly echogenic liver parenchyma suggesting steatosis. Trace perihepatic free fluid. Electronically Signed   By: Jasmine Pang M.D.   On: 10/24/2023 21:46   MR LUMBAR SPINE WO CONTRAST  Result Date: 10/24/2023 CLINICAL DATA:  Lumbar radiculopathy, symptoms persist with > 6 wks treatment EXAM: MRI LUMBAR SPINE WITHOUT CONTRAST TECHNIQUE: Multiplanar, multisequence MR imaging of the lumbar spine was performed. No intravenous contrast was administered. COMPARISON:  None Available. FINDINGS: Technical Note: Despite efforts by the technologist and patient, motion artifact is present on today's exam and could not be eliminated. This reduces exam sensitivity and specificity. Segmentation:  Standard. Alignment:  Trace retrolisthesis at L5-S1. Vertebrae:  No fracture, evidence of discitis, or bone lesion. Conus medullaris and cauda equina: Conus extends to the L1 level. Conus and cauda equina appear normal. Paraspinal and other soft tissues: Posterior paraspinal intramuscular edema, more pronounced on the right. Disc levels: T12-L1: Unremarkable. L1-L2: Shallow left foraminal disc protrusion. No foraminal or canal stenosis. L2-L3: Mild annular disc bulge with shallow biforaminal protrusions. No foraminal or canal stenosis. L3-L4: Minimal annular disc bulge.  No foraminal or canal stenosis. L4-L5: Mild annular disc  bulge and endplate spurring. Mild bilateral facet hypertrophy. No canal stenosis. No significant foraminal stenosis. L5-S1: Minimal disc bulge with endplate spurring. Mild facet hypertrophy. No significant foraminal or canal stenosis, although there is some mass effect upon the exited bilateral L5 nerve roots from endplate spurring. IMPRESSION: 1. Motion degraded examination. 2. Mild multilevel lumbar spondylosis, most pronounced at L5-S1. No significant canal or foraminal stenosis at any level. Mild mass effect upon the exited bilateral L5 nerve roots from endplate spurring at L5-S1. 3. Posterior paraspinal intramuscular edema, more pronounced on the right. This may be secondary to muscle strain. Electronically Signed   By: Duanne Guess D.O.   On: 10/24/2023 20:54   CT ABDOMEN PELVIS W CONTRAST  Result Date: 10/24/2023 CLINICAL DATA:  Multiple fall bilateral leg pain EXAM: CT ABDOMEN AND PELVIS WITH CONTRAST TECHNIQUE: Multidetector CT imaging of the abdomen and pelvis was performed using the standard protocol following bolus administration of intravenous contrast. RADIATION DOSE REDUCTION: This exam was performed according to  the departmental dose-optimization program which includes automated exposure control, adjustment of the mA and/or kV according to patient size and/or use of iterative reconstruction technique. CONTRAST:  OMNIPAQUE IOHEXOL 300 MG/ML  SOLN COMPARISON:  CT 07/23/2023, ultrasound 09/25/2023 FINDINGS: Lower chest: Lung bases demonstrate no acute airspace disease or pleural effusion Hepatobiliary: Subcentimeter liver hypodensities too small to further characterize. Mildly enhancing gallbladder wall with haziness about the gallbladder potentially due to wall thickening or edema. No calcified stone. No biliary dilatation. Question subtle surface nodularity of the liver. Pancreas: No ductal dilatation. Some haziness and fat stranding about the proximal pancreas. There are a few  calcifications at the inferior pancreatic head consistent with chronic pancreatitis. Spleen: Spleen is borderline to slightly in the large, measuring 14 cm craniocaudad. Adrenals/Urinary Tract: Adrenal glands are normal. Kidneys show no hydronephrosis. Renal cysts for which no imaging follow-up is recommended. Bladder is unremarkable Stomach/Bowel: Stomach nonenlarged. No dilated small bowel. No acute bowel wall thickening. Negative appendix. Vascular/Lymphatic: Mild aortic atherosclerosis. No aneurysm. No suspicious lymph nodes. Reproductive: Prostate is unremarkable. Other: No free air.  Small volume abdominopelvic ascites Musculoskeletal: Bilateral hip replacements with artifact. No acute osseous abnormality IMPRESSION: 1. Small volume abdominopelvic ascites. Question subtle surface nodularity of the liver, difficult to exclude early changes of cirrhosis. The spleen is borderline to slightly enlarged. 2. Mildly enhancing gallbladder wall with haziness about the gallbladder potentially due to wall thickening or edema. No calcified stone. Consider correlation with ultrasound 3. Findings consistent with chronic pancreatitis. Mild haziness and fat stranding about the proximal pancreas, difficult to exclude acute on chronic pancreatitis. Correlate with appropriate laboratory values. 4. Aortic atherosclerosis. Aortic Atherosclerosis (ICD10-I70.0). Electronically Signed   By: Jasmine Pang M.D.   On: 10/24/2023 19:39   DG Chest 2 View  Result Date: 10/24/2023 CLINICAL DATA:  Weakness.  Ongoing falls EXAM: CHEST - 2 VIEW COMPARISON:  X-ray 09/28/2023 FINDINGS: Underinflation. Mild bandlike changes of the bases. Atelectasis is favored. No pneumothorax, effusion or edema. Normal cardiopericardial silhouette. Air-fluid level along the stomach beneath the left hemidiaphragm. IMPRESSION: Underinflation with basilar atelectasis. Air-fluid level along the stomach beneath the left hemidiaphragm Electronically Signed   By:  Karen Kays M.D.   On: 10/24/2023 16:07    Disposition Plan & Communication  Patient status: Inpatient  Admitted From: Home Planned disposition location: TBD Anticipated discharge date: 11/6 pending placement decision  Family Communication: daughter at bedside    Author: Leeroy Bock, DO [facility] 10/25/2023, 11:58 AM   Available by Epic secure chat 7AM-7PM. If 7PM-7AM, please contact night-coverage.  [facility] contact information found on [email or web address].

## 2023-10-25 NOTE — ED Notes (Signed)
ED TO INPATIENT HANDOFF REPORT  ED Nurse Name and Phone #: Jacqulyn Liner EMTP  S Name/Age/Gender Philip Humphrey 62 y.o. male Room/Bed: WA25/WA25  Code Status   Code Status: Full Code  Home/SNF/Other Home Patient oriented to: self, place, time, and situation Is this baseline? Yes   Triage Complete: Triage complete  Chief Complaint Dizziness [R42]  Triage Note Patient here from home reporting ongoing falls. States that he gets dizzy before fall. Difficulty ambulating. Pain to bilateral legs. Hx of vertigo .    Allergies Allergies  Allergen Reactions   Sulfa Antibiotics Anaphylaxis, Swelling and Other (See Comments)    Mouth and throat swelling   Oxycodone Itching   Nickel Rash and Other (See Comments)    Caused cold sores    Level of Care/Admitting Diagnosis ED Disposition     ED Disposition  Admit   Condition  --   Comment  Hospital Area: [facility]  HOSPITAL [[id number]]  Level of Care: Progressive [102]  Admit to Progressive based on following criteria: MULTISYSTEM THREATS such as stable sepsis, metabolic/electrolyte imbalance with or without encephalopathy that is responding to early treatment.  May place patient in observation at [facility] or [facility] if equivalent level of care is available:: No  Covid Evaluation: Asymptomatic - no recent exposure (last 10 days) testing not required  Diagnosis: Dizziness [[id number]]  Admitting Physician: Anselm Jungling [[id number]]  Attending Physician: Anselm Jungling [[id number]]          B Medical/Surgery History Past Medical History:  Diagnosis Date   Allergy    occ. seasonal   Anxiety    Arthritis    Colon polyp    Condyloma    anal   Depression    GERD (gastroesophageal reflux disease)    History of transfusion    as child   Hypertension    Knee injury    right knee cap w piece broken off- MRI done 02-06-15-pending surgery. 11-18-16 right knee is still painful, both shoulders(limited ROM right).   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-uses daily supplement   Past Surgical History:  Procedure Laterality Date   COLONOSCOPY     COLONOSCOPY N/A 02/15/2015   Procedure: COLONOSCOPY;  Surgeon: Ovidio Kin, MD;  Location: [facility];  Service: General;  Laterality: N/A;   cyst removed     from neck / abdomed   EXAMINATION UNDER ANESTHESIA N/A 02/15/2015   Procedure: EXAM UNDER ANESTHESIA;  Surgeon: Valarie Merino, MD;  Location: [facility];  Service: General;  Laterality: N/A;   HERNIA REPAIR Left    10 yrs ago   KNEE ARTHROSCOPY Left    x1 and meniscal tear    ROTATOR CUFF REPAIR Right    x1   SHOULDER ARTHROSCOPY W/ ROTATOR CUFF REPAIR Left    THYROIDECTOMY  age 89    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 Charlann Boxer 08/03/17   TOTAL HIP ARTHROPLASTY Right 08/03/2017   Procedure: RIGHT TOTAL HIP ARTHROPLASTY ANTERIOR APPROACH;  Surgeon: [name], MD;  Location: [facility];  Service: Orthopedics;  Laterality: Right;  70 mins   UMBILICAL HERNIA REPAIR N/A 10/18/2017   Procedure: Umbilical hernia repair  and excision of lipomas;  Surgeon: Luretha Murphy, MD;  Location: [facility];  Service: General;  Laterality: N/A;   WART FULGURATION N/A 02/15/2015   Procedure: REMOVAL OF ANAL TAGS,CONDYLOMA;  Surgeon: Valarie Merino, MD;  Location: [facility];  Service: General;  Laterality: N/A;   WRIST SURGERY     x2- left (repair tendon/ ligament)     A IV Location/Drains/Wounds Patient Lines/Drains/Airways Status     Active Line/Drains/Airways     Name Placement date Placement time Site Days   Peripheral IV 10/24/23 20 G Left Antecubital 10/24/23   1312  Antecubital  1   Peripheral IV 10/24/23 22 G Right Antecubital 10/24/23  2016  Antecubital  1   Incision - 2 Ports Abdomen Right;Upper Left;Upper 10/18/17  1501  -- 2198            Intake/Output Last 24 hours  Intake/Output Summary (Last 24 hours) at 10/25/2023 0606 Last data filed at 10/24/2023 2139 Gross per 24 hour  Intake 50 ml  Output --  Net 50 ml    Labs/Imaging Results for orders placed or performed during the hospital encounter of 10/24/23 (from the past 48 hour(s))  Troponin I (High Sensitivity)     Status: None   Collection Time: 10/24/23  1:04 PM  Result Value Ref Range   Troponin I (High Sensitivity) 4 <18 ng/L    Comment: (NOTE) Elevated high sensitivity troponin I (hsTnI) values and significant  changes across serial measurements may suggest ACS but many other  chronic and acute conditions are known to elevate hsTnI results.  Refer to the "Links" section for chest pain algorithms and additional  guidance. Performed at [facility], [address], [place], [place] [address]   CBC with Differential     Status: Abnormal   Collection Time: 10/24/23  1:04 PM  Result Value Ref Range   WBC 22.8 (H) 4.0 - 10.5 K/uL   RBC 2.92 (L) 4.22 - 5.81 MIL/uL   Hemoglobin 9.8 (L) 13.0 - 17.0 g/dL   HCT 40.9 (L) 81.1 - 91.4 %   MCV 106.2 (H) 80.0 - 100.0 fL   MCH 33.6 26.0 - 34.0 pg   MCHC 31.6 30.0 - 36.0 g/dL   RDW 78.2 (H) 95.6 - 21.3 %   Platelets 153 150 - 400 K/uL   nRBC 0.0 0.0 - 0.2 %   Neutrophils Relative % 83 %   Neutro Abs 18.9 (H) 1.7 - 7.7 K/uL   Lymphocytes Relative 6 %   Lymphs Abs 1.5 0.7 - 4.0 K/uL   Monocytes Relative 7 %   Monocytes Absolute 1.5 (H) 0.1 - 1.0 K/uL   Eosinophils Relative 3 %   Eosinophils Absolute 0.7 (H) 0.0 - 0.5 K/uL   Basophils Relative 0 %   Basophils Absolute 0.1 0.0 - 0.1 K/uL   Immature Granulocytes 1 %   Abs Immature Granulocytes 0.19 (H) 0.00 - 0.07 K/uL    Comment: Performed at [facility], [address] [place], [place] [address]  Comprehensive metabolic panel     Status: Abnormal   Collection Time: 10/24/23  1:04 PM  Result Value Ref Range   Sodium 141 135 - 145 mmol/L   Potassium 3.5 3.5 - 5.1 mmol/L   Chloride 107 98 - 111 mmol/L   CO2 26 22 - 32 mmol/L  Glucose, Bld 93 70 - 99 mg/dL    Comment: Glucose reference range applies only to samples taken after fasting for at least 8 hours.   BUN 9 8 - 23 mg/dL   Creatinine, Ser 1.32 (L) 0.61 - 1.24 mg/dL   Calcium 8.5 (L) 8.9 - 10.3 mg/dL   Total Protein 6.0 (L) 6.5 - 8.1 g/dL   Albumin 2.8 (L) 3.5 - 5.0 g/dL   AST 23 15 - 41 U/L   ALT 12 0 - 44 U/L   Alkaline Phosphatase 146 (H) 38 - 126 U/L   Total Bilirubin 0.7 0.3 - 1.2 mg/dL   GFR, Estimated >44 >01 mL/min    Comment: (NOTE) Calculated using the CKD-EPI Creatinine Equation (2021)    Anion gap 8 5 - 15    Comment: Performed at [facility], [address], [place], [place] [address]  Ethanol     Status: None   Collection Time: 10/24/23  1:04 PM  Result Value Ref Range   Alcohol, Ethyl (B) <10 <10 mg/dL    Comment: (NOTE) Lowest detectable limit for serum alcohol is 10 mg/dL.  For medical purposes only. Performed at [facility], [address] [place], [place] [address]   Magnesium     Status: None   Collection Time: 10/24/23  1:04 PM  Result Value Ref Range   Magnesium 2.2 1.7 - 2.4 mg/dL    Comment: Performed at [facility], [address] [place], [place] [address]  Phosphorus     Status: None   Collection Time: 10/24/23  1:04 PM  Result Value Ref Range   Phosphorus 3.6 2.5 - 4.6 mg/dL    Comment: Performed at [facility], [address] [place], [place], [place] [address]  Type and screen [facility]     Status: None   Collection Time: 10/24/23  1:04 PM  Result Value Ref Range   ABO/RH(D) A POS    Antibody Screen NEG    Sample  Expiration      10/27/2023,2359 Performed at [facility], [address] [place], [place] [address]   Urinalysis, Routine w reflex microscopic -Urine, Clean Catch     Status: Abnormal   Collection Time: 10/24/23  3:05 PM  Result Value Ref Range   Color, Urine AMBER (A) YELLOW    Comment: BIOCHEMICALS MAY BE AFFECTED BY COLOR   APPearance TURBID (A) CLEAR   Specific Gravity, Urine 1.021 1.005 - 1.030   pH 5.0 5.0 - 8.0   Glucose, UA NEGATIVE NEGATIVE mg/dL   Hgb urine dipstick NEGATIVE NEGATIVE   Bilirubin Urine NEGATIVE NEGATIVE   Ketones, ur NEGATIVE NEGATIVE mg/dL   Protein, ur NEGATIVE NEGATIVE mg/dL   Nitrite NEGATIVE NEGATIVE   Leukocytes,Ua NEGATIVE NEGATIVE   RBC / HPF 0-5 0 - 5 RBC/hpf   WBC, UA 0-5 0 - 5 WBC/hpf   Bacteria, UA RARE (A) NONE SEEN   Squamous Epithelial / HPF 0-5 0 - 5 /HPF   Mucus PRESENT     Comment: Performed at [facility], [address], [place], [place] [address]  I-Stat CG4 Lactic Acid     Status: None   Collection Time: 10/24/23  3:33 PM  Result Value Ref Range   Lactic Acid, Venous 0.7 0.5 - 1.9 mmol/L  Troponin I (High Sensitivity)     Status: None   Collection Time: 10/24/23  4:20 PM  Result Value Ref Range  Troponin I (High Sensitivity) 3 <18 ng/L    Comment: (NOTE) Elevated high sensitivity troponin I (hsTnI) values and significant  changes across serial measurements may suggest ACS but many other  chronic and acute conditions are known to elevate hsTnI results.  Refer to the "Links" section for chest pain algorithms and additional  guidance. Performed at [facility], [address], [place], [place] [address]   Ammonia     Status: None   Collection Time: 10/24/23  8:52 PM  Result Value Ref Range   Ammonia 25 9 - 35 umol/L    Comment: Performed at [facility], [address] [place], [place] [address]  Lipase, blood     Status: None   Collection Time:  10/24/23  8:52 PM  Result Value Ref Range   Lipase 34 11 - 51 U/L    Comment: Performed at [facility], [address], [place], [place] [address]  TSH     Status: Abnormal   Collection Time: 10/24/23 10:27 PM  Result Value Ref Range   TSH 22.456 (H) 0.350 - 4.500 uIU/mL    Comment: Performed by a 3rd Generation assay with a functional sensitivity of <=0.01 uIU/mL. Performed at [facility], [address], [place], [place] [address]    MR BRAIN WO CONTRAST  Result Date: 10/24/2023 CLINICAL DATA:  Initial evaluation for acute head trauma, mental status change. EXAM: MRI HEAD WITHOUT CONTRAST TECHNIQUE: Multiplanar, multiecho pulse sequences of the brain and surrounding structures were obtained without intravenous contrast. COMPARISON:  Prior study from 09/24/2023. FINDINGS: Brain: Mild age-related cerebral atrophy. Patchy T2/FLAIR hyperintensity involving the periventricular deep white matter both cerebral hemispheres, consistent with chronic small vessel ischemic disease, mild in nature. No evidence for acute or subacute ischemia. Gray-white matter differentiation maintained. No areas of chronic cortical infarction. No acute or chronic intracranial blood products. No mass lesion, midline shift or mass effect. No hydrocephalus or extra-axial fluid collection. Pituitary gland suprasellar region within normal limits. Vascular: Major intracranial vascular flow voids are maintained. Skull and upper cervical spine: Craniocervical junction within normal limits. Bone marrow signal intensity normal. No scalp soft tissue abnormality. Sinuses/Orbits: Globes and orbital soft tissues within normal limits. Mild mucosal thickening present about the sphenoid ethmoidal sinuses. Small bilateral mastoid effusions noted, of doubtful significance. Visualized nasopharynx unremarkable. Other: None. IMPRESSION: 1. No acute intracranial abnormality. 2. Mild age-related cerebral atrophy  with chronic small vessel ischemic disease. Electronically Signed   By: Rise Mu M.D.   On: 10/24/2023 21:49   US Abdomen Limited RUQ (LIVER/GB)  Result Date: 10/24/2023 CLINICAL DATA:  Right upper quadrant pain EXAM: ULTRASOUND ABDOMEN LIMITED RIGHT UPPER QUADRANT COMPARISON:  CT 10/24/2023 FINDINGS: Gallbladder: No shadowing stones. Increased wall thickness up to 4.2 mm but negative sonographic Murphy. Common bile duct: Diameter: 3.5 mm Liver: Liver appears slightly echogenic. Small cyst in the right hepatic lobe measuring 10 mm. Trace perihepatic free fluid. Portal vein is patent on color Doppler imaging with normal direction of blood flow towards the liver. Other: None. IMPRESSION: 1. Negative for gallstones. Increased gallbladder wall thickness is nonspecific and could be due to underlying liver disease or fluid overload. No gallstones. 2. Slightly echogenic liver parenchyma suggesting steatosis. Trace perihepatic free fluid. Electronically Signed   By: Jasmine Pang M.D.   On: 10/24/2023 21:46   MR LUMBAR SPINE WO CONTRAST  Result Date: 10/24/2023 CLINICAL DATA:  Lumbar radiculopathy, symptoms persist with > 6 wks treatment EXAM:  MRI LUMBAR SPINE WITHOUT CONTRAST TECHNIQUE: Multiplanar, multisequence MR imaging of the lumbar spine was performed. No intravenous contrast was administered. COMPARISON:  None Available. FINDINGS: Technical Note: Despite efforts by the technologist and patient, motion artifact is present on today's exam and could not be eliminated. This reduces exam sensitivity and specificity. Segmentation:  Standard. Alignment:  Trace retrolisthesis at L5-S1. Vertebrae:  No fracture, evidence of discitis, or bone lesion. Conus medullaris and cauda equina: Conus extends to the L1 level. Conus and cauda equina appear normal. Paraspinal and other soft tissues: Posterior paraspinal intramuscular edema, more pronounced on the right. Disc levels: T12-L1: Unremarkable. L1-L2: Shallow  left foraminal disc protrusion. No foraminal or canal stenosis. L2-L3: Mild annular disc bulge with shallow biforaminal protrusions. No foraminal or canal stenosis. L3-L4: Minimal annular disc bulge.  No foraminal or canal stenosis. L4-L5: Mild annular disc bulge and endplate spurring. Mild bilateral facet hypertrophy. No canal stenosis. No significant foraminal stenosis. L5-S1: Minimal disc bulge with endplate spurring. Mild facet hypertrophy. No significant foraminal or canal stenosis, although there is some mass effect upon the exited bilateral L5 nerve roots from endplate spurring. IMPRESSION: 1. Motion degraded examination. 2. Mild multilevel lumbar spondylosis, most pronounced at L5-S1. No significant canal or foraminal stenosis at any level. Mild mass effect upon the exited bilateral L5 nerve roots from endplate spurring at L5-S1. 3. Posterior paraspinal intramuscular edema, more pronounced on the right. This may be secondary to muscle strain. Electronically Signed   By: Duanne Guess D.O.   On: 10/24/2023 20:54   CT ABDOMEN PELVIS W CONTRAST  Result Date: 10/24/2023 CLINICAL DATA:  Multiple fall bilateral leg pain EXAM: CT ABDOMEN AND PELVIS WITH CONTRAST TECHNIQUE: Multidetector CT imaging of the abdomen and pelvis was performed using the standard protocol following bolus administration of intravenous contrast. RADIATION DOSE REDUCTION: This exam was performed according to the departmental dose-optimization program which includes automated exposure control, adjustment of the mA and/or kV according to patient size and/or use of iterative reconstruction technique. CONTRAST:  OMNIPAQUE IOHEXOL 300 MG/ML  SOLN COMPARISON:  CT 07/23/2023, ultrasound 09/25/2023 FINDINGS: Lower chest: Lung bases demonstrate no acute airspace disease or pleural effusion Hepatobiliary: Subcentimeter liver hypodensities too small to further characterize. Mildly enhancing gallbladder wall with haziness about the  gallbladder potentially due to wall thickening or edema. No calcified stone. No biliary dilatation. Question subtle surface nodularity of the liver. Pancreas: No ductal dilatation. Some haziness and fat stranding about the proximal pancreas. There are a few calcifications at the inferior pancreatic head consistent with chronic pancreatitis. Spleen: Spleen is borderline to slightly in the large, measuring 14 cm craniocaudad. Adrenals/Urinary Tract: Adrenal glands are normal. Kidneys show no hydronephrosis. Renal cysts for which no imaging follow-up is recommended. Bladder is unremarkable Stomach/Bowel: Stomach nonenlarged. No dilated small bowel. No acute bowel wall thickening. Negative appendix. Vascular/Lymphatic: Mild aortic atherosclerosis. No aneurysm. No suspicious lymph nodes. Reproductive: Prostate is unremarkable. Other: No free air.  Small volume abdominopelvic ascites Musculoskeletal: Bilateral hip replacements with artifact. No acute osseous abnormality IMPRESSION: 1. Small volume abdominopelvic ascites. Question subtle surface nodularity of the liver, difficult to exclude early changes of cirrhosis. The spleen is borderline to slightly enlarged. 2. Mildly enhancing gallbladder wall with haziness about the gallbladder potentially due to wall thickening or edema. No calcified stone. Consider correlation with ultrasound 3. Findings consistent with chronic pancreatitis. Mild haziness and fat stranding about the proximal pancreas, difficult to exclude acute on chronic pancreatitis. Correlate with appropriate laboratory values. 4.  Aortic atherosclerosis. Aortic Atherosclerosis (ICD10-I70.0). Electronically Signed   By: Jasmine Pang M.D.   On: 10/24/2023 19:39   DG Chest 2 View  Result Date: 10/24/2023 CLINICAL DATA:  Weakness.  Ongoing falls EXAM: CHEST - 2 VIEW COMPARISON:  X-ray 09/28/2023 FINDINGS: Underinflation. Mild bandlike changes of the bases. Atelectasis is favored. No pneumothorax, effusion or  edema. Normal cardiopericardial silhouette. Air-fluid level along the stomach beneath the left hemidiaphragm. IMPRESSION: Underinflation with basilar atelectasis. Air-fluid level along the stomach beneath the left hemidiaphragm Electronically Signed   By: Karen Kays M.D.   On: 10/24/2023 16:07    Pending Labs Unresulted Labs (From admission, onward)     Start     Ordered   10/25/23 0500  CBC  Tomorrow morning,   R        10/24/23 2250   10/25/23 0500  Vitamin B12  Tomorrow morning,   R        10/24/23 2300   10/24/23 1347  Blood culture (routine x 2)  BLOOD CULTURE X 2,   R (with STAT occurrences)      10/24/23 1346            Vitals/Pain Today's Vitals   10/25/23 0145 10/25/23 0300 10/25/23 0530 10/25/23 0558  BP: 117/88 124/75 (!) 140/98 (!) 140/98  Pulse: 90 90  82  Resp: 15   18  Temp:    97.8 F (36.6 C)  TempSrc:    Oral  SpO2: 97%   100%  PainSc:        Isolation Precautions No active isolations  Medications Medications  LORazepam (ATIVAN) tablet 1-4 mg (1 mg Oral Given 10/24/23 1333)    Or  LORazepam (ATIVAN) injection 1-4 mg ( Intravenous See Alternative 10/24/23 1333)  thiamine (VITAMIN B1) tablet 100 mg (100 mg Oral Given 10/24/23 1333)    Or  thiamine (VITAMIN B1) injection 100 mg ( Intravenous See Alternative 10/24/23 1333)  folic acid (FOLVITE) tablet 1 mg (1 mg Oral Given 10/24/23 1333)  enoxaparin (LOVENOX) injection 40 mg (has no administration in time range)  sertraline (ZOLOFT) tablet 50 mg (50 mg Oral Given by Other 10/24/23 2351)  levothyroxine (SYNTHROID) tablet 175 mcg (175 mcg Oral Given by Other 10/25/23 0555)  iohexol (OMNIPAQUE) 300 MG/ML solution 100 mL (100 mLs Intravenous Contrast Given 10/24/23 1908)  piperacillin-tazobactam (ZOSYN) IVPB 3.375 g (0 g Intravenous Stopped 10/24/23 2139)  sodium chloride 0.9 % bolus 1,000 mL (0 mLs Intravenous Stopped 10/25/23 0201)    Mobility walks with person assist     Focused Assessments Patient came  in for dizziness. He is currently on CIWA protocol. Patient has been compliant with me in the ED. He can walk with assistance to help with unsteady gait. Patient has been using urinal at bedside when he can. Patient has had a few accidents that required linen changes. Patient is alert and oriented x4. Vitals have been stable. No complaints from patient at this time.    R Recommendations: See Admitting Provider Note  Report given to:   Additional Notes:

## 2023-10-25 NOTE — Progress Notes (Signed)
Mobility Specialist - Progress Note   10/25/23 1122  Mobility  Activity Ambulated with assistance in hallway;Ambulated with assistance to bathroom  Level of Assistance Contact guard assist, steadying assist  Assistive Device Front wheel walker  Distance Ambulated (ft) 150 ft  Range of Motion/Exercises Active  Activity Response Tolerated fair  Mobility Referral Yes  $Mobility charge 1 Mobility  Mobility Specialist Start Time (ACUTE ONLY) 1100  Mobility Specialist Stop Time (ACUTE ONLY) 1122  Mobility Specialist Time Calculation (min) (ACUTE ONLY) 22 min   Pt was found in bed and agreeable to ambulate. Had x1 brief standing rest break during session and then stated needing to use restroom. Assisted pt back to bathroom and at EOS returned to bed with all needs met. Call bell in reach and daughter in room.  Billey Chang Mobility Specialist

## 2023-10-25 NOTE — Plan of Care (Signed)
  Problem: Skin Integrity: Goal: Risk for impaired skin integrity will decrease Outcome: Progressing   Problem: Safety: Goal: Ability to remain free from injury will improve Outcome: Progressing   Problem: Pain Management: Goal: General experience of comfort will improve Outcome: Progressing   Problem: Coping: Goal: Level of anxiety will decrease Outcome: Progressing   Problem: Nutrition: Goal: Adequate nutrition will be maintained Outcome: Progressing   Problem: Activity: Goal: Risk for activity intolerance will decrease Outcome: Progressing   Problem: Clinical Measurements: Goal: Ability to maintain clinical measurements within normal limits will improve Outcome: Progressing   Problem: Education: Goal: Knowledge of General Education information will improve Description: Including pain rating scale, medication(s)/side effects and non-pharmacologic comfort measures Outcome: Progressing

## 2023-10-26 DIAGNOSIS — D72829 Elevated white blood cell count, unspecified: Secondary | ICD-10-CM | POA: Diagnosis not present

## 2023-10-26 DIAGNOSIS — R296 Repeated falls: Secondary | ICD-10-CM | POA: Diagnosis not present

## 2023-10-26 DIAGNOSIS — R42 Dizziness and giddiness: Secondary | ICD-10-CM | POA: Diagnosis not present

## 2023-10-26 DIAGNOSIS — K7460 Unspecified cirrhosis of liver: Secondary | ICD-10-CM | POA: Diagnosis not present

## 2023-10-26 LAB — BLOOD CULTURE ID PANEL (REFLEXED) - BCID2

## 2023-10-26 MED ORDER — NAPHAZOLINE-GLYCERIN 0.012-0.25 % OP SOLN
1.0000 [drp] | Freq: Two times a day (BID) | OPHTHALMIC | Status: DC
  Administered 2023-10-26: 1 [drp] via OPHTHALMIC
  Administered 2023-10-26 – 2023-10-27 (×2): 2 [drp] via OPHTHALMIC
  Administered 2023-10-27: 1 [drp] via OPHTHALMIC
  Filled 2023-10-26: qty 15

## 2023-10-26 NOTE — Evaluation (Signed)
Physical Therapy Evaluation Patient Details Name: Philip Humphrey MRN: [id number] DOB: [birth year] Today's Date: 10/26/2023  History of Present Illness  62 y.o. male with medical history significant of HTN, pancreatitis , alcohol withdrawal, thrombocytopenia, erosive esophagitis, hereditary hemochromatosis, GERD who presents for multiple falls and dizziness.  Clinical Impression  Pt admitted with above diagnosis. Pt from home alone at baseline, ind with self care and using RW for ambulation. Per daughter, pt had [facility] services but pt was too confused to engage effectively. Pt currently needing supv with transfers and limited steps in room using RW. Pt with history of falls and presents with dizziness when changing positions as well as orthostatic BP. Recommend 24/7 supv/assist at d/c; may benefit from ALF if family unable to provide 24/7 support. Pt currently with functional limitations due to the deficits listed below (see PT Problem List). Pt will benefit from acute skilled PT to increase their independence and safety with mobility to allow discharge.           If plan is discharge home, recommend the following: Assistance with cooking/housework;Supervision due to cognitive status;Help with stairs or ramp for entrance;Direct supervision/assist for financial management;Direct supervision/assist for medications management   Can travel by private vehicle   Yes    Equipment Recommendations None recommended by PT  Recommendations for Other Services       Functional Status Assessment Patient has had a recent decline in their functional status and demonstrates the ability to make significant improvements in function in a reasonable and predictable amount of time.     Precautions / Restrictions Precautions Precautions: Fall Precaution Comments: monitor BP Restrictions Weight Bearing Restrictions: No      Mobility  Bed Mobility               General bed mobility  comments: Up with NT upon therapy arrival    Transfers Overall transfer level: Needs assistance Equipment used: Rolling walker (2 wheels) Transfers: Sit to/from Stand, Bed to chair/wheelchair/BSC Sit to Stand: Supervision   Step pivot transfers: Supervision       General transfer comment: No VC required for hand placement with RW management. Decreased RW height to accomodate pt's height.    Ambulation/Gait               General Gait Details: supv for pt to amb from EOB to to recliner ~6 ft, no overt LOB or LE buckling noted  Stairs            Wheelchair Mobility     Tilt Bed    Modified Rankin (Stroke Patients Only)       Balance Overall balance assessment: History of Falls, Needs assistance Sitting-balance support: Feet supported Sitting balance-Leahy Scale: Good Sitting balance - Comments: seated EOB   Standing balance support: During functional activity, Bilateral upper extremity supported, Reliant on assistive device for balance Standing balance-Leahy Scale: Poor                               Pertinent Vitals/Pain Pain Assessment Pain Assessment: No/denies pain    Home Living Family/patient expects to be discharged to:: Private residence Living Arrangements: Alone Available Help at Discharge: Family;Available PRN/intermittently Type of Home: House Home Access: Stairs to enter Entrance Stairs-Rails: Left Entrance Stairs-Number of Steps: 5   Home Layout: One level Home Equipment: Agricultural consultant (2 wheels);Cane - single point;Tub bench;Grab bars - toilet;Grab bars - tub/shower  Prior Function Prior Level of Function : Independent/Modified Independent;History of Falls (last six months)             Mobility Comments: pt reports ambulating with RW in the home and community, multiple falls ADLs Comments: pt reports ind with ADLs/IADLs, drives but daughter took keys, yardwork. Pt recently active with [facility] PT/OT/aide  assisting with bathing- daughter reports due to pt's confusion cancelled [facility]     Extremity/Trunk Assessment   Upper Extremity Assessment Upper Extremity Assessment: Defer to OT evaluation RUE Deficits / Details: A/ROM [facility] 3+/5 overall in shoulder, elbow all ranges. Weak gross grasp. LUE Deficits / Details: A/ROM WFL. 3+/5 overall in shoulder, elbow all ranges. Weak gross grasp.    Lower Extremity Assessment Lower Extremity Assessment: Generalized weakness (AROM WFL, strength 3+/5 throughout, denies numbness/tingling, symmetrical)    Cervical / Trunk Assessment Cervical / Trunk Assessment: Normal  Communication   Communication Communication: No apparent difficulties  Cognition Arousal: Alert Behavior During Therapy: WFL for tasks assessed/performed Overall Cognitive Status: Impaired/Different from baseline Area of Impairment: Orientation                 Orientation Level: Time, Disoriented to             General Comments: pt follows commands, reports month is Sept        General Comments General comments (skin integrity, edema, etc.): BP monitored during session. 108/72 HR 103 seated; 84/59 HR 111 standing, BP increased once seated.    Exercises General Exercises - Lower Extremity Long Arc Quad: AROM, Strengthening, Both, 10 reps, Seated Hip Flexion/Marching: AROM, Strengthening, Both, 10 reps, Seated   Assessment/Plan    PT Assessment Patient needs continued PT services  PT Problem List Decreased strength;Decreased activity tolerance;Decreased balance;Decreased knowledge of use of DME;Decreased safety awareness       PT Treatment Interventions DME instruction;Gait training;Stair training;Functional mobility training;Therapeutic activities;Therapeutic exercise;Balance training;Neuromuscular re-education;Cognitive remediation;Patient/family education    PT Goals (Current goals can be found in the Care Plan section)  Acute Rehab PT Goals Patient Stated  Goal: open to more assistance PT Goal Formulation: With patient/family Time For Goal Achievement: 11/09/23 Potential to Achieve Goals: Good    Frequency Min 1X/week     Co-evaluation PT/OT/SLP Co-Evaluation/Treatment: Yes Reason for Co-Treatment: To address functional/ADL transfers PT goals addressed during session: Mobility/safety with mobility;Proper use of DME;Balance OT goals addressed during session: ADL's and self-care;Strengthening/ROM;Proper use of Adaptive equipment and DME       AM-PAC PT "6 Clicks" Mobility  Outcome Measure Help needed turning from your back to your side while in a flat bed without using bedrails?: A Little Help needed moving from lying on your back to sitting on the side of a flat bed without using bedrails?: A Little Help needed moving to and from a bed to a chair (including a wheelchair)?: A Little Help needed standing up from a chair using your arms (e.g., wheelchair or bedside chair)?: A Little Help needed to walk in hospital room?: A Lot Help needed climbing 3-5 steps with a railing? : A Lot 6 Click Score: 16    End of Session Equipment Utilized During Treatment: Gait belt Activity Tolerance: Patient tolerated treatment well;Treatment limited secondary to medical complications (Comment) (orthostatic BP) Patient left: in chair;with call bell/phone within reach;with chair alarm set;with family/visitor present Nurse Communication: Mobility status PT Visit Diagnosis: Other abnormalities of gait and mobility (R26.89);Muscle weakness (generalized) (M62.81);History of falling (Z91.81)    Time: 1610-9604 PT Time Calculation (min) (  ACUTE ONLY): 33 min   Charges:   PT Evaluation $PT Eval Moderate Complexity: 1 Mod   PT General Charges $$ ACUTE PT VISIT: 1 Visit         Tori Ranier Coach PT, DPT 10/26/23, 12:55 PM

## 2023-10-26 NOTE — Progress Notes (Signed)
PHARMACY - PHYSICIAN COMMUNICATION CRITICAL VALUE ALERT - BLOOD CULTURE IDENTIFICATION (BCID)  Philip Humphrey is an 62 y.o. male who presented to [facility] on 10/24/2023 with a chief complaint of multiple falls/dizziness.  Assessment:  1/4 bottles growing GPC in clusters; BCID identifying as staph epi (no resistance detected)  Name of physician (or Provider) Contacted: Chinita Greenland, NP  Current antibiotics: None  Changes to prescribed antibiotics recommended:  No changes recommended as this is likely a contaminant   Results for orders placed or performed during the hospital encounter of 10/24/23  Blood Culture ID Panel (Reflexed) (Collected: 10/24/2023  3:21 PM)  Result Value Ref Range   Enterococcus faecalis NOT DETECTED NOT DETECTED   Enterococcus Faecium NOT DETECTED NOT DETECTED   Listeria monocytogenes NOT DETECTED NOT DETECTED   Staphylococcus species DETECTED (A) NOT DETECTED   Staphylococcus aureus (BCID) NOT DETECTED NOT DETECTED   Staphylococcus epidermidis DETECTED (A) NOT DETECTED   Staphylococcus lugdunensis NOT DETECTED NOT DETECTED   Streptococcus species NOT DETECTED NOT DETECTED   Streptococcus agalactiae NOT DETECTED NOT DETECTED   Streptococcus pneumoniae NOT DETECTED NOT DETECTED   Streptococcus pyogenes NOT DETECTED NOT DETECTED   A.calcoaceticus-baumannii NOT DETECTED NOT DETECTED   Bacteroides fragilis NOT DETECTED NOT DETECTED   Enterobacterales NOT DETECTED NOT DETECTED   Enterobacter cloacae complex NOT DETECTED NOT DETECTED   Escherichia coli NOT DETECTED NOT DETECTED   Klebsiella aerogenes NOT DETECTED NOT DETECTED   Klebsiella oxytoca NOT DETECTED NOT DETECTED   Klebsiella pneumoniae NOT DETECTED NOT DETECTED   Proteus species NOT DETECTED NOT DETECTED   Salmonella species NOT DETECTED NOT DETECTED   Serratia marcescens NOT DETECTED NOT DETECTED   Haemophilus influenzae NOT DETECTED NOT DETECTED   Neisseria meningitidis NOT DETECTED NOT  DETECTED   Pseudomonas aeruginosa NOT DETECTED NOT DETECTED   Stenotrophomonas maltophilia NOT DETECTED NOT DETECTED   Candida albicans NOT DETECTED NOT DETECTED   Candida auris NOT DETECTED NOT DETECTED   Candida glabrata NOT DETECTED NOT DETECTED   Candida krusei NOT DETECTED NOT DETECTED   Candida parapsilosis NOT DETECTED NOT DETECTED   Candida tropicalis NOT DETECTED NOT DETECTED   Cryptococcus neoformans/gattii NOT DETECTED NOT DETECTED   Methicillin resistance mecA/C NOT DETECTED NOT DETECTED    [name], PharmD Clinical Pharmacist  11/5/20247:33 PM

## 2023-10-26 NOTE — TOC Initial Note (Signed)
Transition of Care [facility]) - Initial/Assessment Note    Patient Details  Name: Philip Humphrey MRN: [id number] Date of Birth: 1961-01-14  [facility]) CM/SW Contact:    Lanier Clam, RN Phone Number: 10/26/2023, 3:48 PM  Clinical Narrative:  spoke to dtr Orpha Bur about d/c plans-she will tour [facility] today-await outcome-informed Orpha Bur this is an independent decision & process(out of pocket cost) we can do fl2 & send to ALF once they contact us. Provided Orpha Bur my tel# to given them once confirmed she wants wants to explore services @ this ALF. PT recc HHPT. Patient in agreement to go. Wears eyeglasses,regular diet,ciwa protocal, continent x2.Has rw,w/c,cane.                 Expected Discharge Plan: Home w Home Health Services Barriers to Discharge: Continued Medical Work up   Patient Goals and CMS Choice Patient states their goals for this hospitalization and ongoing recovery are:: ALF CMS Medicare.gov Compare Post Acute Care list provided to:: Patient Represenative (must comment) (Katy(dtr)) Choice offered to / list presented to : Adult Children [place] ownership interest in [facility].provided to:: Adult Children    Expected Discharge Plan and Services   Discharge Planning Services: CM Consult Post Acute Care Choice: Home Health Living arrangements for the past 2 months: Single Family Home                                      Prior Living Arrangements/Services Living arrangements for the past 2 months: Single Family Home Lives with:: Self Patient language and need for interpreter reviewed:: Yes Do you feel safe going back to the place where you live?: Yes      Need for Family Participation in Patient Care: Yes (Comment) Care giver support system in place?: Yes (comment) Current home services: DME (cane, rw,w/c) Criminal Activity/Legal Involvement Pertinent to Current Situation/Hospitalization: No - Comment as needed  Activities of  Daily Living   ADL Screening (condition at time of admission) Independently performs ADLs?: Yes (appropriate for developmental age) Is the patient deaf or have difficulty hearing?: No Does the patient have difficulty seeing, even when wearing glasses/contacts?: No Does the patient have difficulty concentrating, remembering, or making decisions?: No  Permission Sought/Granted Permission sought to share information with : Case Manager Permission granted to share information with : Yes, Verbal Permission Granted  Share Information with NAME: Case Manager           Emotional Assessment Appearance:: Appears stated age   Affect (typically observed): Accepting Orientation: : Oriented to Self, Oriented to Place, Oriented to  Time, Oriented to Situation Alcohol / Substance Use: Alcohol Use Psych Involvement: No (comment)  Admission diagnosis:  Dizziness [R42] Frequent falls [R29.6] Pain of upper abdomen [R10.10] Leukocytosis, unspecified type [D72.829] Hepatic cirrhosis, unspecified hepatic cirrhosis type, unspecified whether ascites present (HCC) [K74.60] Malnutrition (HCC) [E46] Patient Active Problem List   Diagnosis Date Noted   Malnutrition (HCC) 10/25/2023   Frequent falls 10/25/2023   Hepatic cirrhosis (HCC) 10/25/2023   Pain of upper abdomen 10/25/2023   Anemia due to chronic blood loss 09/27/2023   Alcohol withdrawal delirium, acute, mixed level of activity (HCC) 09/27/2023   Heme positive stool 09/26/2023   Macrocytic anemia 09/24/2023   Postural dizziness 07/24/2023   Dizziness 07/23/2023   Leukocytosis 07/23/2023   Acute on chronic anemia 07/23/2023   Chronic idiopathic thrombocytopenia (HCC) 07/23/2023  Hepatic steatosis 07/23/2023   Vitamin B12 deficiency 07/23/2023   GERD (gastroesophageal reflux disease) 07/23/2023   Hypotensive episode 07/23/2023   Postural dizziness with near syncope 07/23/2023   Hypophosphatemia 07/23/2023   Hypomagnesemia 07/23/2023    Syncope 08/10/2022   Orthostatic hypotension 08/10/2022   Diarrhea 08/10/2022   Chronic alcohol use 08/10/2022   Hyperlipidemia 08/10/2022   Peripheral neuropathy 08/10/2022   Fall 04/12/2022   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   AKI (acute kidney injury) (HCC) 03/03/2022   Hyponatremia 03/03/2022   Hypokalemia 03/03/2022   Acute metabolic acidosis 03/03/2022   Elevated LFTs 03/03/2022   Macrocytosis 03/03/2022   Thrombocytopenia (HCC) 03/03/2022   Hereditary hemochromatosis (HCC) 07/17/2021   Alcohol abuse 07/07/2021   Pancreatitis 07/07/2021   Acute kidney injury (HCC) 07/07/2021   Acute pancreatitis 07/07/2021   Status post total hip replacement, 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 [place] [address] Phone: [phone] Fax: [phone]     Social Determinants of Health (SDOH) Social History: SDOH Screenings   Food Insecurity: No Food Insecurity (10/25/2023)  Housing: Low Risk  (10/25/2023)  Transportation Needs: No Transportation Needs (10/25/2023)  Utilities: Not At Risk (10/25/2023)  Depression (PHQ2-9): Low Risk  (09/15/2022)  Tobacco Use: Medium Risk (10/25/2023)   SDOH Interventions:     Readmission Risk Interventions    07/26/2023   12:50 PM  Readmission Risk Prevention Plan  Transportation Screening Complete  PCP or Specialist Appt within 5-7 Days Complete  Home Care Screening Complete  Medication Review (RN CM) Complete

## 2023-10-26 NOTE — Progress Notes (Signed)
PROGRESS NOTE  Philip Humphrey    DOB: [birth year], 61 y.o.  GNF:[id number]    Code Status: Full Code   DOA: 10/24/2023   LOS: 1   Brief hospital course  Philip Humphrey is a 63 y.o. male with medical history significant of HTN, pancreatitis, alcohol withdrawal, thrombocytopenia, erosive esophagitis, hereditary hemochromatosis, GERD who presents for multiple falls and dizziness.  Reports last alcoholic drink was in June but was just recently admitted for alcohol withdrawal 2 weeks ago.    He was recently hospitalized from 10/4-10/14 with dizziness/syncopal episodes thought multifactorial in the setting of EtOH use/severe anemia/electrolyte abnormalities/dehydration. Hgb of 5.9 on presentation due to GI bleed and vitamin b12 and folate deficiency requiring 3 units of pRBC transfusion. GI consulted but no endoscopy was performed as there was no overt bleeding and he had prior outpatient endoscopy without any significant findings. He was also treated for alcohol withdrawal with early DTs.    On arrival to ED, he was afebrile, HR in the high 90s, normotensive on room air. CBC with leukocytosis of 22.8.,  Hemoglobin stable at 9.8, platelet within normal limits. CMP unremarkable other than mildly elevated alkaline phosphatase of 146. EtOH of less than 10. UA was negative. Lipase 34.   CT of the abdomen and pelvis-increased gallbladder wall thickness or edema.  There was also findings of possible acute on chronic pancreatitis.  Findings on liver also concerning for possible early cirrhosis. RUQ [place] to further evaluate gallbladder and gallbladder thickness was found to be nonspecific and could be due to underlying liver disease or fluid overload. MRI of the brain was negative for any acute process. MRI of the L-spine showed mild multilevel lumbar spondylosis.  Posterior paraspinal intramuscular edema likely from muscle strain.   They were initially treated with home medications and thiamine supplement  as well as empiric zosyn and ativan.   Patient was admitted to medicine service for further workup and management of acute on chronic dizziness with falls as outlined in detail below.  10/26/23 -improving and stable  Assessment & Plan  Principal Problem:   Dizziness Active Problems:   Hypothyroidism   Alcohol abuse   Hereditary hemochromatosis (HCC)   Fall   Malnutrition (HCC)   Frequent falls   Hepatic cirrhosis (HCC)   Pain of upper abdomen   Dizziness- no episodes since admission. Chronic condition associated with movement. After extensive conversation with patient, he is likely not taking his thyroid medication effectively and is malnourished. MRI brain and MRI L-spine without any acute findings. Orthostatic vital signs were negative. -He denies alcohol use since June although he was just admitted for alcohol withdrawal with DT.  Highly suspicious that this is likely to his chronic alcohol use. Alcohol level negative on admission.  - PT/OT - TOC consult for possible placement recources  Malnourishment- from chronic alcohol use - folate and thiamine supplement - thiamine lab still pending   Hereditary hemochromatosis (HCC) Follows with oncology and has received intermittent phlebotomy in the past. CBC is stable   Alcohol use -Place on CIWA protocol   Hypothyroidism- TSH is more elevated than last admission. Patient is not correctly taking his home medication and therefore is not getting adequate dose of his medication.  - provided education on taking the medication - keep current dose of levothyroxine and can increase if needed after trial period of him taking it properly and TSH is still elevated.  - Continue levothyroxine  Body mass index is 25.35 kg/m.  VTE ppx:  enoxaparin (LOVENOX) injection 40 mg Start: 10/25/23 1000  Diet:     Diet   Diet regular Room service appropriate? Yes; Fluid consistency: Thin   Consultants: None   Subjective 10/26/23    Pt  reports feeling well today. No dizziness. Has not yet been evaluated by PT/OT. Appears to have some memory discrepancy as he does not recall a lot of what we talked about yesterday.     Objective   Vitals:   10/25/23 0649 10/25/23 1254 10/25/23 2023 10/26/23 0452  BP: (!) 145/96 136/89 (!) 135/90 (!) 143/98  Pulse: 88 87 93 96  Resp: 18 20 18 20   Temp: 97.6 F (36.4 C) 97.8 F (36.6 C) 98.3 F (36.8 C) 98.6 F (37 C)  TempSrc: Oral Oral Oral Oral  SpO2: 100% 100% 99% 99%  Weight: 77.9 kg     Height: 5\' 9"  (1.753 m)       Intake/Output Summary (Last 24 hours) at 10/26/2023 0716 Last data filed at 10/26/2023 0300 Gross per 24 hour  Intake 120 ml  Output 1650 ml  Net -1530 ml   Filed Weights   10/25/23 0649  Weight: 77.9 kg    Physical Exam:  General: awake, alert, NAD HEENT: atraumatic, clear conjunctiva, anicteric sclera, MMM, hard of hearing Respiratory: normal respiratory effort. Cardiovascular: quick capillary refill, normal S1/S2, RRR, no JVD, murmurs Gastrointestinal: soft, NT, ND Nervous: A&O x3. no gross focal neurologic deficits, normal speech Extremities: moves all equally, no edema, normal tone Skin: dry, intact, normal temperature, normal color. No rashes, lesions or ulcers on exposed skin Psychiatry: normal mood, congruent affect  Labs   I have personally reviewed the following labs and imaging studies CBC    Component Value Date/Time   WBC 20.3 (H) 10/25/2023 0604   RBC 2.86 (L) 10/25/2023 0604   HGB 9.6 (L) 10/25/2023 0604   HGB 13.8 08/26/2022 1520   HCT 30.3 (L) 10/25/2023 0604   PLT 170 10/25/2023 0604   PLT 156 08/26/2022 1520   MCV 105.9 (H) 10/25/2023 0604   MCV 100.9 (A) 07/13/2013 1256   MCH 33.6 10/25/2023 0604   MCHC 31.7 10/25/2023 0604   RDW 19.9 (H) 10/25/2023 0604   LYMPHSABS 1.5 10/24/2023 1304   MONOABS 1.5 (H) 10/24/2023 1304   EOSABS 0.7 (H) 10/24/2023 1304   BASOSABS 0.1 10/24/2023 1304      Latest Ref Rng & Units  10/24/2023    1:04 PM 10/03/2023    8:13 AM 10/01/2023    4:13 AM  BMP  Glucose 70 - 99 mg/dL 93  81  86   BUN 8 - 23 mg/dL 9  <5  <5   Creatinine 0.61 - 1.24 mg/dL 3.66  4.40  3.47   Sodium 135 - 145 mmol/L 141  134  135   Potassium 3.5 - 5.1 mmol/L 3.5  3.6  4.0   Chloride 98 - 111 mmol/L 107  102  101   CO2 22 - 32 mmol/L 26  25  25    Calcium 8.9 - 10.3 mg/dL 8.5  8.1  8.3     MR BRAIN WO CONTRAST  Result Date: 10/24/2023 CLINICAL DATA:  Initial evaluation for acute head trauma, mental status change. EXAM: MRI HEAD WITHOUT CONTRAST TECHNIQUE: Multiplanar, multiecho pulse sequences of the brain and surrounding structures were obtained without intravenous contrast. COMPARISON:  Prior study from 09/24/2023. FINDINGS: Brain: Mild age-related cerebral atrophy. Patchy T2/FLAIR hyperintensity involving the periventricular deep white matter both cerebral hemispheres, consistent with  chronic small vessel ischemic disease, mild in nature. No evidence for acute or subacute ischemia. Gray-white matter differentiation maintained. No areas of chronic cortical infarction. No acute or chronic intracranial blood products. No mass lesion, midline shift or mass effect. No hydrocephalus or extra-axial fluid collection. Pituitary gland suprasellar region within normal limits. Vascular: Major intracranial vascular flow voids are maintained. Skull and upper cervical spine: Craniocervical junction within normal limits. Bone marrow signal intensity normal. No scalp soft tissue abnormality. Sinuses/Orbits: Globes and orbital soft tissues within normal limits. Mild mucosal thickening present about the sphenoid ethmoidal sinuses. Small bilateral mastoid effusions noted, of doubtful significance. Visualized nasopharynx unremarkable. Other: None. IMPRESSION: 1. No acute intracranial abnormality. 2. Mild age-related cerebral atrophy with chronic small vessel ischemic disease. Electronically Signed   By: Rise Mu  M.D.   On: 10/24/2023 21:49   US Abdomen Limited RUQ (LIVER/GB)  Result Date: 10/24/2023 CLINICAL DATA:  Right upper quadrant pain EXAM: ULTRASOUND ABDOMEN LIMITED RIGHT UPPER QUADRANT COMPARISON:  CT 10/24/2023 FINDINGS: Gallbladder: No shadowing stones. Increased wall thickness up to 4.2 mm but negative sonographic Murphy. Common bile duct: Diameter: 3.5 mm Liver: Liver appears slightly echogenic. Small cyst in the right hepatic lobe measuring 10 mm. Trace perihepatic free fluid. Portal vein is patent on color Doppler imaging with normal direction of blood flow towards the liver. Other: None. IMPRESSION: 1. Negative for gallstones. Increased gallbladder wall thickness is nonspecific and could be due to underlying liver disease or fluid overload. No gallstones. 2. Slightly echogenic liver parenchyma suggesting steatosis. Trace perihepatic free fluid. Electronically Signed   By: Jasmine Pang M.D.   On: 10/24/2023 21:46   MR LUMBAR SPINE WO CONTRAST  Result Date: 10/24/2023 CLINICAL DATA:  Lumbar radiculopathy, symptoms persist with > 6 wks treatment EXAM: MRI LUMBAR SPINE WITHOUT CONTRAST TECHNIQUE: Multiplanar, multisequence MR imaging of the lumbar spine was performed. No intravenous contrast was administered. COMPARISON:  None Available. FINDINGS: Technical Note: Despite efforts by the technologist and patient, motion artifact is present on today's exam and could not be eliminated. This reduces exam sensitivity and specificity. Segmentation:  Standard. Alignment:  Trace retrolisthesis at L5-S1. Vertebrae:  No fracture, evidence of discitis, or bone lesion. Conus medullaris and cauda equina: Conus extends to the L1 level. Conus and cauda equina appear normal. Paraspinal and other soft tissues: Posterior paraspinal intramuscular edema, more pronounced on the right. Disc levels: T12-L1: Unremarkable. L1-L2: Shallow left foraminal disc protrusion. No foraminal or canal stenosis. L2-L3: Mild annular disc  bulge with shallow biforaminal protrusions. No foraminal or canal stenosis. L3-L4: Minimal annular disc bulge.  No foraminal or canal stenosis. L4-L5: Mild annular disc bulge and endplate spurring. Mild bilateral facet hypertrophy. No canal stenosis. No significant foraminal stenosis. L5-S1: Minimal disc bulge with endplate spurring. Mild facet hypertrophy. No significant foraminal or canal stenosis, although there is some mass effect upon the exited bilateral L5 nerve roots from endplate spurring. IMPRESSION: 1. Motion degraded examination. 2. Mild multilevel lumbar spondylosis, most pronounced at L5-S1. No significant canal or foraminal stenosis at any level. Mild mass effect upon the exited bilateral L5 nerve roots from endplate spurring at L5-S1. 3. Posterior paraspinal intramuscular edema, more pronounced on the right. This may be secondary to muscle strain. Electronically Signed   By: Duanne Guess D.O.   On: 10/24/2023 20:54   CT ABDOMEN PELVIS W CONTRAST  Result Date: 10/24/2023 CLINICAL DATA:  Multiple fall bilateral leg pain EXAM: CT ABDOMEN AND PELVIS WITH CONTRAST TECHNIQUE: Multidetector CT imaging  of the abdomen and pelvis was performed using the standard protocol following bolus administration of intravenous contrast. RADIATION DOSE REDUCTION: This exam was performed according to the departmental dose-optimization program which includes automated exposure control, adjustment of the mA and/or kV according to patient size and/or use of iterative reconstruction technique. CONTRAST:  OMNIPAQUE IOHEXOL 300 MG/ML  SOLN COMPARISON:  CT 07/23/2023, ultrasound 09/25/2023 FINDINGS: Lower chest: Lung bases demonstrate no acute airspace disease or pleural effusion Hepatobiliary: Subcentimeter liver hypodensities too small to further characterize. Mildly enhancing gallbladder wall with haziness about the gallbladder potentially due to wall thickening or edema. No calcified stone. No biliary  dilatation. Question subtle surface nodularity of the liver. Pancreas: No ductal dilatation. Some haziness and fat stranding about the proximal pancreas. There are a few calcifications at the inferior pancreatic head consistent with chronic pancreatitis. Spleen: Spleen is borderline to slightly in the large, measuring 14 cm craniocaudad. Adrenals/Urinary Tract: Adrenal glands are normal. Kidneys show no hydronephrosis. Renal cysts for which no imaging follow-up is recommended. Bladder is unremarkable Stomach/Bowel: Stomach nonenlarged. No dilated small bowel. No acute bowel wall thickening. Negative appendix. Vascular/Lymphatic: Mild aortic atherosclerosis. No aneurysm. No suspicious lymph nodes. Reproductive: Prostate is unremarkable. Other: No free air.  Small volume abdominopelvic ascites Musculoskeletal: Bilateral hip replacements with artifact. No acute osseous abnormality IMPRESSION: 1. Small volume abdominopelvic ascites. Question subtle surface nodularity of the liver, difficult to exclude early changes of cirrhosis. The spleen is borderline to slightly enlarged. 2. Mildly enhancing gallbladder wall with haziness about the gallbladder potentially due to wall thickening or edema. No calcified stone. Consider correlation with ultrasound 3. Findings consistent with chronic pancreatitis. Mild haziness and fat stranding about the proximal pancreas, difficult to exclude acute on chronic pancreatitis. Correlate with appropriate laboratory values. 4. Aortic atherosclerosis. Aortic Atherosclerosis (ICD10-I70.0). Electronically Signed   By: Jasmine Pang M.D.   On: 10/24/2023 19:39   DG Chest 2 View  Result Date: 10/24/2023 CLINICAL DATA:  Weakness.  Ongoing falls EXAM: CHEST - 2 VIEW COMPARISON:  X-ray 09/28/2023 FINDINGS: Underinflation. Mild bandlike changes of the bases. Atelectasis is favored. No pneumothorax, effusion or edema. Normal cardiopericardial silhouette. Air-fluid level along the stomach beneath  the left hemidiaphragm. IMPRESSION: Underinflation with basilar atelectasis. Air-fluid level along the stomach beneath the left hemidiaphragm Electronically Signed   By: Karen Kays M.D.   On: 10/24/2023 16:07    Disposition Plan & Communication  Patient status: Inpatient  Admitted From: Home Planned disposition location: TBD Anticipated discharge date: 11/7 pending placement decision  Family Communication: daughter at bedside yesterday    Author: Leeroy Bock, DO [facility] 10/26/2023, 7:16 AM   Available by Epic secure chat 7AM-7PM. If 7PM-7AM, please contact night-coverage.  [facility] contact information found on [email or web address].

## 2023-10-26 NOTE — Evaluation (Signed)
Occupational Therapy Evaluation Patient Details Name: Philip Humphrey MRN: [id number] DOB: March 07, 1961 Today's Date: 10/26/2023   History of Present Illness 62 y.o. male with medical history significant of HTN, pancreatitis , alcohol withdrawal, thrombocytopenia, erosive esophagitis, hereditary hemochromatosis, GERD who presents for multiple falls and dizziness.   Clinical Impression   Patient evaluated by Occupational Therapy with no further acute OT needs identified. At baseline, pt lives alone and is Mod I for BADL tasks and functional mobility. He has a history of frequent falls recently due to increased dizziness. During evaluation, pt presented with orthostatic BP with reports of dizziness with positional changes especially quick positional changes. Per chart review, dizziness appears to be connected to history of previous alcohol abuse. Recommend that pt receive 24/7 supervision/assist as needed at discharge. Pt would benefit from ALF if family is unable to provide necessary supervision.  OT is signing off. Thank you for this referral.        If plan is discharge home, recommend the following: A little help with bathing/dressing/bathroom;Direct supervision/assist for medications management    Functional Status Assessment  Patient has had a recent decline in their functional status and demonstrates the ability to make significant improvements in function in a reasonable and predictable amount of time.  Equipment Recommendations  None recommended by OT       Precautions / Restrictions Precautions Precautions: Fall Precaution Comments: monitor BP Restrictions Weight Bearing Restrictions: No      Mobility Bed Mobility               General bed mobility comments: Up with NT upon therapy arrival    Transfers Overall transfer level: Needs assistance Equipment used: Rolling walker (2 wheels) Transfers: Sit to/from Stand, Bed to chair/wheelchair/BSC Sit to Stand:  Supervision     Step pivot transfers: Supervision     General transfer comment: No VC required for hand placement with RW management. Decreased RW height to accomodate pt's height.      ADL either performed or assessed with clinical judgement   ADL     General ADL Comments: Pt able to complete all BADL tasks with Supervision due to orthostatic BP and requires increased time seated when possible.     Vision Baseline Vision/History: 1 Wears glasses Ability to See in Adequate Light: 0 Adequate Patient Visual Report: Blurring of vision (left eye) Vision Assessment?: No apparent visual deficits Additional Comments: Pt wears trifocals. Pt reports recent change in left eye vision and needs updated prescription.     Perception Perception: Not tested       Praxis Praxis: Not tested       Pertinent Vitals/Pain Pain Assessment Pain Assessment: No/denies pain     Extremity/Trunk Assessment Upper Extremity Assessment Upper Extremity Assessment: Defer to OT evaluation RUE Deficits / Details: A/ROM [facility]. 3+/5 overall in shoulder, elbow all ranges. Weak gross grasp. LUE Deficits / Details: A/ROM WFL. 3+/5 overall in shoulder, elbow all ranges. Weak gross grasp.   Lower Extremity Assessment Lower Extremity Assessment: Generalized weakness (AROM WFL, strength 3+/5 throughout, denies numbness/tingling, symmetrical)   Cervical / Trunk Assessment Cervical / Trunk Assessment: Normal   Communication Communication Communication: No apparent difficulties   Cognition Arousal: Alert Behavior During Therapy: WFL for tasks assessed/performed Overall Cognitive Status: Impaired/Different from baseline Area of Impairment: Orientation    Orientation Level: Time, Disoriented to Stated month was Sept.         General Comments  BP monitored during session. 108/72 HR 103 seated; 84/59 HR  111 standing, BP increased once seated.            Home Living Family/patient expects to be discharged  to:: Private residence Living Arrangements: Alone Available Help at Discharge: Family;Available PRN/intermittently Type of Home: House Home Access: Stairs to enter [employer] of Steps: 5 Entrance Stairs-Rails: Left Home Layout: One level     Bathroom Shower/Tub: Chief Strategy Officer: Standard     Home Equipment: Agricultural consultant (2 wheels);Cane - single point;Tub bench;Grab bars - toilet;Grab bars - tub/shower          Prior Functioning/Environment Prior Level of Function : Independent/Modified Independent;History of Falls (last six months)      Mobility Comments: pt reports ambulating with RW in the home and community, multiple falls ADLs Comments: pt reports ind with ADLs/IADLs, drives but daughter took keys, yardwork. Pt recently active with [facility] PT/OT/aide assisting with bathing- daughter reports due to pt's confusion cancelled [facility]        OT Problem List: Decreased strength;Impaired balance (sitting and/or standing)         OT Goals(Current goals can be found in the care plan section) Acute Rehab OT Goals OT Goal Formulation: All assessment and education complete, DC therapy  OT Frequency:  1X visit    Co-evaluation PT/OT/SLP Co-Evaluation/Treatment: Yes Reason for Co-Treatment: To address functional/ADL transfers PT goals addressed during session: Mobility/safety with mobility;Proper use of DME;Balance OT goals addressed during session: ADL's and self-care;Strengthening/ROM;Proper use of Adaptive equipment and DME      AM-PAC OT "6 Clicks" Daily Activity     Outcome Measure Help from another person eating meals?: None Help from another person taking care of personal grooming?: None Help from another person toileting, which includes using toliet, bedpan, or urinal?: A Little Help from another person bathing (including washing, rinsing, drying)?: A Little Help from another person to put on and taking off regular upper body clothing?: A  Little Help from another person to put on and taking off regular lower body clothing?: A Little 6 Click Score: 20   End of Session Equipment Utilized During Treatment: Rolling walker (2 wheels) Nurse Communication: Mobility status;Other (comment) (orthostatic BP)  Activity Tolerance: Patient tolerated treatment well;Treatment limited secondary to medical complications (Comment) (orthostatic BP) Patient left: in chair;with call bell/phone within reach;with chair alarm set;with family/visitor present  OT Visit Diagnosis: Muscle weakness (generalized) (M62.81);History of falling (Z91.81)                Time: 0981-1914 OT Time Calculation (min): 29 min Charges:  OT General Charges $OT Visit: 1 Visit OT Evaluation $OT Eval Moderate Complexity: 1 Mod  [employer], OTR/L,CBIS  Supplemental OT - [facility] and [facility] Secure Chat Preferred    Virl Coble, Charisse March 10/26/2023, 12:50 PM

## 2023-10-27 DIAGNOSIS — R42 Dizziness and giddiness: Secondary | ICD-10-CM | POA: Diagnosis not present

## 2023-10-27 LAB — COMPREHENSIVE METABOLIC PANEL
ALT: 12 U/L (ref 0–44)
AST: 23 U/L (ref 15–41)
Albumin: 2.5 g/dL — ABNORMAL LOW (ref 3.5–5.0)
Alkaline Phosphatase: 143 U/L — ABNORMAL HIGH (ref 38–126)
Anion gap: 7 (ref 5–15)
BUN: 5 mg/dL — ABNORMAL LOW (ref 8–23)
CO2: 24 mmol/L (ref 22–32)
Calcium: 7.9 mg/dL — ABNORMAL LOW (ref 8.9–10.3)
Chloride: 106 mmol/L (ref 98–111)
Creatinine, Ser: 0.49 mg/dL — ABNORMAL LOW (ref 0.61–1.24)
GFR, Estimated: >60 mL/min (ref >60)
Glucose, Bld: 77 mg/dL (ref 70–99)
Potassium: 3.6 mmol/L (ref 3.5–5.1)
Sodium: 137 mmol/L (ref 135–145)
Total Bilirubin: 0.3 mg/dL (ref <1.2)
Total Protein: 5.2 g/dL — ABNORMAL LOW (ref 6.5–8.1)

## 2023-10-27 LAB — CBC
HCT: 30.7 % — ABNORMAL LOW (ref 39.0–52.0)
Hemoglobin: 9.9 g/dL — ABNORMAL LOW (ref 13.0–17.0)
MCH: 34.1 pg — ABNORMAL HIGH (ref 26.0–34.0)
MCHC: 32.2 g/dL (ref 30.0–36.0)
MCV: 105.9 fL — ABNORMAL HIGH (ref 80.0–100.0)
Platelets: 162 10*3/uL (ref 150–400)
RBC: 2.9 MIL/uL — ABNORMAL LOW (ref 4.22–5.81)
RDW: 19.2 % — ABNORMAL HIGH (ref 11.5–15.5)
WBC: 20.8 10*3/uL — ABNORMAL HIGH (ref 4.0–10.5)
nRBC: 0 % (ref 0.0–0.2)

## 2023-10-27 LAB — CULTURE, BLOOD (ROUTINE X 2)

## 2023-10-27 MED ORDER — SERTRALINE HCL 50 MG PO TABS: 50.0000 mg | ORAL_TABLET | Freq: Every day | ORAL | 0 refills | Status: AC

## 2023-10-27 MED ORDER — AMOXICILLIN-POT CLAVULANATE 875-125 MG PO TABS: 1.0000 | ORAL_TABLET | Freq: Two times a day (BID) | ORAL | 0 refills | Status: DC

## 2023-10-27 NOTE — Progress Notes (Signed)
Mobility Specialist - Progress Note   10/27/23 1000  Mobility  Activity Ambulated with assistance in hallway  Level of Assistance Contact guard assist, steadying assist  Assistive Device Front wheel walker  Distance Ambulated (ft) 160 ft  Range of Motion/Exercises Active  Activity Response Tolerated well  Mobility Referral Yes  $Mobility charge 1 Mobility  Mobility Specialist Start Time (ACUTE ONLY) 0941  Mobility Specialist Stop Time (ACUTE ONLY) [id number]  Mobility Specialist Time Calculation (min) (ACUTE ONLY) 11 min   Received in bed and agreed to mobility, had slight dizziness nearing midway point, prompting [place] to return to room where pt returned to bed with all needs met.  Marilynne Halsted Mobility Specialist

## 2023-10-27 NOTE — Plan of Care (Signed)
  Problem: Clinical Measurements: Goal: Will remain free from infection Outcome: Progressing Goal: Cardiovascular complication will be avoided Outcome: Progressing   

## 2023-10-27 NOTE — Discharge Summary (Signed)
Physician Discharge Summary  Philip Humphrey HYQ:[id number] DOB: Mar 12, 1961 DOA: 10/24/2023  PCP: Courtney Paris, NP  Admit date: 10/24/2023 Discharge date: 10/27/2023  Time spent: 44 minutes  Recommendations for Outpatient Follow-up:  Complete 3-day course of Augmentin for nonspecific leukocytosis?  Pancreatitis Needs LFT CBC 1 week Recommend complete abstinence from EtOH Recommend TSH in about 3 weeks Recommend abdominal imaging and referral to GI in about 6 weeks to 8 weeks Please follow B1 Please refer back to [facility] medical for endoscopy colonoscopy to workup as an  Discharge Diagnoses:  MAIN problem for hospitalization   Syncope with chronic dizziness Hereditary hemochromatosis Continued EtOH Noncompliance on medical therapy with hypothyroid meds   Please see below for itemized issues addressed in HOpsital- refer to other progress notes for clarity if needed  Discharge Condition: Guarded overall if continues to drink  Diet recommendation: Regular  Filed Weights   10/25/23 0649  Weight: 77.9 kg    History of present illness:  Syncope Probably secondary to EtOH anemia Recent echo was normal 07/24/2023, so doubtful that this is a valvular abnormality This recurred and he was probably not taking his midodrine as it made him feel "bad" here in the hospital his orthostatics were negative Workup has been performed and he is walking about 150 feet I feel this is mainly secondary to his EtOH-he can probably discharge with close monitoring to ALF  Leukocytosis Growing 1 out of 2 bottles of staph epidermidis-received Zosyn at admission Discussed with patient/daughter-I do not think he is infected but I will place him on Augmentin for 3 days--- the differential could be a pancreatitis causing leukocytosis although he has no pain He had leukocytosis at recent admission as well which may be a leukemoid reaction Facility can repeat CBC in about that timeframe and see where they  are and can extend the course if they feel he needs a full 5 days of therapy I do not think he is infected  Recent probable upper GI bleed Patient was seen by [facility] gastroenterology last month 10/5 and has a remote history of polyps no polyps on last colonoscopy in 2016 There were plans for him to get an EGD and colonoscopy at last admission and this may need given his relative stability  Hereditary hemochromatosis Needs follow-up with oncology in about 1 to 2 months  EtOH Claims compliance with cessation however was hospitalized recently --needs to completely abstain from drinking    Discharge Exam: Vitals:   10/27/23 0459 10/27/23 1322  BP: (!) 145/101 (!) 145/96  Pulse: 88 93  Resp: 14 20  Temp: 98.7 F (37.1 C) 97.6 F (36.4 C)  SpO2: 98% 99%    Subj on day of d/c   well  General Exam on discharge  EOMI NCAT no focal deficit no icterus no pallor No rales rhonchi Abdomen soft no rebound Chest clear  Discharge Instructions   Discharge Instructions     Diet - low sodium heart healthy   Complete by: As directed    Discharge instructions   Complete by: As directed    Finish 3-day short course of Augmentin for nonspecific leukocytosis-some staph was growing in 1 blood culture and I think this is just commensal-repeat CBC in 1 week Ensure compliance on midodrine levothyroxine and Zoloft at time of discharge Ensure that he maintains abstinence from EtOH Will need outpatient LFTs as well as imaging abdomen pelvis in about 1 to 2 months   Increase activity slowly   Complete by: As directed  Allergies as of 10/27/2023       Reactions   Sulfa Antibiotics Anaphylaxis, Swelling, Other (See Comments)   Mouth and throat swelling   Oxycodone Itching   Nickel Rash, Other (See Comments)   Caused cold sores        Medication List     STOP taking these medications    B-12 1000 MCG Subl   bismuth subsalicylate 262 MG/15ML suspension Commonly known as:  PEPTO BISMOL   diclofenac Sodium 1 % Gel Commonly known as: VOLTAREN   diphenhydramine-acetaminophen 25-500 MG Tabs tablet Commonly known as: TYLENOL PM   gabapentin 300 MG capsule Commonly known as: NEURONTIN   Gerhardt's butt cream Crea   Klor-Con M20 20 MEQ tablet Generic drug: potassium chloride SA   Magnesium 400 MG Tabs   magnesium oxide 400 (240 Mg) MG tablet Commonly known as: MAG-OX   traZODone 50 MG tablet Commonly known as: DESYREL       TAKE these medications    amoxicillin-clavulanate 875-125 MG tablet Commonly known as: AUGMENTIN Take 1 tablet by mouth 2 (two) times daily.   famotidine 40 MG tablet Commonly known as: PEPCID Take 1 tablet (40 mg total) by mouth daily as needed for heartburn or indigestion.   folic acid 1 MG tablet Commonly known as: FOLVITE Take 1 tablet (1 mg total) by mouth daily.   MELATONIN PO Take 1 tablet by mouth at bedtime as needed (sleep).   midodrine 2.5 MG tablet Commonly known as: PROAMATINE Take 2 tablets (5 mg total) by mouth 3 (three) times daily.   nitroGLYCERIN 0.4 MG SL tablet Commonly known as: NITROSTAT Place 0.4 mg under the tongue every 5 (five) minutes as needed for chest pain.   pantoprazole 40 MG tablet Commonly known as: PROTONIX Take 1 tablet (40 mg total) by mouth 2 (two) times daily.   sertraline 50 MG tablet Commonly known as: ZOLOFT Take 1 tablet (50 mg total) by mouth at bedtime.   Synthroid 175 MCG tablet Generic drug: levothyroxine Take 1 tablet (175 mcg total) by mouth daily. NO MORE REFILLS WITHOUT OFFICE VISIT - 2ND NOTICE   thiamine 100 MG tablet Commonly known as: Vitamin B-1 Take 1 tablet (100 mg total) by mouth daily.   Vitamin D (Ergocalciferol) 1.25 MG (50000 UNIT) Caps capsule Commonly known as: DRISDOL Take 1 capsule (50,000 Units total) by mouth every 7 (seven) days.       Allergies  Allergen Reactions   Sulfa Antibiotics Anaphylaxis, Swelling and Other (See  Comments)    Mouth and throat swelling   Oxycodone Itching   Nickel Rash and Other (See Comments)    Caused cold sores      The results of significant diagnostics from this hospitalization (including imaging, microbiology, ancillary and laboratory) are listed below for reference.    Significant Diagnostic Studies: MR BRAIN WO CONTRAST  Result Date: 10/24/2023 CLINICAL DATA:  Initial evaluation for acute head trauma, mental status change. EXAM: MRI HEAD WITHOUT CONTRAST TECHNIQUE: Multiplanar, multiecho pulse sequences of the brain and surrounding structures were obtained without intravenous contrast. COMPARISON:  Prior study from 09/24/2023. FINDINGS: Brain: Mild age-related cerebral atrophy. Patchy T2/FLAIR hyperintensity involving the periventricular deep white matter both cerebral hemispheres, consistent with chronic small vessel ischemic disease, mild in nature. No evidence for acute or subacute ischemia. Gray-white matter differentiation maintained. No areas of chronic cortical infarction. No acute or chronic intracranial blood products. No mass lesion, midline shift or mass effect. No hydrocephalus or extra-axial fluid collection.  Pituitary gland suprasellar region within normal limits. Vascular: Major intracranial vascular flow voids are maintained. Skull and upper cervical spine: Craniocervical junction within normal limits. Bone marrow signal intensity normal. No scalp soft tissue abnormality. Sinuses/Orbits: Globes and orbital soft tissues within normal limits. Mild mucosal thickening present about the sphenoid ethmoidal sinuses. Small bilateral mastoid effusions noted, of doubtful significance. Visualized nasopharynx unremarkable. Other: None. IMPRESSION: 1. No acute intracranial abnormality. 2. Mild age-related cerebral atrophy with chronic small vessel ischemic disease. Electronically Signed   By: Rise Mu M.D.   On: 10/24/2023 21:49   US Abdomen Limited RUQ  (LIVER/GB)  Result Date: 10/24/2023 CLINICAL DATA:  Right upper quadrant pain EXAM: ULTRASOUND ABDOMEN LIMITED RIGHT UPPER QUADRANT COMPARISON:  CT 10/24/2023 FINDINGS: Gallbladder: No shadowing stones. Increased wall thickness up to 4.2 mm but negative sonographic Murphy. Common bile duct: Diameter: 3.5 mm Liver: Liver appears slightly echogenic. Small cyst in the right hepatic lobe measuring 10 mm. Trace perihepatic free fluid. Portal vein is patent on color Doppler imaging with normal direction of blood flow towards the liver. Other: None. IMPRESSION: 1. Negative for gallstones. Increased gallbladder wall thickness is nonspecific and could be due to underlying liver disease or fluid overload. No gallstones. 2. Slightly echogenic liver parenchyma suggesting steatosis. Trace perihepatic free fluid. Electronically Signed   By: Jasmine Pang M.D.   On: 10/24/2023 21:46   MR LUMBAR SPINE WO CONTRAST  Result Date: 10/24/2023 CLINICAL DATA:  Lumbar radiculopathy, symptoms persist with > 6 wks treatment EXAM: MRI LUMBAR SPINE WITHOUT CONTRAST TECHNIQUE: Multiplanar, multisequence MR imaging of the lumbar spine was performed. No intravenous contrast was administered. COMPARISON:  None Available. FINDINGS: Technical Note: Despite efforts by the technologist and patient, motion artifact is present on today's exam and could not be eliminated. This reduces exam sensitivity and specificity. Segmentation:  Standard. Alignment:  Trace retrolisthesis at L5-S1. Vertebrae:  No fracture, evidence of discitis, or bone lesion. Conus medullaris and cauda equina: Conus extends to the L1 level. Conus and cauda equina appear normal. Paraspinal and other soft tissues: Posterior paraspinal intramuscular edema, more pronounced on the right. Disc levels: T12-L1: Unremarkable. L1-L2: Shallow left foraminal disc protrusion. No foraminal or canal stenosis. L2-L3: Mild annular disc bulge with shallow biforaminal protrusions. No foraminal or  canal stenosis. L3-L4: Minimal annular disc bulge.  No foraminal or canal stenosis. L4-L5: Mild annular disc bulge and endplate spurring. Mild bilateral facet hypertrophy. No canal stenosis. No significant foraminal stenosis. L5-S1: Minimal disc bulge with endplate spurring. Mild facet hypertrophy. No significant foraminal or canal stenosis, although there is some mass effect upon the exited bilateral L5 nerve roots from endplate spurring. IMPRESSION: 1. Motion degraded examination. 2. Mild multilevel lumbar spondylosis, most pronounced at L5-S1. No significant canal or foraminal stenosis at any level. Mild mass effect upon the exited bilateral L5 nerve roots from endplate spurring at L5-S1. 3. Posterior paraspinal intramuscular edema, more pronounced on the right. This may be secondary to muscle strain. Electronically Signed   By: Duanne Guess D.O.   On: 10/24/2023 20:54   CT ABDOMEN PELVIS W CONTRAST  Result Date: 10/24/2023 CLINICAL DATA:  Multiple fall bilateral leg pain EXAM: CT ABDOMEN AND PELVIS WITH CONTRAST TECHNIQUE: Multidetector CT imaging of the abdomen and pelvis was performed using the standard protocol following bolus administration of intravenous contrast. RADIATION DOSE REDUCTION: This exam was performed according to the departmental dose-optimization program which includes automated exposure control, adjustment of the mA and/or kV according to patient size and/or  use of iterative reconstruction technique. CONTRAST:  OMNIPAQUE IOHEXOL 300 MG/ML  SOLN COMPARISON:  CT 07/23/2023, ultrasound 09/25/2023 FINDINGS: Lower chest: Lung bases demonstrate no acute airspace disease or pleural effusion Hepatobiliary: Subcentimeter liver hypodensities too small to further characterize. Mildly enhancing gallbladder wall with haziness about the gallbladder potentially due to wall thickening or edema. No calcified stone. No biliary dilatation. Question subtle surface nodularity of the liver. Pancreas:  No ductal dilatation. Some haziness and fat stranding about the proximal pancreas. There are a few calcifications at the inferior pancreatic head consistent with chronic pancreatitis. Spleen: Spleen is borderline to slightly in the large, measuring 14 cm craniocaudad. Adrenals/Urinary Tract: Adrenal glands are normal. Kidneys show no hydronephrosis. Renal cysts for which no imaging follow-up is recommended. Bladder is unremarkable Stomach/Bowel: Stomach nonenlarged. No dilated small bowel. No acute bowel wall thickening. Negative appendix. Vascular/Lymphatic: Mild aortic atherosclerosis. No aneurysm. No suspicious lymph nodes. Reproductive: Prostate is unremarkable. Other: No free air.  Small volume abdominopelvic ascites Musculoskeletal: Bilateral hip replacements with artifact. No acute osseous abnormality IMPRESSION: 1. Small volume abdominopelvic ascites. Question subtle surface nodularity of the liver, difficult to exclude early changes of cirrhosis. The spleen is borderline to slightly enlarged. 2. Mildly enhancing gallbladder wall with haziness about the gallbladder potentially due to wall thickening or edema. No calcified stone. Consider correlation with ultrasound 3. Findings consistent with chronic pancreatitis. Mild haziness and fat stranding about the proximal pancreas, difficult to exclude acute on chronic pancreatitis. Correlate with appropriate laboratory values. 4. Aortic atherosclerosis. Aortic Atherosclerosis (ICD10-I70.0). Electronically Signed   By: Jasmine Pang M.D.   On: 10/24/2023 19:39   DG Chest 2 View  Result Date: 10/24/2023 CLINICAL DATA:  Weakness.  Ongoing falls EXAM: CHEST - 2 VIEW COMPARISON:  X-ray 09/28/2023 FINDINGS: Underinflation. Mild bandlike changes of the bases. Atelectasis is favored. No pneumothorax, effusion or edema. Normal cardiopericardial silhouette. Air-fluid level along the stomach beneath the left hemidiaphragm. IMPRESSION: Underinflation with basilar  atelectasis. Air-fluid level along the stomach beneath the left hemidiaphragm Electronically Signed   By: Karen Kays M.D.   On: 10/24/2023 16:07   DG Chest Port 1 View  Result Date: 09/28/2023 CLINICAL DATA:  Leukocytosis EXAM: PORTABLE CHEST 1 VIEW COMPARISON:  09/24/2023 FINDINGS: Low lung volumes with minimal basilar atelectasis. No consolidation or effusion. Normal cardiac size. No pneumothorax IMPRESSION: Low lung volumes with minimal basilar atelectasis. Electronically Signed   By: Jasmine Pang M.D.   On: 09/28/2023 16:36    Microbiology: Recent Results (from the past 240 hour(s))  Blood culture (routine x 2)     Status: Abnormal   Collection Time: 10/24/23  3:21 PM   Specimen: BLOOD  Result Value Ref Range Status   Specimen Description   Final    BLOOD SITE NOT SPECIFIED Performed at [facility], [address], [place], [place] [address]    Special Requests   Final    BOTTLES DRAWN AEROBIC AND ANAEROBIC Blood Culture results may not be optimal due to an excessive volume of blood received in culture bottles Performed at [facility], [address], [place], [place] [address]    Culture  Setup Time   Final    GRAM POSITIVE COCCI IN CLUSTERS ANAEROBIC BOTTLE ONLY CRITICAL RESULT CALLED TO, READ BACK BY AND VERIFIED WITH: PHARMD S. DAVIS [id number] @1925  FH    Culture (A)  Final    STAPHYLOCOCCUS EPIDERMIDIS THE SIGNIFICANCE OF ISOLATING THIS ORGANISM FROM A SINGLE SET OF BLOOD  CULTURES WHEN MULTIPLE SETS ARE DRAWN IS UNCERTAIN. PLEASE NOTIFY THE MICROBIOLOGY DEPARTMENT WITHIN ONE WEEK IF SPECIATION AND SENSITIVITIES ARE REQUIRED. Performed at [facility], [address], [place], [place] [address]    Report Status 10/27/2023 FINAL  Final  Blood Culture ID Panel (Reflexed)     Status: Abnormal   Collection Time: 10/24/23  3:21 PM  Result Value Ref Range Status   Enterococcus faecalis NOT DETECTED NOT DETECTED Final   Enterococcus  Faecium NOT DETECTED NOT DETECTED Final   Listeria monocytogenes NOT DETECTED NOT DETECTED Final   Staphylococcus species DETECTED (A) NOT DETECTED Final    Comment: CRITICAL RESULT CALLED TO, READ BACK BY AND VERIFIED WITH: PHARMD S. DAVIS [id number] @1925  FH    Staphylococcus aureus (BCID) NOT DETECTED NOT DETECTED Final   Staphylococcus epidermidis DETECTED (A) NOT DETECTED Final    Comment: CRITICAL RESULT CALLED TO, READ BACK BY AND VERIFIED WITH: PHARMD S. DAVIS [id number] @1925  FH    Staphylococcus lugdunensis NOT DETECTED NOT DETECTED Final   Streptococcus species NOT DETECTED NOT DETECTED Final   Streptococcus agalactiae NOT DETECTED NOT DETECTED Final   Streptococcus pneumoniae NOT DETECTED NOT DETECTED Final   Streptococcus pyogenes NOT DETECTED NOT DETECTED Final   A.calcoaceticus-baumannii NOT DETECTED NOT DETECTED Final   Bacteroides fragilis NOT DETECTED NOT DETECTED Final   Enterobacterales NOT DETECTED NOT DETECTED Final   Enterobacter cloacae complex NOT DETECTED NOT DETECTED Final   Escherichia coli NOT DETECTED NOT DETECTED Final   Klebsiella aerogenes NOT DETECTED NOT DETECTED Final   Klebsiella oxytoca NOT DETECTED NOT DETECTED Final   Klebsiella pneumoniae NOT DETECTED NOT DETECTED Final   Proteus species NOT DETECTED NOT DETECTED Final   Salmonella species NOT DETECTED NOT DETECTED Final   Serratia marcescens NOT DETECTED NOT DETECTED Final   Haemophilus influenzae NOT DETECTED NOT DETECTED Final   Neisseria meningitidis NOT DETECTED NOT DETECTED Final   Pseudomonas aeruginosa NOT DETECTED NOT DETECTED Final   Stenotrophomonas maltophilia NOT DETECTED NOT DETECTED Final   Candida albicans NOT DETECTED NOT DETECTED Final   Candida auris NOT DETECTED NOT DETECTED Final   Candida glabrata NOT DETECTED NOT DETECTED Final   Candida krusei NOT DETECTED NOT DETECTED Final   Candida parapsilosis NOT DETECTED NOT DETECTED Final   Candida tropicalis NOT DETECTED NOT DETECTED  Final   Cryptococcus neoformans/gattii NOT DETECTED NOT DETECTED Final   Methicillin resistance mecA/C NOT DETECTED NOT DETECTED Final    Comment: Performed at [facility], [address] [place], [place] [address]  Blood culture (routine x 2)     Status: None (Preliminary result)   Collection Time: 10/24/23  4:20 PM   Specimen: BLOOD  Result Value Ref Range Status   Specimen Description   Final    BLOOD RIGHT ANTECUBITAL Performed at [facility], [address] [place], [place] [address]    Special Requests   Final    BOTTLES DRAWN AEROBIC AND ANAEROBIC Blood Culture adequate volume Performed at [facility] [place] [facility], [address] [place], [place] [address]    Culture   Final    NO GROWTH 2 DAYS Performed at [facility], [address] [place], [place] [address]    Report Status PENDING  Incomplete     Labs: Basic Metabolic Panel: Recent Labs  Lab 10/24/23 1304 10/27/23 0453  NA 141 137  K 3.5 3.6  CL 107 106  CO2 26 24  GLUCOSE 93 77  BUN 9 5*  CREATININE 0.56* 0.49*  CALCIUM 8.5* 7.9*  MG 2.2  --   PHOS 3.6  --    Liver Function Tests: Recent Labs  Lab 10/24/23 1304 10/27/23 0453  AST 23 23  ALT 12 12  ALKPHOS 146* 143*  BILITOT 0.7 0.3  PROT 6.0* 5.2*  ALBUMIN 2.8* 2.5*   Recent Labs  Lab 10/24/23 2052  LIPASE 34   Recent Labs  Lab 10/24/23 2052  AMMONIA 25   CBC: Recent Labs  Lab 10/24/23 1304 10/25/23 0604 10/27/23 0453  WBC 22.8* 20.3* 20.8*  NEUTROABS 18.9*  --   --   HGB 9.8* 9.6* 9.9*  HCT 31.0* 30.3* 30.7*  MCV 106.2* 105.9* 105.9*  PLT 153 170 162   Cardiac Enzymes: No results for input(s): "CKTOTAL", "CKMB", "CKMBINDEX", "TROPONINI" in the last 168 hours. BNP: BNP (last 3 results) No results for input(s): "BNP" in the last 8760 hours.  ProBNP (last 3 results) No results for input(s): "PROBNP" in the last 8760 hours.  CBG: No results for input(s): "GLUCAP" in the last 168  hours.     Signed:  Rhetta Mura MD   [facility] 10/27/2023, 1:28 PM

## 2023-10-27 NOTE — Progress Notes (Incomplete)
62 year old white male known history EtOH-hemochromatosis-previously follows with Dr. Al Pimple unclear when last phlebotomy-noncompliant with B12 injections  prior colonic polyps no polyps seen on last colonoscopy 2016 Dr. Ezzard Standing Hepatic steatosis B12 folate deficiency Excision of large anal condyloma previously Thyroidectomy previously Hospitalization 10/4-10/14 with episodic syncope hemoglobin 5.9 electrolyte abnormality as well as DTs

## 2023-10-27 NOTE — [place] FL2 (Signed)
[place] MEDICAID FL2 LEVEL OF CARE FORM     IDENTIFICATION  Patient Name: Philip Humphrey Birthdate: [birth year] Sex: male Admission Date (Current Location): 10/24/2023  [facility] and [place] Number:  Producer, television/film/video and Address:  [facility],  [address] [place], [place] [address]      Provider Number: [id number]  Attending Physician Name and Address:  Rhetta Mura, MD  Relative Name and Phone Number:  [name] Powers(dtr)[phone]    Current Level of Care: SNF Recommended Level of Care: Assisted Living Facility Prior Approval Number:    Date Approved/Denied:   PASRR Number: [id number]  Discharge Plan: Other (Comment) (ALF)    Current Diagnoses: Patient Active Problem List   Diagnosis Date Noted   Malnutrition (HCC) 10/25/2023   Frequent falls 10/25/2023   Hepatic cirrhosis (HCC) 10/25/2023   Pain of upper abdomen 10/25/2023   Anemia due to chronic blood loss 09/27/2023   Alcohol withdrawal delirium, acute, mixed level of activity (HCC) 09/27/2023   Heme positive stool 09/26/2023   Macrocytic anemia 09/24/2023   Postural dizziness 07/24/2023   Dizziness 07/23/2023   Leukocytosis 07/23/2023   Acute on chronic anemia 07/23/2023   Chronic idiopathic thrombocytopenia (HCC) 07/23/2023   Hepatic steatosis 07/23/2023   Vitamin B12 deficiency 07/23/2023   GERD (gastroesophageal reflux disease) 07/23/2023   Hypotensive episode 07/23/2023   Postural dizziness with near syncope 07/23/2023   Hypophosphatemia 07/23/2023   Hypomagnesemia 07/23/2023   Syncope 08/10/2022   Orthostatic hypotension 08/10/2022   Diarrhea 08/10/2022   Chronic alcohol use 08/10/2022   Hyperlipidemia 08/10/2022   Peripheral neuropathy 08/10/2022   Fall 04/12/2022   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   AKI (acute kidney injury) (HCC) 03/03/2022   Hyponatremia 03/03/2022   Hypokalemia 03/03/2022   Acute metabolic acidosis 03/03/2022   Elevated LFTs 03/03/2022    Macrocytosis 03/03/2022   Thrombocytopenia (HCC) 03/03/2022   Hereditary hemochromatosis (HCC) 07/17/2021   Alcohol abuse 07/07/2021   Pancreatitis 07/07/2021   Acute kidney injury (HCC) 07/07/2021   Acute pancreatitis 07/07/2021   Status post total hip replacement, 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   Obese 11/25/2016   S/P left THA, AA 11/24/2016   Hypothyroidism 04/12/2012   Essential hypertension 04/12/2012   History of gastritis 04/12/2012   History of tobacco use 04/12/2012   Overweight 04/12/2012    Orientation RESPIRATION BLADDER Height & Weight     Self, Time, Situation, Place  Normal Continent Weight: 77.9 kg Height:  5\' 9"  (175.3 cm)  BEHAVIORAL SYMPTOMS/MOOD NEUROLOGICAL BOWEL NUTRITION STATUS      Continent Diet (Regular)  AMBULATORY STATUS COMMUNICATION OF NEEDS Skin   Limited Assist Verbally Normal                       Personal Care Assistance Level of Assistance  Bathing, Feeding, Dressing Bathing Assistance: Limited assistance Feeding assistance: Limited assistance       Functional Limitations Info  Sight, Hearing, Speech Sight Info: Impaired (eyeglasses)   Speech Info: Adequate    SPECIAL CARE FACTORS FREQUENCY   (HHPT)       OT Frequency:  (HHOT)            Contractures Contractures Info: Not present    Additional Factors Info  Code Status, Allergies, Psychotropic Code Status Info: Full Allergies Info: Sulfa Antibiotics, Oxycodone, Nickel Psychotropic Info: ativan prn         Current Medications (10/27/2023):  This is the current hospital active medication list Current Facility-Administered  Medications  Medication Dose Route Frequency Provider Last Rate Last Admin   enoxaparin (LOVENOX) injection 40 mg  40 mg Subcutaneous Q24H Tu, Ching T, DO   40 mg at 10/27/23 0830   folic acid (FOLVITE) tablet 1 mg  1 mg Oral Daily Achille Rich, PA-C   1 mg at 10/27/23 0830   levothyroxine (SYNTHROID) tablet 175 mcg  175 mcg Oral Q0600 Tu, Ching T, DO    175 mcg at 10/27/23 0546   naphazoline-glycerin (CLEAR EYES REDNESS) ophth solution 1-2 drop  1-2 drop Both Eyes BID Leeroy Bock, MD   2 drop at 10/27/23 0830   sertraline (ZOLOFT) tablet 50 mg  50 mg Oral QHS Tu, Ching T, DO   50 mg at 10/26/23 2110   thiamine (VITAMIN B1) tablet 100 mg  100 mg Oral Daily Achille Rich, PA-C   100 mg at 10/27/23 0830   Or   thiamine (VITAMIN B1) injection 100 mg  100 mg Intravenous Daily Achille Rich, PA-C         Discharge Medications: Please see discharge summary for a list of discharge medications.  Relevant Imaging Results:  Relevant Lab Results:   Additional Information ss#[id number]  Naydene Kamrowski, Olegario Messier, RN

## 2023-10-27 NOTE — TOC Transition Note (Addendum)
Transition of Care [facility]) - CM/SW Discharge Note   Patient Details  Name: Philip Humphrey MRN: [id number] Date of Birth: April 10, 1961  Transition of Care [facility]) CM/SW Contact:  Lanier Clam, RN Phone Number: 10/27/2023, 1:19 PM   Clinical Narrative:  Patient recc for HHPT. Faxed per patient dtr request to [facility]-faxed w/confirmation fl2,H&P,PT/OT eval. Left vm w/dtr Celedonio Miyamoto for call back. Will arrange HHPT/OT in case d/c home. -1:50p-received call back from [name] (dtr) she is aware we are waiting on acceptance from [facility] for ALF-she also understands that if they do not accept she will allow patient to have HHPT/OT @ home & then f/u with her ALF choices. Has own transport home or to ALF. -3:39p-TC [facility] rep [place]-unable to accept patient. TC [name](dtr) left vm for call back. Provided HHC w/[facility]-med mgmnt/HHPT/OT. No further CM needs. -4:41p spoke to dtr about d/c home w/HHC-she agreed but unable to pick up patient-provided taxi voucher-already called [employer] spoke to [place] who gave estimate of cost-gave taxi voucher to nurse. No further CM needs.    Final next level of care: Assisted Living Barriers to Discharge: No Barriers Identified   Patient Goals and CMS Choice CMS Medicare.gov Compare Post Acute Care list provided to:: Patient Represenative (must comment) (Katy(dtr)) Choice offered to / list presented to : Adult Children  Discharge Placement                         Discharge Plan and Services Additional resources added to the After Visit Summary for     Discharge Planning Services: CM Consult Post Acute Care Choice: Home Health                               Social Determinants of Health (SDOH) Interventions SDOH Screenings   Food Insecurity: No Food Insecurity (10/25/2023)  Housing: Low Risk  (10/25/2023)  Transportation Needs: No Transportation Needs (10/25/2023)  Utilities: Not At Risk (10/25/2023)   Depression (PHQ2-9): Low Risk  (09/15/2022)  Tobacco Use: Medium Risk (10/25/2023)     Readmission Risk Interventions    07/26/2023   12:50 PM  Readmission Risk Prevention Plan  Transportation Screening Complete  PCP or Specialist Appt within 5-7 Days Complete  Home Care Screening Complete  Medication Review (RN CM) Complete

## 2023-10-27 NOTE — Progress Notes (Signed)
Pt's daughter unable to pick up patient at hospital tonight. Pt states he has "a key somewhere on a fence for the house" Pt given a cab voucher for d/c to home by TOC . This RN & primary RN expressed their concerns for discharge that per PT" pt was to have 24/7 assist " due to high risk for falls. Orthostatic vitals done as noted. Pt repeatedly asked to sit down while BP was checked standing. Pt states" I am tired, I need to lay down" Pt stated "I am not going home tonight. My daughter is not home. I won't have anyone to help me if I am by myself" Dr Mahala Menghini will leave discharge order in. Patient to leave in am. Call to daughter by this RN. Pt's daughter [name] will be her at 8am to pick him up in the morning.

## 2023-10-28 LAB — CBC WITH DIFFERENTIAL/PLATELET
Abs Immature Granulocytes: 0.12 10*3/uL — ABNORMAL HIGH (ref 0.00–0.07)
Basophils Absolute: 0.1 10*3/uL (ref 0.0–0.1)
Basophils Relative: 0 %
Eosinophils Absolute: 0.7 10*3/uL — ABNORMAL HIGH (ref 0.0–0.5)
Eosinophils Relative: 3 %
HCT: 35.8 % — ABNORMAL LOW (ref 39.0–52.0)
Hemoglobin: 11 g/dL — ABNORMAL LOW (ref 13.0–17.0)
Immature Granulocytes: 1 %
Lymphocytes Relative: 6 %
Lymphs Abs: 1.2 10*3/uL (ref 0.7–4.0)
MCH: 33.6 pg (ref 26.0–34.0)
MCHC: 30.7 g/dL (ref 30.0–36.0)
MCV: 109.5 fL — ABNORMAL HIGH (ref 80.0–100.0)
Monocytes Absolute: 1.2 10*3/uL — ABNORMAL HIGH (ref 0.1–1.0)
Monocytes Relative: 6 %
Neutro Abs: 16.4 10*3/uL — ABNORMAL HIGH (ref 1.7–7.7)
Neutrophils Relative %: 84 %
Platelets: 164 10*3/uL (ref 150–400)
RBC: 3.27 MIL/uL — ABNORMAL LOW (ref 4.22–5.81)
RDW: 18.6 % — ABNORMAL HIGH (ref 11.5–15.5)
WBC: 19.7 10*3/uL — ABNORMAL HIGH (ref 4.0–10.5)
nRBC: 0 % (ref 0.0–0.2)

## 2023-10-28 NOTE — Progress Notes (Signed)
Pt to main entrance via w/c . AVS reviewed w/ patient and daughter, [name]. Daughter verbalized an understanding. No other questions at this time.

## 2023-10-30 LAB — CULTURE, BLOOD (ROUTINE X 2)
Culture: NO GROWTH
Special Requests: ADEQUATE

## 2023-10-30 LAB — VITAMIN B1: Vitamin B1 (Thiamine): 188.6 nmol/L (ref 66.5–200.0)

## 2023-12-13 ENCOUNTER — Encounter: Payer: Self-pay | Admitting: Hematology and Oncology

## 2023-12-13 ENCOUNTER — Emergency Department (HOSPITAL_COMMUNITY): Payer: Medicare Other

## 2023-12-13 ENCOUNTER — Encounter (HOSPITAL_COMMUNITY): Payer: Self-pay

## 2023-12-13 ENCOUNTER — Emergency Department (HOSPITAL_COMMUNITY)
Admission: EM | Admit: 2023-12-13 | Discharge: 2023-12-13 | Disposition: A | Discharge status: Home / Self Care | Payer: Medicare Other | Attending: Emergency Medicine | Admitting: Emergency Medicine

## 2023-12-13 ENCOUNTER — Other Ambulatory Visit (HOSPITAL_COMMUNITY): Payer: Self-pay

## 2023-12-13 DIAGNOSIS — R600 Localized edema: Secondary | ICD-10-CM | POA: Insufficient documentation

## 2023-12-13 DIAGNOSIS — R0602 Shortness of breath: Secondary | ICD-10-CM | POA: Insufficient documentation

## 2023-12-13 DIAGNOSIS — R296 Repeated falls: Secondary | ICD-10-CM | POA: Insufficient documentation

## 2023-12-13 DIAGNOSIS — Z20822 Contact with and (suspected) exposure to covid-19: Secondary | ICD-10-CM | POA: Insufficient documentation

## 2023-12-13 DIAGNOSIS — G629 Polyneuropathy, unspecified: Secondary | ICD-10-CM | POA: Insufficient documentation

## 2023-12-13 DIAGNOSIS — R609 Edema, unspecified: Secondary | ICD-10-CM

## 2023-12-13 LAB — CBC WITH DIFFERENTIAL/PLATELET
Abs Immature Granulocytes: 0.03 10*3/uL (ref 0.00–0.07)
Basophils Absolute: 0.1 10*3/uL (ref 0.0–0.1)
Basophils Relative: 1 %
Eosinophils Absolute: 0.8 10*3/uL — ABNORMAL HIGH (ref 0.0–0.5)
Eosinophils Relative: 8 %
HCT: 30.5 % — ABNORMAL LOW (ref 39.0–52.0)
Hemoglobin: 9.9 g/dL — ABNORMAL LOW (ref 13.0–17.0)
Immature Granulocytes: 0 %
Lymphocytes Relative: 13 %
Lymphs Abs: 1.2 10*3/uL (ref 0.7–4.0)
MCH: 31.9 pg (ref 26.0–34.0)
MCHC: 32.5 g/dL (ref 30.0–36.0)
MCV: 98.4 fL (ref 80.0–100.0)
Monocytes Absolute: 1.2 10*3/uL — ABNORMAL HIGH (ref 0.1–1.0)
Monocytes Relative: 12 %
Neutro Abs: 6.3 10*3/uL (ref 1.7–7.7)
Neutrophils Relative %: 66 %
Platelets: 156 10*3/uL (ref 150–400)
RBC: 3.1 MIL/uL — ABNORMAL LOW (ref 4.22–5.81)
RDW: 14.6 % (ref 11.5–15.5)
WBC: 9.5 10*3/uL (ref 4.0–10.5)
nRBC: 0 % (ref 0.0–0.2)

## 2023-12-13 LAB — RESP PANEL BY RT-PCR (RSV, FLU A&B, COVID)  RVPGX2
Influenza A by PCR: NEGATIVE
Influenza B by PCR: NEGATIVE
Resp Syncytial Virus by PCR: NEGATIVE
SARS Coronavirus 2 by RT PCR: NEGATIVE

## 2023-12-13 LAB — COMPREHENSIVE METABOLIC PANEL
ALT: 17 U/L (ref 0–44)
AST: 23 U/L (ref 15–41)
Albumin: 3 g/dL — ABNORMAL LOW (ref 3.5–5.0)
Alkaline Phosphatase: 83 U/L (ref 38–126)
Anion gap: 4 — ABNORMAL LOW (ref 5–15)
BUN: 9 mg/dL (ref 8–23)
CO2: 27 mmol/L (ref 22–32)
Calcium: 9.1 mg/dL (ref 8.9–10.3)
Chloride: 108 mmol/L (ref 98–111)
Creatinine, Ser: 0.88 mg/dL (ref 0.61–1.24)
GFR, Estimated: >60 mL/min (ref >60)
Glucose, Bld: 97 mg/dL (ref 70–99)
Potassium: 3.7 mmol/L (ref 3.5–5.1)
Sodium: 139 mmol/L (ref 135–145)
Total Bilirubin: 0.7 mg/dL (ref <1.2)
Total Protein: 5.6 g/dL — ABNORMAL LOW (ref 6.5–8.1)

## 2023-12-13 LAB — MAGNESIUM: Magnesium: 1.5 mg/dL — ABNORMAL LOW (ref 1.7–2.4)

## 2023-12-13 LAB — BRAIN NATRIURETIC PEPTIDE: B Natriuretic Peptide: 177.3 pg/mL — ABNORMAL HIGH (ref 0.0–100.0)

## 2023-12-13 MED ORDER — POTASSIUM CHLORIDE CRYS ER 10 MEQ PO TBCR
10.0000 meq | EXTENDED_RELEASE_TABLET | Freq: Every day | ORAL | 0 refills | Status: AC
  Filled 2023-12-13: qty 7, 7d supply, fill #0

## 2023-12-13 MED ORDER — MAGNESIUM SULFATE 2 GM/50ML IV SOLN
2.0000 g | Freq: Once | INTRAVENOUS | Status: AC
Start: 2023-12-13 — End: 2023-12-13
  Administered 2023-12-13: 2 g via INTRAVENOUS
  Filled 2023-12-13: qty 50

## 2023-12-13 MED ORDER — FUROSEMIDE 20 MG PO TABS
20.0000 mg | ORAL_TABLET | Freq: Every day | ORAL | 0 refills | Status: DC
  Filled 2023-12-13: qty 7, 7d supply, fill #0

## 2023-12-13 MED ORDER — GABAPENTIN 300 MG PO CAPS
300.0000 mg | ORAL_CAPSULE | Freq: Once | ORAL | Status: AC
  Administered 2023-12-13: 300 mg via ORAL
  Filled 2023-12-13: qty 1

## 2023-12-13 MED ORDER — GABAPENTIN 300 MG PO CAPS
300.0000 mg | ORAL_CAPSULE | Freq: Three times a day (TID) | ORAL | 0 refills | Status: AC | PRN
  Filled 2023-12-13: qty 21, 7d supply, fill #0

## 2023-12-13 MED ORDER — MAGNESIUM OXIDE 400 MG PO TABS
400.0000 mg | ORAL_TABLET | Freq: Every day | ORAL | 0 refills | Status: AC
  Filled 2023-12-13: qty 7, 7d supply, fill #0

## 2023-12-13 MED ORDER — KETOROLAC TROMETHAMINE 15 MG/ML IJ SOLN
15.0000 mg | Freq: Once | INTRAMUSCULAR | Status: AC
  Administered 2023-12-13: 15 mg via INTRAVENOUS
  Filled 2023-12-13: qty 1

## 2023-12-13 NOTE — Discharge Instructions (Addendum)
You can begin taking the Lasix with the potassium magnesium supplement this afternoon.  This will help you urinate some of the extra water out of your legs.  Try to keep your legs elevated at all times at home, or is much as possible.  You can wrap your legs with compression sleeves or stockings at home.  Follow-up with your primary care provider.

## 2023-12-13 NOTE — ED Notes (Signed)
This paramedic spoke with TOC phamramcy. They are working on his meds now and will bring them down as soon as they are ready.

## 2023-12-13 NOTE — ED Triage Notes (Signed)
Patient arrived from [facility] with increased peripheral edema for several days with some exertional sob. Patient alert and orientred

## 2023-12-13 NOTE — Progress Notes (Signed)
CSW received consult for medication assistance as patient has difficulty walking to access a traditional pharmacy. Patient has traditional Medicare so he is not eligible for [facility] services.  CSW spoke with MD to request he send all prescriptions to [facility] for patient to pick up before discharge.  Edwin Dada, MSW, LCSW Transitions of Care  Clinical Social Worker II [phone]

## 2023-12-13 NOTE — ED Provider Notes (Signed)
[place] EMERGENCY DEPARTMENT AT [facility] Provider Note   CSN: [id number] Arrival date & time: 12/13/23  4098     History  No chief complaint on file.   Philip Humphrey is a 62 y.o. male with a history of alcohol related anemia, gastritis, liver failure, suspected upper GI bleed, hereditary hemochromatosis, presenting to the ED with concern for lower leg swelling.  The patient is in assisted living as he is having ambulatory dysfunction and frequent falls.  He reports for the past 7 days both of his legs are swollen up and are quite painful.  He has neuropathy in his lower legs but says the pain has become unbearable.  He said he had been on gabapentin in the past but taken off of it.  He denies any worsening shortness of breath, says occasionally he will feel short of breath at random times but not persistently and not persistently at night.  He was concerned about possible congestive heart failure because an EMT had mentioned this possibility when he mention leg swelling.  I reviewed his external records.  Patient had an echocardiogram performed 51-month ago in the hospital with a normal EF of 65 to 70%, no significant abnormalities noted,  HPI     Home Medications Prior to Admission medications   Medication Sig Start Date End Date Taking? Authorizing Provider  furosemide (LASIX) 20 MG tablet Take 1 tablet (20 mg total) by mouth daily for 7 days. 12/13/23 12/20/23 Yes Merion Caton, Kermit Balo, MD  gabapentin (NEURONTIN) 300 MG capsule Take 1 capsule (300 mg total) by mouth 3 (three) times daily as needed for up to 21 doses. 12/13/23  Yes Jazzlene Huot, Kermit Balo, MD  magnesium oxide (MAG-OX) 400 MG tablet Take 1 tablet (400 mg total) by mouth daily for 7 days. 12/13/23 12/20/23 Yes Tyreik Delahoussaye, Kermit Balo, MD  potassium chloride (KLOR-CON M) 10 MEQ tablet Take 1 tablet (10 mEq total) by mouth daily for 7 days. 12/13/23 12/20/23 Yes Shawndell Varas, Kermit Balo, MD  amoxicillin-clavulanate  (AUGMENTIN) 875-125 MG tablet Take 1 tablet by mouth 2 (two) times daily. 10/27/23   Rhetta Mura, MD  famotidine (PEPCID) 40 MG tablet Take 1 tablet (40 mg total) by mouth daily as needed for heartburn or indigestion. 10/04/23   Ghimire, Werner Lean, MD  folic acid (FOLVITE) 1 MG tablet Take 1 tablet (1 mg total) by mouth daily. 10/04/23   Ghimire, Werner Lean, MD  MELATONIN PO Take 1 tablet by mouth at bedtime as needed (sleep).    [provider]  midodrine (PROAMATINE) 2.5 MG tablet Take 2 tablets (5 mg total) by mouth 3 (three) times daily. 10/04/23   Ghimire, Werner Lean, MD  nitroGLYCERIN (NITROSTAT) 0.4 MG SL tablet Place 0.4 mg under the tongue every 5 (five) minutes as needed for chest pain.    [provider]  omeprazole (PRILOSEC) 40 MG capsule Take 40 mg by mouth daily. 11/04/23   [provider]  pantoprazole (PROTONIX) 40 MG tablet Take 1 tablet (40 mg total) by mouth 2 (two) times daily. 10/04/23   Ghimire, Werner Lean, MD  sertraline (ZOLOFT) 50 MG tablet Take 1 tablet (50 mg total) by mouth at bedtime. 10/27/23   Rhetta Mura, MD  SYNTHROID 175 MCG tablet Take 1 tablet (175 mcg total) by mouth daily. NO MORE REFILLS WITHOUT OFFICE VISIT - 2ND NOTICE 10/04/23   Ghimire, Werner Lean, MD  thiamine (VITAMIN B-1) 100 MG tablet Take 1 tablet (100 mg total) by mouth daily.  10/04/23   Ghimire, Werner Lean, MD  Vitamin D, Ergocalciferol, (DRISDOL) 1.25 MG (50000 UNIT) CAPS capsule Take 1 capsule (50,000 Units total) by mouth every 7 (seven) days. 10/04/23   Ghimire, Werner Lean, MD      Allergies    Sulfa antibiotics, Oxycodone, and Nickel    Review of Systems   Review of Systems  Physical Exam Updated Vital Signs BP (!) 105/55   Pulse 100   Temp 98.6 F (37 C) (Oral)   Resp 16   Ht 5\' 9"  (1.753 m)   SpO2 100%   BMI 25.35 kg/m  Physical Exam Constitutional:      General: He is not in acute distress. HENT:     Head: Normocephalic and atraumatic.   Eyes:     Conjunctiva/sclera: Conjunctivae normal.     Pupils: Pupils are equal, round, and reactive to light.  Cardiovascular:     Rate and Rhythm: Normal rate and regular rhythm.  Pulmonary:     Effort: Pulmonary effort is normal. No respiratory distress.  Abdominal:     General: There is no distension.     Tenderness: There is no abdominal tenderness.  Musculoskeletal:     Comments: Symmetrical pitting edema of the lower extremities tender to the touch  Skin:    General: Skin is warm and dry.  Neurological:     General: No focal deficit present.     Mental Status: He is alert. Mental status is at baseline.  Psychiatric:        Mood and Affect: Mood normal.        Behavior: Behavior normal.     ED Results / Procedures / Treatments   Labs (all labs ordered are listed, but only abnormal results are displayed) Labs Reviewed  CBC WITH DIFFERENTIAL/PLATELET - Abnormal; Notable for the following components:      Result Value   RBC 3.10 (*)    Hemoglobin 9.9 (*)    HCT 30.5 (*)    Monocytes Absolute 1.2 (*)    Eosinophils Absolute 0.8 (*)    All other components within normal limits  COMPREHENSIVE METABOLIC PANEL - Abnormal; Notable for the following components:   Total Protein 5.6 (*)    Albumin 3.0 (*)    Anion gap 4 (*)    All other components within normal limits  BRAIN NATRIURETIC PEPTIDE - Abnormal; Notable for the following components:   B Natriuretic Peptide 177.3 (*)    All other components within normal limits  MAGNESIUM - Abnormal; Notable for the following components:   Magnesium 1.5 (*)    All other components within normal limits  RESP PANEL BY RT-PCR (RSV, FLU A&B, COVID)  [id number]    EKG None  Radiology DG Chest 2 View Result Date: 12/13/2023 CLINICAL DATA:  Short of breath, peripheral edema for several days EXAM: CHEST - 2 VIEW COMPARISON:  10/24/2023 FINDINGS: Frontal and lateral views of the chest demonstrate an unremarkable cardiac silhouette. No  airspace disease, effusion, or pneumothorax. No acute bony abnormalities. IMPRESSION: 1. No acute intrathoracic process. Electronically Signed   By: Sharlet Salina M.D.   On: 12/13/2023 11:03    Procedures Procedures    Medications Ordered in ED Medications  gabapentin (NEURONTIN) capsule 300 mg (300 mg Oral Given 12/13/23 1107)  ketorolac (TORADOL) 15 MG/ML injection 15 mg (15 mg Intravenous Given 12/13/23 1107)  magnesium sulfate IVPB 2 g 50 mL (0 g Intravenous Stopped 12/13/23 1341)    ED Course/ Medical Decision Making/  A&P                                 Medical Decision Making Amount and/or Complexity of Data Reviewed Labs: ordered. Radiology: ordered. ECG/medicine tests: ordered.  Risk OTC drugs. Prescription drug management.   Patient is presenting to ED with symmetrical lower extremity edema and pain.  This is consistent with peripheral edema.  Due to the symmetry of presentation have a lower suspicion for acute DVT.  No signs or symptoms of PE.  He is breathing comfortably on exam, not hypoxic.  I personally reviewed and interpreted the patient's labs and imaging, notable for Mag 1.5, BNP 177, CMP unremarkable, CBC at baseline.  Covid/flu negative  I reviewed external records as noted below including recent echocardiogram.  Gabapentin given for pain as well as a shot of Toradol; IV magnesium for hypomagnesemia - lower leg pain improved after medications the patient was sleeping in my reassessment.  I was able to prescribe a 7-day supply of medications to the transition of care pharmacy and which was brought to the bedside for his discharge.  He will take a cab home.  We discussed compression at home which she has compression stockings, he prefers to take the Lasix at home, and follow-up with his PCP for his peripheral edema.  At this point there is no indication for hospitalization.  He is stable for discharge  Doubt ACS, sepsis, PE, pneumonia        Final  Clinical Impression(s) / ED Diagnoses Final diagnoses:  Edema, unspecified type  Hypomagnesemia    Rx / DC Orders ED Discharge Orders          Ordered    furosemide (LASIX) 20 MG tablet  Daily        12/13/23 1300    potassium chloride (KLOR-CON M) 10 MEQ tablet  Daily        12/13/23 1300    magnesium oxide (MAG-OX) 400 MG tablet  Daily        12/13/23 1300    gabapentin (NEURONTIN) 300 MG capsule  3 times daily PRN        12/13/23 1300              Terald Sleeper, MD 12/13/23 1421

## 2023-12-13 NOTE — ED Notes (Signed)
Patient transported to X-ray 

## 2023-12-14 ENCOUNTER — Other Ambulatory Visit: Payer: Self-pay

## 2023-12-14 ENCOUNTER — Emergency Department (HOSPITAL_COMMUNITY): Payer: Medicare Other

## 2023-12-14 ENCOUNTER — Emergency Department (HOSPITAL_COMMUNITY)
Admission: EM | Admit: 2023-12-14 | Discharge: 2023-12-15 | Disposition: A | Discharge status: Home / Self Care | Payer: Medicare Other | Attending: Emergency Medicine | Admitting: Emergency Medicine

## 2023-12-14 ENCOUNTER — Encounter (HOSPITAL_COMMUNITY): Payer: Self-pay | Admitting: Emergency Medicine

## 2023-12-14 DIAGNOSIS — I10 Essential (primary) hypertension: Secondary | ICD-10-CM | POA: Diagnosis not present

## 2023-12-14 DIAGNOSIS — R600 Localized edema: Secondary | ICD-10-CM | POA: Diagnosis present

## 2023-12-14 DIAGNOSIS — M7989 Other specified soft tissue disorders: Secondary | ICD-10-CM | POA: Diagnosis not present

## 2023-12-14 LAB — COMPREHENSIVE METABOLIC PANEL
ALT: 18 U/L (ref 0–44)
AST: 27 U/L (ref 15–41)
Albumin: 2.9 g/dL — ABNORMAL LOW (ref 3.5–5.0)
Alkaline Phosphatase: 89 U/L (ref 38–126)
Anion gap: 9 (ref 5–15)
BUN: 13 mg/dL (ref 8–23)
CO2: 24 mmol/L (ref 22–32)
Calcium: 8.9 mg/dL (ref 8.9–10.3)
Chloride: 106 mmol/L (ref 98–111)
Creatinine, Ser: 0.89 mg/dL (ref 0.61–1.24)
GFR, Estimated: >60 mL/min (ref >60)
Glucose, Bld: 101 mg/dL — ABNORMAL HIGH (ref 70–99)
Potassium: 3.7 mmol/L (ref 3.5–5.1)
Sodium: 139 mmol/L (ref 135–145)
Total Bilirubin: 0.3 mg/dL (ref <1.2)
Total Protein: 6 g/dL — ABNORMAL LOW (ref 6.5–8.1)

## 2023-12-14 LAB — CBC
HCT: 30.6 % — ABNORMAL LOW (ref 39.0–52.0)
Hemoglobin: 9.8 g/dL — ABNORMAL LOW (ref 13.0–17.0)
MCH: 32.3 pg (ref 26.0–34.0)
MCHC: 32 g/dL (ref 30.0–36.0)
MCV: 101 fL — ABNORMAL HIGH (ref 80.0–100.0)
Platelets: 142 10*3/uL — ABNORMAL LOW (ref 150–400)
RBC: 3.03 MIL/uL — ABNORMAL LOW (ref 4.22–5.81)
RDW: 14.6 % (ref 11.5–15.5)
WBC: 8.8 10*3/uL (ref 4.0–10.5)
nRBC: 0 % (ref 0.0–0.2)

## 2023-12-14 LAB — BRAIN NATRIURETIC PEPTIDE: B Natriuretic Peptide: 171.9 pg/mL — ABNORMAL HIGH (ref 0.0–100.0)

## 2023-12-14 MED ORDER — FUROSEMIDE 10 MG/ML IJ SOLN
40.0000 mg | Freq: Once | INTRAMUSCULAR | Status: AC
  Administered 2023-12-14: 40 mg via INTRAVENOUS
  Filled 2023-12-14: qty 4

## 2023-12-14 NOTE — ED Triage Notes (Signed)
Pt BIB EMS from [facility] for BIL leg swelling that started 12/18 and has been seen for the same and has only been able to urinate once since he was discharged yesterday. States he now feels tingling in his testicles. Pt states "it is spreading up my leg'.

## 2023-12-14 NOTE — ED Provider Notes (Signed)
[facility] AT [facility] Provider Note   CSN: [id number] Arrival date & time: 12/14/23  2008     History  Chief Complaint  Patient presents with   Leg Swelling    Philip Humphrey is a 62 y.o. male.  HPI   Patient has a history of hypertension thyroid disease, neuropathy, acid reflux, anxiety, chronic alcohol use who presents to the ED for evaluation of leg swelling.  Patient states he has noticed increasing leg swelling over the last week or so.  Patient was in the ED yesterday and states he feels like the symptoms are worse since then.  He denies any chest pain.  He is not feeling short of breath.  He denies any vomiting or diarrhea.  No fevers or chills  Home Medications Prior to Admission medications   Medication Sig Start Date End Date Taking? Authorizing Provider  amoxicillin-clavulanate (AUGMENTIN) 875-125 MG tablet Take 1 tablet by mouth 2 (two) times daily. 10/27/23   Rhetta Mura, MD  famotidine (PEPCID) 40 MG tablet Take 1 tablet (40 mg total) by mouth daily as needed for heartburn or indigestion. 10/04/23   Ghimire, Werner Lean, MD  folic acid (FOLVITE) 1 MG tablet Take 1 tablet (1 mg total) by mouth daily. 10/04/23   Ghimire, Werner Lean, MD  furosemide (LASIX) 20 MG tablet Take 1 tablet (20 mg total) by mouth daily for 7 days. 12/13/23 12/20/23  Terald Sleeper, MD  gabapentin (NEURONTIN) 300 MG capsule Take 1 capsule (300 mg total) by mouth 3 (three) times daily as needed for up to 21 doses. 12/13/23   Terald Sleeper, MD  magnesium oxide (MAG-OX) 400 MG tablet Take 1 tablet (400 mg total) by mouth daily for 7 days. 12/13/23 12/20/23  Terald Sleeper, MD  MELATONIN PO Take 1 tablet by mouth at bedtime as needed (sleep).    [provider]  midodrine (PROAMATINE) 2.5 MG tablet Take 2 tablets (5 mg total) by mouth 3 (three) times daily. 10/04/23   Ghimire, Werner Lean, MD  nitroGLYCERIN (NITROSTAT) 0.4 MG SL tablet Place  0.4 mg under the tongue every 5 (five) minutes as needed for chest pain.    [provider]  omeprazole (PRILOSEC) 40 MG capsule Take 40 mg by mouth daily. 11/04/23   [provider]  pantoprazole (PROTONIX) 40 MG tablet Take 1 tablet (40 mg total) by mouth 2 (two) times daily. 10/04/23   Ghimire, Werner Lean, MD  potassium chloride (KLOR-CON M) 10 MEQ tablet Take 1 tablet (10 mEq total) by mouth daily for 7 days. 12/13/23 12/20/23  Terald Sleeper, MD  sertraline (ZOLOFT) 50 MG tablet Take 1 tablet (50 mg total) by mouth at bedtime. 10/27/23   Rhetta Mura, MD  SYNTHROID 175 MCG tablet Take 1 tablet (175 mcg total) by mouth daily. NO MORE REFILLS WITHOUT OFFICE VISIT - 2ND NOTICE 10/04/23   Ghimire, Werner Lean, MD  thiamine (VITAMIN B-1) 100 MG tablet Take 1 tablet (100 mg total) by mouth daily. 10/04/23   Ghimire, Werner Lean, MD  Vitamin D, Ergocalciferol, (DRISDOL) 1.25 MG (50000 UNIT) CAPS capsule Take 1 capsule (50,000 Units total) by mouth every 7 (seven) days. 10/04/23   Ghimire, Werner Lean, MD      Allergies    Sulfa antibiotics, Oxycodone, and Nickel    Review of Systems   Review of Systems  Physical Exam Updated Vital Signs BP 126/70 (BP Location: Left Arm)   Pulse (!) 109  Temp 99 F (37.2 C) (Oral)   Resp 17   Ht 1.753 m (5\' 9" )   Wt 78.5 kg   SpO2 100%   BMI 25.55 kg/m  Physical Exam Vitals and nursing note reviewed.  Constitutional:      Appearance: He is well-developed. He is not diaphoretic.  HENT:     Head: Normocephalic and atraumatic.     Right Ear: External ear normal.     Left Ear: External ear normal.  Eyes:     General: No scleral icterus.       Right eye: No discharge.        Left eye: No discharge.     Conjunctiva/sclera: Conjunctivae normal.  Neck:     Trachea: No tracheal deviation.  Cardiovascular:     Rate and Rhythm: Normal rate and regular rhythm.  Pulmonary:     Effort: Pulmonary effort is normal. No respiratory  distress.     Breath sounds: Normal breath sounds. No stridor. No wheezing or rales.  Abdominal:     General: Bowel sounds are normal. There is no distension.     Palpations: Abdomen is soft.     Tenderness: There is no abdominal tenderness. There is no guarding or rebound.  Musculoskeletal:        General: No tenderness or deformity.     Cervical back: Neck supple.     Right lower leg: Edema present.     Left lower leg: Edema present.     Comments: Strong dorsalis pedal pulses bilaterally  Skin:    General: Skin is warm and dry.     Findings: No rash.  Neurological:     General: No focal deficit present.     Mental Status: He is alert.     Cranial Nerves: No cranial nerve deficit, dysarthria or facial asymmetry.     Sensory: No sensory deficit.     Motor: No abnormal muscle tone or seizure activity.     Coordination: Coordination normal.  Psychiatric:        Mood and Affect: Mood normal.     ED Results / Procedures / Treatments   Labs (all labs ordered are listed, but only abnormal results are displayed) Labs Reviewed  CBC - Abnormal; Notable for the following components:      Result Value   RBC 3.03 (*)    Hemoglobin 9.8 (*)    HCT 30.6 (*)    MCV 101.0 (*)    Platelets 142 (*)    All other components within normal limits  COMPREHENSIVE METABOLIC PANEL - Abnormal; Notable for the following components:   Glucose, Bld 101 (*)    Total Protein 6.0 (*)    Albumin 2.9 (*)    All other components within normal limits  BRAIN NATRIURETIC PEPTIDE - Abnormal; Notable for the following components:   B Natriuretic Peptide 171.9 (*)    All other components within normal limits    EKG None  Radiology DG Chest 2 View Result Date: 12/14/2023 CLINICAL DATA:  Leg swelling EXAM: CHEST - 2 VIEW COMPARISON:  12/13/2023 FINDINGS: Shallow inspiration. Heart size and pulmonary vascularity are normal for technique. No airspace disease or consolidation in the lungs. Mild degenerative  changes in the spine and shoulders. A metallic foreign body is projected over the left lung base consistent with a staple. This is not seen on the lateral view, likely representing external artifact. IMPRESSION: No active cardiopulmonary disease. Electronically Signed   By: Marisa Cyphers.D.  On: 12/14/2023 21:36   DG Chest 2 View Result Date: 12/13/2023 CLINICAL DATA:  Short of breath, peripheral edema for several days EXAM: CHEST - 2 VIEW COMPARISON:  10/24/2023 FINDINGS: Frontal and lateral views of the chest demonstrate an unremarkable cardiac silhouette. No airspace disease, effusion, or pneumothorax. No acute bony abnormalities. IMPRESSION: 1. No acute intrathoracic process. Electronically Signed   By: Sharlet Salina M.D.   On: 12/13/2023 11:03    Procedures Procedures    Medications Ordered in ED Medications  furosemide (LASIX) injection 40 mg (40 mg Intravenous Given 12/14/23 2227)    ED Course/ Medical Decision Making/ A&P Clinical Course as of 12/15/23 0001  Tue Dec 14, 2023  2214 Chest x-ray without acute findings.  CBC shows stable anemia [JK]  2309 BNP is not elevated.  Metabolic panel is unremarkable. [JK]    Clinical Course User Index [JK] Linwood Dibbles, MD                                 Medical Decision Making Problems Addressed: Peripheral edema: acute illness or injury that poses a threat to life or bodily functions  Amount and/or Complexity of Data Reviewed Labs: ordered. Decision-making details documented in ED Course. Radiology: ordered.  Risk Prescription drug management.   Patient presented to the ED with complaints of persistent peripheral edema.  Patient has notable edema in his lower extremities.  Fortunately does not have any evidence of pulmonary edema.  He does not have any respiratory symptoms.  Patient was given Lasix in the ED.  He had significant urinary output.  Will try to find compression stockings for the patient as he states he is not  sure when he can get to a pharmacy to pick them up.  No signs of infection.  Low suspicion for DVT.  Patient respond to diuretics.  Evaluation and diagnostic testing in the emergency department does not suggest an emergent condition requiring admission or immediate intervention beyond what has been performed at this time.  The patient is safe for discharge and has been instructed to return immediately for worsening symptoms, change in symptoms or any other concerns.       Final Clinical Impression(s) / ED Diagnoses Final diagnoses:  Peripheral edema    Rx / DC Orders ED Discharge Orders     None         Linwood Dibbles, MD 12/15/23 0003

## 2023-12-15 NOTE — Discharge Instructions (Signed)
Take the Lasix that you were prescribed the other day.  Use the compression stockings to help with the swelling in your leg.  Follow-up with your primary care doctor to be rechecked.

## 2024-03-27 ENCOUNTER — Other Ambulatory Visit: Payer: Self-pay

## 2024-03-27 DIAGNOSIS — I872 Venous insufficiency (chronic) (peripheral): Secondary | ICD-10-CM

## 2024-04-04 ENCOUNTER — Ambulatory Visit (HOSPITAL_COMMUNITY)
Admission: RE | Admit: 2024-04-04 | Discharge: 2024-04-04 | Disposition: A | Discharge status: Home / Self Care | Source: Ambulatory Visit | Attending: Vascular Surgery | Admitting: Vascular Surgery

## 2024-04-04 DIAGNOSIS — I872 Venous insufficiency (chronic) (peripheral): Secondary | ICD-10-CM | POA: Diagnosis present

## 2024-04-13 ENCOUNTER — Encounter: Payer: Self-pay | Admitting: Radiology

## 2024-04-19 ENCOUNTER — Encounter: Payer: Self-pay | Admitting: Physician Assistant

## 2024-04-19 ENCOUNTER — Ambulatory Visit: Attending: Vascular Surgery | Admitting: Physician Assistant

## 2024-04-19 VITALS — BP 120/79 | HR 76 | Temp 98.2°F | Wt 233.5 lb

## 2024-04-19 DIAGNOSIS — R600 Localized edema: Secondary | ICD-10-CM | POA: Diagnosis present

## 2024-04-19 NOTE — Progress Notes (Signed)
 Requested by:  Chares Commons, PA-C [address] [place],  [place] [address]  Reason for consultation: lower extremity edema with history of weeping    History of Present Illness   Philip Humphrey is a 63 y.o. (22-Apr-1961) male who presents for evaluation of lower extremity edema. He presents with his daughter today. He explains that he started having swelling in December of 2024. This has been intermittent flares. He has been seen in ED on several occasions for evaluation of this. On workup he has had normal lab evaluations, normal EF, normal renal function. He says at times he has experienced all over swelling not just in his legs. His last sort of flare left him with a rash all over, his skin on his legs is still dry and itching but improving. He has also been seen by Dermatologist for this. He is using steroid cream to help with rash. He denies any pain in his legs. He has had tightness/ stiffness. He also has had tingling in his legs but has known neuropathy. He also reports some knee and hip pain but has had injuries and surgeries on these. He does elevate his legs regularly with minimal improvement of swelling. He also has tried compression stockings but he says at times he could not get them on due to the swelling and at other times he has felt like it just pushes the swelling up to his knees and thighs. He lives in an assisted living community and stays relatively active. He is a retired Museum/gallery exhibitions officer. He has no history of DVT or family history.   Venous symptoms include: itching, swelling Onset/duration:  5 months  Occupation:  retired Aggravating factors: sitting, standing, prolonged ambulation Alleviating factors: elevation Compression:  yes Helps:  somewhat Pain medications:  no Previous vein procedures:  no History of DVT:  No  Past Medical History:  Diagnosis Date   Allergy    occ. seasonal   Anxiety    Arthritis    Colon polyp    Condyloma    anal   Depression     GERD (gastroesophageal reflux disease)    History of transfusion    as child   Hypertension    Knee injury    right knee cap w piece broken off- MRI done 02-06-15-pending surgery. 11-18-16 right knee is still painful, both shoulders(limited ROM right).   Knee pain    Neuropathy    bilateral- greater left   Palpitations    normal stress test 10 yrs ago   Pneumonia    Thyroid  disease    Thyroid  removed unintentionally at age 27- no further thyroid  tissue remains-uses daily supplement    Past Surgical History:  Procedure Laterality Date   COLONOSCOPY     COLONOSCOPY N/A 02/15/2015   Procedure: COLONOSCOPY;  Surgeon: Juanita Norlander, MD;  Location: [facility];  Service: General;  Laterality: N/A;   cyst removed     from neck / abdomed   EXAMINATION UNDER ANESTHESIA N/A 02/15/2015   Procedure: EXAM UNDER ANESTHESIA;  Surgeon: Azucena Bollard, MD;  Location: [facility];  Service: General;  Laterality: N/A;   HERNIA REPAIR Left    10 yrs ago   KNEE ARTHROSCOPY Left    x1 and meniscal tear    ROTATOR CUFF REPAIR Right    x1   SHOULDER ARTHROSCOPY W/ ROTATOR CUFF REPAIR Left    THYROIDECTOMY  age 57    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 Bernard Brick 08/03/17   TOTAL HIP ARTHROPLASTY Right 08/03/2017   Procedure: RIGHT TOTAL HIP ARTHROPLASTY ANTERIOR APPROACH;  Surgeon: Claiborne Crew, MD;  Location: [facility];  Service: Orthopedics;  Laterality: Right;  70 mins   UMBILICAL HERNIA REPAIR N/A 10/18/2017   Procedure: Umbilical hernia repair  and excision of lipomas;  Surgeon: Jacolyn Matar, MD;  Location: [facility];  Service: General;  Laterality: N/A;   WART FULGURATION N/A 02/15/2015   Procedure: REMOVAL OF ANAL TAGS,CONDYLOMA;  Surgeon: Azucena Bollard, MD;  Location: [facility];  Service: General;  Laterality: N/A;   WRIST  SURGERY     x2- left (repair tendon/ ligament)    Social History   Socioeconomic History   Marital status: Single    Spouse name: Not on file   Number of children: Not on file   Years of education: Not on file   Highest education level: Not on file  Occupational History   Not on file  Tobacco Use   Smoking status: Former    Types: Cigars   Smokeless tobacco: Never   Tobacco comments:    occasional quit 2012  Vaping Use   Vaping status: Never Used  Substance and Sexual Activity   Alcohol use: Yes    Comment: drinking liquor each night recently   Drug use: No   Sexual activity: Yes  Other Topics Concern   Not on file  Social History Narrative   Not on file   Social Drivers of Health   Financial Resource Strain: Not on file  Food Insecurity: No Food Insecurity (10/25/2023)   Hunger Vital Sign    Worried About Running Out of Food in the Last Year: Never true    Ran Out of Food in the Last Year: Never true  Transportation Needs: No Transportation Needs (10/25/2023)   PRAPARE - Administrator, Civil Service (Medical): No    Lack of Transportation (Non-Medical): No  Physical Activity: Not on file  Stress: Not on file  Social Connections: Not on file  Intimate Partner Violence: Not At Risk (10/25/2023)   Humiliation, Afraid, Rape, and Kick questionnaire    Fear of Current or Ex-Partner: No    Emotionally Abused: No    Physically Abused: No    Sexually Abused: No    Family History  Problem Relation Age of Onset   Heart disease Mother    Cancer Brother        Lung   Lung disease Brother    Stroke Father     Current Outpatient Medications  Medication Sig Dispense Refill   famotidine  (PEPCID ) 40 MG tablet Take 1 tablet (40 mg total) by mouth daily as needed for heartburn or indigestion. 30 tablet 0   folic acid  (FOLVITE ) 1 MG tablet Take 1 tablet (1 mg total) by mouth daily. 30 tablet 0   gabapentin  (NEURONTIN ) 300 MG capsule Take 1 capsule (300 mg  total) by mouth 3 (three) times daily as needed for up to 21 doses. 21 capsule 0   MELATONIN PO Take 1 tablet by mouth at bedtime as needed (sleep).     midodrine  (PROAMATINE ) 2.5 MG tablet Take 2 tablets (5 mg total) by mouth 3 (three) times daily. 90 tablet 0   pantoprazole  (PROTONIX ) 40 MG tablet Take 1 tablet (40  mg total) by mouth 2 (two) times daily.     sertraline  (ZOLOFT ) 50 MG tablet Take 1 tablet (50 mg total) by mouth at bedtime. 30 tablet 0   SYNTHROID  175 MCG tablet Take 1 tablet (175 mcg total) by mouth daily. NO MORE REFILLS WITHOUT OFFICE VISIT - 2ND NOTICE 30 tablet 0   thiamine  (VITAMIN B-1) 100 MG tablet Take 1 tablet (100 mg total) by mouth daily.     torsemide (DEMADEX) 10 MG tablet Take 10 mg by mouth daily.     Vitamin D , Ergocalciferol , (DRISDOL ) 1.25 MG (50000 UNIT) CAPS capsule Take 1 capsule (50,000 Units total) by mouth every 7 (seven) days. 5 capsule 0   nitroGLYCERIN  (NITROSTAT ) 0.4 MG SL tablet Place 0.4 mg under the tongue every 5 (five) minutes as needed for chest pain. (Patient not taking: Reported on 04/19/2024)     omeprazole  (PRILOSEC) 40 MG capsule Take 40 mg by mouth daily.     No current facility-administered medications for this visit.    Allergies  Allergen Reactions   Sulfa Antibiotics Anaphylaxis, Swelling and Other (See Comments)    Mouth and throat swelling   Oxycodone Itching   Nickel Rash and Other (See Comments)    Caused cold sores    REVIEW OF SYSTEMS (negative unless checked):   Cardiac:  []  Chest pain or chest pressure? []  Shortness of breath upon activity? []  Shortness of breath when lying flat? []  Irregular heart rhythm?  Vascular:  []  Pain in calf, thigh, or hip brought on by walking? []  Pain in feet at night that wakes you up from your sleep? []  Blood clot in your veins? [x]  Leg swelling?  Pulmonary:  []  Oxygen at home? []  Productive cough? []  Wheezing?  Neurologic:  []  Sudden weakness in arms or legs? []  Sudden  numbness in arms or legs? []  Sudden onset of difficult speaking or slurred speech? []  Temporary loss of vision in one eye? []  Problems with dizziness?  Gastrointestinal:  []  Blood in stool? []  Vomited blood?  Genitourinary:  []  Burning when urinating? []  Blood in urine?  Psychiatric:  []  Major depression  Hematologic:  []  Bleeding problems? []  Problems with blood clotting?  Dermatologic:  []  Rashes or ulcers?  Constitutional:  []  Fever or chills?  Ear/Nose/Throat:  []  Change in hearing? []  Nose bleeds? []  Sore throat?  Musculoskeletal:  []  Back pain? []  Joint pain? []  Muscle pain?   Physical Examination     Vitals:   04/19/24 0838  BP: 120/79  Pulse: 76  Temp: 98.2 F (36.8 C)  TempSrc: Temporal  SpO2: 96%  Weight: 233 lb 8 oz (105.9 kg)   Body mass index is 34.48 kg/m.  General:  WDWN in NAD; vital signs documented above Gait: Normal HENT: WNL, normocephalic Pulmonary: normal non-labored breathing Cardiac: regular HR Abdomen: soft Vascular Exam/Pulses:  Extremities: without varicose veins, without reticular veins, with minimal edema, without stasis pigmentation, with lipodermatosclerosis, without ulcers Musculoskeletal: no muscle wasting or atrophy  Neurologic: A&O X 3;  No focal weakness or paresthesias are detected Psychiatric:  The pt has Normal affect.  Non-invasive Vascular Imaging   BLE Venous Insufficiency Duplex (04/04/24):  RLE:  No DVT and SVT No GSV reflux GSV diameter 0.37-0.64 cm No SSV reflux  No deep venous reflux  Medical Decision Making   Philip Humphrey is a 63 y.o. male who presents with bilateral lower extremity edema. It is presently improved. I do not suspect lymphedema based on his clinical exam.  He also has experienced all over swelling which is not typical for lymphedema or venous insufficiency. Today's duplex shows normal competent veins throughout his lower RLE extremity. No DVT or SVT. No deep or GSV reflux.  No SSV reflux. Based on these findings he would not be a candidate for venous ablation Based on the patient's history and examination, I recommend: daily elevation of 20-30 minutes above level of heart, daily compression stocking use, exercise, weight reduction, refraining from prolonged sitting or standing. Discussed with patient and his daughter that his swelling may be related to some of his medications. Recommend he follow up with PCP for further evaluation He can follow up as needed if he has any new or concerning symptoms  Deneen Finical, PA-C [facility] Office: [phone]  04/19/2024, 9:13 AM  Clinic MD: Vikki Graves
